# Patient Record
Sex: Male | Born: 1985 | Race: Black or African American | Hispanic: No | Marital: Single | Smoking: Former smoker
Health system: Southern US, Community
[De-identification: ages and names within clinical notes are randomized; demographics above are authoritative.]

## PROBLEM LIST (undated history)

## (undated) DIAGNOSIS — F209 Schizophrenia, unspecified: Secondary | ICD-10-CM

## (undated) DIAGNOSIS — Z21 Asymptomatic human immunodeficiency virus [HIV] infection status: Secondary | ICD-10-CM

## (undated) DIAGNOSIS — B20 Human immunodeficiency virus [HIV] disease: Secondary | ICD-10-CM

---

## 2017-03-07 ENCOUNTER — Emergency Department
Admission: EM | Admit: 2017-03-07 | Discharge: 2017-03-08 | Discharge status: Against Medical Advice | Payer: Self-pay | Attending: Emergency Medicine | Admitting: Emergency Medicine

## 2017-03-07 ENCOUNTER — Encounter: Payer: Self-pay | Admitting: Emergency Medicine

## 2017-03-07 DIAGNOSIS — M6282 Rhabdomyolysis: Secondary | ICD-10-CM | POA: Insufficient documentation

## 2017-03-07 DIAGNOSIS — Z5320 Procedure and treatment not carried out because of patient's decision for unspecified reasons: Secondary | ICD-10-CM | POA: Insufficient documentation

## 2017-03-07 DIAGNOSIS — F17200 Nicotine dependence, unspecified, uncomplicated: Secondary | ICD-10-CM | POA: Insufficient documentation

## 2017-03-07 DIAGNOSIS — B20 Human immunodeficiency virus [HIV] disease: Secondary | ICD-10-CM | POA: Insufficient documentation

## 2017-03-07 HISTORY — DX: Asymptomatic human immunodeficiency virus (hiv) infection status: Z21

## 2017-03-07 HISTORY — DX: Human immunodeficiency virus (HIV) disease: B20

## 2017-03-07 MED ORDER — SODIUM CHLORIDE 0.9 % IV BOLUS (SEPSIS)
1000.0000 mL | Freq: Once | INTRAVENOUS | Status: AC
  Administered 2017-03-08: 1000 mL via INTRAVENOUS

## 2017-03-07 MED ORDER — AMOXICILLIN-POT CLAVULANATE 875-125 MG PO TABS
1.0000 | ORAL_TABLET | Freq: Once | ORAL | Status: AC
  Administered 2017-03-08: 1 via ORAL
  Filled 2017-03-07: qty 1

## 2017-03-07 NOTE — ED Notes (Signed)
ED Provider at bedside. 

## 2017-03-07 NOTE — ED Triage Notes (Signed)
Pt reports he smoked meth on Friday for the first time and since has developed a burn to the right side of upper lip, feeling of increased anxiety and pt sts, "Im just embarrassed of what I did and I am just tripping." Pt reports his "go-to" drugs are marijuana, cocaine and ecstasy. Pt is A&O x4 in triage with steady gait, no SOB or chest pain reported. Pt has swelling tot he right upper lip with white patches.

## 2017-03-08 ENCOUNTER — Encounter: Payer: Self-pay | Admitting: *Deleted

## 2017-03-08 LAB — COMPREHENSIVE METABOLIC PANEL
ALBUMIN: 4.1 g/dL (ref 3.5–5.0)
ALT: 32 U/L (ref 17–63)
ANION GAP: 14 (ref 5–15)
AST: 65 U/L — ABNORMAL HIGH (ref 15–41)
Alkaline Phosphatase: 65 U/L (ref 38–126)
BUN: 27 mg/dL — ABNORMAL HIGH (ref 6–20)
CHLORIDE: 90 mmol/L — AB (ref 101–111)
CO2: 26 mmol/L (ref 22–32)
Calcium: 8.8 mg/dL — ABNORMAL LOW (ref 8.9–10.3)
Creatinine, Ser: 1.6 mg/dL — ABNORMAL HIGH (ref 0.61–1.24)
GFR calc non Af Amer: 57 mL/min — ABNORMAL LOW (ref >60)
GLUCOSE: 79 mg/dL (ref 65–99)
Potassium: 2.9 mmol/L — ABNORMAL LOW (ref 3.5–5.1)
SODIUM: 130 mmol/L — AB (ref 135–145)
Total Bilirubin: 1 mg/dL (ref 0.3–1.2)
Total Protein: 9.7 g/dL — ABNORMAL HIGH (ref 6.5–8.1)

## 2017-03-08 LAB — URINALYSIS, COMPLETE (UACMP) WITH MICROSCOPIC
BACTERIA UA: NONE SEEN
BILIRUBIN URINE: NEGATIVE
GLUCOSE, UA: NEGATIVE mg/dL
Ketones, ur: 20 mg/dL — AB
LEUKOCYTES UA: NEGATIVE
NITRITE: NEGATIVE
PROTEIN: 100 mg/dL — AB
Specific Gravity, Urine: 1.024 (ref 1.005–1.030)
pH: 5 (ref 5.0–8.0)

## 2017-03-08 LAB — CBC
HCT: 44.1 % (ref 40.0–52.0)
Hemoglobin: 14.8 g/dL (ref 13.0–18.0)
MCH: 28.2 pg (ref 26.0–34.0)
MCHC: 33.4 g/dL (ref 32.0–36.0)
MCV: 84.2 fL (ref 80.0–100.0)
PLATELETS: 174 10*3/uL (ref 150–440)
RBC: 5.24 MIL/uL (ref 4.40–5.90)
RDW: 13 % (ref 11.5–14.5)
WBC: 7.7 10*3/uL (ref 3.8–10.6)

## 2017-03-08 LAB — URINE DRUG SCREEN, QUALITATIVE (ARMC ONLY)
AMPHETAMINES, UR SCREEN: POSITIVE — AB
Barbiturates, Ur Screen: NOT DETECTED
Benzodiazepine, Ur Scrn: NOT DETECTED
COCAINE METABOLITE, UR ~~LOC~~: NOT DETECTED
Cannabinoid 50 Ng, Ur ~~LOC~~: POSITIVE — AB
MDMA (ECSTASY) UR SCREEN: NOT DETECTED
METHADONE SCREEN, URINE: NOT DETECTED
Opiate, Ur Screen: NOT DETECTED
Phencyclidine (PCP) Ur S: NOT DETECTED
TRICYCLIC, UR SCREEN: NOT DETECTED

## 2017-03-08 LAB — CK
CK TOTAL: 1187 U/L — AB (ref 49–397)
Total CK: 1689 U/L — ABNORMAL HIGH (ref 49–397)

## 2017-03-08 MED ORDER — SODIUM CHLORIDE 0.9 % IV BOLUS (SEPSIS)
2000.0000 mL | Freq: Once | INTRAVENOUS | Status: AC
  Administered 2017-03-08: 2000 mL via INTRAVENOUS

## 2017-03-08 MED ORDER — POTASSIUM CHLORIDE 20 MEQ PO PACK
40.0000 meq | PACK | Freq: Two times a day (BID) | ORAL | Status: DC
  Administered 2017-03-08: 40 meq via ORAL
  Filled 2017-03-08: qty 2

## 2017-03-08 MED ORDER — AMOXICILLIN-POT CLAVULANATE 875-125 MG PO TABS: 1.0000 | ORAL_TABLET | Freq: Two times a day (BID) | ORAL | 0 refills | Status: AC

## 2017-03-08 NOTE — ED Notes (Signed)
This RN in with Dr Manson PasseyBrown to see pt. We are attempting to have pt stay. Pt wants mother in the room. This RN runs to the parking lot to bring mom in to help persuade pt to stay. Pt continues to state he wants to leave. Agrees to let me put 2 more liters in his IV on a pressure bag and then redraw his blood.

## 2017-03-08 NOTE — ED Provider Notes (Signed)
[facility] Emergency Department Provider Note    First MD Initiated Contact with Patient 03/07/17 2326     (approximate)  I have reviewed the triage vital signs and the nursing notes.   HISTORY  Chief Complaint Anxiety    HPI Michael Chandler is a 31 y.o. male with history of HIV and polysubstance abuse presents to the emergency department with generalized muscle aches, burn to the upper right lip, feelings of anxiety.  Patient states symptoms began after smoking methamphetamines as well as IV injection of methamphetamines.  Patient admits to nausea and vomiting with inability to tolerate eating or drinking.  Patient denies any abdominal discomfort or diarrhea.  Patient denies any fever afebrile on presentation temperature 98   Past Medical History:  Diagnosis Date  . HIV (human immunodeficiency virus infection) (HCC)     There are no active problems to display for this patient.  Past surgical history None  Prior to Admission medications   Not on File    Allergies No known drug allergies History reviewed. No pertinent family history.  Social History Social History   Tobacco Use  . Smoking status: Current Every Day Smoker  . Smokeless tobacco: Never Used  Substance Use Topics  . Alcohol use: Yes  . Drug use: Yes    Types: Methamphetamines, Marijuana, Cocaine    Review of Systems Constitutional: No fever/chills Eyes: No visual changes. ENT: No sore throat. Cardiovascular: Denies chest pain. Respiratory: Denies shortness of breath. Gastrointestinal: No abdominal pain.  No nausea, no vomiting.  No diarrhea.  No constipation. Genitourinary: Negative for dysuria. Musculoskeletal: Positive for generalized muscle aches Integumentary: Negative for rash.  Positive for burn to the upper lip Neurological: Negative for headaches, focal weakness or numbness.  ____________________________________________   PHYSICAL EXAM:  VITAL SIGNS: ED  Triage Vitals  Enc Vitals Group     BP 03/07/17 2055 137/84     Pulse Rate 03/07/17 2055 100     Resp 03/07/17 2055 17     Temp 03/07/17 2055 98 F (36.7 C)     Temp Source 03/07/17 2055 Oral     SpO2 03/07/17 2055 100 %     Weight 03/07/17 2056 91.4 kg (201 lb 9.6 oz)     Height 03/07/17 2056 1.93 m (6\' 4" )     Head Circumference --      Peak Flow --      Pain Score --      Pain Loc --      Pain Edu? --      Excl. in GC? --     Constitutional: Alert and oriented. Well appearing and in no acute distress. Eyes: Conjunctivae are normal.  Head: Atraumatic. Mouth/Throat: First-degree burn noted to the right side of the upper lip oropharynx non-erythematous. Neck: No stridor.   Cardiovascular: Normal rate, regular rhythm. Good peripheral circulation. Grossly normal heart sounds. Respiratory: Normal respiratory effort.  No retractions. Lungs CTAB. Gastrointestinal: Soft and nontender. No distention.  Musculoskeletal: No lower extremity tenderness nor edema. No gross deformities of extremities. Neurologic:  Normal speech and language. No gross focal neurologic deficits are appreciated.  Skin:  Skin is warm, dry and intact. No rash noted. Psychiatric: Mood and affect are normal. Speech and behavior are normal.  ____________________________________________   LABS (all labs ordered are listed, but only abnormal results are displayed)  Labs Reviewed  COMPREHENSIVE METABOLIC PANEL - Abnormal; Notable for the following components:      Result Value  Sodium 130 (*)    Potassium 2.9 (*)    Chloride 90 (*)    BUN 27 (*)    Creatinine, Ser 1.60 (*)    Calcium 8.8 (*)    Total Protein 9.7 (*)    AST 65 (*)    GFR calc non Af Amer 57 (*)    All other components within normal limits  CK - Abnormal; Notable for the following components:   Total CK 1,689 (*)    All other components within normal limits  URINE DRUG SCREEN, QUALITATIVE ([facility] ONLY) - Abnormal; Notable for the following  components:   Amphetamines, Ur Screen POSITIVE (*)    Cannabinoid 50 Ng, Ur [place] POSITIVE (*)    All other components within normal limits  URINALYSIS, COMPLETE (UACMP) WITH MICROSCOPIC - Abnormal; Notable for the following components:   Color, Urine AMBER (*)    APPearance CLOUDY (*)    Hgb urine dipstick SMALL (*)    Ketones, ur 20 (*)    Protein, ur 100 (*)    Squamous Epithelial / LPF 0-5 (*)    All other components within normal limits  CK - Abnormal; Notable for the following components:   Total CK 1,187 (*)    All other components within normal limits  CBC     Procedures   ____________________________________________   INITIAL IMPRESSION / ASSESSMENT AND PLAN / ED COURSE  As part of my medical decision making, I reviewed the following data within the electronic MEDICAL RECORD [id number] year old male presented with above-stated history and physical exam.  Concern for possible rhabdomyolysis given onset of symptoms after IV methamphetamine use.  As such laboratory data including CK was obtained which revealed a CK of 1689.  Patient's creatinine 1.60.  I spoke with the patient and his mother at length regarding all clinical findings.  Patient refuses to be admitted to the hospital and as such 4 L of IV normal saline was given in the emergency department with repeat creatinine kinase level obtained which was 1187.  I spoke with the patient at length in the presence of his mother (patient gave permission) and informed him at length the need for hospital admission and the potential risk if he were to leave AGAINST MEDICAL ADVICE.  Patient adamantly refused to be admitted and requested to leave AGAINST MEDICAL ADVICE.     ____________________________________________  FINAL CLINICAL IMPRESSION(S) / ED DIAGNOSES  Final diagnoses:  Non-traumatic rhabdomyolysis     MEDICATIONS GIVEN DURING THIS VISIT:  Medications  potassium chloride (KLOR-CON) packet 40 mEq (40 mEq Oral Given 03/08/17  0228)  sodium chloride 0.9 % bolus 1,000 mL (0 mLs Intravenous Stopped 03/08/17 0220)  sodium chloride 0.9 % bolus 1,000 mL (0 mLs Intravenous Stopped 03/08/17 0040)  amoxicillin-clavulanate (AUGMENTIN) 875-125 MG per tablet 1 tablet (1 tablet Oral Given 03/08/17 0014)  sodium chloride 0.9 % bolus 2,000 mL (2,000 mLs Intravenous New Bag/Given 03/08/17 0150)     ED Discharge Orders    None       Note:  This document was prepared using Dragon voice recognition software and may include unintentional dictation errors.    Darci CurrentBrown, Norge N, MD 03/08/17 [phone]

## 2017-03-08 NOTE — ED Notes (Signed)
Pt told by this RN and Dr Manson PasseyBrown all the risks leaving AMA. Told pt he should be admitted to hospital. Pt verbalizes understanding the risks for leaving AMA. Mother present. She is unable to persuade him as well. Pt continues to state he wants to leave AMA.

## 2021-09-29 ENCOUNTER — Other Ambulatory Visit: Payer: Self-pay

## 2021-09-29 ENCOUNTER — Emergency Department: Payer: Self-pay

## 2021-09-29 ENCOUNTER — Emergency Department
Admission: EM | Admit: 2021-09-29 | Discharge: 2021-09-30 | Disposition: A | Discharge status: Home / Self Care | Payer: Self-pay | Attending: Emergency Medicine | Admitting: Emergency Medicine

## 2021-09-29 DIAGNOSIS — F209 Schizophrenia, unspecified: Secondary | ICD-10-CM | POA: Insufficient documentation

## 2021-09-29 DIAGNOSIS — E876 Hypokalemia: Secondary | ICD-10-CM

## 2021-09-29 DIAGNOSIS — M542 Cervicalgia: Secondary | ICD-10-CM | POA: Insufficient documentation

## 2021-09-29 DIAGNOSIS — B20 Human immunodeficiency virus [HIV] disease: Secondary | ICD-10-CM

## 2021-09-29 DIAGNOSIS — M5126 Other intervertebral disc displacement, lumbar region: Secondary | ICD-10-CM

## 2021-09-29 DIAGNOSIS — Z21 Asymptomatic human immunodeficiency virus [HIV] infection status: Secondary | ICD-10-CM | POA: Insufficient documentation

## 2021-09-29 DIAGNOSIS — F319 Bipolar disorder, unspecified: Secondary | ICD-10-CM | POA: Insufficient documentation

## 2021-09-29 LAB — CBC WITH DIFFERENTIAL/PLATELET
Abs Immature Granulocytes: 0.04 10*3/uL (ref 0.00–0.07)
Basophils Absolute: 0 10*3/uL (ref 0.0–0.1)
Basophils Relative: 0 %
Eosinophils Absolute: 0.1 10*3/uL (ref 0.0–0.5)
Eosinophils Relative: 2 %
HCT: 35.8 % — ABNORMAL LOW (ref 39.0–52.0)
Hemoglobin: 11.2 g/dL — ABNORMAL LOW (ref 13.0–17.0)
Immature Granulocytes: 1 %
Lymphocytes Relative: 20 %
Lymphs Abs: 1.1 10*3/uL (ref 0.7–4.0)
MCH: 26.7 pg (ref 26.0–34.0)
MCHC: 31.3 g/dL (ref 30.0–36.0)
MCV: 85.4 fL (ref 80.0–100.0)
Monocytes Absolute: 0.8 10*3/uL (ref 0.1–1.0)
Monocytes Relative: 15 %
Neutro Abs: 3.4 10*3/uL (ref 1.7–7.7)
Neutrophils Relative %: 62 %
Platelets: 290 10*3/uL (ref 150–400)
RBC: 4.19 MIL/uL — ABNORMAL LOW (ref 4.22–5.81)
RDW: 14.5 % (ref 11.5–15.5)
WBC: 5.4 10*3/uL (ref 4.0–10.5)
nRBC: 0 % (ref 0.0–0.2)

## 2021-09-29 LAB — COMPREHENSIVE METABOLIC PANEL
ALT: 21 U/L (ref 0–44)
AST: 29 U/L (ref 15–41)
Albumin: 3.5 g/dL (ref 3.5–5.0)
Alkaline Phosphatase: 102 U/L (ref 38–126)
Anion gap: 6 (ref 5–15)
BUN: 11 mg/dL (ref 6–20)
CO2: 24 mmol/L (ref 22–32)
Calcium: 8.7 mg/dL — ABNORMAL LOW (ref 8.9–10.3)
Chloride: 107 mmol/L (ref 98–111)
Creatinine, Ser: 1 mg/dL (ref 0.61–1.24)
GFR, Estimated: >60 mL/min (ref >60)
Glucose, Bld: 108 mg/dL — ABNORMAL HIGH (ref 70–99)
Potassium: 3 mmol/L — ABNORMAL LOW (ref 3.5–5.1)
Sodium: 137 mmol/L (ref 135–145)
Total Bilirubin: 0.8 mg/dL (ref 0.3–1.2)
Total Protein: 7.8 g/dL (ref 6.5–8.1)

## 2021-09-29 LAB — PROCALCITONIN: Procalcitonin: <0.1 ng/mL

## 2021-09-29 LAB — SEDIMENTATION RATE: Sed Rate: 52 mm/hr — ABNORMAL HIGH (ref 0–15)

## 2021-09-29 IMAGING — MR MR THORACIC SPINE WO/W CM
7 of 10 series · 27 of 48 positions shown · IV contrast (9ml Gadavist)
Comparison: None Available.

CLINICAL DATA: Altered mental status.  History of epidural abscess.

EXAM:
MRI CERVICAL, THORACIC AND LUMBAR SPINE WITHOUT AND WITH CONTRAST
TECHNIQUE: Multiplanar and multiecho pulse sequences of the cervical spine, to
include the craniocervical junction and cervicothoracic junction,
and thoracic and lumbar spine, were obtained without and with
intravenous contrast.
CONTRAST:  9mL GADAVIST GADOBUTROL 1 MMOL/ML IV SOLN

[Series 45: T1 · sagittal · 5.0mm · 1.88mm/px · 1 of 9 slices shown (1 of 3)]
[im 1/9]
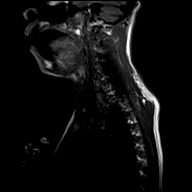

[Series 47: T2 · sagittal · 3.0mm · 1.12mm/px · 3 of 19 slices shown (1 of 2)]
[im 1/19]
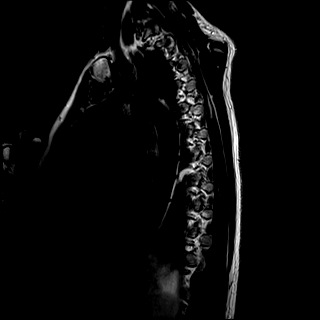
[im 10/19]
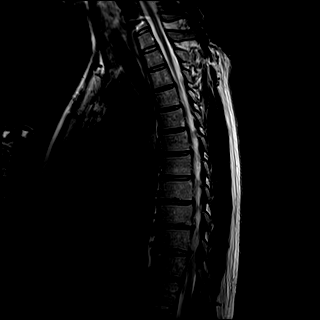
[im 19/19]
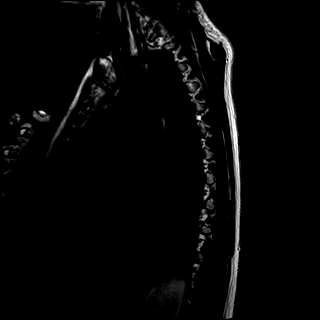

[Series 48: T1 · sagittal · 3.0mm · 1.12mm/px · 3 of 19 slices shown (2 of 3)]
[im 1/19]
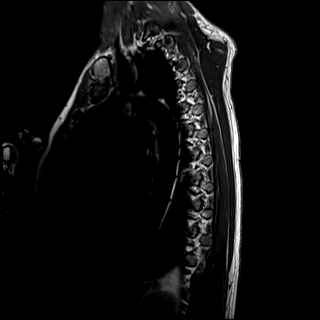
[im 10/19]
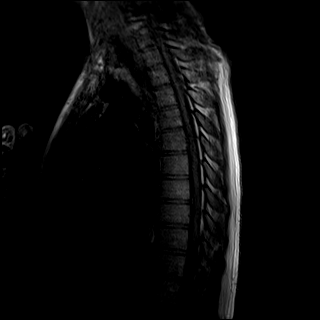
[im 19/19]
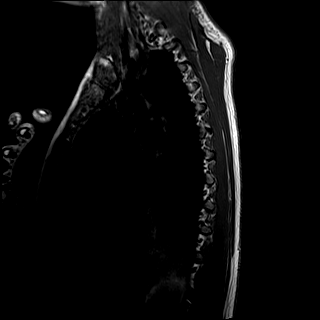

[Series 49: STIR · sagittal · 3.0mm · 0.56mm/px · 3 of 19 slices shown]
[im 1/19]
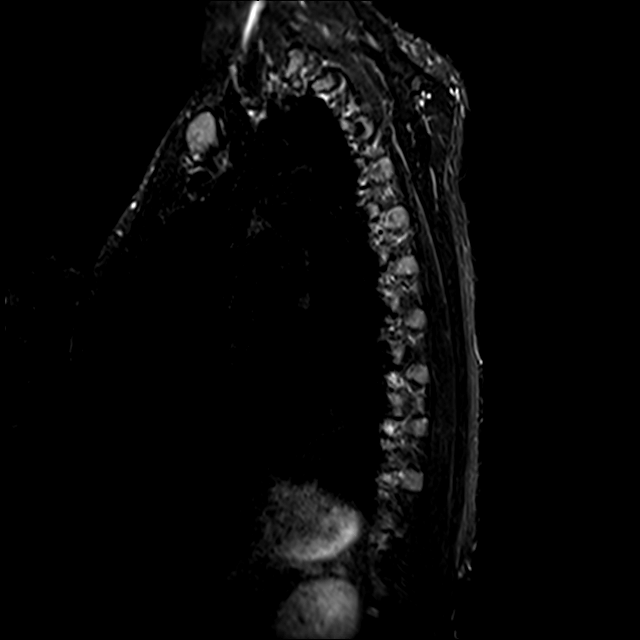
[im 10/19]
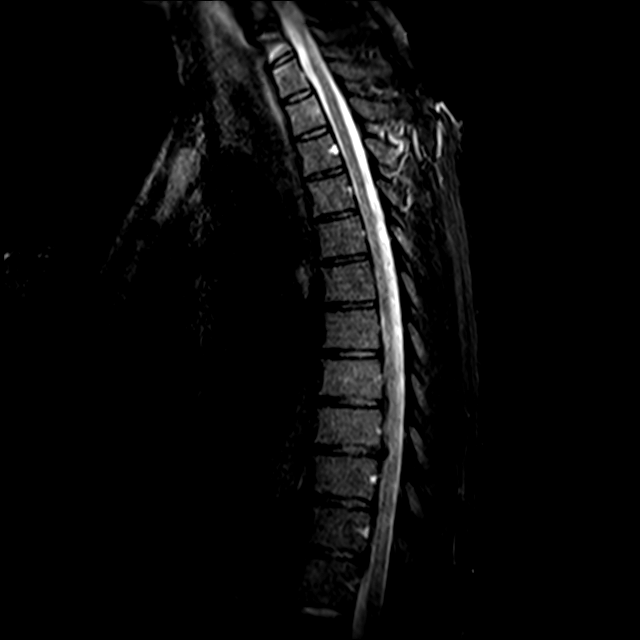
[im 19/19]
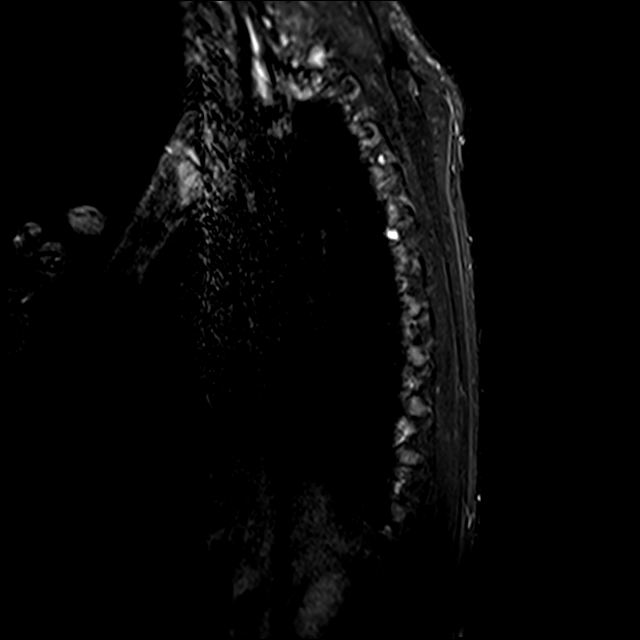

[Series 52: T2 · axial · 4.0mm · 0.59mm/px · z∈[-441,-192]mm · 7 of 39 slices shown (2 of 2)]
[im 1/39]
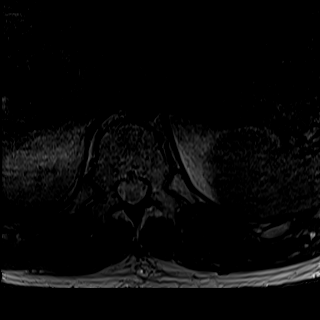
[im 7/39]
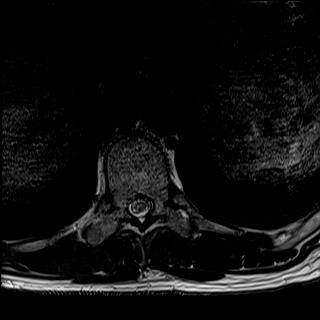
[im 13/39]
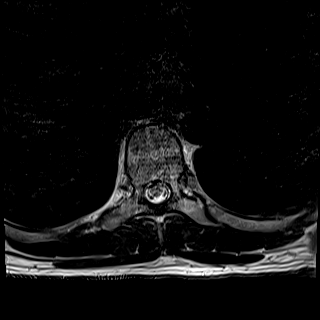
[im 20/39]
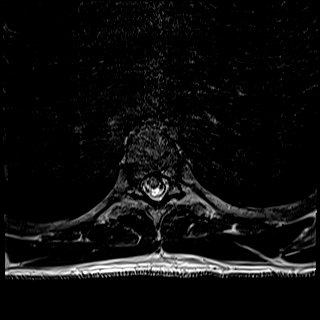
[im 26/39]
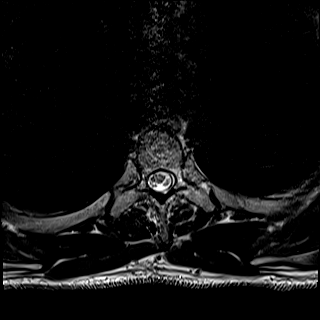
[im 32/39]
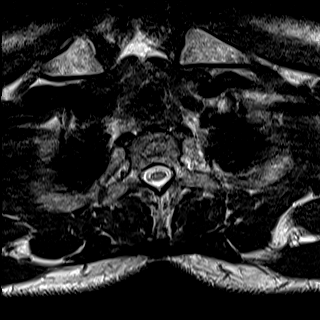
[im 39/39]
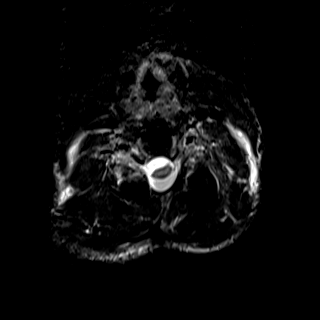

[Series 53: T1 · axial · non-contrast · 4.0mm · 0.37mm/px · z∈[-441,-192]mm · 7 of 39 slices shown (3 of 3)]
[im 1/39]
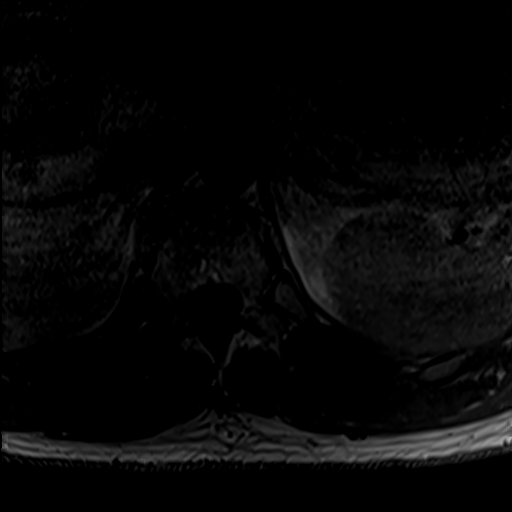
[im 7/39]
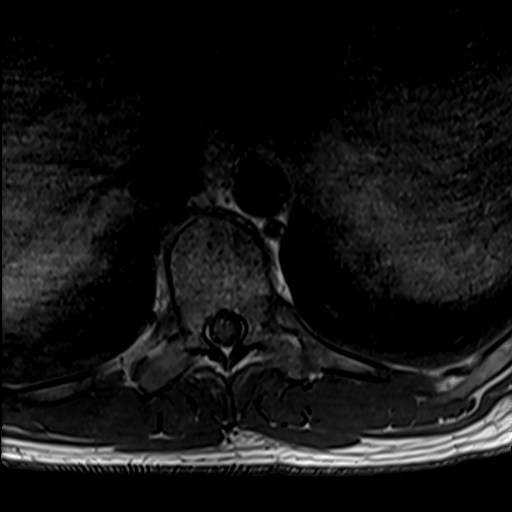
[im 13/39]
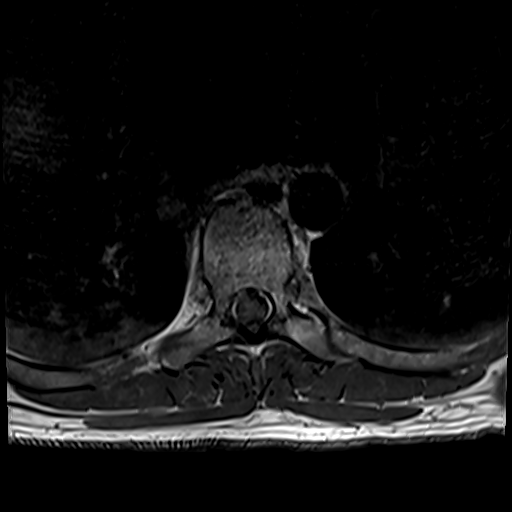
[im 20/39]
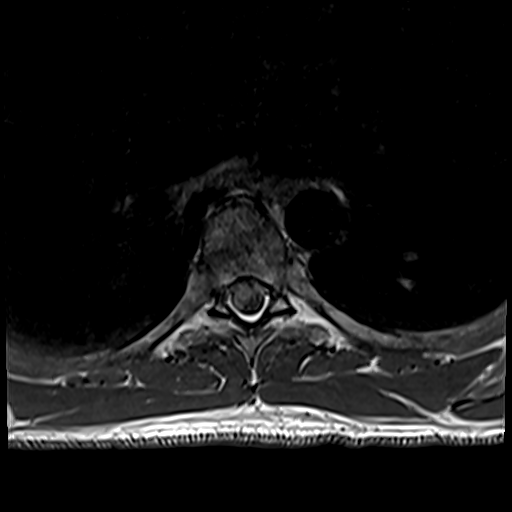
[im 26/39]
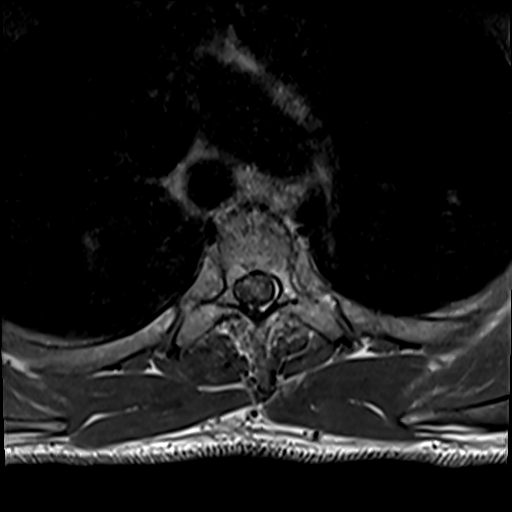
[im 32/39]
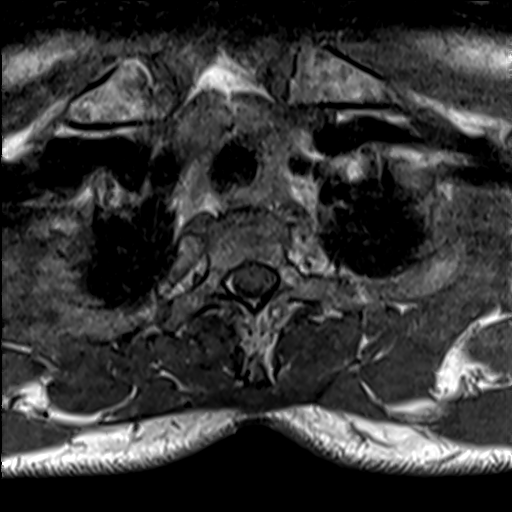
[im 39/39]
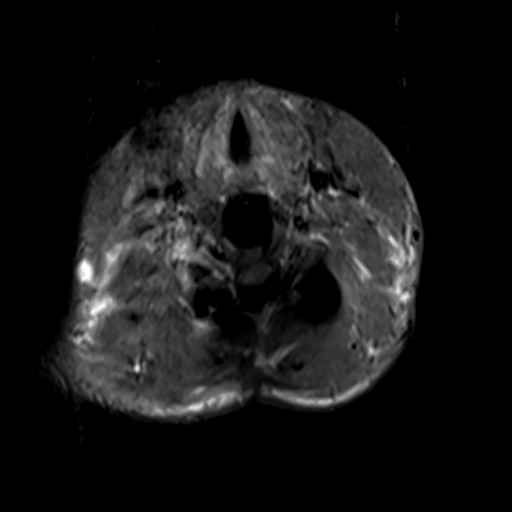

[Series 70: T1 fat-sat post-contrast · sagittal · 3.0mm · 1.06mm/px · 3 of 19 slices shown]
[im 1/19]
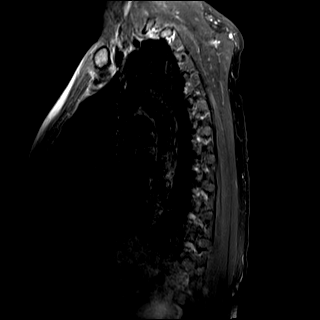
[im 10/19]
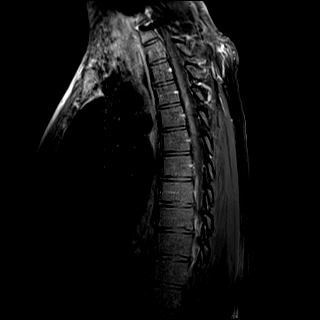
[im 19/19]
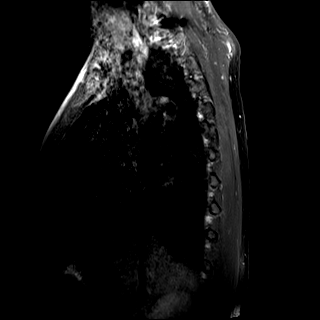

[27 of 48 positions shown; findings below may reference images not displayed]

FINDINGS: MRI CERVICAL SPINE FINDINGS

Alignment: Normal

Vertebrae: Status post C5-6 corpectomy with posterior decompression
at the C4-7 levels. There is anterior fusion at C4-7 and posterior
fusion from C2-T1.

Cord: There is mild cord volume loss at the postsurgical levels but
no abnormal signal. The left half of the spinal cord is deviated
anteriorly at the C6 level.

Posterior Fossa, vertebral arteries, paraspinal tissues:
Unremarkable

Disc levels:

The spinal canal is widely patent and there is no neural foraminal
stenosis. No epidural abscess or other fluid collection.

MRI THORACIC SPINE FINDINGS

Alignment:  Physiologic.

Vertebrae: No fracture, evidence of discitis, or bone lesion.

Cord:  Normal signal and morphology.  No epidural collection.

Paraspinal and other soft tissues: Negative

Disc levels:

No spinal canal stenosis.

MRI LUMBAR SPINE FINDINGS

Segmentation:  Standard.

Alignment:  Physiologic.

Vertebrae:  No fracture, evidence of discitis, or bone lesion.

Conus medullaris and cauda equina: Conus extends to the L1 level.
Conus and cauda equina appear normal. No abnormal contrast
enhancement. No epidural collection.

Paraspinal and other soft tissues: Normal

Disc levels:

L1-L2: Normal disc space and facet joints. No spinal canal stenosis.
No neural foraminal stenosis.

L2-L3: Medium-sized central disc protrusion causing mild spinal
canal stenosis with narrowing of both lateral recesses. No neural
foraminal stenosis.

L3-L4: Normal disc space and facet joints. No spinal canal stenosis.
No neural foraminal stenosis.

L4-L5: Mild disc bulge. No spinal canal stenosis. No neural
foraminal stenosis.

L5-S1: Normal disc space and facet joints. No spinal canal stenosis.
No neural foraminal stenosis.

Visualized sacrum: Normal.
IMPRESSION: 1. No discitis osteomyelitis or epidural abscess.
2. Postsurgical changes of the cervical spine with mild cord volume
loss at the postsurgical levels. Mild anterior deviation of the left
hemicord at the C6 level is likely postsurgical and could indicate
presence of a small arachnoid web.
3. Medium-sized central disc protrusion at L2-3 causing mild spinal
canal stenosis and narrowing of both lateral recesses.

## 2021-09-29 IMAGING — MR MR HEAD WO/W CM
15 series · 48 of 48 positions shown · IV contrast (gadavist)
Comparison: None Available.

CLINICAL DATA: Altered mental status. History of IV drug use and
epidural abscess.

EXAM:
MRI HEAD WITHOUT AND WITH CONTRAST
TECHNIQUE: Multiplanar, multiecho pulse sequences of the brain and surrounding
structures were obtained without and with intravenous contrast.
CONTRAST:  9mL GADAVIST GADOBUTROL 1 MMOL/ML IV SOLN

[Series 5: ax dwi_tracew · axial · 3.0mm · 0.65mm/px · z∈[-57,+97]mm · 2 of 48 slices shown (1 of 2)]
[im 1/48]
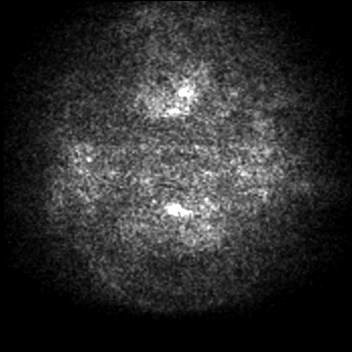
[im 48/48]
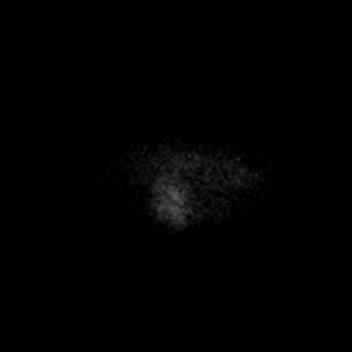

[Series 6: ax dwi_adc · axial · 3.0mm · 0.65mm/px · z∈[-57,+97]mm · 3 of 48 slices shown (1 of 2)]
[im 1/48]
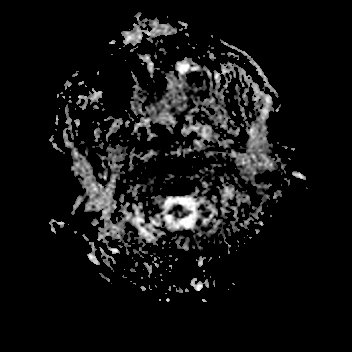
[im 24/48]
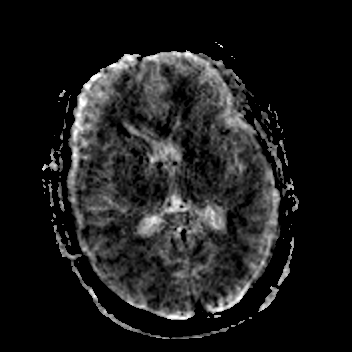
[im 48/48]
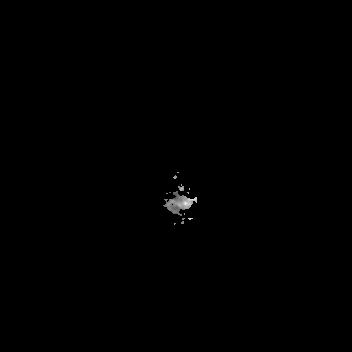

[Series 7: ax dwi_tracew · axial · 3.0mm · 1.80mm/px · z∈[-57,+97]mm · 3 of 48 slices shown (2 of 2)]
[im 1/48]
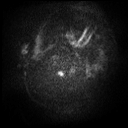
[im 24/48]
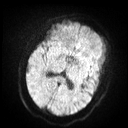
[im 48/48]
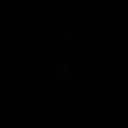

[Series 8: ax dwi_adc · axial · 3.0mm · 1.80mm/px · z∈[-57,+94]mm · 3 of 47 slices shown (2 of 2)]
[im 1/47]
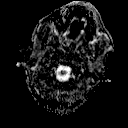
[im 24/47]
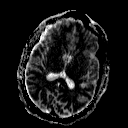
[im 47/47]
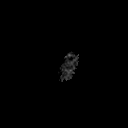

[Series 9: cor dwi_tracew · coronal · 5.0mm · 1.80mm/px · 2 of 40 slices shown]
[im 1/40]
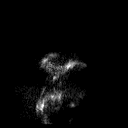
[im 40/40]
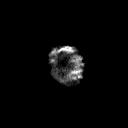

[Series 10: cor dwi_adc · coronal · 5.0mm · 1.80mm/px · 2 of 40 slices shown]
[im 1/40]
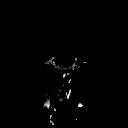
[im 40/40]
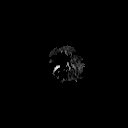

[Series 11: T1 · sagittal · 5.0mm · 0.62mm/px · 1 of 23 slices shown (1 of 3)]
[im 1/23]
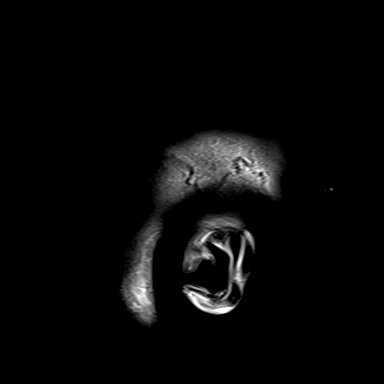

[Series 13: pha_images · axial · 3.0mm · 0.90mm/px · z∈[-54,+92]mm · 3 of 49 slices shown]
[im 1/49]
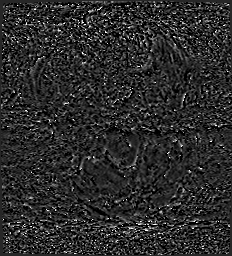
[im 25/49]
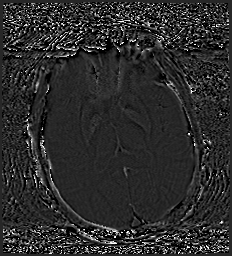
[im 49/49]
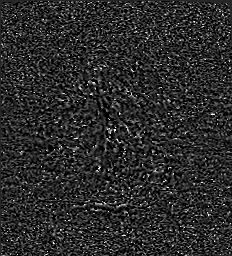

[Series 14: swi_images · axial · 3.0mm · 0.90mm/px · z∈[-57,+95]mm · 3 of 52 slices shown]
[im 1/52]
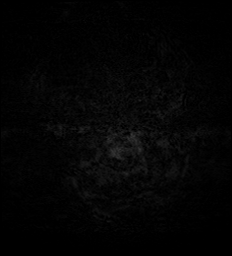
[im 26/52]
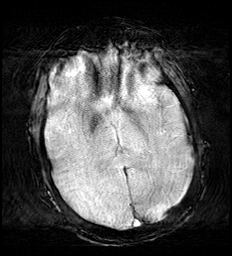
[im 52/52]
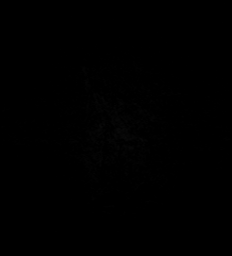

[Series 16: T2 · axial · 5.0mm · 0.86mm/px · 1 of 27 slices shown (1 of 2)]
[im 1/27]
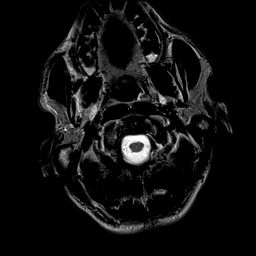

[Series 17: FLAIR · axial · 3.0mm · 0.69mm/px · z∈[-60,+101]mm · 3 of 55 slices shown]
[im 1/55]
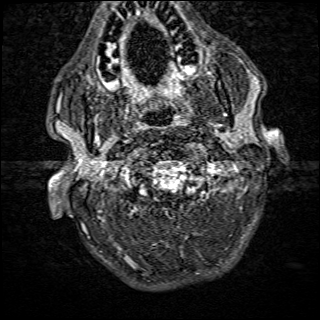
[im 28/55]
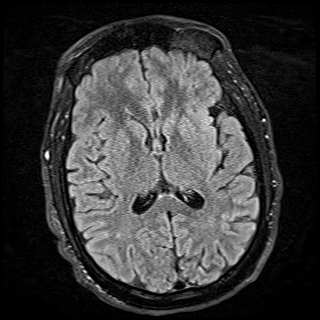
[im 55/55]
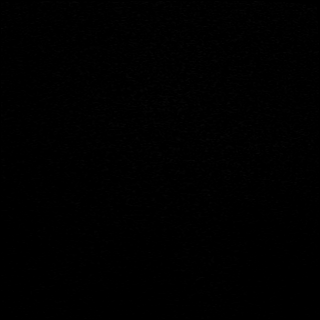

[Series 18: T2 · coronal · 5.0mm · 0.86mm/px · 2 of 29 slices shown (2 of 2)]
[im 1/29]
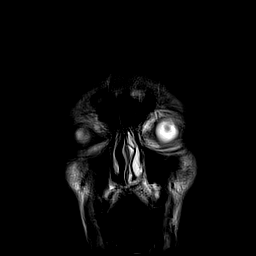
[im 29/29]
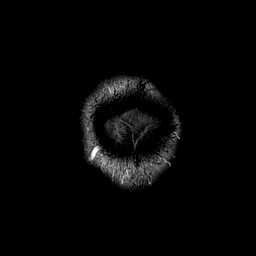

[Series 63: T1 · axial · 1.0mm · 0.98mm/px · z∈[-68,+105]mm · 9 of 174 slices shown (2 of 3)]
[im 1/174]
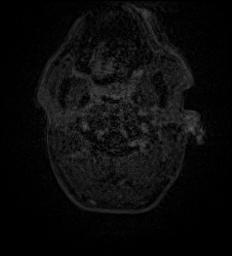
[im 22/174]
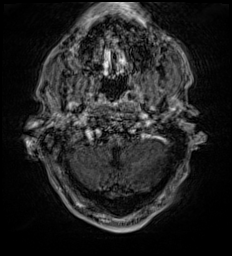
[im 44/174]
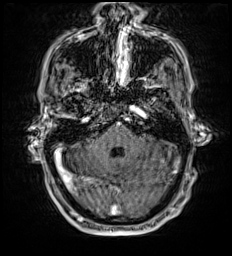
[im 65/174]
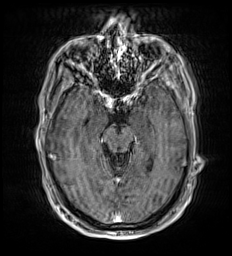
[im 87/174]
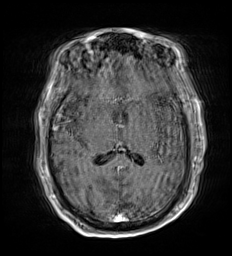
[im 109/174]
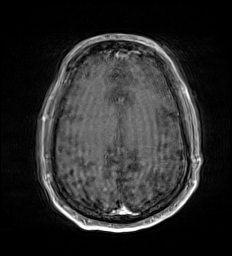
[im 130/174]
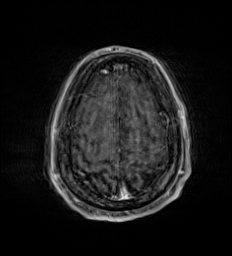
[im 152/174]
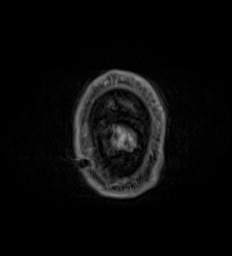
[im 174/174]
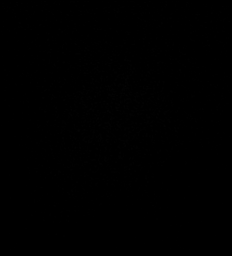

[Series 64: T1 post-contrast · coronal · 5.0mm · 0.90mm/px · 2 of 31 slices shown]
[im 1/31]
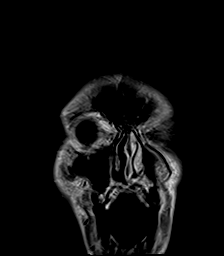
[im 31/31]
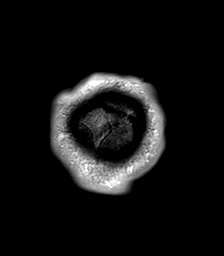

[Series 65: T1 · axial · 1.0mm · 0.98mm/px · z∈[-68,+103]mm · 9 of 173 slices shown (3 of 3)]
[im 1/173]
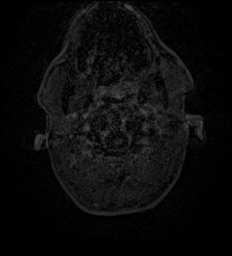
[im 22/173]
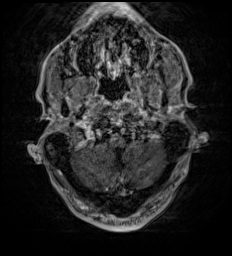
[im 44/173]
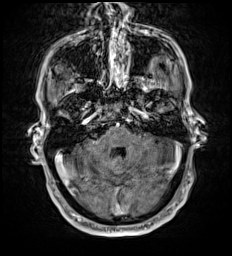
[im 65/173]
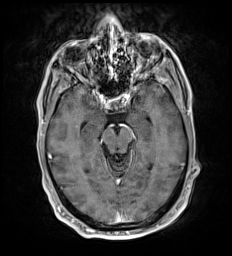
[im 87/173]
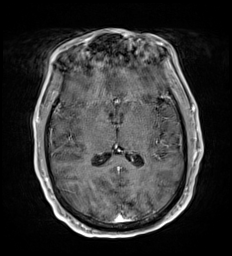
[im 108/173]
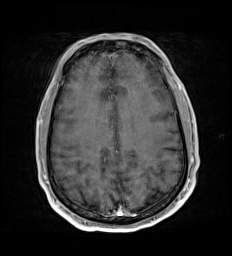
[im 130/173]
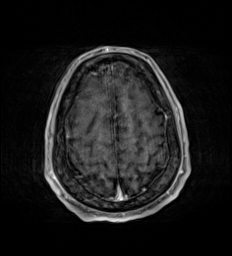
[im 151/173]
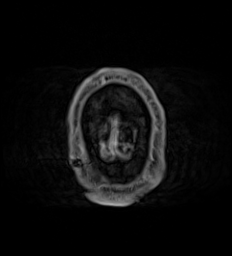
[im 173/173]
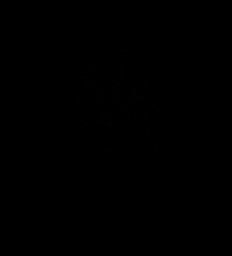

[48 of 48 positions shown; findings below may reference images not displayed]

FINDINGS: Brain: No acute infarct, mass effect or extra-axial collection. No
acute or chronic hemorrhage. Single nonspecific focus of
hyperintense T2-weighted signal the lower right frontal white
matter. Brain parenchymal signal otherwise normal. Normal CSF
spaces. The midline structures are normal. There is no abnormal
contrast enhancement.

Vascular: Major flow voids are preserved.

Skull and upper cervical spine: Normal calvarium and skull base.
Visualized upper cervical spine and soft tissues are normal.

Sinuses/Orbits:No paranasal sinus fluid levels or advanced mucosal
thickening. No mastoid or middle ear effusion. Normal orbits.
IMPRESSION: Normal brain MRI.

## 2021-09-29 IMAGING — MR MR LUMBAR SPINE WO/W CM
6 of 7 series · 31 of 48 positions shown · IV contrast (9ml Gadavist)
Comparison: None Available.

CLINICAL DATA: Altered mental status.  History of epidural abscess.

EXAM:
MRI CERVICAL, THORACIC AND LUMBAR SPINE WITHOUT AND WITH CONTRAST
TECHNIQUE: Multiplanar and multiecho pulse sequences of the cervical spine, to
include the craniocervical junction and cervicothoracic junction,
and thoracic and lumbar spine, were obtained without and with
intravenous contrast.
CONTRAST:  9mL GADAVIST GADOBUTROL 1 MMOL/ML IV SOLN

[Series 58: T2 · sagittal · 4.0mm · 0.81mm/px · 5 of 17 slices shown (1 of 2)]
[im 1/17]
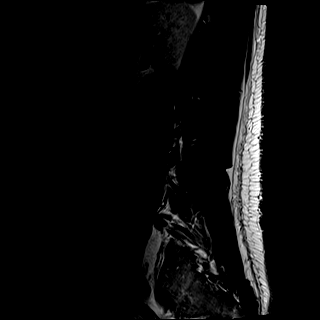
[im 5/17]
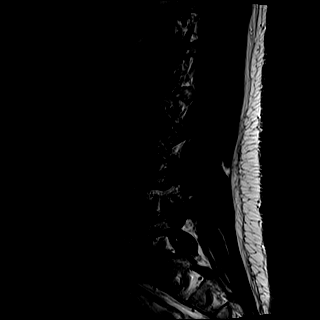
[im 9/17]
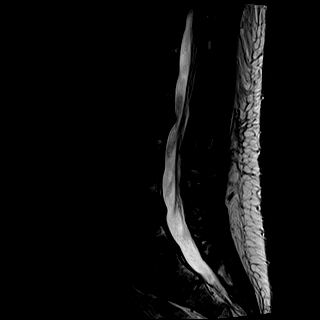
[im 13/17]
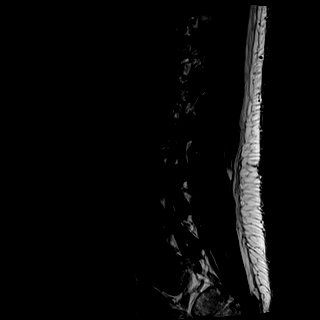
[im 17/17]
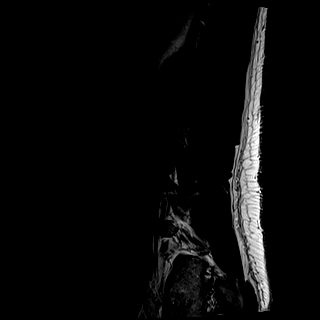

[Series 59: T1 · sagittal · 4.0mm · 0.81mm/px · 5 of 17 slices shown (1 of 2)]
[im 1/17]
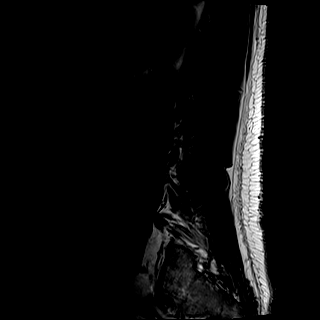
[im 5/17]
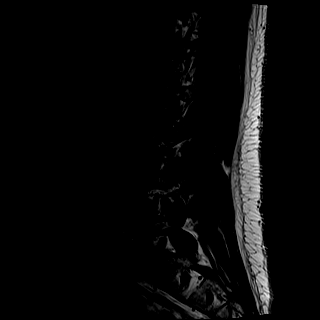
[im 9/17]
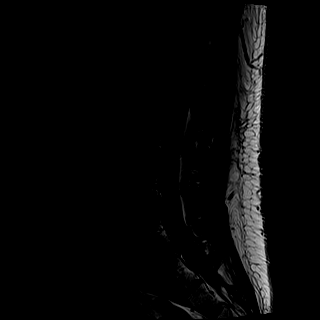
[im 13/17]
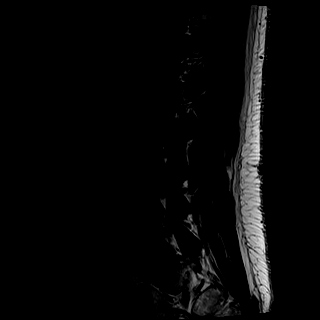
[im 17/17]
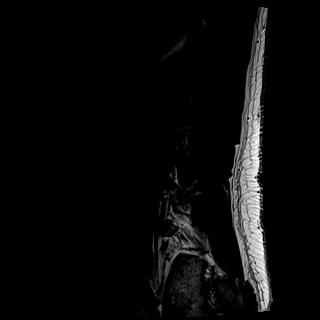

[Series 60: STIR · sagittal · 4.0mm · 0.41mm/px · 1 of 17 slices shown]
[im 1/17]
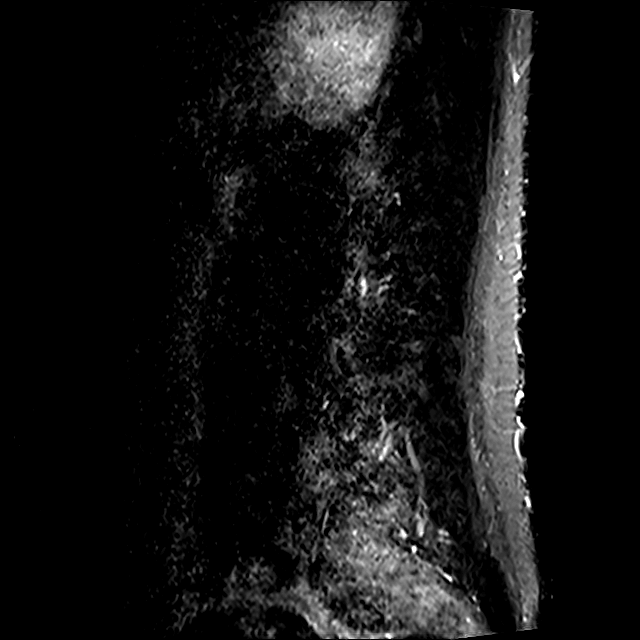

[Series 61: T2 · axial · 4.0mm · 0.78mm/px · z∈[-691,-462]mm · 8 of 40 slices shown (2 of 2)]
[im 1/40]
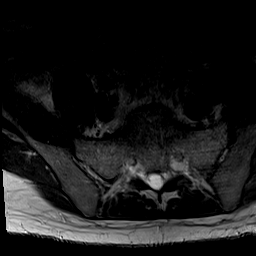
[im 5/40]
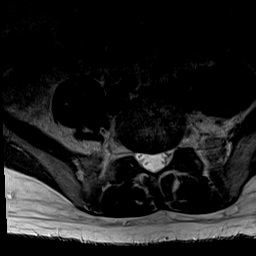
[im 14/40]
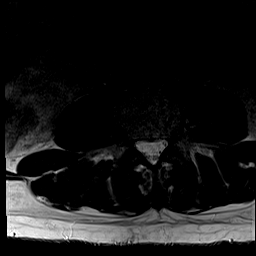
[im 18/40]
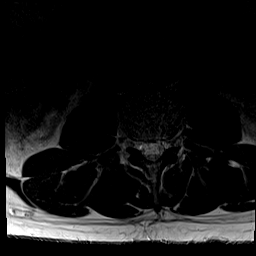
[im 22/40]
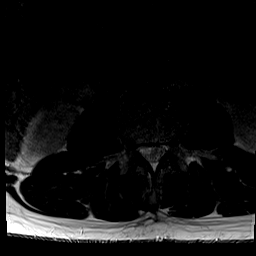
[im 27/40]
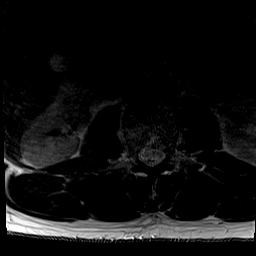
[im 35/40]
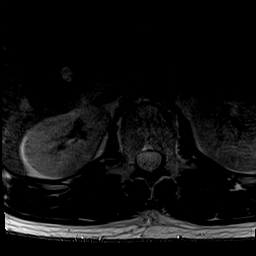
[im 40/40]
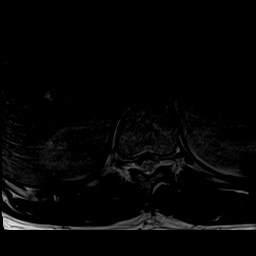

[Series 62: T1 · axial · 4.0mm · 0.39mm/px · z∈[-691,-462]mm · 8 of 40 slices shown (2 of 2)]
[im 1/40]
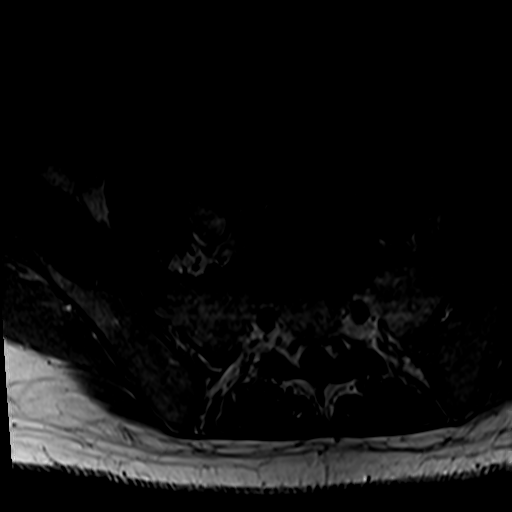
[im 5/40]
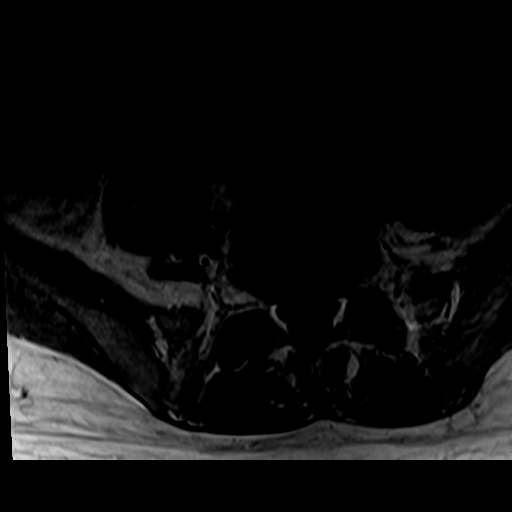
[im 14/40]
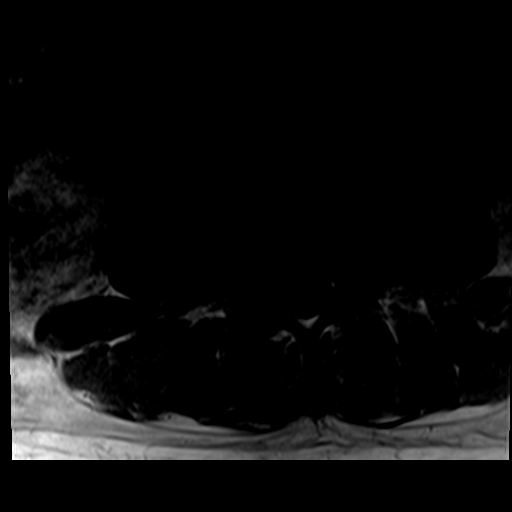
[im 18/40]
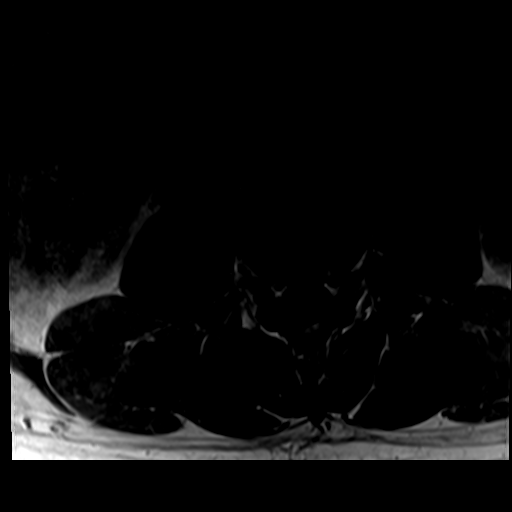
[im 22/40]
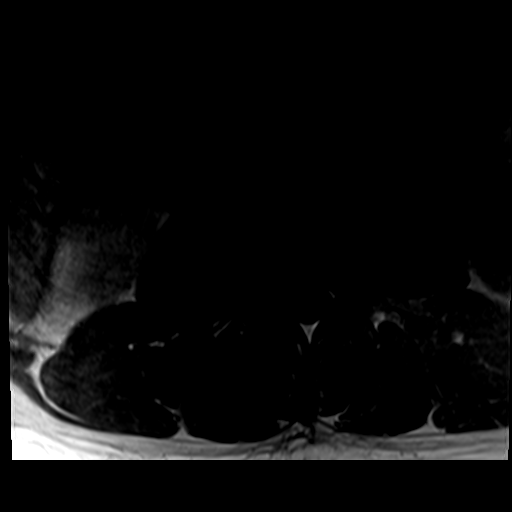
[im 27/40]
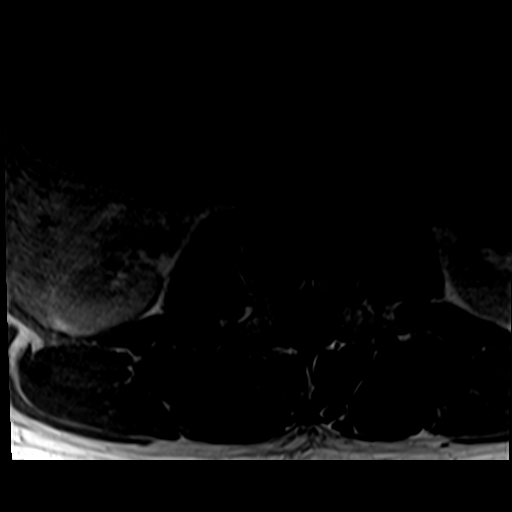
[im 35/40]
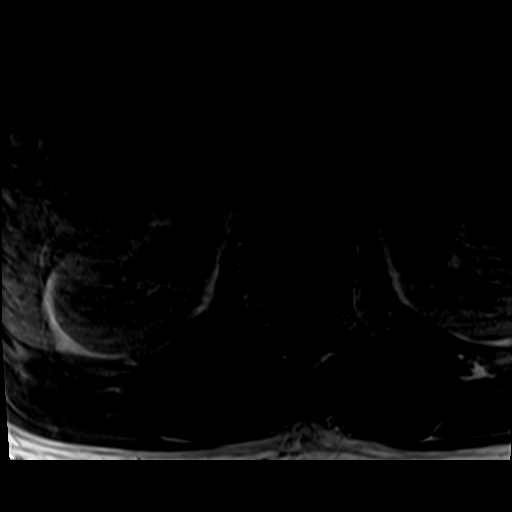
[im 40/40]
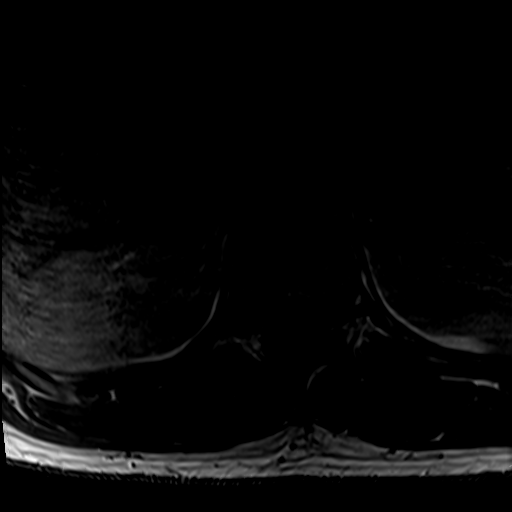

[Series 72: T1 fat-sat post-contrast · sagittal · 4.0mm · 0.81mm/px · 4 of 17 slices shown]
[im 1/17]
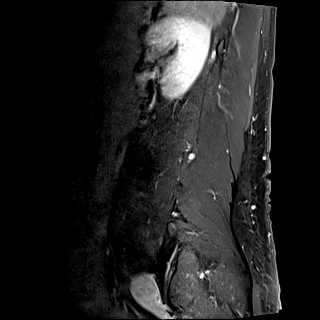
[im 6/17]
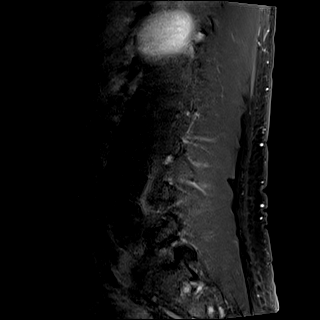
[im 11/17]
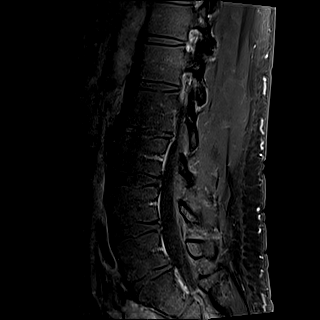
[im 17/17]
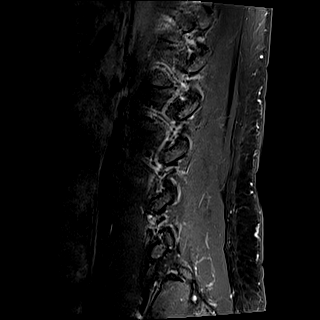

[31 of 48 positions shown; findings below may reference images not displayed]

FINDINGS: MRI CERVICAL SPINE FINDINGS

Alignment: Normal

Vertebrae: Status post C5-6 corpectomy with posterior decompression
at the C4-7 levels. There is anterior fusion at C4-7 and posterior
fusion from C2-T1.

Cord: There is mild cord volume loss at the postsurgical levels but
no abnormal signal. The left half of the spinal cord is deviated
anteriorly at the C6 level.

Posterior Fossa, vertebral arteries, paraspinal tissues:
Unremarkable

Disc levels:

The spinal canal is widely patent and there is no neural foraminal
stenosis. No epidural abscess or other fluid collection.

MRI THORACIC SPINE FINDINGS

Alignment:  Physiologic.

Vertebrae: No fracture, evidence of discitis, or bone lesion.

Cord:  Normal signal and morphology.  No epidural collection.

Paraspinal and other soft tissues: Negative

Disc levels:

No spinal canal stenosis.

MRI LUMBAR SPINE FINDINGS

Segmentation:  Standard.

Alignment:  Physiologic.

Vertebrae:  No fracture, evidence of discitis, or bone lesion.

Conus medullaris and cauda equina: Conus extends to the L1 level.
Conus and cauda equina appear normal. No abnormal contrast
enhancement. No epidural collection.

Paraspinal and other soft tissues: Normal

Disc levels:

L1-L2: Normal disc space and facet joints. No spinal canal stenosis.
No neural foraminal stenosis.

L2-L3: Medium-sized central disc protrusion causing mild spinal
canal stenosis with narrowing of both lateral recesses. No neural
foraminal stenosis.

L3-L4: Normal disc space and facet joints. No spinal canal stenosis.
No neural foraminal stenosis.

L4-L5: Mild disc bulge. No spinal canal stenosis. No neural
foraminal stenosis.

L5-S1: Normal disc space and facet joints. No spinal canal stenosis.
No neural foraminal stenosis.

Visualized sacrum: Normal.
IMPRESSION: 1. No discitis osteomyelitis or epidural abscess.
2. Postsurgical changes of the cervical spine with mild cord volume
loss at the postsurgical levels. Mild anterior deviation of the left
hemicord at the C6 level is likely postsurgical and could indicate
presence of a small arachnoid web.
3. Medium-sized central disc protrusion at L2-3 causing mild spinal
canal stenosis and narrowing of both lateral recesses.

## 2021-09-29 IMAGING — MR MR CERVICAL SPINE WO/W CM
5 of 9 series · 26 of 48 positions shown · IV contrast (9ml Gadavist)
Comparison: None Available.

CLINICAL DATA: Altered mental status.  History of epidural abscess.

EXAM:
MRI CERVICAL, THORACIC AND LUMBAR SPINE WITHOUT AND WITH CONTRAST
TECHNIQUE: Multiplanar and multiecho pulse sequences of the cervical spine, to
include the craniocervical junction and cervicothoracic junction,
and thoracic and lumbar spine, were obtained without and with
intravenous contrast.
CONTRAST:  9mL GADAVIST GADOBUTROL 1 MMOL/ML IV SOLN

[Series 23: T2 · sagittal · 3.0mm · 0.62mm/px · 3 of 15 slices shown (1 of 2)]
[im 1/15]
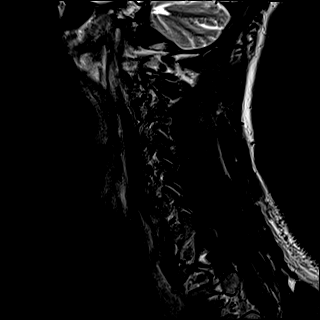
[im 8/15]
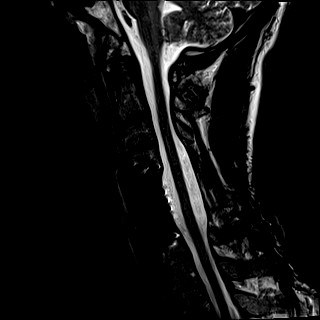
[im 15/15]
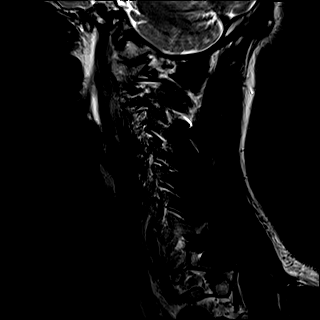

[Series 25: STIR · sagittal · 3.0mm · 0.62mm/px · 3 of 15 slices shown]
[im 1/15]
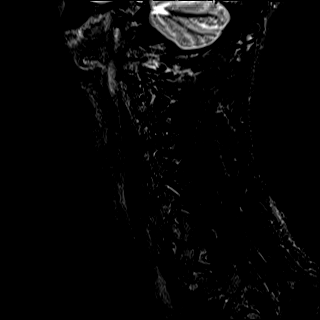
[im 8/15]
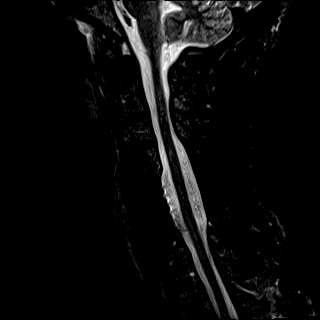
[im 15/15]
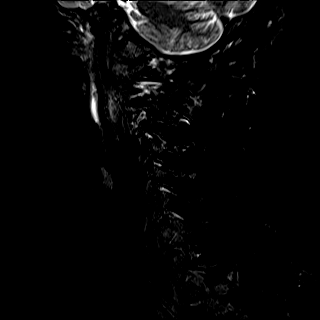

[Series 26: T2 · axial · 3.0mm · 0.70mm/px · z∈[-214,-109]mm · 7 of 31 slices shown (2 of 2)]
[im 1/31]
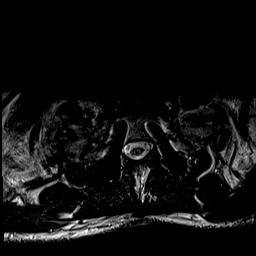
[im 6/31]
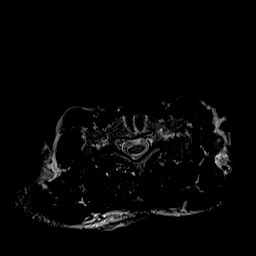
[im 11/31]
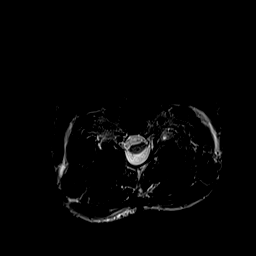
[im 16/31]
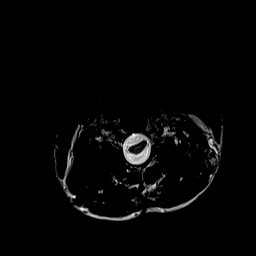
[im 21/31]
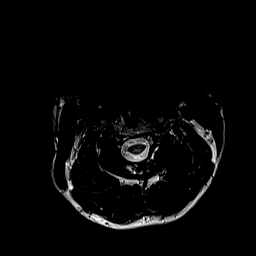
[im 26/31]
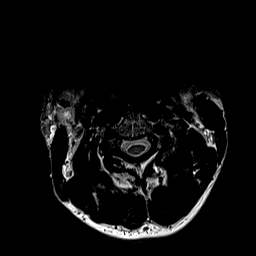
[im 31/31]
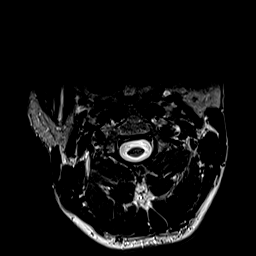

[Series 29: T1 · axial · non-contrast · 3.0mm · 0.35mm/px · z∈[-214,-109]mm · 8 of 32 slices shown]
[im 1/32]
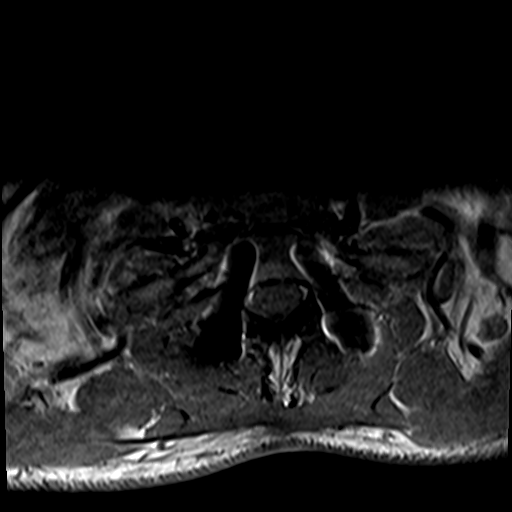
[im 5/32]
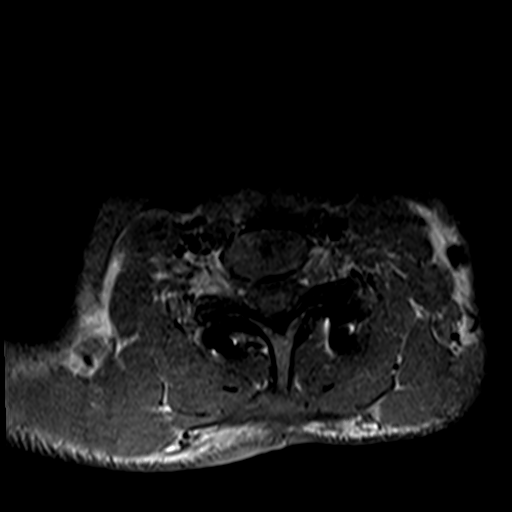
[im 9/32]
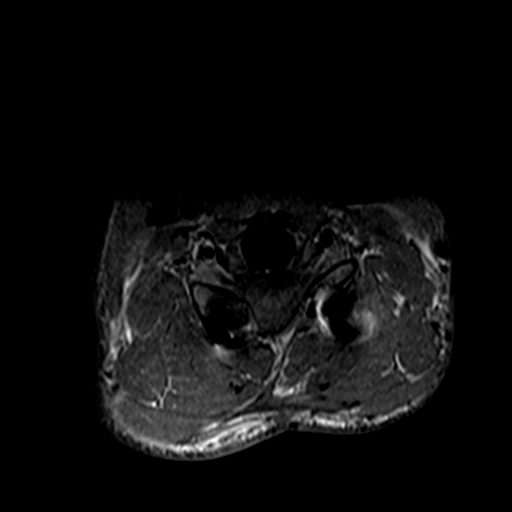
[im 14/32]
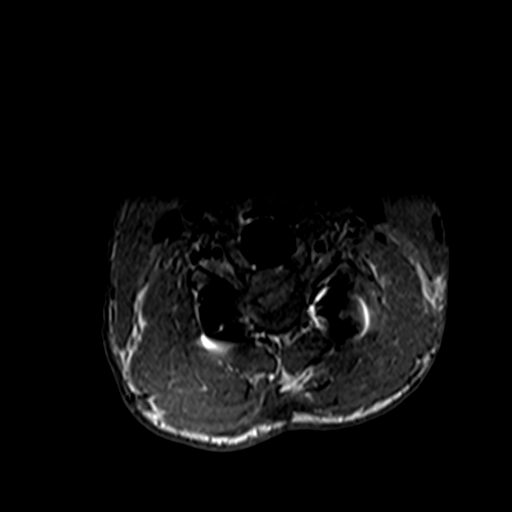
[im 18/32]
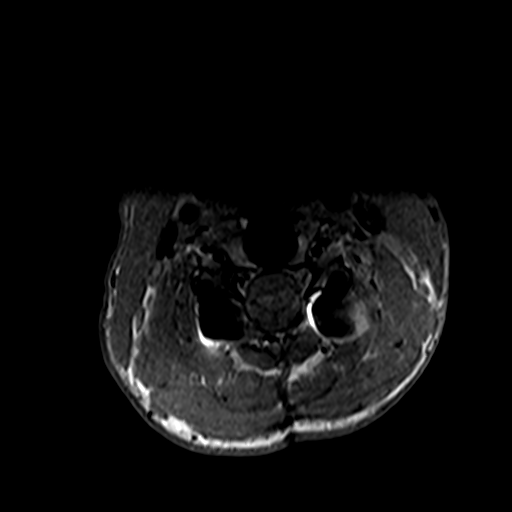
[im 23/32]
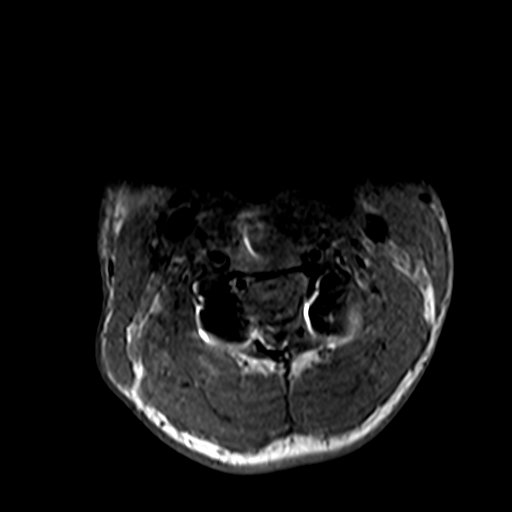
[im 27/32]
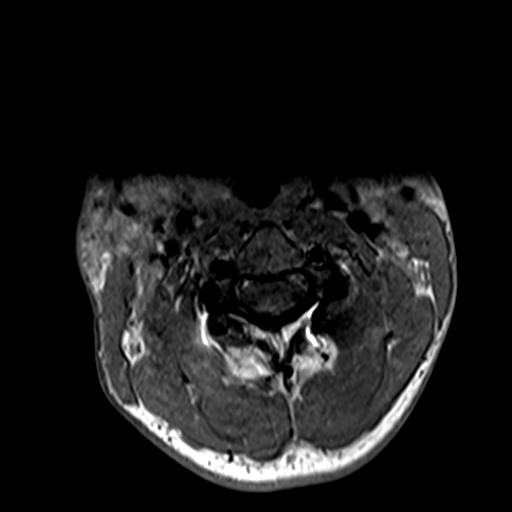
[im 32/32]
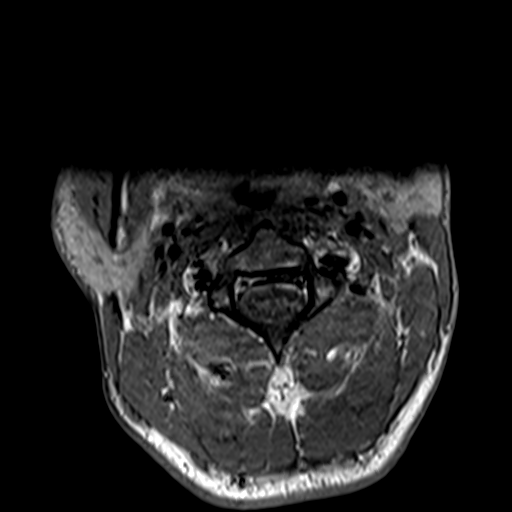

[Series 67: T1 post-contrast · axial · 3.0mm · 0.35mm/px · z∈[-214,-156]mm · 5 of 32 slices shown]
[im 1/32]
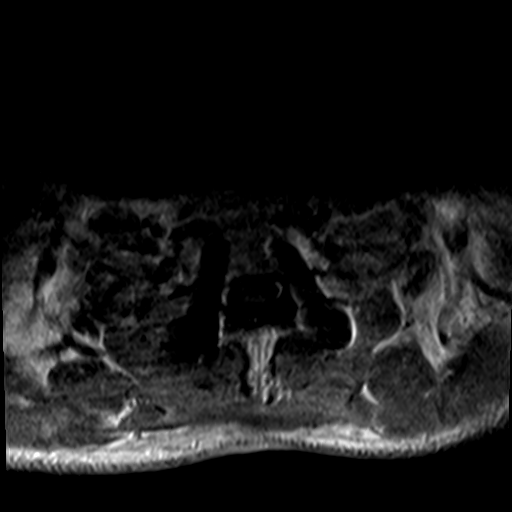
[im 5/32]
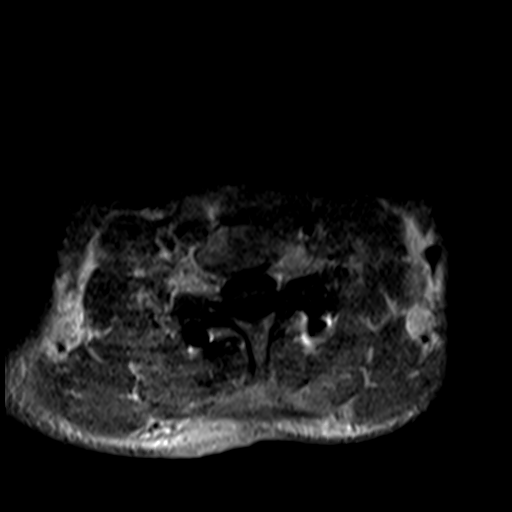
[im 9/32]
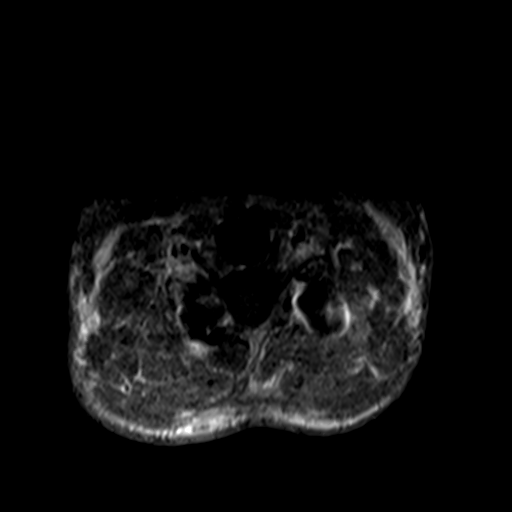
[im 14/32]
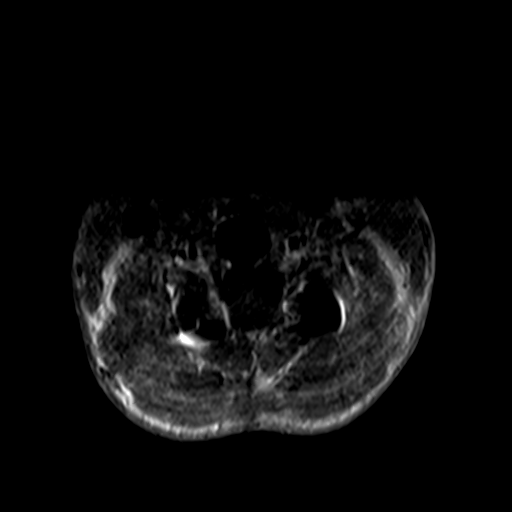
[im 18/32]
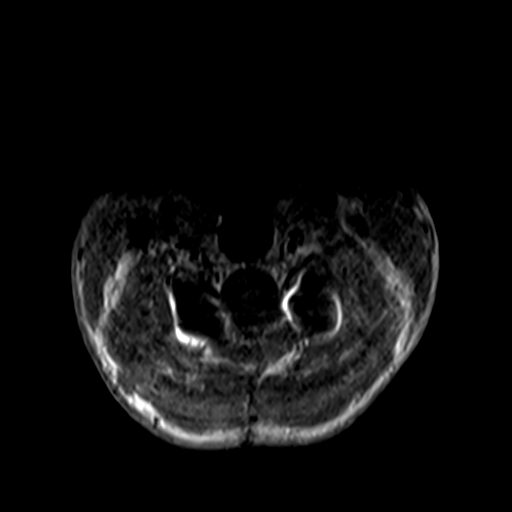

[26 of 48 positions shown; findings below may reference images not displayed]

FINDINGS: MRI CERVICAL SPINE FINDINGS

Alignment: Normal

Vertebrae: Status post C5-6 corpectomy with posterior decompression
at the C4-7 levels. There is anterior fusion at C4-7 and posterior
fusion from C2-T1.

Cord: There is mild cord volume loss at the postsurgical levels but
no abnormal signal. The left half of the spinal cord is deviated
anteriorly at the C6 level.

Posterior Fossa, vertebral arteries, paraspinal tissues:
Unremarkable

Disc levels:

The spinal canal is widely patent and there is no neural foraminal
stenosis. No epidural abscess or other fluid collection.

MRI THORACIC SPINE FINDINGS

Alignment:  Physiologic.

Vertebrae: No fracture, evidence of discitis, or bone lesion.

Cord:  Normal signal and morphology.  No epidural collection.

Paraspinal and other soft tissues: Negative

Disc levels:

No spinal canal stenosis.

MRI LUMBAR SPINE FINDINGS

Segmentation:  Standard.

Alignment:  Physiologic.

Vertebrae:  No fracture, evidence of discitis, or bone lesion.

Conus medullaris and cauda equina: Conus extends to the L1 level.
Conus and cauda equina appear normal. No abnormal contrast
enhancement. No epidural collection.

Paraspinal and other soft tissues: Normal

Disc levels:

L1-L2: Normal disc space and facet joints. No spinal canal stenosis.
No neural foraminal stenosis.

L2-L3: Medium-sized central disc protrusion causing mild spinal
canal stenosis with narrowing of both lateral recesses. No neural
foraminal stenosis.

L3-L4: Normal disc space and facet joints. No spinal canal stenosis.
No neural foraminal stenosis.

L4-L5: Mild disc bulge. No spinal canal stenosis. No neural
foraminal stenosis.

L5-S1: Normal disc space and facet joints. No spinal canal stenosis.
No neural foraminal stenosis.

Visualized sacrum: Normal.
IMPRESSION: 1. No discitis osteomyelitis or epidural abscess.
2. Postsurgical changes of the cervical spine with mild cord volume
loss at the postsurgical levels. Mild anterior deviation of the left
hemicord at the C6 level is likely postsurgical and could indicate
presence of a small arachnoid web.
3. Medium-sized central disc protrusion at L2-3 causing mild spinal
canal stenosis and narrowing of both lateral recesses.

## 2021-09-29 MED ORDER — LORAZEPAM 2 MG/ML IJ SOLN
2.0000 mg | Freq: Once | INTRAMUSCULAR | Status: AC
  Administered 2021-09-29: 2 mg via INTRAVENOUS
  Filled 2021-09-29: qty 1

## 2021-09-29 MED ORDER — GADOBUTROL 1 MMOL/ML IV SOLN
9.0000 mL | Freq: Once | INTRAVENOUS | Status: AC | PRN
  Administered 2021-09-29: 9 mL via INTRAVENOUS

## 2021-09-29 NOTE — ED Triage Notes (Signed)
Patient to ER via [facility] from home with complaints of neck pain. Patient reports having a cervical spine laminectomy in 2020, wound healed well. Reports recently he has been having significant pain to the area and now has scratched two wounds to the back of his neck. Reports he picks the scab off and purulent drainage comes out. Reports that he feels like his skin is "crystalising" and he gets a sharp pain where he has to pick at the skin.  Denies new injury.

## 2021-09-29 NOTE — ED Provider Notes (Signed)
[facility] Provider Note    Event Date/Time   First MD Initiated Contact with Patient 09/29/21 1903     (approximate)   History   Neck Injury   HPI  Michael Chandler is a 36 y.o. 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  Patient reports that this has been ongoing for approximately 8 months.  He also reports that he has developed paresthesias which she describes as "a bone is turned upside down in my feet" for the same duration of time.  He reports that after his surgery he was "paralyzed from the neck down" and had a tracheostomy.  He reports that he has been able to ambulate but his feet hurt when he ambulates.  He reports that he has had pain in his neck and in his back.  He denies any urinary or fecal incontinence or retention.  He denies weakness in his arms or legs but reports that he feels fatigued.  No fevers or chills.  He reports that he most recently used IV methamphetamine 2 days ago.  There are no problems to display for this patient.         Physical Exam   Triage Vital Signs: ED Triage Vitals  Enc Vitals Group     BP 09/29/21 1702 (!) 160/108     Pulse Rate 09/29/21 1702 72     Resp 09/29/21 1702 18     Temp 09/29/21 1702 99 F (37.2 C)     Temp Source 09/29/21 1702 Oral     SpO2 09/29/21 1702 98 %     Weight --      Height 09/29/21 1703 6\' 4"  (1.93 m)     Head Circumference --      Peak Flow --      Pain Score 09/29/21 1702 7     Pain Loc --      Pain Edu? --      Excl. in GC? --     Most recent vital signs: Vitals:   09/29/21 1702  BP: (!) 160/108  Pulse: 72  Resp: 18  Temp: 99 F (37.2 C)  SpO2: 98%    Physical Exam Vitals and nursing note reviewed.  Constitutional:      General: Awake and alert. No  acute distress.    Appearance: Normal appearance. He is well-developed and normal weight.  HENT:     Head: Normocephalic and atraumatic.     Mouth/Throat:     Mouth: Mucous membranes are moist.  Eyes:     General: PERRL. Normal EOMs        Right eye: No discharge.        Left eye: No discharge.     Conjunctiva/sclera: Conjunctivae normal.  Cardiovascular:     Rate and Rhythm: Normal rate and regular rhythm.     Pulses: Normal pulses.     Heart sounds: Normal heart sounds Pulmonary:     Effort: Pulmonary effort is normal. No respiratory distress.     Breath sounds: Normal breath sounds.  Abdominal:     Abdomen is soft. There is no abdominal tenderness. No rebound or guarding. No distention. Musculoskeletal:        General: No swelling. Normal range of motion.     Cervical back:  Normal range of motion and neck supple.  Patient has 2 open wounds that appear to be superficial over his midline surgical incision.  No active bleeding or discharge.  He has tenderness to his T-spine and his L-spine.  Back: No midline tenderness. Strength and sensation 5/5 to bilateral lower extremities. Normal great toe extension against resistance. Normal sensation throughout feet. Normal patellar reflexes. Negative SLR and opposite SLR bilaterally.  Skin:    General: Skin is warm and dry.     Capillary Refill: Capillary refill takes less than 2 seconds.     Findings: No rash.  Neurological:     Mental Status: He is alert.  Neurological: GCS 15 alert and oriented x3 Normal speech, no expressive or receptive aphasia or dysarthria Cranial nerves II through XII intact 5 out of 5 strength in all 4 extremities with intact sensation throughout No extremity drift Normal finger-to-nose testing, no limb or truncal ataxia  Psych: tangential speech, though normal eye contact, linear speech. Responds to redirection. Calm and cooperative. Denies SI/HI   ED Results / Procedures / Treatments   Labs (all labs  ordered are listed, but only abnormal results are displayed) Labs Reviewed  CBC WITH DIFFERENTIAL/PLATELET - Abnormal; Notable for the following components:      Result Value   RBC 4.19 (*)    Hemoglobin 11.2 (*)    HCT 35.8 (*)    All other components within normal limits  COMPREHENSIVE METABOLIC PANEL - Abnormal; Notable for the following components:   Potassium 3.0 (*)    Glucose, Bld 108 (*)    Calcium 8.7 (*)    All other components within normal limits  SEDIMENTATION RATE - Abnormal; Notable for the following components:   Sed Rate 52 (*)    All other components within normal limits  PROCALCITONIN  C-REACTIVE PROTEIN  HIV ANTIBODY (ROUTINE TESTING W REFLEX)  PROCALCITONIN  HELPER T-LYMPH-CD4 ([facility] ONLY)  RPR     EKG     RADIOLOGY Pending at the time of passoff    PROCEDURES:  Critical Care performed:   Procedures   MEDICATIONS ORDERED IN ED: Medications  gadobutrol (GADAVIST) 1 MMOL/ML injection 9 mL (has no administration in time range)  LORazepam (ATIVAN) injection 2 mg (2 mg Intravenous Given 09/29/21 2111)     IMPRESSION / MDM / ASSESSMENT AND PLAN / ED COURSE  I reviewed the triage vital signs and the nursing notes.   Differential diagnosis includes, but is not limited to, superficial skin infection, recurrence of epidural abscess with possibility of skip lesions, schizophrenia, bipolar disorder, medication noncompliance, cryptococcus or other HIV induced brain disorder, neurosyphilis.  Patient is awake and alert, hemodynamically stable and afebrile.  He has a GCS of 15, though he has tangential speech and a bizarre affect.  He does have a history of schizophrenia and bipolar disorder so possible component of psych.  However, given that he has HIV, other considerations include cryptococcus, or neurosyphilis.  Patient's mother came to the emergency department, and I was able to gather collateral information from her.  She reports that he appears to be  at his psychiatric baseline.  He had a recent psychiatric admission in [place], and returned home to [place] approximately 2 weeks ago.  She corroborates his story of his surgery in [place] and his HIV.  She reports that he has not been taking any of his medications.  She reports that he has not voiced any suicidal or homicidal ideation.  Labs obtained at triage  are overall reassuring.  However, given his immunocompromised state, history of HIV, and wound at the site of surgical intervention, as well as tenderness throughout his spine and subjective paresthesias, with his mildly elevated temperature upon arrival, recommended MRI of his entire spine.  I discussed this with Dr. Fuller Plan, who is in agreement with this plan.  She also recommends adding on an MRI of the brain to look for enhancement of the meninges.  The radiology tech requested medications as patient voiced difficulty laying still for the duration of imaging.  He was given IV Ativan.  Patient was passed off to Dr. Elesa Massed, pending MRI results and further bloodwork and final disposition.  Patient's presentation is most consistent with acute presentation with potential threat to life or bodily function.  Clinical Course as of 09/29/21 2319  Thu Sep 29, 2021  2045 Per radiology, meds requested given that he will have to lay down for 2 hours and patient reported difficulty with this. Ativan ordered [JP]  2319 Passed off to Dr. Elesa Massed [JP]    Clinical Course User Index [JP] Yasuko Lapage, Herb Grays, PA-C     FINAL CLINICAL IMPRESSION(S) / ED DIAGNOSES   Final diagnoses:  Neck pain  Bipolar affective disorder, remission status unspecified (HCC)  Schizophrenia, unspecified type (HCC)  HIV infection, unspecified symptom status (HCC)     Rx / DC Orders   ED Discharge Orders     None        Note:  This document was prepared using Dragon voice recognition software and may include unintentional dictation errors.   Keturah Shavers 09/29/21 2318    Concha Se, MD 10/01/21 [phone]

## 2021-09-29 NOTE — Discharge Instructions (Addendum)
Steps to find a Primary Care Provider (PCP):  Call [phone] or [phone] to access "[place] Find a Doctor Service."  2.  You may also go on the [facility] website at [email or web address]  Your MRI showed no acute surgical emergency including no sign of infection.  I recommend that you try to stop picking the wound on the back of your neck but do not need antibiotics today.  I recommend establishing care with a primary care doctor, infectious disease specialist as well as a psychiatrist.  Your MRI did show a disc herniation at the L2-3 lumbar area that could cause low back pain but would not be the cause of your neck pain today.

## 2021-09-29 NOTE — ED Provider Triage Note (Signed)
Emergency Medicine Provider Triage Evaluation Note  Firman Petrow, a 36 y.o. male  was evaluated in triage.  Pt complains of neck wounds.  Patient presents with what appears to be some recently scabbed wounds that he has been picking at.  Triage patient is seen for dating and picking at recent scabs.  His thoughts seem scattered and his affect is flat.  Review of Systems  Positive: Infected wounds Negative: FCS  Physical Exam  BP (!) 160/108   Pulse 72   Temp 99 F (37.2 C) (Oral)   Resp 18   Ht 6\' 4"  (1.93 m)   SpO2 98%   BMI 24.54 kg/m  Gen:   Awake, no distress  NAD Resp:  Normal effort CTA MSK:   Moves extremities without difficulty  Other:  2 distinct inflamed ,scabbed, picked skin wounds to the posterior neck  Medical Decision Making  Medically screening exam initiated at 5:14 PM.  Appropriate orders placed.  Jaquari Whiteley was informed that the remainder of the evaluation will be completed by another provider, this initial triage assessment does not replace that evaluation, and the importance of remaining in the ED until their evaluation is complete.  Patient to the ED for evaluation of wounds to the posterior neck.   Katrinka Blazing, PA-C 09/29/21 1717

## 2021-09-30 ENCOUNTER — Other Ambulatory Visit: Payer: Self-pay

## 2021-09-30 ENCOUNTER — Telehealth: Payer: Self-pay | Admitting: Emergency Medicine

## 2021-09-30 LAB — C-REACTIVE PROTEIN: CRP: 0.6 mg/dL (ref <1.0)

## 2021-09-30 LAB — RPR
RPR Ser Ql: REACTIVE — AB
RPR Titer: 1:4 {titer}

## 2021-09-30 LAB — HIV ANTIBODY (ROUTINE TESTING W REFLEX): HIV Screen 4th Generation wRfx: REACTIVE — AB

## 2021-09-30 MED ORDER — POTASSIUM CHLORIDE CRYS ER 20 MEQ PO TBCR
40.0000 meq | EXTENDED_RELEASE_TABLET | Freq: Once | ORAL | Status: AC
  Administered 2021-09-30: 40 meq via ORAL

## 2021-09-30 NOTE — ED Provider Notes (Signed)
1:20 AM  Assumed care at shift change.  Patient here with history of HIV, schizophrenia who presents to the emergency department with neck pain.  Has a wound to the posterior neck from picking at his skin but no signs of superimposed infection.  Was awaiting MRIs of the brain and spine with and without contrast to rule out infectious etiology.  I have reviewed/interpreted these images as has the radiologist and there is no sign of any acute neurosurgical emergency, infectious etiology.  He does have mild canal stenosis and disc herniation at L2-L3 but otherwise normal-appearing MRI.  Blood work today also reassuring.  No leukopenia or leukocytosis.  Sed rate slightly elevated but nonspecific.  Wound does not appear infected on exam.  I do not feel he needs antibiotics.  I feel he is safe for outpatient follow-up with primary care, psychiatry, infectious disease.   At this time, I do not feel there is any life-threatening condition present. I reviewed all nursing notes, vitals, pertinent previous records.  All lab and urine results, EKGs, imaging ordered have been independently reviewed and interpreted by myself.  I reviewed all available radiology reports from any imaging ordered this visit.  Based on my assessment, I feel the patient is safe to be discharged home without further emergent workup and can continue workup as an outpatient as needed. Discussed all findings, treatment plan as well as usual and customary return precautions with patient (no family at bedside).  They verbalize understanding and are comfortable with this plan.  Outpatient follow-up has been provided as needed.  All questions have been answered.      Patient gave verbal permission to utilize photo for medical documentation only. The image was not stored on any personal device.    Anjulie Dipierro, Layla Maw, DO 09/30/21 [phone]

## 2021-09-30 NOTE — ED Notes (Signed)
Pt provided sandwich box and juice.

## 2021-09-30 NOTE — Telephone Encounter (Signed)
Called patient  to inform of RPR results and the need to be treated/return.  Left message asking him  to call me back.

## 2021-09-30 NOTE — Telephone Encounter (Signed)
I called his mother to see if she could inform him to return or call me back.  She did not answer and her vm is full.

## 2021-09-30 NOTE — ED Notes (Signed)
Per EDP Ward. No need to repeat SST lab testing.

## 2021-09-30 NOTE — ED Notes (Signed)
Received a call from patient's mother informing that she would be here by 8:45 to pick him up

## 2021-10-02 ENCOUNTER — Telehealth: Payer: Self-pay

## 2021-10-02 NOTE — Telephone Encounter (Signed)
I spoke to the patient to scheduled for an appointment with [facility].  Patient stated he does not wish to be seen by our office or be treated for his HIV at this time.  I did discuss with the patient how important it is for him to begin treatment for his HIV.  Patient stated he feels he if fine and does not need treatment.  Patient stated he would like to see and internal medicine doctor only that could possibly also treat his HIV.   I advised the patient that I can refer him to Dr. Ninetta Lights with IM and he also has experience in managing HIV.  Patient has been advised that the location will be in [place] and verbalized understanding and is ok with being seen in [place].  Patient also stated insurance is medicaid and medicare.  I will place referral to IM for the patient.  Ramon Zanders [name] Loveless

## 2021-10-03 ENCOUNTER — Emergency Department: Payer: Self-pay

## 2021-10-03 ENCOUNTER — Emergency Department
Admission: EM | Admit: 2021-10-03 | Discharge: 2021-10-03 | Discharge status: Against Medical Advice | Payer: Self-pay | Attending: Emergency Medicine | Admitting: Emergency Medicine

## 2021-10-03 ENCOUNTER — Other Ambulatory Visit: Payer: Self-pay

## 2021-10-03 ENCOUNTER — Encounter: Payer: Self-pay | Admitting: Emergency Medicine

## 2021-10-03 DIAGNOSIS — R059 Cough, unspecified: Secondary | ICD-10-CM | POA: Insufficient documentation

## 2021-10-03 DIAGNOSIS — R0602 Shortness of breath: Secondary | ICD-10-CM | POA: Insufficient documentation

## 2021-10-03 DIAGNOSIS — J45909 Unspecified asthma, uncomplicated: Secondary | ICD-10-CM | POA: Insufficient documentation

## 2021-10-03 DIAGNOSIS — R079 Chest pain, unspecified: Secondary | ICD-10-CM | POA: Insufficient documentation

## 2021-10-03 DIAGNOSIS — Z5321 Procedure and treatment not carried out due to patient leaving prior to being seen by health care provider: Secondary | ICD-10-CM | POA: Insufficient documentation

## 2021-10-03 LAB — T.PALLIDUM AB, TOTAL: T Pallidum Abs: REACTIVE — AB

## 2021-10-03 LAB — BASIC METABOLIC PANEL
Anion gap: 6 (ref 5–15)
BUN: 13 mg/dL (ref 6–20)
CO2: 22 mmol/L (ref 22–32)
Calcium: 8.6 mg/dL — ABNORMAL LOW (ref 8.9–10.3)
Chloride: 108 mmol/L (ref 98–111)
Creatinine, Ser: 0.8 mg/dL (ref 0.61–1.24)
GFR, Estimated: >60 mL/min (ref >60)
Glucose, Bld: 101 mg/dL — ABNORMAL HIGH (ref 70–99)
Potassium: 3.4 mmol/L — ABNORMAL LOW (ref 3.5–5.1)
Sodium: 136 mmol/L (ref 135–145)

## 2021-10-03 LAB — CBC
HCT: 37.6 % — ABNORMAL LOW (ref 39.0–52.0)
Hemoglobin: 11.7 g/dL — ABNORMAL LOW (ref 13.0–17.0)
MCH: 26.4 pg (ref 26.0–34.0)
MCHC: 31.1 g/dL (ref 30.0–36.0)
MCV: 84.7 fL (ref 80.0–100.0)
Platelets: 288 10*3/uL (ref 150–400)
RBC: 4.44 MIL/uL (ref 4.22–5.81)
RDW: 14.5 % (ref 11.5–15.5)
WBC: 4.4 10*3/uL (ref 4.0–10.5)
nRBC: 0 % (ref 0.0–0.2)

## 2021-10-03 LAB — TROPONIN I (HIGH SENSITIVITY): Troponin I (High Sensitivity): 6 ng/L (ref <18)

## 2021-10-03 IMAGING — CR DG CHEST 2V
1 series · 2 of 2 positions shown · non-contrast
Comparison: None

CLINICAL DATA: 36-year-old male presents for evaluation of
shortness of breath.

EXAM:
CHEST - 2 VIEW

[Series 1: dg chest 2 view · 0.14mm/px · 2 of 2 slices shown]
[im 1/2]
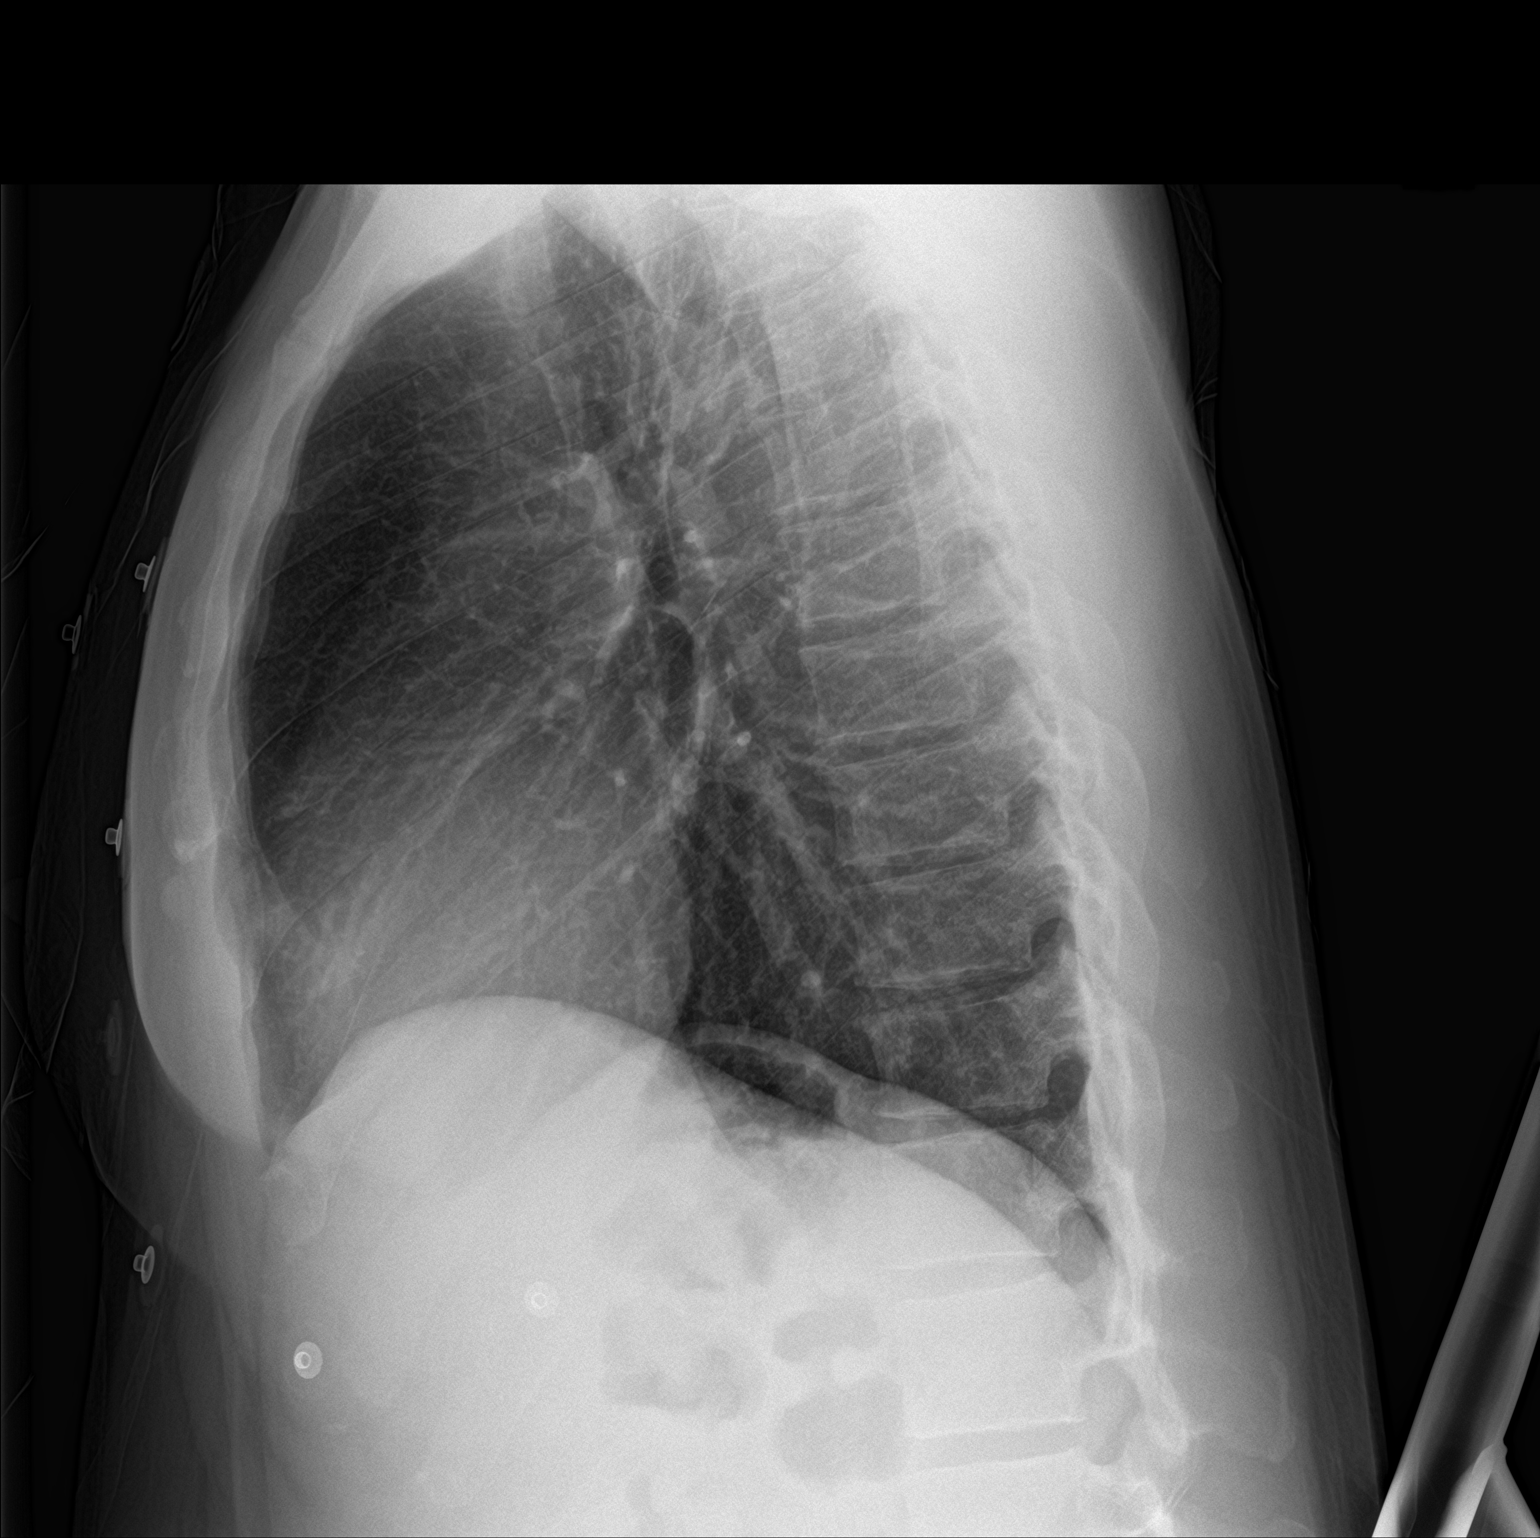
[im 2/2]
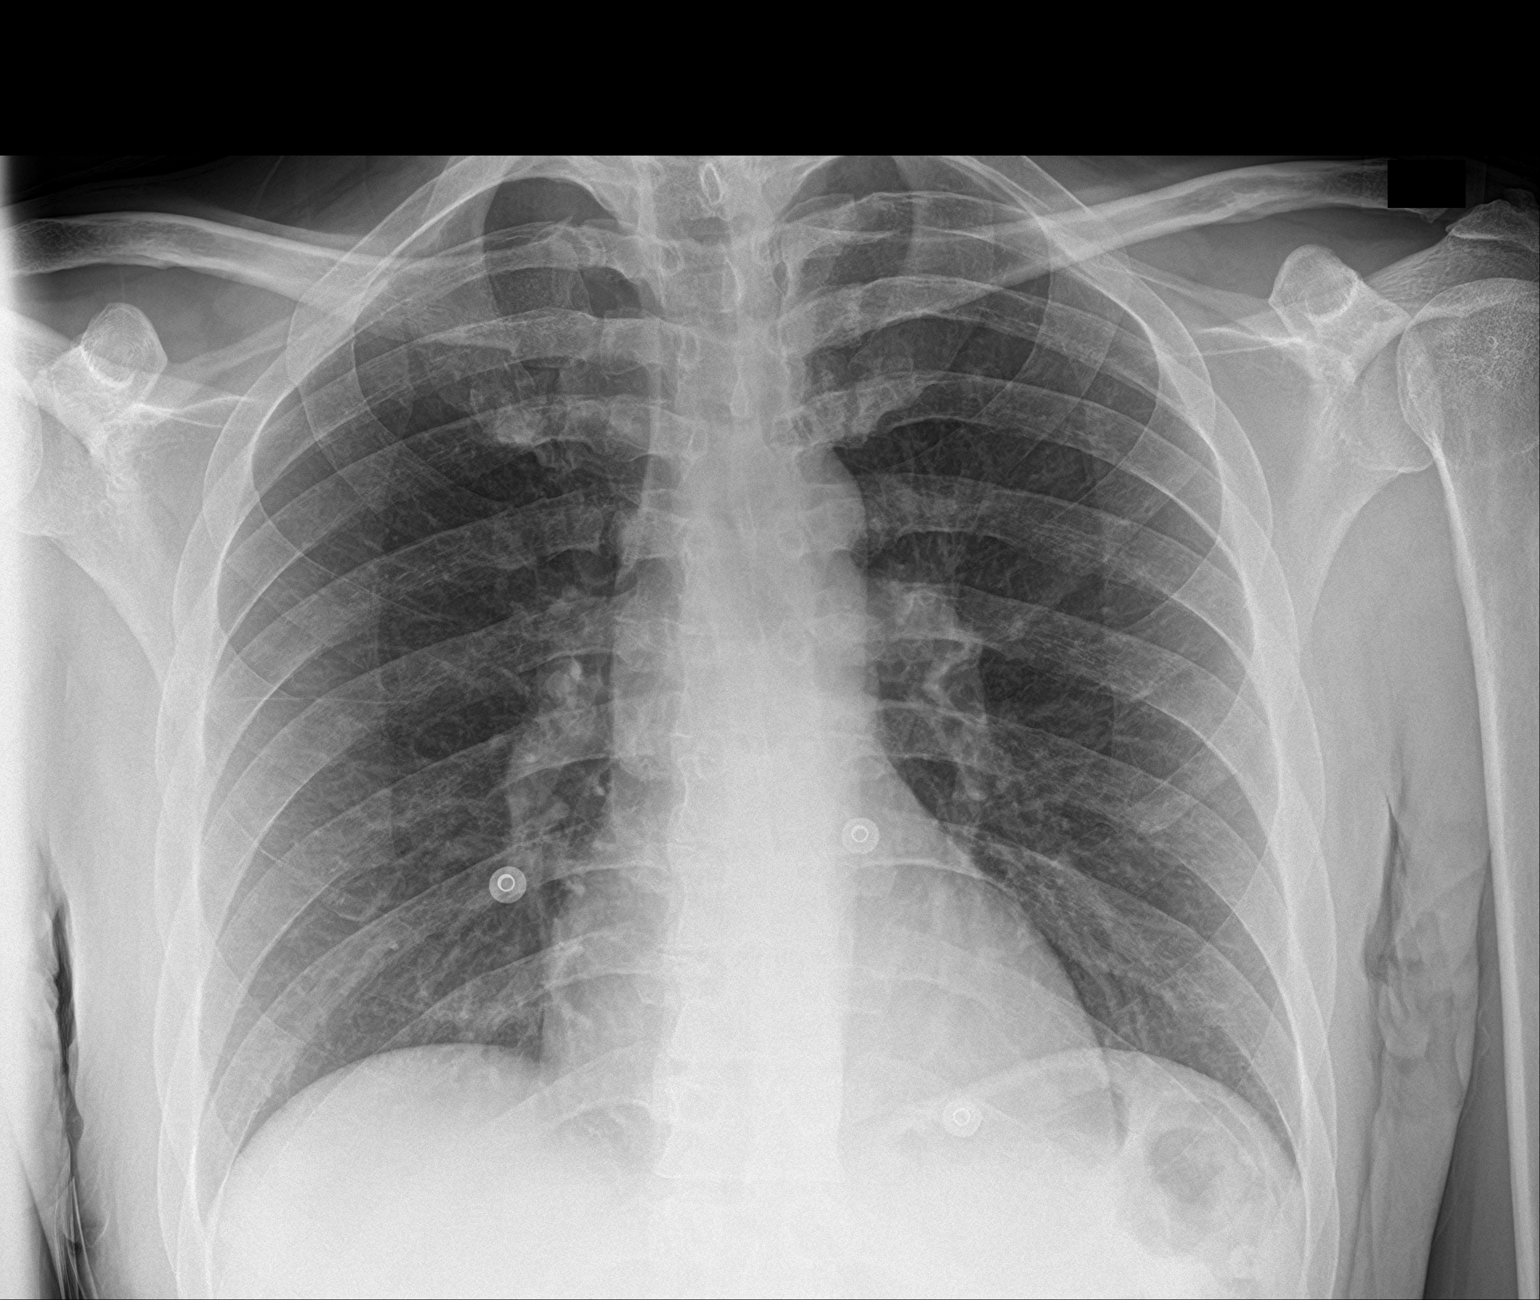

[2 of 2 positions shown; findings below may reference images not displayed]

FINDINGS: The heart size and mediastinal contours are within normal limits.
Both lungs are clear. The visualized skeletal structures are
unremarkable.
IMPRESSION: No active cardiopulmonary disease.

## 2021-10-03 NOTE — ED Triage Notes (Signed)
Pt via EMS from home. Pt c/o SOB, mid-chest pain, cough for the past weeks. Pt has a hx of asthma and has been out of his inhaler for "a while" Denies fever. Pt also wanting SW consult for "place to stay" states he stays with his mom but wants his "own place" Pt is A&OX4 and NAD

## 2021-10-03 NOTE — ED Notes (Signed)
Pt here via [facility] from home with SOB since last night. Pt is out of his albuterol inhaler. Pt also has a cough. Pt denies CP, back pain, or fever. Pt has hx of asthma.  98.5 97% RA 132/83

## 2021-10-04 ENCOUNTER — Telehealth: Payer: Self-pay | Admitting: *Deleted

## 2021-10-04 NOTE — Telephone Encounter (Signed)
Called patient again and left message asking him to call me.

## 2021-10-04 NOTE — Telephone Encounter (Signed)
Pt calling for CXR results from ED eval yesterday. Pt LWBS. Results provided, pt verbalizes understanding. NP appt secured for pt by agent prior to transfer.

## 2021-10-14 NOTE — Telephone Encounter (Signed)
Called patient again and I was able to speak with  him.  He says he was diagnosed and treated for syphilis about 10 years ago in [place].  I have asked him to either follow up with [facility] or with ID to assure he does not need any further treatment.  He agrees.

## 2021-10-21 ENCOUNTER — Ambulatory Visit: Payer: Self-pay | Admitting: Nurse Practitioner

## 2021-11-07 ENCOUNTER — Emergency Department (HOSPITAL_COMMUNITY)
Admission: EM | Admit: 2021-11-07 | Discharge: 2021-11-07 | Discharge status: Against Medical Advice | Payer: Medicare Other

## 2021-11-07 NOTE — ED Notes (Signed)
Called for triage, no answer

## 2021-11-08 ENCOUNTER — Emergency Department: Payer: Medicare Other

## 2021-11-08 ENCOUNTER — Other Ambulatory Visit: Payer: Self-pay

## 2021-11-08 ENCOUNTER — Emergency Department
Admission: EM | Admit: 2021-11-08 | Discharge: 2021-11-08 | Disposition: A | Discharge status: Home / Self Care | Payer: Medicare Other | Attending: Emergency Medicine | Admitting: Emergency Medicine

## 2021-11-08 DIAGNOSIS — B370 Candidal stomatitis: Secondary | ICD-10-CM | POA: Insufficient documentation

## 2021-11-08 DIAGNOSIS — R1084 Generalized abdominal pain: Secondary | ICD-10-CM | POA: Insufficient documentation

## 2021-11-08 DIAGNOSIS — J069 Acute upper respiratory infection, unspecified: Secondary | ICD-10-CM | POA: Insufficient documentation

## 2021-11-08 DIAGNOSIS — R000 Tachycardia, unspecified: Secondary | ICD-10-CM | POA: Diagnosis not present

## 2021-11-08 DIAGNOSIS — Z20822 Contact with and (suspected) exposure to covid-19: Secondary | ICD-10-CM | POA: Diagnosis not present

## 2021-11-08 LAB — COMPREHENSIVE METABOLIC PANEL
ALT: 23 U/L (ref 0–44)
AST: 30 U/L (ref 15–41)
Albumin: 3.5 g/dL (ref 3.5–5.0)
Alkaline Phosphatase: 121 U/L (ref 38–126)
Anion gap: 5 (ref 5–15)
BUN: 9 mg/dL (ref 6–20)
CO2: 26 mmol/L (ref 22–32)
Calcium: 8.6 mg/dL — ABNORMAL LOW (ref 8.9–10.3)
Chloride: 105 mmol/L (ref 98–111)
Creatinine, Ser: 0.83 mg/dL (ref 0.61–1.24)
GFR, Estimated: >60 mL/min (ref >60)
Glucose, Bld: 72 mg/dL (ref 70–99)
Potassium: 3.6 mmol/L (ref 3.5–5.1)
Sodium: 136 mmol/L (ref 135–145)
Total Bilirubin: 0.4 mg/dL (ref 0.3–1.2)
Total Protein: 7.7 g/dL (ref 6.5–8.1)

## 2021-11-08 LAB — CBC
HCT: 37.4 % — ABNORMAL LOW (ref 39.0–52.0)
Hemoglobin: 11.6 g/dL — ABNORMAL LOW (ref 13.0–17.0)
MCH: 26.6 pg (ref 26.0–34.0)
MCHC: 31 g/dL (ref 30.0–36.0)
MCV: 85.8 fL (ref 80.0–100.0)
Platelets: 297 10*3/uL (ref 150–400)
RBC: 4.36 MIL/uL (ref 4.22–5.81)
RDW: 13.5 % (ref 11.5–15.5)
WBC: 4.7 10*3/uL (ref 4.0–10.5)
nRBC: 0 % (ref 0.0–0.2)

## 2021-11-08 LAB — GROUP A STREP BY PCR: Group A Strep by PCR: NOT DETECTED

## 2021-11-08 LAB — RESP PANEL BY RT-PCR (FLU A&B, COVID) ARPGX2
Influenza A by PCR: NEGATIVE
Influenza B by PCR: NEGATIVE
SARS Coronavirus 2 by RT PCR: NEGATIVE

## 2021-11-08 LAB — LIPASE, BLOOD: Lipase: 27 U/L (ref 11–51)

## 2021-11-08 MED ORDER — IOHEXOL 300 MG/ML  SOLN
100.0000 mL | Freq: Once | INTRAMUSCULAR | Status: AC | PRN
Start: 2021-11-08 — End: 2021-11-08
  Administered 2021-11-08: 100 mL via INTRAVENOUS

## 2021-11-08 MED ORDER — ONDANSETRON 8 MG PO TBDP: 8.0000 mg | ORAL_TABLET | Freq: Three times a day (TID) | ORAL | 0 refills | Status: DC | PRN

## 2021-11-08 MED ORDER — ONDANSETRON HCL 4 MG/2ML IJ SOLN
4.0000 mg | Freq: Once | INTRAMUSCULAR | Status: AC
  Administered 2021-11-08: 4 mg via INTRAVENOUS
  Filled 2021-11-08: qty 2

## 2021-11-08 MED ORDER — FAMOTIDINE 20 MG PO TABS: 20.0000 mg | ORAL_TABLET | Freq: Two times a day (BID) | ORAL | 0 refills | Status: DC

## 2021-11-08 MED ORDER — POLYETHYLENE GLYCOL 3350 17 G PO PACK: 17.0000 g | PACK | Freq: Every day | ORAL | 0 refills | Status: DC

## 2021-11-08 MED ORDER — KETOROLAC TROMETHAMINE 30 MG/ML IJ SOLN
30.0000 mg | Freq: Once | INTRAMUSCULAR | Status: AC
  Administered 2021-11-08: 30 mg via INTRAVENOUS
  Filled 2021-11-08: qty 1

## 2021-11-08 MED ORDER — SODIUM CHLORIDE 0.9 % IV BOLUS
1000.0000 mL | Freq: Once | INTRAVENOUS | Status: AC
  Administered 2021-11-08: 1000 mL via INTRAVENOUS

## 2021-11-08 MED ORDER — FLUCONAZOLE 100 MG PO TABS
200.0000 mg | ORAL_TABLET | Freq: Once | ORAL | Status: AC
Start: 2021-11-08 — End: 2021-11-08
  Administered 2021-11-08: 200 mg via ORAL
  Filled 2021-11-08: qty 2

## 2021-11-08 MED ORDER — FLUCONAZOLE 100 MG PO TABS: 100.0000 mg | ORAL_TABLET | Freq: Every day | ORAL | 0 refills | Status: AC

## 2021-11-08 NOTE — ED Triage Notes (Signed)
Pt comes with c/o sore throat, vomiting and abdominal pain. Pt states this all started month ago. Pt states he needs a complete check up.

## 2021-11-08 NOTE — ED Notes (Signed)
Pt provided a urinal and reminding about urine specimen.

## 2021-11-08 NOTE — ED Provider Notes (Signed)
[facility] Provider Note    Event Date/Time   First MD Initiated Contact with Patient 11/08/21 1502     (approximate)   History   Abdominal Pain and Sore Throat   HPI  Michael Chandler is a 36 y.o. male with a documented history of HIV (which the patient denies) not on medication, cervical abscesses requiring decompression laminectomy in 2021 in [place] who presents with multiple complaints.  The patient primarily reports sore throat which has been present for approximately 2 months, persistent course, associated with debris in the back of his throat as well as pain on swallowing.  He denies shortness of breath.  In addition, he reports diffuse abdominal pain which is constant but intermittently improves.  This has also been present for few months.  He reports associated nausea, vomiting, and dry heaving, decreased appetite, but no diarrhea.  He has no urinary symptoms.  He also reports nasal congestion and rhinorrhea that has been present for approximately 2 weeks.     Physical Exam   Triage Vital Signs: ED Triage Vitals  Enc Vitals Group     BP 11/08/21 1405 129/83     Pulse Rate 11/08/21 1405 (!) 117     Resp 11/08/21 1405 19     Temp 11/08/21 1405 98.5 F (36.9 C)     Temp src --      SpO2 11/08/21 1405 100 %     Weight 11/08/21 1507 205 lb 0.4 oz (93 kg)     Height 11/08/21 1507 6\' 5"  (1.956 m)     Head Circumference --      Peak Flow --      Pain Score 11/08/21 1356 0     Pain Loc --      Pain Edu? --      Excl. in GC? --     Most recent vital signs: Vitals:   11/08/21 1715 11/08/21 1730  BP: 114/69 108/68  Pulse: 100 100  Resp: 18 18  Temp:  99 F (37.2 C)  SpO2: 100% 100%     General: Alert and oriented, comfortable appearing. CV:  Good peripheral perfusion.  Resp:  Normal effort.  Abd:  Soft with mild diffuse tenderness.  No peritoneal signs.  No distention.  Other:  Moderate thrush present throughout oropharynx.  Tonsils appear  normal.  Oropharynx otherwise clear.  No stridor.  Clear voice.  No pooled secretions.  No cervical lymphadenopathy.   ED Results / Procedures / Treatments   Labs (all labs ordered are listed, but only abnormal results are displayed) Labs Reviewed  COMPREHENSIVE METABOLIC PANEL - Abnormal; Notable for the following components:      Result Value   Calcium 8.6 (*)    All other components within normal limits  CBC - Abnormal; Notable for the following components:   Hemoglobin 11.6 (*)    HCT 37.4 (*)    All other components within normal limits  GROUP A STREP BY PCR  RESP PANEL BY RT-PCR (FLU A&B, COVID) ARPGX2  LIPASE, BLOOD  URINALYSIS, ROUTINE W REFLEX MICROSCOPIC     EKG     RADIOLOGY  CT abdomen/pelvis: I independently viewed and interpreted the images; there is no free fluid, dilated bowel loops, or other acute findings.  Radiology report indicates the following:  IMPRESSION:  1. No acute findings identified within the abdomen or pelvis.  2. Diffuse periportal edema which is nonspecific but may be seen in  the setting of hepatitis.  3. Moderate retained stool within the rectum.      PROCEDURES:  Critical Care performed: No  Procedures   MEDICATIONS ORDERED IN ED: Medications  sodium chloride 0.9 % bolus 1,000 mL (1,000 mLs Intravenous New Bag/Given 11/08/21 1611)  ketorolac (TORADOL) 30 MG/ML injection 30 mg (30 mg Intravenous Given 11/08/21 1611)  ondansetron (ZOFRAN) injection 4 mg (4 mg Intravenous Given 11/08/21 1611)  iohexol (OMNIPAQUE) 300 MG/ML solution 100 mL (100 mLs Intravenous Contrast Given 11/08/21 1624)  fluconazole (DIFLUCAN) tablet 200 mg (200 mg Oral Given 11/08/21 1746)     IMPRESSION / MDM / ASSESSMENT AND PLAN / ED COURSE  I reviewed the triage vital signs and the nursing notes.  36 year old male with PMH as noted above presents with multiple complaints, primarily subacute to chronic sore throat, abdominal pain, vomiting, and subacute  URI symptoms.  I reviewed the past medical records.  The patient has several prior ED visits here, most recently in June for worsening neck pain with no acute findings on imaging.  He has no recent inpatient admissions or outpatient visits here, and I am unable to see his prior records from [place] in Care Everywhere.  On exam the patient is well-appearing.  He is tachycardic with otherwise normal vital signs.  Exam otherwise as described above, significant for thrush.  For the sore throat and URI symptoms differential diagnosis includes, but is not limited to, thrush, strep throat, viral pharyngitis, viral URI, COVID-19.  The patient has no evidence of airway obstruction and there is no indication for imaging.  For the abdominal pain and vomiting, differential includes gastroenteritis, gastritis, pancreatitis, other metabolic cause, colitis, diverticulitis.  I do not suspect bowel obstruction or other surgical emergency given the relatively reassuring exam.  Although the patient has a history of schizophrenia, he is alert and oriented, answers all of my questions appropriately, and does not appear to have grossly disorganized thought or to be responding to internal stimuli.  He does ask "to be "monitored" and wants his potassium checked.  He states this is what is happened when he has been in the hospital previously.  I reassured him that his potassium and other electrolytes are normal but that we will be doing additional work-up, although I do not suspect he will need to be admitted.  Given his psychiatric history and somewhat vague symptoms as well as the tachycardia we will give fluids, obtain CT abdomen/pelvis, strep and viral swabs, and reassess  Patient's presentation is most consistent with acute complicated illness / injury requiring diagnostic workup.  ----------------------------------------- 6:15 PM on 11/08/2021 -----------------------------------------  Patient has been sleeping  comfortably since my initial evaluation and appears comfortable on reassessment.  Lab work-up is reassuring.  He has no leukocytosis.  Hemoglobin is at baseline.  Electrolytes and LFTs are normal.  Lipase is normal.  Respiratory panel and strep swab are negative.  CT shows some periportal edema which is nonspecific but the lab work-up and clinical presentation is not suggestive of hepatitis.  There is also presence of stool; constipation can be contributing the patient's symptoms.  At this time, the patient is stable for discharge.  I counseled him on the results of the work-up.  I have prescribed fluconazole for moderate thrush as well as Zofran, Pepcid, and MiraLAX for symptomatic treatment of his abdominal symptoms.  I gave the patient thorough return precautions and he expressed understanding.   FINAL CLINICAL IMPRESSION(S) / ED DIAGNOSES   Final diagnoses:  Thrush  Generalized abdominal pain  Viral upper respiratory infection     Rx / DC Orders   ED Discharge Orders          Ordered    fluconazole (DIFLUCAN) 100 MG tablet  Daily        11/08/21 1811    ondansetron (ZOFRAN-ODT) 8 MG disintegrating tablet  Every 8 hours PRN        11/08/21 1811    famotidine (PEPCID) 20 MG tablet  2 times daily        11/08/21 1811    polyethylene glycol (MIRALAX) 17 g packet  Daily        11/08/21 1814             Note:  This document was prepared using Dragon voice recognition software and may include unintentional dictation errors.    Dionne Bucy, MD 11/08/21 1818

## 2021-11-08 NOTE — Discharge Instructions (Addendum)
You have thrush which is a fungal infection in your throat.  Take the fluconazole daily for 2 weeks as prescribed.  You may take the prescribed Zofran (ondansetron) as needed for nausea and should take the Pepcid twice daily for the next couple of weeks to help with your stomach pain.  Your scan is showing a lot of stool in your large intestine.  You can take the MiraLAX to help clear this and relieve constipation which can also be causing your pain.  Return to the ER for new, worsening, or persistent severe throat, neck, or stomach pain, vomiting, fever, weakness, or any other new or worsening symptoms that concern you.

## 2021-11-08 NOTE — ED Provider Triage Note (Signed)
Emergency Medicine Provider Triage Evaluation Note  Moe Graca, a 36 y.o. male  was evaluated in triage.  Pt complains of abdominal pain, sore throat, nasal congestion, and some intermittent vomiting.  Patient reports symptoms began a month ago.  According to the patient, he needs a "complete checkup."  Review of Systems  Positive: Abd pain, nasal congestion Negative: FCS  Physical Exam  BP 129/83   Pulse (!) 117   Temp 98.5 F (36.9 C)   Resp 19   SpO2 100%  Gen:   Awake, no distress  Flight of ideas.  Resp:  Normal effort CTA MSK:   Moves extremities without difficulty  Other:   Medical Decision Making   Medically screening exam initiated at 2:09 PM.  Appropriate orders placed.  Jaaron Newmark was informed that the remainder of the evaluation will be completed by another provider, this initial triage assessment does not replace that evaluation, and the importance of remaining in the ED until their evaluation is complete.  Patient to the ED for evaluation of abdominal pain, sore throat and sinus congestion for the last month.   Lissa Hoard, PA-C 11/08/21 1410

## 2021-11-14 DIAGNOSIS — R441 Visual hallucinations: Secondary | ICD-10-CM | POA: Insufficient documentation

## 2021-11-14 DIAGNOSIS — R440 Auditory hallucinations: Secondary | ICD-10-CM | POA: Diagnosis present

## 2021-11-14 DIAGNOSIS — G4700 Insomnia, unspecified: Secondary | ICD-10-CM | POA: Diagnosis not present

## 2021-11-14 DIAGNOSIS — Z20822 Contact with and (suspected) exposure to covid-19: Secondary | ICD-10-CM | POA: Insufficient documentation

## 2021-11-14 DIAGNOSIS — L03011 Cellulitis of right finger: Secondary | ICD-10-CM | POA: Diagnosis not present

## 2021-11-14 NOTE — ED Notes (Signed)
First nurse note-[place] with his mother.  Pt has abd pain, back pain and is hearing voices.  Out of meds for 1 month per ems.   Bp 160/100, oxygen sats 98%, p-88 per ems.

## 2021-11-15 ENCOUNTER — Other Ambulatory Visit: Payer: Self-pay

## 2021-11-15 ENCOUNTER — Emergency Department: Payer: Medicare Other

## 2021-11-15 ENCOUNTER — Emergency Department
Admission: EM | Admit: 2021-11-15 | Discharge: 2021-11-15 | Disposition: A | Discharge status: Home / Self Care | Payer: Medicare Other | Attending: Emergency Medicine | Admitting: Emergency Medicine

## 2021-11-15 DIAGNOSIS — R440 Auditory hallucinations: Secondary | ICD-10-CM | POA: Diagnosis not present

## 2021-11-15 DIAGNOSIS — R441 Visual hallucinations: Secondary | ICD-10-CM

## 2021-11-15 DIAGNOSIS — G4700 Insomnia, unspecified: Secondary | ICD-10-CM

## 2021-11-15 DIAGNOSIS — L03011 Cellulitis of right finger: Secondary | ICD-10-CM

## 2021-11-15 LAB — URINE DRUG SCREEN, QUALITATIVE (ARMC ONLY)
Amphetamines, Ur Screen: POSITIVE — AB
Barbiturates, Ur Screen: NOT DETECTED
Benzodiazepine, Ur Scrn: NOT DETECTED
Cannabinoid 50 Ng, Ur ~~LOC~~: NOT DETECTED
Cocaine Metabolite,Ur ~~LOC~~: NOT DETECTED
MDMA (Ecstasy)Ur Screen: NOT DETECTED
Methadone Scn, Ur: NOT DETECTED
Opiate, Ur Screen: NOT DETECTED
Phencyclidine (PCP) Ur S: NOT DETECTED
Tricyclic, Ur Screen: NOT DETECTED

## 2021-11-15 LAB — ETHANOL: Alcohol, Ethyl (B): <10 mg/dL (ref <10)

## 2021-11-15 LAB — COMPREHENSIVE METABOLIC PANEL
ALT: 24 U/L (ref 0–44)
AST: 35 U/L (ref 15–41)
Albumin: 3.4 g/dL — ABNORMAL LOW (ref 3.5–5.0)
Alkaline Phosphatase: 126 U/L (ref 38–126)
Anion gap: 5 (ref 5–15)
BUN: 21 mg/dL — ABNORMAL HIGH (ref 6–20)
CO2: 25 mmol/L (ref 22–32)
Calcium: 8.3 mg/dL — ABNORMAL LOW (ref 8.9–10.3)
Chloride: 107 mmol/L (ref 98–111)
Creatinine, Ser: 1 mg/dL (ref 0.61–1.24)
GFR, Estimated: >60 mL/min (ref >60)
Glucose, Bld: 82 mg/dL (ref 70–99)
Potassium: 3.9 mmol/L (ref 3.5–5.1)
Sodium: 137 mmol/L (ref 135–145)
Total Bilirubin: 0.7 mg/dL (ref 0.3–1.2)
Total Protein: 8 g/dL (ref 6.5–8.1)

## 2021-11-15 LAB — CBC
HCT: 33.4 % — ABNORMAL LOW (ref 39.0–52.0)
Hemoglobin: 10.7 g/dL — ABNORMAL LOW (ref 13.0–17.0)
MCH: 26.8 pg (ref 26.0–34.0)
MCHC: 32 g/dL (ref 30.0–36.0)
MCV: 83.5 fL (ref 80.0–100.0)
Platelets: 257 10*3/uL (ref 150–400)
RBC: 4 MIL/uL — ABNORMAL LOW (ref 4.22–5.81)
RDW: 13.3 % (ref 11.5–15.5)
WBC: 3.5 10*3/uL — ABNORMAL LOW (ref 4.0–10.5)
nRBC: 0 % (ref 0.0–0.2)

## 2021-11-15 LAB — ACETAMINOPHEN LEVEL: Acetaminophen (Tylenol), Serum: <10 ug/mL — ABNORMAL LOW (ref 10–30)

## 2021-11-15 LAB — SALICYLATE LEVEL: Salicylate Lvl: <7 mg/dL — ABNORMAL LOW (ref 7.0–30.0)

## 2021-11-15 LAB — SARS CORONAVIRUS 2 BY RT PCR: SARS Coronavirus 2 by RT PCR: NEGATIVE

## 2021-11-15 LAB — LIPASE, BLOOD: Lipase: 32 U/L (ref 11–51)

## 2021-11-15 MED ORDER — LIDOCAINE-EPINEPHRINE-TETRACAINE (LET) TOPICAL GEL
3.0000 mL | Freq: Once | TOPICAL | Status: AC
  Administered 2021-11-15: 3 mL via TOPICAL
  Filled 2021-11-15: qty 3

## 2021-11-15 MED ORDER — DOXYCYCLINE HYCLATE 100 MG PO TABS
100.0000 mg | ORAL_TABLET | Freq: Two times a day (BID) | ORAL | Status: DC
  Administered 2021-11-15: 100 mg via ORAL
  Filled 2021-11-15 (×2): qty 1

## 2021-11-15 MED ORDER — DOXYCYCLINE MONOHYDRATE 100 MG PO TABS: 100.0000 mg | ORAL_TABLET | Freq: Two times a day (BID) | ORAL | 0 refills | Status: AC

## 2021-11-15 MED ORDER — ONDANSETRON 4 MG PO TBDP
4.0000 mg | ORAL_TABLET | Freq: Once | ORAL | Status: AC
  Administered 2021-11-15: 4 mg via ORAL
  Filled 2021-11-15: qty 1

## 2021-11-15 MED ORDER — DOXYCYCLINE HYCLATE 100 MG PO TABS: 100.0000 mg | ORAL_TABLET | Freq: Once | ORAL | Status: DC

## 2021-11-15 MED ORDER — CEFADROXIL 500 MG PO CAPS: 500.0000 mg | ORAL_CAPSULE | Freq: Two times a day (BID) | ORAL | 0 refills | Status: AC

## 2021-11-15 MED ORDER — CEFADROXIL 500 MG PO CAPS
500.0000 mg | ORAL_CAPSULE | Freq: Two times a day (BID) | ORAL | Status: DC
  Administered 2021-11-15: 500 mg via ORAL
  Filled 2021-11-15 (×3): qty 1

## 2021-11-15 MED ORDER — IBUPROFEN 600 MG PO TABS
600.0000 mg | ORAL_TABLET | Freq: Once | ORAL | Status: AC
  Administered 2021-11-15: 600 mg via ORAL
  Filled 2021-11-15: qty 1

## 2021-11-15 NOTE — ED Notes (Signed)
Pt. Transferred to BHU , room# 7 from ED .Patient was screened by security before entering the unit. Report to include Situation, Background, Assessment and Recommendations from Kimberlee Nearing, RN . Pt. Oriented to unit including Q15 minute rounds as well as the security cameras for their protection. Patient is alert and oriented, warm and dry in no acute distress.

## 2021-11-15 NOTE — ED Provider Notes (Signed)
[facility] Provider Note    Event Date/Time   First MD Initiated Contact with Patient 11/15/21 [id number]     (approximate)   History   Finger Injury and Mental Health Problem   HPI  Michael Chandler is a 36 y.o. male who reports a past medical history of bipolar disorder and presents for evaluation of gradually worsening thoughts of depression, anxiety, hopelessness, and hearing and seeing auditory visual hallucinations.  This has been going on for extended period of time, apparently weeks, but is gotten much worse recently.  He said that he is supposed to be taking Abilify but has not taken it for months.  He has not been sleeping well for a long time and is growing increasingly hopeless about the situation.  He denies alcohol or drug use.  He denies any current medical problems except that the tip of his right index finger has become very swollen and painful and it is starting to drain pus from around the nail.  He admits that he occasionally bites on his fingernails.     Physical Exam   Triage Vital Signs: ED Triage Vitals [11/15/21 0006]  Enc Vitals Group     BP 109/72     Pulse Rate (!) 101     Resp 18     Temp 98.3 F (36.8 C)     Temp Source Oral     SpO2 99 %     Weight 93 kg (205 lb)     Height 1.956 m (6\' 5" )     Head Circumference      Peak Flow      Pain Score 8     Pain Loc      Pain Edu?      Excl. in GC?     Most recent vital signs: Vitals:   11/15/21 0006 11/15/21 0259  BP: 109/72 127/72  Pulse: (!) 101 90  Resp: 18 16  Temp: 98.3 F (36.8 C) 98.6 F (37 C)  SpO2: 99% 98%     General: Awake, no distress.  Calm and cooperative. CV:  Good peripheral perfusion.  Resp:  Normal effort.  Abd:  No distention.  Psych:  Patient is mentating well and is calm and cooperative.  He mostly wants to be left alone to rest.  He does not directly endorse suicidal ideation but says he feels hopeless as result of the auditory visual  hallucinations.  He admits to medication noncompliance for months. MSK:  Patient has edema and possible cellulitis of the distal phalanx of his right index finger with what appears to be a paronychia at the base of the nail and some actively draining purulence.  There is also some skin desquamation consistent with a gradual worsening of the process over the next day.  Time.  It is not circumferential and does not extend proximally past the DIP joint.        ED Results / Procedures / Treatments   Labs (all labs ordered are listed, but only abnormal results are displayed) Labs Reviewed  COMPREHENSIVE METABOLIC PANEL - Abnormal; Notable for the following components:      Result Value   BUN 21 (*)    Calcium 8.3 (*)    Albumin 3.4 (*)    All other components within normal limits  SALICYLATE LEVEL - Abnormal; Notable for the following components:   Salicylate Lvl <7.0 (*)    All other components within normal limits  ACETAMINOPHEN LEVEL - Abnormal; Notable  for the following components:   Acetaminophen (Tylenol), Serum <10 (*)    All other components within normal limits  CBC - Abnormal; Notable for the following components:   WBC 3.5 (*)    RBC 4.00 (*)    Hemoglobin 10.7 (*)    HCT 33.4 (*)    All other components within normal limits  AEROBIC/ANAEROBIC CULTURE W GRAM STAIN (SURGICAL/DEEP WOUND)  SARS CORONAVIRUS 2 BY RT PCR  ETHANOL  LIPASE, BLOOD  URINE DRUG SCREEN, QUALITATIVE ([facility] ONLY)     RADIOLOGY I viewed and interpreted the patient's right index finger x-rays.  There is no evidence of osteomyelitis or fracture/dislocation.  Radiology report agrees.    PROCEDURES:  Critical Care performed: No  ..Incision and Drainage  Date/Time: 11/15/2021 7:15 AM  Performed by: Loleta Rose, MD Authorized by: Loleta Rose, MD   Consent:    Consent obtained:  Verbal   Consent given by:  Patient   Risks discussed:  Bleeding, infection, incomplete drainage and pain    Alternatives discussed:  Alternative treatment, delayed treatment and observation Universal protocol:    Patient identity confirmed:  Verbally with patient Location:    Type:  Abscess   Size:  1   Location:  Upper extremity   Upper extremity location:  Finger   Finger location:  R index finger Pre-procedure details:    Skin preparation:  Povidone-iodine Sedation:    Sedation type:  None Anesthesia:    Anesthesia method:  Topical application   Topical anesthetic:  LET Procedure type:    Complexity:  Simple Procedure details:    Incision types:  Single straight (incision along top of distal phalanx, as well as parallel to the nail base)   Drainage:  Bloody   Drainage amount:  Moderate   Wound treatment:  Wound left open Post-procedure details:    Procedure completion:  Tolerated well, no immediate complications    MEDICATIONS ORDERED IN ED: Medications  doxycycline (VIBRA-TABS) tablet 100 mg (100 mg Oral Given 11/15/21 0751)  cefadroxil (DURICEF) capsule 500 mg (has no administration in time range)  lidocaine-EPINEPHrine-tetracaine (LET) topical gel (3 mLs Topical Given 11/15/21 0603)  ondansetron (ZOFRAN-ODT) disintegrating tablet 4 mg (4 mg Oral Given 11/15/21 0751)  ibuprofen (ADVIL) tablet 600 mg (600 mg Oral Given 11/15/21 0751)     IMPRESSION / MDM / ASSESSMENT AND PLAN / ED COURSE  I reviewed the triage vital signs and the nursing notes.                              Differential diagnosis includes, but is not limited to, paronychia, felon, cellulitis, osteomyelitis, mood disorder, adjustment disorder, bipolar disorder, schizophrenia, schizoaffective disorder.  Patient's presentation is most consistent with severe exacerbation of chronic illness.  The patient has a history of bipolar disorder and he appears to be having an exacerbation of his chronic mental illness in the setting of medication noncompliance.  He has no medical complaints or concerns except for his right  index finger which is clearly infected and it is already draining purulent material onto the nail from the base, consistent with a draining paronychia.  However the degree of swelling to the DIP would suggest at least an inflammatory if not a cellulitis process.  I ordered an x-ray of the finger and I personally viewed and interpreted.  There is no evidence of osteomyelitis but there is obvious soft tissue swelling.  This assessment is confirmed by  radiology.  As per protocol, I ordered the following labs as part of the patient's medical and psychiatric evaluation:  CBC, CMP, ethanol level, acetaminophen level, salicylate level, urine drug screen, COVID swab.  I also ordered a wound culture for the drainage from material from his finger.  Labs are generally reassuring with no acute abnormalities to suggest an organic cause of his hallucinations.  Surprisingly little purulent material was drained from the paronychia as described above.  Mostly I was able to express blood but no large volume of purulence.  It may be that the majority of the infection has already drained out as it was freely draining before I performed the I&D.  Given that this has been going on for an extended period of time and his finger is significantly swollen, I ordered doxycycline 100 mg p.o. twice daily x10 days with the first dose immediately, as well as cefadroxil for broader spectrum skin flora coverage, 500 mg p.o. twice daily x7 days. Also ordered ibuprofen 600 mg PO.  If the patient is discharged prior to the passage of this amount of time, he should be discharged with the appropriate remainder of this antibiotic regimen.  He could also be given follow-up information with Dr. Stephenie Acres.  I am consulting psychiatry for recommendations. I feel he may meet inpatient behavioral medicine criteria, but he does not meet IVC criteria at this time.  The patient has been placed in psychiatric observation due to the need to provide a safe  environment for the patient while obtaining psychiatric consultation and evaluation, as well as ongoing medical and medication management to treat the patient's condition.  The patient has not been placed under full IVC at this time.      FINAL CLINICAL IMPRESSION(S) / ED DIAGNOSES   Final diagnoses:  Auditory hallucinations  Visual hallucinations  Insomnia, unspecified type  Cellulitis of right index finger  Paronychia of right index finger     Rx / DC Orders   ED Discharge Orders     None        Note:  This document was prepared using Dragon voice recognition software and may include unintentional dictation errors.   Loleta Rose, MD 11/15/21 0830

## 2021-11-15 NOTE — BH Assessment (Signed)
Per BHU RN, patient would like to be discharged. TTS attached [facility] outpatient resources to patient AVS/discharge summary.  Patient called his mother and arranged for her to pick him up at discharge.

## 2021-11-15 NOTE — ED Notes (Signed)
Pt dressed out into hospital scrubs with this nurse and [facility], EDT in room.  Pt belongings placed into hospital bag and labeled appropriately with pt label.  Belongings include : 1  black belt 1 pair shoes 1 pair underwear 1 White tshirt 1 Pair jeans 1 pair red socks 2 pillows 2 black bags with non inventoried items containing clothes and blankets 1 black wallet with non inventoried items 1 cell phone with screen intact

## 2021-11-15 NOTE — ED Notes (Signed)
VOL/consult completed. Recommending psych inpatient admit

## 2021-11-15 NOTE — ED Notes (Signed)
VOL  

## 2021-11-15 NOTE — Discharge Instructions (Signed)
[facility]  OUTPATIENT & CRISIS SERVICES [address] [place], [place] [address] PH: [phone] OR [phone]  OPEN ACCESS  WALK-IN ASSESSMENT HOURS M-W-F 8:OOAM-3:00PM  ADVANCED ACCESS  CRISIS M-F 8:00AM-8:00PM OUTPATIENT SERVICES OFFICE HOURS M-F 8:00 AM-5:00PM

## 2021-11-15 NOTE — ED Notes (Signed)
Dr. York Cerise with pt for I & D of finger abscess. Pt tolerated well. Gauze held by pt for excess draining from finger.

## 2021-11-15 NOTE — ED Notes (Signed)
Breakfast tray and beverage provided 

## 2021-11-15 NOTE — ED Notes (Signed)
Lunch and beverage provided 

## 2021-11-15 NOTE — ED Notes (Signed)
Pt sleeping, unable to obtain vitals.   Will attempt when he awakens

## 2021-11-15 NOTE — ED Provider Notes (Signed)
Patient was seen by psychiatry and is cleared.  He has spoken with his mom and would like to be discharged.  I sent prescriptions for his finger infection to [facility] at his request.   Georga Hacking, MD 11/15/21 [phone]

## 2021-11-15 NOTE — ED Notes (Signed)
Pt given dinner tray and drink 

## 2021-11-15 NOTE — ED Triage Notes (Signed)
Pt reports pain to abd pain x 1 week, and pain to right pointer finger. Denies chest pain Pt states he woke up today and there was an edematous area to cuticle. Pt reports he has gotten purulent drainage out of area. Pt also request psych eval. Reports Endorses A/V hallucinations, inability to sleep x 1 week.  Denies SI/HI  Pt calm and cooperative at this time.

## 2021-11-15 NOTE — BH Assessment (Signed)
Comprehensive Clinical Assessment (CCA) Note  11/15/2021 Michael Chandler [id number]  Chief Complaint:  Chief Complaint  Patient presents with   Finger Injury   Mental Health Problem   Visit Diagnosis: Auditory Hallucinations    CCA Screening, Triage and Referral (STR)  Patient Reported Information How did you hear about [place]? Self  Referral name: No data recorded Referral phone number: No data recorded  Whom do you see for routine medical problems? No data recorded Practice/Facility Name: No data recorded Practice/Facility Phone Number: No data recorded Name of Contact: No data recorded Contact Number: No data recorded Contact Fax Number: No data recorded Prescriber Name: No data recorded Prescriber Address (if known): No data recorded  What Is the Reason for Your Visit/Call Today? "I'm having trouble hearing voices in my head."  How Long Has This Been Causing You Problems? 1-6 months  What Do You Feel Would Help You the Most Today? Medication(s)   Have You Recently Been in Any Inpatient Treatment (Hospital/Detox/Crisis Center/28-Day Program)? No data recorded Name/Location of Program/Hospital:No data recorded How Long Were You There? No data recorded When Were You Discharged? No data recorded  Have You Ever Received Services From Michael Chandler Before? No data recorded Who Do You See at [facility]? No data recorded  Have You Recently Had Any Thoughts About Hurting Yourself? No  Are You Planning to Commit Suicide/Harm Yourself At This time? No   Have you Recently Had Thoughts About Hurting Someone Michael Chandler? No  Explanation: No data recorded  Have You Used Any Alcohol or Drugs in the Past 24 Hours? No  How Long Ago Did You Use Drugs or Alcohol? No data recorded What Did You Use and How Much? No data recorded  Do You Currently Have a Therapist/Psychiatrist? No  Name of Therapist/Psychiatrist: No data recorded  Have You Been Recently Discharged From Any Office  Practice or Programs? No  Explanation of Discharge From Practice/Program: No data recorded    CCA Screening Triage Referral Assessment Type of Contact: Face-to-Face  Is this Initial or Reassessment? No data recorded Date Telepsych consult ordered in [facility]:  No data recorded Time Telepsych consult ordered in [facility]:  No data recorded  Patient Reported Information Reviewed? No data recorded Patient Left Without Being Seen? No data recorded Reason for Not Completing Assessment: No data recorded  Collateral Involvement: No data recorded  Does Patient Have a Court Appointed Legal Guardian? No data recorded Name and Contact of Legal Guardian: No data recorded If Minor and Not Living with Parent(s), Who has Custody? No data recorded Is CPS involved or ever been involved? Never  Is APS involved or ever been involved? Never   Patient Determined To Be At Risk for Harm To Self or Others Based on Review of Patient Reported Information or Presenting Complaint? No  Method: No data recorded Availability of Means: No data recorded Intent: No data recorded Notification Required: No data recorded Additional Information for Danger to Others Potential: No data recorded Additional Comments for Danger to Others Potential: No data recorded Are There Guns or Other Weapons in Your Home? No data recorded Types of Guns/Weapons: No data recorded Are These Weapons Safely Secured?                            No data recorded Who Could Verify You Are Able To Have These Secured: No data recorded Do You Have any Outstanding Charges, Pending Court Dates, Parole/Probation? No data recorded Contacted  To Inform of Risk of Harm To Self or Others: No data recorded  Location of Assessment: [facility]   Does Patient Present under Involuntary Commitment? No  IVC Papers Initial File Date: No data recorded  [place] of Residence: [place]   Patient Currently Receiving the Following Services: Not Receiving  Services   Determination of Need: Urgent (48 hours)   Options For Referral: Therapeutic Triage Services     CCA Biopsychosocial Intake/Chief Complaint:  No data recorded Current Symptoms/Problems: No data recorded  Patient Reported Schizophrenia/Schizoaffective Diagnosis in Past: Yes   Strengths: None Identified  Preferences: No data recorded Abilities: No data recorded  Type of Services Patient Feels are Needed: No data recorded  Initial Clinical Notes/Concerns: No data recorded  Mental Health Symptoms Depression:   Irritability; Hopelessness   Duration of Depressive symptoms:  Greater than two weeks   Mania:   None   Anxiety:    None   Psychosis:   None   Duration of Psychotic symptoms: No data recorded  Trauma:   None   Obsessions:   None   Compulsions:   None   Inattention:   None   Hyperactivity/Impulsivity:   None   Oppositional/Defiant Behaviors:   None   Emotional Irregularity:   None   Other Mood/Personality Symptoms:  No data recorded   Mental Status Exam Appearance and self-care  Stature:   Average   Weight:   Average weight   Clothing:   Age-appropriate   Grooming:   Normal   Cosmetic use:   None   Posture/gait:   Normal   Motor activity:   Not Remarkable   Sensorium  Attention:   Normal   Concentration:   Normal   Orientation:   X5   Recall/memory:   Normal   Affect and Mood  Affect:   Depressed   Mood:   Irritable   Relating  Eye contact:   Avoided   Facial expression:   Angry   Attitude toward examiner:   Irritable; Defensive; Guarded   Thought and Language  Speech flow:  Clear and Coherent   Thought content:   Appropriate to Mood and Circumstances   Preoccupation:   None   Hallucinations:   Auditory   Organization:  No data recorded  [employer] of Knowledge:   Fair   Intelligence:   Average   Abstraction:   Normal   Judgement:   Poor    Reality Testing:   Adequate   Insight:   Poor   Decision Making:   Normal   Social Functioning  Social Maturity:   Irresponsible   Social Judgement:   Heedless   Stress  Stressors:  No data recorded  Coping Ability:   Deficient supports   Skill Deficits:   Responsibility   Supports:   Family     Religion:    Leisure/Recreation:    Exercise/Diet: Exercise/Diet Do You Exercise?: No Have You Gained or Lost A Significant Amount of Weight in the Past Six Months?: No Do You Follow a Special Diet?: No Do You Have Any Trouble Sleeping?: Yes Explanation of Sleeping Difficulties: "none"   CCA Employment/Education Employment/Work Situation: Employment / Work Situation Employment Situation: On disability Has Patient ever Been in Equities trader?: No  Education: Education Is Patient Currently Attending School?: No Did You Product manager?: No Did You Have An Individualized Education Program (IIEP): No Did You Have Any Difficulty At [employer]?: No Patient's Education Has Been Impacted by Current Illness: No  CCA Family/Childhood History Family and Relationship History: Family history Marital status: Single  Childhood History:     Child/Adolescent Assessment: N/A     CCA Substance Use Alcohol/Drug Use: Alcohol / Drug Use Pain Medications: See MAR Prescriptions: See MAR Over the Counter: See MAR History of alcohol / drug use?: No history of alcohol / drug abuse       ASAM's:  Six Dimensions of Multidimensional Assessment  Dimension 1:  Acute Intoxication and/or Withdrawal Potential:      Dimension 2:  Biomedical Conditions and Complications:      Dimension 3:  Emotional, Behavioral, or Cognitive Conditions and Complications:     Dimension 4:  Readiness to Change:     Dimension 5:  Relapse, Continued use, or Continued Problem Potential:     Dimension 6:  Recovery/Living Environment:     ASAM Severity Score:    ASAM Recommended Level of Treatment:      Substance use Disorder (SUD)    Recommendations for Services/Supports/Treatments:    DSM5 Diagnoses: Patient Active Problem List   Diagnosis Date Noted   Auditory hallucinations 11/15/2021   Cleon Dew, Counselor

## 2021-11-15 NOTE — Consult Note (Signed)
[facility] Face-to-Face Psychiatry Consult   Reason for Consult:  AVH  Referring Physician:  Roxan Hockey  Patient Identification: Michael Chandler MRN:  [id number] Principal Diagnosis: Auditory hallucinations Diagnosis:  Principal Problem:   Auditory hallucinations   Total Time spent with patient: 45 minutes  Subjective:   Michael Chandler is a 36 y.o. male patient admitted with AH. He also had finger lanced   HPI:  Patient has presented to the ED several times over the last month with various complaints.   Today, patient is somnolent, does not want to open his eyes for interview. He states that he is "hearing voices."  He states the voices tell him to harm himself, but he has no intent or plan. Denies thoughts of harming anyone else. States that he also has visual hallucinations at times, but not currently. Patient does not appear to be responding to internal stimuli. Patient does not want [place] to contact his mother. He states that he lives with his mother. Reports poor sleep and appetite Explained to patient that we need a UDS. He denies illicit drug use and d"rare" alcohol use. Reports he was previously prescribed Abilify and valium.   Patient has been refusing treatment for HIV. Apparently, per chart note of 10/02/21, patient agreed to referral to internal medicine provider (Dr. Ninetta Lights), who could manage his HIV  Past Psychiatric History: Chart review does not reveal formal diagnosis. Patient states that he has a history of schizophrenia. He states he has been psychiatrically hospitalized in [place] in 2021 for "hearing voices"  Reports suicide attempt by trying to drown himself in  2021.    Risk to Self:   Risk to Others:   Prior Inpatient Therapy:   Prior Outpatient Therapy:    Past Medical History:  Past Medical History:  Diagnosis Date   HIV (human immunodeficiency virus infection) (HCC)    No past surgical history on file. Family History: No family history on file. Family Psychiatric  History:  none reported Social History:  Social History   Substance and Sexual Activity  Alcohol Use Yes     Social History   Substance and Sexual Activity  Drug Use Yes   Types: Methamphetamines, Marijuana, Cocaine    Social History   Socioeconomic History   Marital status: Single    Spouse name: Not on file   Number of children: Not on file   Years of education: Not on file   Highest education level: Not on file  Occupational History   Not on file  Tobacco Use   Smoking status: Every Day   Smokeless tobacco: Never  Substance and Sexual Activity   Alcohol use: Yes   Drug use: Yes    Types: Methamphetamines, Marijuana, Cocaine   Sexual activity: Not on file  Other Topics Concern   Not on file  Social History Narrative   Not on file   Social Determinants of Health   Financial Resource Strain: Not on file  Food Insecurity: Not on file  Transportation Needs: Not on file  Physical Activity: Not on file  Stress: Not on file  Social Connections: Not on file   Additional Social History:    Allergies:   Allergies  Allergen Reactions   Bactrim [Sulfamethoxazole-Trimethoprim] Anaphylaxis    Labs:  Results for orders placed or performed during the hospital encounter of 11/15/21 (from the past 48 hour(s))  Comprehensive metabolic panel     Status: Abnormal   Collection Time: 11/15/21 12:20 AM  Result Value Ref Range  Sodium 137 135 - 145 mmol/L   Potassium 3.9 3.5 - 5.1 mmol/L   Chloride 107 98 - 111 mmol/L   CO2 25 22 - 32 mmol/L   Glucose, Bld 82 70 - 99 mg/dL    Comment: Glucose reference range applies only to samples taken after fasting for at least 8 hours.   BUN 21 (H) 6 - 20 mg/dL   Creatinine, Ser 5.73 0.61 - 1.24 mg/dL   Calcium 8.3 (L) 8.9 - 10.3 mg/dL   Total Protein 8.0 6.5 - 8.1 g/dL   Albumin 3.4 (L) 3.5 - 5.0 g/dL   AST 35 15 - 41 U/L   ALT 24 0 - 44 U/L   Alkaline Phosphatase 126 38 - 126 U/L   Total Bilirubin 0.7 0.3 - 1.2 mg/dL   GFR, Estimated  >22 >02 mL/min    Comment: (NOTE) Calculated using the CKD-EPI Creatinine Equation (2021)    Anion gap 5 5 - 15    Comment: Performed at [facility], [address] [place], [place] [address]  Ethanol     Status: None   Collection Time: 11/15/21 12:20 AM  Result Value Ref Range   Alcohol, Ethyl (B) <10 <10 mg/dL    Comment: (NOTE) Lowest detectable limit for serum alcohol is 10 mg/dL.  For medical purposes only. Performed at [facility], [address] [place], [place] [address]   Salicylate level     Status: Abnormal   Collection Time: 11/15/21 12:20 AM  Result Value Ref Range   Salicylate Lvl <7.0 (L) 7.0 - 30.0 mg/dL    Comment: Performed at [facility], [address] [place], [place] [address]  Acetaminophen level     Status: Abnormal   Collection Time: 11/15/21 12:20 AM  Result Value Ref Range   Acetaminophen (Tylenol), Serum <10 (L) 10 - 30 ug/mL    Comment: (NOTE) Therapeutic concentrations vary significantly. A range of 10-30 ug/mL  may be an effective concentration for many patients. However, some  are best treated at concentrations outside of this range. Acetaminophen concentrations >150 ug/mL at 4 hours after ingestion  and >50 ug/mL at 12 hours after ingestion are often associated with  toxic reactions.  Performed at [facility], [address] [place], [place] [address]   cbc     Status: Abnormal   Collection Time: 11/15/21 12:20 AM  Result Value Ref Range   WBC 3.5 (L) 4.0 - 10.5 K/uL   RBC 4.00 (L) 4.22 - 5.81 MIL/uL   Hemoglobin 10.7 (L) 13.0 - 17.0 g/dL   HCT 07.3 (L) 71.0 - 62.6 %   MCV 83.5 80.0 - 100.0 fL   MCH 26.8 26.0 - 34.0 pg   MCHC 32.0 30.0 - 36.0 g/dL   RDW 94.8 54.6 - 27.0 %   Platelets 257 150 - 400 K/uL   nRBC 0.0 0.0 - 0.2 %    Comment: Performed at [facility], [address] [place], [place] [address]  Lipase, blood     Status: None   Collection Time: 11/15/21 12:20 AM   Result Value Ref Range   Lipase 32 11 - 51 U/L    Comment: Performed at [facility], [address] [place], [place] [address]  Aerobic/Anaerobic Culture w Gram Stain (surgical/deep wound)     Status: None (Preliminary result)   Collection Time: 11/15/21  7:40 AM   Specimen: Joint, Finger; Wound  Result Value Ref Range   Specimen Description  FINGER Performed at [facility], [address], [place], [place] [address]    Special Requests      Normal Performed at [facility], [address] [place], [place] [address]    Gram Stain      RARE WBC PRESENT,BOTH PMN AND MONONUCLEAR FEW GRAM NEGATIVE RODS INTRACELLULAR EXTRACELLULAR Performed at [facility], [address] [place], [place] [address]    Culture PENDING    Report Status PENDING   SARS Coronavirus 2 by RT PCR (hospital order, performed in [facility] hospital lab) *cepheid single result test* Anterior Nasal Swab     Status: None   Collection Time: 11/15/21  9:33 AM   Specimen: Anterior Nasal Swab  Result Value Ref Range   SARS Coronavirus 2 by RT PCR NEGATIVE NEGATIVE    Comment: (NOTE) SARS-CoV-2 target nucleic acids are NOT DETECTED.  The SARS-CoV-2 RNA is generally detectable in upper and lower respiratory specimens during the acute phase of infection. The lowest concentration of SARS-CoV-2 viral copies this assay can detect is 250 copies / mL. A negative result does not preclude SARS-CoV-2 infection and should not be used as the sole basis for treatment or other patient management decisions.  A negative result may occur with improper specimen collection / handling, submission of specimen other than nasopharyngeal swab, presence of viral mutation(s) within the areas targeted by this assay, and inadequate number of viral copies (<250 copies / mL). A negative result must be combined with clinical observations, patient history, and epidemiological information.  Fact  Sheet for Patients:   RoadLapTop.co.za  Fact Sheet for Healthcare Providers: http://kim-miller.com/  This test is not yet approved or  cleared by the [place] FDA and has been authorized for detection and/or diagnosis of SARS-CoV-2 by FDA under an Emergency Use Authorization (EUA).  This EUA will remain in effect (meaning this test can be used) for the duration of the COVID-19 declaration under Section 564(b)(1) of the Act, 21 U.S.C. section 360bbb-3(b)(1), unless the authorization is terminated or revoked sooner.  Performed at [facility], [address] [place], [place] [address]     Current Facility-Administered Medications  Medication Dose Route Frequency Provider Last Rate Last Admin   cefadroxil (DURICEF) capsule 500 mg  500 mg Oral BID Loleta Rose, MD   500 mg at 11/15/21 1222   doxycycline (VIBRA-TABS) tablet 100 mg  100 mg Oral Q12H Loleta Rose, MD   100 mg at 11/15/21 6440   Current Outpatient Medications  Medication Sig Dispense Refill   famotidine (PEPCID) 20 MG tablet Take 1 tablet (20 mg total) by mouth 2 (two) times daily for 15 days. 30 tablet 0   fluconazole (DIFLUCAN) 100 MG tablet Take 1 tablet (100 mg total) by mouth daily for 14 days. 14 tablet 0   ondansetron (ZOFRAN-ODT) 8 MG disintegrating tablet Take 1 tablet (8 mg total) by mouth every 8 (eight) hours as needed for nausea or vomiting. 12 tablet 0   polyethylene glycol (MIRALAX) 17 g packet Take 17 g by mouth daily. 14 each 0    Musculoskeletal: Strength & Muscle Tone: within normal limits Gait & Station: normal Patient leans: N/A    Psychiatric Specialty Exam:  Presentation  General Appearance: Disheveled  Eye Contact:Minimal  Speech:Garbled  Speech Volume:Decreased  Handedness:No data recorded  Mood and Affect  Mood:-- (sleepy)  Affect:Blunt   Thought Process  Thought Processes:Coherent  Descriptions of  Associations:Intact  Orientation:Full (Time, Place and Person)  Thought  Content:WDL  History of Schizophrenia/Schizoaffective disorder:No data recorded Duration of Psychotic Symptoms:No data recorded Hallucinations:Hallucinations: Visual; Auditory Description of Auditory Hallucinations: "To hurt myself' Description of Visual Hallucinations: vague  Ideas of Reference:Delusions  Suicidal Thoughts:Suicidal Thoughts: No  Homicidal Thoughts:Homicidal Thoughts: No   Sensorium  Memory:Immediate Poor  Judgment:Fair  Insight:Poor   Executive Functions  Concentration:Poor  Attention Span:Poor  Recall:Poor  Fund of Knowledge:Poor  Language:Poor   Psychomotor Activity  Psychomotor Activity:Psychomotor Activity: Normal   Assets  Assets:Financial Resources/Insurance; Resilience   Sleep  Sleep:Sleep: Poor   Physical Exam: Physical Exam Vitals and nursing note reviewed.  Constitutional:      Comments: sleepy  HENT:     Head: Normocephalic.     Nose: No congestion or rhinorrhea.  Eyes:     General:        Right eye: No discharge.        Left eye: No discharge.  Cardiovascular:     Rate and Rhythm: Normal rate.  Pulmonary:     Effort: Pulmonary effort is normal.  Musculoskeletal:        General: Normal range of motion.     Cervical back: Normal range of motion.  Skin:    General: Skin is dry.  Neurological:     Mental Status: He is oriented to person, place, and time.  Psychiatric:        Attention and Perception: He is inattentive.        Mood and Affect: Affect is blunt.        Speech: Speech is delayed.        Behavior: Behavior is cooperative.        Thought Content: Thought content is delusional. Thought content is not paranoid. Thought content does not include homicidal or suicidal ideation.        Cognition and Memory: Cognition normal.        Judgment: Judgment is impulsive.    Review of Systems  HENT: Negative.    Respiratory: Negative.     Cardiovascular: Negative.   Psychiatric/Behavioral:  Positive for depression. Negative for suicidal ideas.    Blood pressure 108/64, pulse 78, temperature 98.5 F (36.9 C), temperature source Oral, resp. rate 16, height 6\' 5"  (1.956 m), weight 93 kg, SpO2 99 %. Body mass index is 24.31 kg/m.  Treatment Plan Summary: Daily contact with patient to assess and evaluate symptoms and progress in treatment and Medication management. Reviewed with EDP  Disposition: Recommend psychiatric Inpatient admission when medically cleared.  , NP 11/15/2021 1:35 PM

## 2021-11-15 NOTE — ED Notes (Signed)
Pt is A/Ox 3, Mr Galdamez declines any SI/HI stated that he is not having any A/V hallucinations.  Discharge instructions reviewed with Mr Turnley, he verbalized understanding.  All Belongings accounted for and returned to PT.  Pt left ambulatory via POV

## 2021-11-20 LAB — AEROBIC/ANAEROBIC CULTURE W GRAM STAIN (SURGICAL/DEEP WOUND): Special Requests: NORMAL

## 2021-12-02 ENCOUNTER — Other Ambulatory Visit: Payer: Self-pay

## 2021-12-02 DIAGNOSIS — Z21 Asymptomatic human immunodeficiency virus [HIV] infection status: Secondary | ICD-10-CM | POA: Diagnosis not present

## 2021-12-02 DIAGNOSIS — R1084 Generalized abdominal pain: Secondary | ICD-10-CM | POA: Diagnosis not present

## 2021-12-02 DIAGNOSIS — R197 Diarrhea, unspecified: Secondary | ICD-10-CM | POA: Insufficient documentation

## 2021-12-02 DIAGNOSIS — R1031 Right lower quadrant pain: Secondary | ICD-10-CM | POA: Diagnosis present

## 2021-12-02 LAB — COMPREHENSIVE METABOLIC PANEL
ALT: 19 U/L (ref 0–44)
AST: 27 U/L (ref 15–41)
Albumin: 3.5 g/dL (ref 3.5–5.0)
Alkaline Phosphatase: 115 U/L (ref 38–126)
Anion gap: 4 — ABNORMAL LOW (ref 5–15)
BUN: 14 mg/dL (ref 6–20)
CO2: 26 mmol/L (ref 22–32)
Calcium: 8.6 mg/dL — ABNORMAL LOW (ref 8.9–10.3)
Chloride: 106 mmol/L (ref 98–111)
Creatinine, Ser: 1.06 mg/dL (ref 0.61–1.24)
GFR, Estimated: >60 mL/min (ref >60)
Glucose, Bld: 114 mg/dL — ABNORMAL HIGH (ref 70–99)
Potassium: 4 mmol/L (ref 3.5–5.1)
Sodium: 136 mmol/L (ref 135–145)
Total Bilirubin: 0.3 mg/dL (ref 0.3–1.2)
Total Protein: 7.8 g/dL (ref 6.5–8.1)

## 2021-12-02 LAB — LIPASE, BLOOD: Lipase: 37 U/L (ref 11–51)

## 2021-12-02 LAB — CBC
HCT: 33.9 % — ABNORMAL LOW (ref 39.0–52.0)
Hemoglobin: 10.9 g/dL — ABNORMAL LOW (ref 13.0–17.0)
MCH: 27.3 pg (ref 26.0–34.0)
MCHC: 32.2 g/dL (ref 30.0–36.0)
MCV: 84.8 fL (ref 80.0–100.0)
Platelets: 281 10*3/uL (ref 150–400)
RBC: 4 MIL/uL — ABNORMAL LOW (ref 4.22–5.81)
RDW: 13.4 % (ref 11.5–15.5)
WBC: 4.9 10*3/uL (ref 4.0–10.5)
nRBC: 0 % (ref 0.0–0.2)

## 2021-12-02 LAB — URINALYSIS, ROUTINE W REFLEX MICROSCOPIC
Bacteria, UA: NONE SEEN
Bilirubin Urine: NEGATIVE
Glucose, UA: NEGATIVE mg/dL
Hgb urine dipstick: NEGATIVE
Ketones, ur: NEGATIVE mg/dL
Leukocytes,Ua: NEGATIVE
Nitrite: NEGATIVE
Protein, ur: 30 mg/dL — AB
Specific Gravity, Urine: 1.023 (ref 1.005–1.030)
pH: 6 (ref 5.0–8.0)

## 2021-12-02 NOTE — ED Triage Notes (Signed)
Pt to ED via POV, pt states umbilical abd pain. Pt states has been seen for same. Pt states was given nausea medication. Pt A&O x4. Pt states some days pain is not as bad and he is in "good condition" and some days pain is unbearable.

## 2021-12-03 ENCOUNTER — Emergency Department: Payer: Medicare Other

## 2021-12-03 ENCOUNTER — Emergency Department
Admission: EM | Admit: 2021-12-03 | Discharge: 2021-12-03 | Disposition: A | Discharge status: Home / Self Care | Payer: Medicare Other | Attending: Emergency Medicine | Admitting: Emergency Medicine

## 2021-12-03 DIAGNOSIS — R1084 Generalized abdominal pain: Secondary | ICD-10-CM | POA: Diagnosis not present

## 2021-12-03 DIAGNOSIS — R197 Diarrhea, unspecified: Secondary | ICD-10-CM

## 2021-12-03 MED ORDER — KETOROLAC TROMETHAMINE 15 MG/ML IJ SOLN
15.0000 mg | Freq: Once | INTRAMUSCULAR | Status: AC
  Administered 2021-12-03: 15 mg via INTRAVENOUS
  Filled 2021-12-03: qty 1

## 2021-12-03 MED ORDER — DICYCLOMINE HCL 10 MG PO CAPS: 10.0000 mg | ORAL_CAPSULE | Freq: Three times a day (TID) | ORAL | 0 refills | Status: DC

## 2021-12-03 MED ORDER — LACTATED RINGERS IV BOLUS
1000.0000 mL | Freq: Once | INTRAVENOUS | Status: AC
  Administered 2021-12-03: 1000 mL via INTRAVENOUS

## 2021-12-03 MED ORDER — IOHEXOL 300 MG/ML  SOLN
100.0000 mL | Freq: Once | INTRAMUSCULAR | Status: AC | PRN
Start: 2021-12-03 — End: 2021-12-03
  Administered 2021-12-03: 100 mL via INTRAVENOUS

## 2021-12-03 NOTE — ED Provider Notes (Signed)
[facility] Provider Note    Event Date/Time   First MD Initiated Contact with Patient 12/03/21 0214     (approximate)   History   Abdominal Pain   HPI  Michael Chandler is a 36 y.o. male with a past medical history of HIV, schizophrenia, and recent ED observation for hallucinations who presents for evaluation of some acute on subacute periumbilical and right lower quadrant Donnell pain.  He states started a month ago but has been worsening.  He also endorsed a couple days of nonbloody diarrhea.  No burning with urination.  No nausea, vomiting, chest pain, cough, shortness of breath, rash or other clear sick symptoms.  He denies any significant NSAID use or EtOH use.     Past Medical History:  Diagnosis Date   HIV (human immunodeficiency virus infection) [facility])      Physical Exam  Triage Vital Signs: ED Triage Vitals  Enc Vitals Group     BP 12/02/21 2012 (!) 141/71     Pulse Rate 12/02/21 2012 (!) 104     Resp 12/02/21 2012 18     Temp 12/02/21 2012 97.9 F (36.6 C)     Temp Source 12/02/21 2012 Oral     SpO2 12/02/21 2012 99 %     Weight 12/02/21 2009 194 lb (88 kg)     Height 12/02/21 2009 6\' 5"  (1.956 m)     Head Circumference --      Peak Flow --      Pain Score 12/02/21 2011 9     Pain Loc --      Pain Edu? --      Excl. in GC? --     Most recent vital signs: Vitals:   12/03/21 0225 12/03/21 0445  BP: 111/65 104/66  Pulse: 80 77  Resp: 16 18  Temp: 97.9 F (36.6 C)   SpO2: 100% 100%    General: Awake, no distress.  CV:  Good peripheral perfusion.  Resp:  Normal effort.  Abd:  No distention.  Mildly tender throughout slightly more in the right lower quadrant.  He is not peritonitic.  No CVA tenderness. Other:    ED Results / Procedures / Treatments  Labs (all labs ordered are listed, but only abnormal results are displayed) Labs Reviewed  COMPREHENSIVE METABOLIC PANEL - Abnormal; Notable for the following components:       Result Value   Glucose, Bld 114 (*)    Calcium 8.6 (*)    Anion gap 4 (*)    All other components within normal limits  CBC - Abnormal; Notable for the following components:   RBC 4.00 (*)    Hemoglobin 10.9 (*)    HCT 33.9 (*)    All other components within normal limits  URINALYSIS, ROUTINE W REFLEX MICROSCOPIC - Abnormal; Notable for the following components:   Color, Urine YELLOW (*)    APPearance CLEAR (*)    Protein, ur 30 (*)    All other components within normal limits  GASTROINTESTINAL PANEL BY PCR, STOOL (REPLACES STOOL CULTURE)  URINE CULTURE  LIPASE, BLOOD     EKG   RADIOLOGY CT abdomen pelvis on my interpretation without evidence of an appendicitis, diverticulitis, kidney stone, pancreatitis or other acute abdominal or pelvic process.  I reviewed radiology's interpretation and agree with her findings, some prominence of the hepatic portal triad unchanged from prior CT from 7/18 with normal-appearing gallbladder and CBD as well as normal appendix and no other  acute process.  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   PROCEDURES:  Critical Care performed: No  Procedures    MEDICATIONS ORDERED IN ED: Medications  ketorolac (TORADOL) 15 MG/ML injection 15 mg (15 mg Intravenous Given 12/03/21 0323)  lactated ringers bolus 1,000 mL (0 mLs Intravenous Stopped 12/03/21 0451)  iohexol (OMNIPAQUE) 300 MG/ML solution 100 mL (100 mLs Intravenous Contrast Given 12/03/21 0337)     IMPRESSION / MDM / ASSESSMENT AND PLAN / ED COURSE  I reviewed the triage vital signs and the nursing notes. Patient's presentation is most consistent with acute presentation with potential threat to life or bodily function.                               Differential diagnosis includes, but is not limited to colitis, kidney stone, appendicitis, diverticulitis.  UA has some protein but otherwise does not appear infected.  Lipase is WNL and not suggestive of  pancreatitis.  CMP without any significant lecture metabolic derangements or evidence of acute cholestatic process or hepatitis.  CBC with WC count 4.9 and hemoglobin of 10.9 compared to 10.72 weeks ago with normal platelets.  CT abdomen pelvis on my interpretation without evidence of an appendicitis, diverticulitis, kidney stone, pancreatitis or other acute abdominal or pelvic process.  I reviewed radiology's interpretation and agree with her findings, some prominence of the hepatic portal triad unchanged from prior CT from 7/18 with normal-appearing gallbladder and CBD as well as normal appendix and no other acute process.  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  I suspect possible colitis.  Patient unable to provide a stool sample after 8 hours emergency room.  Will write Rx for some Bentyl for his crampy pain.  Advise close outpatient PCP follow-up.  Discharged in stable condition.     FINAL CLINICAL IMPRESSION(S) / ED DIAGNOSES   Final diagnoses:  Generalized abdominal pain  Diarrhea, unspecified type     Rx / DC Orders   ED Discharge Orders          Ordered    dicyclomine (BENTYL) 10 MG capsule  3 times daily before meals & bedtime        12/03/21 0431             Note:  This document was prepared using Dragon voice recognition software and may include unintentional dictation errors.   Gilles Chiquito, MD 12/03/21 [phone]

## 2021-12-03 NOTE — Discharge Instructions (Addendum)
Your CT today showed: IMPRESSION: 1. Unchanged prominence of hepatic portal triads, raising the possibility of mild periportal edema (nonspecific) or mild intrahepatic ductal dilatation. But the gallbladder and CBD appear normal, along with background liver enhancement. 2. Normal appendix. And no other acute or inflammatory process identified in the abdomen or pelvis. 3. 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

## 2021-12-03 NOTE — ED Notes (Signed)
Pt does not want to lay on back as he says it causes him pain.

## 2021-12-04 LAB — URINE CULTURE

## 2021-12-16 ENCOUNTER — Other Ambulatory Visit: Payer: Self-pay

## 2021-12-16 ENCOUNTER — Emergency Department (EMERGENCY_DEPARTMENT_HOSPITAL)
Admission: EM | Admit: 2021-12-16 | Discharge: 2021-12-17 | Disposition: A | Discharge status: Home / Self Care | Payer: Medicare Other | Source: Home / Self Care | Attending: Emergency Medicine | Admitting: Emergency Medicine

## 2021-12-16 DIAGNOSIS — F203 Undifferentiated schizophrenia: Secondary | ICD-10-CM | POA: Insufficient documentation

## 2021-12-16 DIAGNOSIS — Z20822 Contact with and (suspected) exposure to covid-19: Secondary | ICD-10-CM | POA: Insufficient documentation

## 2021-12-16 DIAGNOSIS — R45851 Suicidal ideations: Secondary | ICD-10-CM | POA: Insufficient documentation

## 2021-12-16 DIAGNOSIS — F1510 Other stimulant abuse, uncomplicated: Secondary | ICD-10-CM | POA: Insufficient documentation

## 2021-12-16 DIAGNOSIS — F200 Paranoid schizophrenia: Secondary | ICD-10-CM | POA: Diagnosis not present

## 2021-12-16 DIAGNOSIS — F29 Unspecified psychosis not due to a substance or known physiological condition: Secondary | ICD-10-CM | POA: Insufficient documentation

## 2021-12-16 DIAGNOSIS — F23 Brief psychotic disorder: Secondary | ICD-10-CM

## 2021-12-16 HISTORY — DX: Schizophrenia, unspecified: F20.9

## 2021-12-16 LAB — COMPREHENSIVE METABOLIC PANEL
ALT: 21 U/L (ref 0–44)
AST: 28 U/L (ref 15–41)
Albumin: 3.6 g/dL (ref 3.5–5.0)
Alkaline Phosphatase: 129 U/L — ABNORMAL HIGH (ref 38–126)
Anion gap: 5 (ref 5–15)
BUN: 14 mg/dL (ref 6–20)
CO2: 28 mmol/L (ref 22–32)
Calcium: 8.8 mg/dL — ABNORMAL LOW (ref 8.9–10.3)
Chloride: 103 mmol/L (ref 98–111)
Creatinine, Ser: 0.79 mg/dL (ref 0.61–1.24)
GFR, Estimated: >60 mL/min (ref >60)
Glucose, Bld: 87 mg/dL (ref 70–99)
Potassium: 4 mmol/L (ref 3.5–5.1)
Sodium: 136 mmol/L (ref 135–145)
Total Bilirubin: 0.6 mg/dL (ref 0.3–1.2)
Total Protein: 7.9 g/dL (ref 6.5–8.1)

## 2021-12-16 LAB — URINE DRUG SCREEN, QUALITATIVE (ARMC ONLY)
Amphetamines, Ur Screen: POSITIVE — AB
Barbiturates, Ur Screen: NOT DETECTED
Benzodiazepine, Ur Scrn: NOT DETECTED
Cannabinoid 50 Ng, Ur ~~LOC~~: NOT DETECTED
Cocaine Metabolite,Ur ~~LOC~~: NOT DETECTED
MDMA (Ecstasy)Ur Screen: NOT DETECTED
Methadone Scn, Ur: NOT DETECTED
Opiate, Ur Screen: NOT DETECTED
Phencyclidine (PCP) Ur S: NOT DETECTED
Tricyclic, Ur Screen: NOT DETECTED

## 2021-12-16 LAB — SALICYLATE LEVEL: Salicylate Lvl: <7 mg/dL — ABNORMAL LOW (ref 7.0–30.0)

## 2021-12-16 LAB — CBC
HCT: 35 % — ABNORMAL LOW (ref 39.0–52.0)
Hemoglobin: 11.2 g/dL — ABNORMAL LOW (ref 13.0–17.0)
MCH: 26.9 pg (ref 26.0–34.0)
MCHC: 32 g/dL (ref 30.0–36.0)
MCV: 84.1 fL (ref 80.0–100.0)
Platelets: 258 10*3/uL (ref 150–400)
RBC: 4.16 MIL/uL — ABNORMAL LOW (ref 4.22–5.81)
RDW: 13.8 % (ref 11.5–15.5)
WBC: 4.5 10*3/uL (ref 4.0–10.5)
nRBC: 0 % (ref 0.0–0.2)

## 2021-12-16 LAB — ETHANOL: Alcohol, Ethyl (B): <10 mg/dL (ref <10)

## 2021-12-16 LAB — RESP PANEL BY RT-PCR (FLU A&B, COVID) ARPGX2
Influenza A by PCR: NEGATIVE
Influenza B by PCR: NEGATIVE
SARS Coronavirus 2 by RT PCR: NEGATIVE

## 2021-12-16 LAB — ACETAMINOPHEN LEVEL: Acetaminophen (Tylenol), Serum: <10 ug/mL — ABNORMAL LOW (ref 10–30)

## 2021-12-16 MED ORDER — LIDOCAINE VISCOUS HCL 2 % MT SOLN
15.0000 mL | Freq: Once | OROMUCOSAL | Status: AC
  Administered 2021-12-16: 15 mL via OROMUCOSAL
  Filled 2021-12-16: qty 15

## 2021-12-16 MED ORDER — HALOPERIDOL 5 MG PO TABS
5.0000 mg | ORAL_TABLET | Freq: Two times a day (BID) | ORAL | Status: DC
  Administered 2021-12-16 – 2021-12-17 (×2): 5 mg via ORAL
  Filled 2021-12-16 (×2): qty 1

## 2021-12-16 MED ORDER — ALUM & MAG HYDROXIDE-SIMETH 200-200-20 MG/5ML PO SUSP
30.0000 mL | Freq: Once | ORAL | Status: AC
  Administered 2021-12-16: 30 mL via ORAL
  Filled 2021-12-16: qty 30

## 2021-12-16 MED ORDER — TRAZODONE HCL 100 MG PO TABS
50.0000 mg | ORAL_TABLET | Freq: Every evening | ORAL | Status: DC | PRN
  Administered 2021-12-16: 50 mg via ORAL
  Filled 2021-12-16: qty 1

## 2021-12-16 MED ORDER — ACETAMINOPHEN 500 MG PO TABS
1000.0000 mg | ORAL_TABLET | Freq: Once | ORAL | Status: AC
  Administered 2021-12-16: 1000 mg via ORAL
  Filled 2021-12-16: qty 2

## 2021-12-16 MED ORDER — BENZTROPINE MESYLATE 1 MG PO TABS
1.0000 mg | ORAL_TABLET | Freq: Every day | ORAL | Status: DC
  Administered 2021-12-16 – 2021-12-17 (×2): 1 mg via ORAL
  Filled 2021-12-16 (×2): qty 1

## 2021-12-16 NOTE — Consult Note (Signed)
[facility] Face-to-Face Psychiatry Consult   Reason for Consult:  psychosis  Referring Physician:  EDP Patient Identification: Michael Chandler MRN:  [id number] Principal Diagnosis: Schizophrenia, undifferentiated (HCC) Diagnosis:  Principal Problem:   Schizophrenia, undifferentiated (HCC) Active Problems:   Methamphetamine abuse (HCC)   Total Time spent with patient: 45 minutes  Subjective:   Michael Chandler is a 36 y.o. male patient admitted with psychosis.  HPI:  36 yo male who presented under IVC with hallucinations, off his medications.  Per his mother and notes, he typically use methamphetamine which make his symptoms worse.  However, he has not in the past few days and he has been having arguments with his hallucinations with some suicidal statements at home.  He reports not having schizophrenia but PTSD and OCD.  Denies taking medications and substance use.  He explains his behaviors as he was talking on the phone, not to visual hallucinations.  He is hyperverbal on assessment and difficult to verbally redirect.  His mother reports his last psychiatrist wanted him to be on Haldol.  He will need admission with medications started and possibly a long-term antipsychotic like Haldol dec.  Past Psychiatric History: schizophrenia, OCD, PTSD  Risk to Self:  yes Risk to Others:  none Prior Inpatient Therapy:  several Prior Outpatient Therapy:  none currently  Past Medical History:  Past Medical History:  Diagnosis Date   HIV (human immunodeficiency virus infection) (HCC)    Schizophrenia (HCC)    per IVC paperwork, pt states does not have this dx   History reviewed. No pertinent surgical history. Family History: History reviewed. No pertinent family history. Family Psychiatric  History: none Social History:  Social History   Substance and Sexual Activity  Alcohol Use Yes   Comment: occasional     Social History   Substance and Sexual Activity  Drug Use Yes   Types: Methamphetamines,  Marijuana, Cocaine   Comment: pt denies at this time    Social History   Socioeconomic History   Marital status: Single    Spouse name: Not on file   Number of children: Not on file   Years of education: Not on file   Highest education level: Not on file  Occupational History   Not on file  Tobacco Use   Smoking status: Every Day   Smokeless tobacco: Never  Substance and Sexual Activity   Alcohol use: Yes    Comment: occasional   Drug use: Yes    Types: Methamphetamines, Marijuana, Cocaine    Comment: pt denies at this time   Sexual activity: Not on file  Other Topics Concern   Not on file  Social History Narrative   Not on file   Social Determinants of Health   Financial Resource Strain: Not on file  Food Insecurity: Not on file  Transportation Needs: Not on file  Physical Activity: Not on file  Stress: Not on file  Social Connections: Not on file   Additional Social History:    Allergies:   Allergies  Allergen Reactions   Bactrim [Sulfamethoxazole-Trimethoprim] Anaphylaxis    Labs:  Results for orders placed or performed during the hospital encounter of 12/16/21 (from the past 48 hour(s))  Comprehensive metabolic panel     Status: Abnormal   Collection Time: 12/16/21  3:36 PM  Result Value Ref Range   Sodium 136 135 - 145 mmol/L   Potassium 4.0 3.5 - 5.1 mmol/L   Chloride 103 98 - 111 mmol/L   CO2 28 22 -  32 mmol/L   Glucose, Bld 87 70 - 99 mg/dL    Comment: Glucose reference range applies only to samples taken after fasting for at least 8 hours.   BUN 14 6 - 20 mg/dL   Creatinine, Ser 0.34 0.61 - 1.24 mg/dL   Calcium 8.8 (L) 8.9 - 10.3 mg/dL   Total Protein 7.9 6.5 - 8.1 g/dL   Albumin 3.6 3.5 - 5.0 g/dL   AST 28 15 - 41 U/L   ALT 21 0 - 44 U/L   Alkaline Phosphatase 129 (H) 38 - 126 U/L   Total Bilirubin 0.6 0.3 - 1.2 mg/dL   GFR, Estimated >91 >79 mL/min    Comment: (NOTE) Calculated using the CKD-EPI Creatinine Equation (2021)    Anion gap 5  5 - 15    Comment: Performed at [facility], [address] [place], [place] [address]  Ethanol     Status: None   Collection Time: 12/16/21  3:36 PM  Result Value Ref Range   Alcohol, Ethyl (B) <10 <10 mg/dL    Comment: (NOTE) Lowest detectable limit for serum alcohol is 10 mg/dL.  For medical purposes only. Performed at [facility], [address] [place], [place] [address]   Salicylate level     Status: Abnormal   Collection Time: 12/16/21  3:36 PM  Result Value Ref Range   Salicylate Lvl <7.0 (L) 7.0 - 30.0 mg/dL    Comment: Performed at [facility], [address] [place], [place] [address]  Acetaminophen level     Status: Abnormal   Collection Time: 12/16/21  3:36 PM  Result Value Ref Range   Acetaminophen (Tylenol), Serum <10 (L) 10 - 30 ug/mL    Comment: (NOTE) Therapeutic concentrations vary significantly. A range of 10-30 ug/mL  may be an effective concentration for many patients. However, some  are best treated at concentrations outside of this range. Acetaminophen concentrations >150 ug/mL at 4 hours after ingestion  and >50 ug/mL at 12 hours after ingestion are often associated with  toxic reactions.  Performed at [facility], [address] [place], [place] [address]   cbc     Status: Abnormal   Collection Time: 12/16/21  3:36 PM  Result Value Ref Range   WBC 4.5 4.0 - 10.5 K/uL   RBC 4.16 (L) 4.22 - 5.81 MIL/uL   Hemoglobin 11.2 (L) 13.0 - 17.0 g/dL   HCT 70.7 (L) 86.7 - 54.4 %   MCV 84.1 80.0 - 100.0 fL   MCH 26.9 26.0 - 34.0 pg   MCHC 32.0 30.0 - 36.0 g/dL   RDW 92.0 10.0 - 71.2 %   Platelets 258 150 - 400 K/uL   nRBC 0.0 0.0 - 0.2 %    Comment: Performed at [facility], [address] [place], [place] [address]  Urine Drug Screen, Qualitative     Status: Abnormal   Collection Time: 12/16/21  3:36 PM  Result Value Ref Range   Tricyclic, Ur Screen NONE DETECTED NONE DETECTED   Amphetamines,  Ur Screen POSITIVE (A) NONE DETECTED   MDMA (Ecstasy)Ur Screen NONE DETECTED NONE DETECTED   Cocaine Metabolite,Ur [place] NONE DETECTED NONE DETECTED   Opiate, Ur Screen NONE DETECTED NONE DETECTED   Phencyclidine (PCP) Ur S NONE DETECTED NONE DETECTED   Cannabinoid 50 Ng, Ur [place] NONE DETECTED NONE DETECTED   Barbiturates, Ur Screen NONE DETECTED NONE DETECTED   Benzodiazepine, Ur Scrn NONE DETECTED NONE DETECTED  Methadone Scn, Ur NONE DETECTED NONE DETECTED    Comment: (NOTE) Tricyclics + metabolites, urine    Cutoff 1000 ng/mL Amphetamines + metabolites, urine  Cutoff 1000 ng/mL MDMA (Ecstasy), urine              Cutoff 500 ng/mL Cocaine Metabolite, urine          Cutoff 300 ng/mL Opiate + metabolites, urine        Cutoff 300 ng/mL Phencyclidine (PCP), urine         Cutoff 25 ng/mL Cannabinoid, urine                 Cutoff 50 ng/mL Barbiturates + metabolites, urine  Cutoff 200 ng/mL Benzodiazepine, urine              Cutoff 200 ng/mL Methadone, urine                   Cutoff 300 ng/mL  The urine drug screen provides only a preliminary, unconfirmed analytical test result and should not be used for non-medical purposes. Clinical consideration and professional judgment should be applied to any positive drug screen result due to possible interfering substances. A more specific alternate chemical method must be used in order to obtain a confirmed analytical result. Gas chromatography / mass spectrometry (GC/MS) is the preferred confirm atory method. Performed at [facility], [address] [place], [place] [address]     No current facility-administered medications for this encounter.   Current Outpatient Medications  Medication Sig Dispense Refill   dicyclomine (BENTYL) 10 MG capsule Take 1 capsule (10 mg total) by mouth 4 (four) times daily -  before meals and at bedtime for 3 days. 12 capsule 0   famotidine (PEPCID) 20 MG tablet Take 1 tablet (20 mg total) by mouth 2  (two) times daily for 15 days. 30 tablet 0   ondansetron (ZOFRAN-ODT) 8 MG disintegrating tablet Take 1 tablet (8 mg total) by mouth every 8 (eight) hours as needed for nausea or vomiting. 12 tablet 0   polyethylene glycol (MIRALAX) 17 g packet Take 17 g by mouth daily. 14 each 0    Musculoskeletal: Strength & Muscle Tone: within normal limits Gait & Station: normal Patient leans: N/A            Psychiatric Specialty Exam:  Presentation  General Appearance: Disheveled  Eye Contact:Minimal  Speech:Garbled  Speech Volume:Decreased  Handedness:No data recorded  Mood and Affect  Mood:-- (sleepy)  Affect:Blunt   Thought Process  Thought Processes:Coherent  Descriptions of Associations:Intact  Orientation:Full (Time, Place and Person)  Thought Content:WDL  History of Schizophrenia/Schizoaffective disorder:Yes  Duration of Psychotic Symptoms:No data recorded Hallucinations:No data recorded Ideas of Reference:Delusions  Suicidal Thoughts:No data recorded Homicidal Thoughts:No data recorded  Sensorium  Memory:Immediate Poor  Judgment:Fair  Insight:Poor   Executive Functions  Concentration:Poor  Attention Span:Poor  Recall:Poor  Fund of Knowledge:Poor  Language:Poor   Psychomotor Activity  Psychomotor Activity:No data recorded  Assets  Assets:Financial Resources/Insurance; Resilience   Sleep  Sleep:No data recorded  Physical Exam: Physical Exam Vitals and nursing note reviewed.  Constitutional:      Appearance: Normal appearance.  HENT:     Head: Normocephalic.     Nose: Nose normal.  Pulmonary:     Effort: Pulmonary effort is normal.  Musculoskeletal:        General: Normal range of motion.     Cervical back: Normal range of motion.  Neurological:     General: No  focal deficit present.     Mental Status: He is alert and oriented to person, place, and time.  Psychiatric:        Attention and Perception: He is inattentive.  He perceives auditory hallucinations.        Mood and Affect: Mood is anxious.        Speech: Speech normal.        Behavior: Behavior normal. Behavior is cooperative.        Thought Content: Thought content is delusional.        Cognition and Memory: Cognition and memory normal.        Judgment: Judgment is impulsive and inappropriate.    Review of Systems  Musculoskeletal:  Positive for back pain.  Psychiatric/Behavioral:  Positive for hallucinations and substance abuse. The patient is nervous/anxious.   All other systems reviewed and are negative.  Blood pressure 123/78, pulse 98, temperature 97.8 F (36.6 C), temperature source Oral, resp. rate 16, height 6\' 5"  (1.956 m), weight 90.7 kg, SpO2 100 %. Body mass index is 23.72 kg/m.  Treatment Plan Summary: Daily contact with patient to assess and evaluate symptoms and progress in treatment, Medication management, and Plan : Schizophrenia, undifferentiated: Started Haldol 5 mg BID  EPS: Started Cogentin 1 mg daily  INsomnia: Started Trazodone 50  mg daily at bedtime PRN  Disposition: Recommend psychiatric Inpatient admission when medically cleared.  , NP 12/16/2021 5:19 PM

## 2021-12-16 NOTE — ED Notes (Signed)
Pt c/o "stomach ache." Dr Vicente Males notified.

## 2021-12-16 NOTE — ED Notes (Signed)
Hospital meal provided, pt tolerated w/o complaints.  Waste discarded appropriately.  

## 2021-12-16 NOTE — ED Triage Notes (Addendum)
Pt to ED via [facility] with IVC paperwork stating that mother called police after pt was having auditory and visual hallucinations, hearing voices, threatening to hurt self with knives that has access to, uses illicit drugs. IVC papers state pt has schizophrenia and bipolar dx, which pt denies. Pt moved back to [place] from [place] in June, living with mother. Per IVC paperwork has not been taking prescribed meds or followed with counselor. Pt is currently calm, cooperative and denies SI and HI at this time.   Pt belongings include: White t shirt Black sneakers Black cellphone Black shorts White and red socks Green and black boxer shorts

## 2021-12-16 NOTE — BH Assessment (Signed)
Comprehensive Clinical Assessment (CCA) Note  12/16/2021 Michael Chandler [id number]  Chief Complaint:  Chief Complaint  Patient presents with   IVC   Visit Diagnosis: Schizophrenia   Michael Chandler is a 36 year old male who presents to the ER after his mother petitioned for him to be under IVC. Per the patient, his mother misunderstood a phone conversation he was having with a friend from last night. He states he and the friend had an argument and he told them they weren't going to kill themselves over them. He then stated he can be dramatic and he had his head phones over his ears, while talking on the phone with the friend.  Per the patient's mother, he moved in with them from another state, while in [place], he hasn't followed up with his outpatient mental health treatment. Patient start using drugs, to self-medicate for the voices he was hearing. However, the mother and father stop giving him money and he currently have no money to purchase any drugs, and now he is unable to manage the voices. The past several days, he has had loud arguments with his self. Last night, he started hitting and slapping himself, while talking in third person. Mother voice her concern about the patient's current mental state and him harming himself with out it being intentional but because of his lack of awareness and psychosis.  CCA Screening, Triage and Referral (STR)  Patient Reported Information How did you hear about [place]? Family/Friend  What Is the Reason for Your Visit/Call Today? Placed under IVC by his mother due to increase symptoms of psychosis.  How Long Has This Been Causing You Problems? 1 wk - 1 month  What Do You Feel Would Help You the Most Today? Treatment for Depression or other mood problem; Alcohol or Drug Use Treatment   Have You Recently Had Any Thoughts About Hurting Yourself? No  Are You Planning to Commit Suicide/Harm Yourself At This time? No   Have you Recently Had Thoughts  About Hurting Someone Michael Chandler? No  Are You Planning to Harm Someone at This Time? No  Explanation: No data recorded  Have You Used Any Alcohol or Drugs in the Past 24 Hours? Yes  How Long Ago Did You Use Drugs or Alcohol? No data recorded What Did You Use and How Much? Methamphetamine, per patient's mother   Do You Currently Have a Therapist/Psychiatrist? No  Name of Therapist/Psychiatrist: No data recorded  Have You Been Recently Discharged From Any Office Practice or Programs? No  Explanation of Discharge From Practice/Program: No data recorded    CCA Screening Triage Referral Assessment Type of Contact: Face-to-Face  Telemedicine Service Delivery:   Is this Initial or Reassessment? No data recorded Date Telepsych consult ordered in [facility]:  No data recorded Time Telepsych consult ordered in [facility]L:  No data recorded Location of Assessment: [facility] ED  Provider Location: [facility] ED   Collateral Involvement: Patient's mother   Does Patient Have a Court Appointed Legal Guardian? No data recorded Name and Contact of Legal Guardian: No data recorded If Minor and Not Living with Parent(s), Who has Custody? No data recorded Is CPS involved or ever been involved? Never  Is APS involved or ever been involved? Never   Patient Determined To Be At Risk for Harm To Self or Others Based on Review of Patient Reported Information or Presenting Complaint? Yes, for Self-Harm  Method: No data recorded Availability of Means: No data recorded Intent: No data recorded Notification Required: No data recorded  Additional Information for Danger to Others Potential: No data recorded Additional Comments for Danger to Others Potential: No data recorded Are There Guns or Other Weapons in Your Home? No data recorded Types of Guns/Weapons: No data recorded Are These Weapons Safely Secured?                            No data recorded Who Could Verify You Are Able To Have These Secured: No data recorded Do  You Have any Outstanding Charges, Pending Court Dates, Parole/Probation? No data recorded Contacted To Inform of Risk of Harm To Self or Others: No data recorded   Does Patient Present under Involuntary Commitment? Yes  IVC Papers Initial File Date: 12/16/21   [place] of Residence: [place]   Patient Currently Receiving the Following Services: Not Receiving Services   Determination of Need: Emergent (2 hours)   Options For Referral: ED Visit     CCA Biopsychosocial Patient Reported Schizophrenia/Schizoaffective Diagnosis in Past: Yes   Strengths: Have a support system, able to communicate needs and have stable housing.   Mental Health Symptoms Depression:   Change in energy/activity; Worthlessness; Increase/decrease in appetite   Duration of Depressive symptoms:  Duration of Depressive Symptoms: Greater than two weeks   Mania:   Change in energy/activity; Irritability; Increased Energy; Racing thoughts; Recklessness   Anxiety:    Sleep; Tension; Worrying; Restlessness; Irritability   Psychosis:   Hallucinations; Other negative symptoms   Duration of Psychotic symptoms:  Duration of Psychotic Symptoms: Greater than six months   Trauma:   N/A   Obsessions:   N/A   Compulsions:   N/A   Inattention:   N/A   Hyperactivity/Impulsivity:   N/A   Oppositional/Defiant Behaviors:   N/A   Emotional Irregularity:   N/A   Other Mood/Personality Symptoms:  No data recorded   Mental Status Exam Appearance and self-care  Stature:   Average   Weight:   Average weight   Clothing:   Age-appropriate; Casual   Grooming:   Normal   Cosmetic use:   None   Posture/gait:   Normal   Motor activity:   -- (within normal range)   Sensorium  Attention:   Normal   Concentration:   Focuses on irrelevancies; Scattered   Orientation:   X5   Recall/memory:   Normal   Affect and Mood  Affect:   Appropriate   Mood:   Anxious   Relating  Eye  contact:   Normal   Facial expression:   Anxious   Attitude toward examiner:   Cooperative   Thought and Language  Speech flow:  Clear and Coherent   Thought content:   Appropriate to Mood and Circumstances   Preoccupation:   Ruminations   Hallucinations:   None (Per mother, he's having A/H.)   Organization:  No data recorded  [employer] of Knowledge:   Fair   Intelligence:   Average   Abstraction:   Abstract   Judgement:   Impaired   Reality Testing:   Distorted   Insight:   Poor   Decision Making:   Impulsive   Social Functioning  Social Maturity:   Impulsive; Isolates   Social Judgement:   "Chief of Staff"; Heedless   Stress  Stressors:   Other (Comment)   Coping Ability:   Exhausted; Overwhelmed   Skill Deficits:   Responsibility; Self-care   Supports:   Family     Religion: Religion/Spirituality  Are You A Religious Person?: No  Leisure/Recreation: Leisure / Recreation Do You Have Hobbies?: No  Exercise/Diet: Exercise/Diet Do You Exercise?: No Have You Gained or Lost A Significant Amount of Weight in the Past Six Months?: No Do You Follow a Special Diet?: No Do You Have Any Trouble Sleeping?: No   CCA Employment/Education Employment/Work Situation: Employment / Work Systems developer: On disability Why is Patient on Disability: Medical Patient's Job has Been Impacted by Current Illness: No Has Patient ever Been in the [employer]?: No  Education: Education Is Patient Currently Attending School?: No Did You Have An Individualized Education Program (IIEP): No Did You Have Any Difficulty At [employer]?: No Patient's Education Has Been Impacted by Current Illness: No   CCA Family/Childhood History Family and Relationship History: Family history Marital status: Single Does patient have children?: No  Childhood History:  Childhood History By whom was/is the patient raised?: Both parents Did  patient suffer any verbal/emotional/physical/sexual abuse as a child?: No Did patient suffer from severe childhood neglect?: No Has patient ever been sexually abused/assaulted/raped as an adolescent or adult?: No Was the patient ever a victim of a crime or a disaster?: No Witnessed domestic violence?: No Has patient been affected by domestic violence as an adult?: No  Child/Adolescent Assessment:    CCA Substance Use Alcohol/Drug Use: Alcohol / Drug Use Pain Medications: See PTA Prescriptions: See PTA Over the Counter: See PTA History of alcohol / drug use?: Yes Longest period of sobriety (when/how long): Unable to quantify Substance #1 Name of Substance 1: Methamphetamine, per patient's mother 1 - Frequency: Unable to quantify     ASAM's:  Six Dimensions of Multidimensional Assessment  Dimension 1:  Acute Intoxication and/or Withdrawal Potential:      Dimension 2:  Biomedical Conditions and Complications:      Dimension 3:  Emotional, Behavioral, or Cognitive Conditions and Complications:     Dimension 4:  Readiness to Change:     Dimension 5:  Relapse, Continued use, or Continued Problem Potential:     Dimension 6:  Recovery/Living Environment:     ASAM Severity Score:    ASAM Recommended Level of Treatment:     Substance use Disorder (SUD)    Recommendations for Services/Supports/Treatments:    Discharge Disposition:    DSM5 Diagnoses: Patient Active Problem List   Diagnosis Date Noted   Schizophrenia, 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/16/2021   Methamphetamine abuse (HCC) 12/16/2021     Referrals to Alternative Service(s): Referred to Alternative Service(s):   Place:   Date:   Time:    Referred to Alternative Service(s):   Place:   Date:   Time:    Referred to Alternative Service(s):   Place:   Date:   Time:    Referred to Alternative Service(s):   Place:   Date:   Time:     Lilyan Gilford MS, LCAS, [facility], [facility] Therapeutic Triage Specialist 12/16/2021 6:44  PM

## 2021-12-16 NOTE — ED Provider Notes (Signed)
[facility] Provider Note   Event Date/Time   First MD Initiated Contact with Patient 12/16/21 1609     (approximate) History  IVC  HPI Michael Chandler is a 36 y.o. male with a past medical history of bipolar disorder/schizophrenia who presents under involuntary commitment by his mother after he allegedly has been having auditory and visual hallucinations that have worsened since [place].  IVC states mother has noticed patient responding to internal stimuli as well as making suicidal statements with a plan to cut himself.  Patient does have access to blades.  Patient currently denies any homicidal ideation. ROS: Patient currently denies any vision changes, tinnitus, difficulty speaking, facial droop, sore throat, chest pain, shortness of breath, abdominal pain, nausea/vomiting/diarrhea, dysuria, or weakness/numbness/paresthesias in any extremity   Physical Exam  Triage Vital Signs: ED Triage Vitals  Enc Vitals Group     BP 12/16/21 1521 123/78     Pulse Rate 12/16/21 1521 98     Resp 12/16/21 1521 16     Temp 12/16/21 1521 97.8 F (36.6 C)     Temp Source 12/16/21 1521 Oral     SpO2 12/16/21 1521 100 %     Weight 12/16/21 1522 200 lb (90.7 kg)     Height 12/16/21 1522 6\' 5"  (1.956 m)     Head Circumference --      Peak Flow --      Pain Score 12/16/21 1529 0     Pain Loc --      Pain Edu? --      Excl. in GC? --    Most recent vital signs: Vitals:   12/16/21 1521  BP: 123/78  Pulse: 98  Resp: 16  Temp: 97.8 F (36.6 C)  SpO2: 100%   General: Awake, oriented x4. CV:  Good peripheral perfusion.  Resp:  Normal effort.  Abd:  No distention.  Other:  Middle-aged African-American male laying in bed in no acute distress.  Responding to internal stimuli ED Results / Procedures / Treatments  Labs (all labs ordered are listed, but only abnormal results are displayed) Labs Reviewed  COMPREHENSIVE METABOLIC PANEL  - Abnormal; Notable for the following components:      Result Value   Calcium 8.8 (*)    Alkaline Phosphatase 129 (*)    All other components within normal limits  SALICYLATE LEVEL - Abnormal; Notable for the following components:   Salicylate Lvl <7.0 (*)    All other components within normal limits  ACETAMINOPHEN LEVEL - Abnormal; Notable for the following components:   Acetaminophen (Tylenol), Serum <10 (*)    All other components within normal limits  CBC - Abnormal; Notable for the following components:   RBC 4.16 (*)    Hemoglobin 11.2 (*)    HCT 35.0 (*)    All other components within normal limits  URINE DRUG SCREEN, QUALITATIVE ([facility] ONLY) - Abnormal; Notable for the following components:   Amphetamines, Ur Screen POSITIVE (*)    All other components within normal limits  RESP PANEL BY RT-PCR (FLU A&B, COVID) ARPGX2  ETHANOL   PROCEDURES: Critical Care performed: No Procedures MEDICATIONS ORDERED IN ED: Medications  haloperidol (HALDOL) tablet 5 mg (has no administration in time range)  benztropine (COGENTIN) tablet 1 mg (1 mg Oral Given 12/16/21 1746)  traZODone (DESYREL) tablet 50 mg (has no administration in time range)  acetaminophen (TYLENOL) tablet 1,000 mg (1,000 mg Oral Given 12/16/21 1843)  IMPRESSION / MDM / ASSESSMENT AND PLAN / ED COURSE  I reviewed the triage vital signs and the nursing notes.                             The patient is on the cardiac monitor to evaluate for evidence of arrhythmia and/or significant heart rate changes. Patient's presentation is most consistent with acute presentation with potential threat to life or bodily function. Patient presents under IVC for hallucinations/delusions. Thoughts are disorganized. No history of prior suicide attempt, and no SI or HI at this time. Clinically w/ no overt toxidrome, low suspicion for ingestion given hx and exam Thoughts unlikely 2/2 anemia, hypothyroidism, infection, or ICH. Patients  decision making capacity is compromised and they are unable to perform all ADLs (additionally they are without appropriate caretakers to assist through this deficit).  Consult: Psychiatry to evaluate patient for grave disability Disposition: Pending psychiatric placement   FINAL CLINICAL IMPRESSION(S) / ED DIAGNOSES   Final diagnoses:  Acute psychosis (HCC)   Rx / DC Orders   ED Discharge Orders     None      Note:  This document was prepared using Dragon voice recognition software and may include unintentional dictation errors.   Merwyn Katos, MD 12/16/21 2045

## 2021-12-16 NOTE — ED Notes (Signed)
pt recieved snack and drink 

## 2021-12-16 NOTE — ED Notes (Signed)
IVC pending in patient per NP 

## 2021-12-17 ENCOUNTER — Inpatient Hospital Stay
Admission: AD | Admit: 2021-12-17 | Discharge: 2021-12-21 | DRG: 885 | Disposition: A | Discharge status: Home / Self Care | Payer: Medicare Other | Source: Intra-hospital | Attending: Psychiatry | Admitting: Psychiatry

## 2021-12-17 ENCOUNTER — Other Ambulatory Visit: Payer: Self-pay

## 2021-12-17 ENCOUNTER — Encounter: Payer: Self-pay | Admitting: Psychiatry

## 2021-12-17 DIAGNOSIS — R45851 Suicidal ideations: Secondary | ICD-10-CM | POA: Diagnosis present

## 2021-12-17 DIAGNOSIS — F17200 Nicotine dependence, unspecified, uncomplicated: Secondary | ICD-10-CM | POA: Diagnosis present

## 2021-12-17 DIAGNOSIS — F203 Undifferentiated schizophrenia: Secondary | ICD-10-CM | POA: Diagnosis not present

## 2021-12-17 DIAGNOSIS — F209 Schizophrenia, unspecified: Principal | ICD-10-CM | POA: Diagnosis present

## 2021-12-17 DIAGNOSIS — B20 Human immunodeficiency virus [HIV] disease: Secondary | ICD-10-CM | POA: Diagnosis present

## 2021-12-17 DIAGNOSIS — F200 Paranoid schizophrenia: Principal | ICD-10-CM | POA: Diagnosis present

## 2021-12-17 DIAGNOSIS — F411 Generalized anxiety disorder: Secondary | ICD-10-CM | POA: Diagnosis present

## 2021-12-17 DIAGNOSIS — F1510 Other stimulant abuse, uncomplicated: Secondary | ICD-10-CM | POA: Diagnosis present

## 2021-12-17 DIAGNOSIS — Z20822 Contact with and (suspected) exposure to covid-19: Secondary | ICD-10-CM | POA: Diagnosis present

## 2021-12-17 DIAGNOSIS — F29 Unspecified psychosis not due to a substance or known physiological condition: Secondary | ICD-10-CM | POA: Diagnosis present

## 2021-12-17 MED ORDER — ACETAMINOPHEN 325 MG PO TABS
650.0000 mg | ORAL_TABLET | Freq: Four times a day (QID) | ORAL | Status: DC | PRN
  Administered 2021-12-18 – 2021-12-21 (×3): 650 mg via ORAL
  Filled 2021-12-17 (×4): qty 2

## 2021-12-17 MED ORDER — HALOPERIDOL 5 MG PO TABS
5.0000 mg | ORAL_TABLET | Freq: Two times a day (BID) | ORAL | Status: DC
  Administered 2021-12-17 – 2021-12-21 (×8): 5 mg via ORAL
  Filled 2021-12-17 (×8): qty 1

## 2021-12-17 MED ORDER — GABAPENTIN 300 MG PO CAPS
300.0000 mg | ORAL_CAPSULE | Freq: Three times a day (TID) | ORAL | Status: DC
  Administered 2021-12-17 – 2021-12-21 (×12): 300 mg via ORAL
  Filled 2021-12-17 (×12): qty 1

## 2021-12-17 MED ORDER — MAGNESIUM HYDROXIDE 400 MG/5ML PO SUSP: 30.0000 mL | Freq: Every day | ORAL | Status: DC | PRN

## 2021-12-17 MED ORDER — GABAPENTIN 300 MG PO CAPS
300.0000 mg | ORAL_CAPSULE | Freq: Three times a day (TID) | ORAL | Status: DC
  Administered 2021-12-17: 300 mg via ORAL
  Filled 2021-12-17: qty 1

## 2021-12-17 MED ORDER — TRAZODONE HCL 50 MG PO TABS
50.0000 mg | ORAL_TABLET | Freq: Every evening | ORAL | Status: DC | PRN
  Administered 2021-12-19: 50 mg via ORAL
  Filled 2021-12-17: qty 1

## 2021-12-17 MED ORDER — ALUM & MAG HYDROXIDE-SIMETH 200-200-20 MG/5ML PO SUSP
30.0000 mL | ORAL | Status: DC | PRN
  Administered 2021-12-21: 30 mL via ORAL
  Filled 2021-12-17: qty 30

## 2021-12-17 MED ORDER — DICYCLOMINE HCL 10 MG PO CAPS
10.0000 mg | ORAL_CAPSULE | Freq: Once | ORAL | Status: AC
  Administered 2021-12-17: 10 mg via ORAL
  Filled 2021-12-17 (×2): qty 1

## 2021-12-17 MED ORDER — BENZTROPINE MESYLATE 1 MG PO TABS
1.0000 mg | ORAL_TABLET | Freq: Every day | ORAL | Status: DC
  Administered 2021-12-18 – 2021-12-21 (×4): 1 mg via ORAL
  Filled 2021-12-17 (×4): qty 1

## 2021-12-17 NOTE — ED Notes (Addendum)
Patient states that His stomach does feel better after taking the Bentyl  Nurse will continue to monitor.

## 2021-12-17 NOTE — Plan of Care (Signed)
  Problem: Education: Goal: Knowledge of General Education information will improve Description: Including pain rating scale, medication(s)/side effects and non-pharmacologic comfort measures Outcome: Not Progressing   Problem: Health Behavior/Discharge Planning: Goal: Ability to manage health-related needs will improve Outcome: Not Progressing   Problem: Clinical Measurements: Goal: Ability to maintain clinical measurements within normal limits will improve Outcome: Not Progressing Goal: Will remain free from infection Outcome: Not Progressing Goal: Diagnostic test results will improve Outcome: Not Progressing Goal: Respiratory complications will improve Outcome: Not Progressing Goal: Cardiovascular complication will be avoided Outcome: Not Progressing   Problem: Activity: Goal: Risk for activity intolerance will decrease Outcome: Not Progressing   Problem: Nutrition: Goal: Adequate nutrition will be maintained Outcome: Not Progressing   Problem: Coping: Goal: Level of anxiety will decrease Outcome: Not Progressing   Problem: Elimination: Goal: Will not experience complications related to bowel motility Outcome: Not Progressing Goal: Will not experience complications related to urinary retention Outcome: Not Progressing   Problem: Pain Managment: Goal: General experience of comfort will improve Outcome: Not Progressing   Problem: Safety: Goal: Ability to remain free from injury will improve Outcome: Not Progressing   Problem: Skin Integrity: Goal: Risk for impaired skin integrity will decrease Outcome: Not Progressing   Problem: Education: Goal: Knowledge of [facility] Education information/materials will improve Outcome: Not Progressing Goal: Emotional status will improve Outcome: Not Progressing Goal: Mental status will improve Outcome: Not Progressing Goal: Verbalization of understanding the information provided will improve Outcome: Not  Progressing   Problem: Activity: Goal: Interest or engagement in activities will improve Outcome: Not Progressing Goal: Sleeping patterns will improve Outcome: Not Progressing   Problem: Coping: Goal: Ability to verbalize frustrations and anger appropriately will improve Outcome: Not Progressing Goal: Ability to demonstrate self-control will improve Outcome: Not Progressing   Problem: Health Behavior/Discharge Planning: Goal: Identification of resources available to assist in meeting health care needs will improve Outcome: Not Progressing Goal: Compliance with treatment plan for underlying cause of condition will improve Outcome: Not Progressing   Problem: Physical Regulation: Goal: Ability to maintain clinical measurements within normal limits will improve Outcome: Not Progressing   Problem: Safety: Goal: Periods of time without injury will increase Outcome: Not Progressing   Problem: Activity: Goal: Will verbalize the importance of balancing activity with adequate rest periods Outcome: Not Progressing   Problem: Education: Goal: Will be free of psychotic symptoms Outcome: Not Progressing Goal: Knowledge of the prescribed therapeutic regimen will improve Outcome: Not Progressing   Problem: Coping: Goal: Coping ability will improve Outcome: Not Progressing Goal: Will verbalize feelings Outcome: Not Progressing   Problem: Health Behavior/Discharge Planning: Goal: Compliance with prescribed medication regimen will improve Outcome: Not Progressing   Problem: Nutritional: Goal: Ability to achieve adequate nutritional intake will improve Outcome: Not Progressing   Problem: Role Relationship: Goal: Ability to communicate needs accurately will improve Outcome: Not Progressing Goal: Ability to interact with others will improve Outcome: Not Progressing   Problem: Safety: Goal: Ability to redirect hostility and anger into socially appropriate behaviors will  improve Outcome: Not Progressing Goal: Ability to remain free from injury will improve Outcome: Not Progressing   Problem: Self-Care: Goal: Ability to participate in self-care as condition permits will improve Outcome: Not Progressing   Problem: Self-Concept: Goal: Will verbalize positive feelings about self Outcome: Not Progressing   

## 2021-12-17 NOTE — ED Notes (Signed)
Pt. Transferred to [facility] from ED to room 8 after screening for contraband. Report to include Situation, Background, Assessment and Recommendations from Dawn RN. Pt. Oriented to unit including Q15 minute rounds as well as the security cameras for their protection. Patient is alert and oriented, warm and dry in no acute distress. Patient denies SI, HI, and AVH. Pt. Encouraged to let me know if needs arise.  

## 2021-12-17 NOTE — ED Provider Notes (Signed)
Emergency Medicine Observation Re-evaluation Note  Michael Chandler is a 36 y.o. male, seen on rounds today.  Pt initially presented to the ED for complaints of IVC Currently, the patient is resting in his room and has no acute complaints.  Physical Exam  BP 102/63   Pulse 72   Temp 97.9 F (36.6 C) (Oral)   Resp 16   Ht 6\' 5"  (1.956 m)   Wt 90.7 kg   SpO2 100%   BMI 23.72 kg/m  Physical Exam General: No distress Cardiac: Well perfused Lungs: Normal respiratory effort Psych: Calm and cooperative  ED Course / MDM   I have reviewed the labs performed to date as well as medications administered while in observation.  There have been no significant changes to his status since his initial evaluation.  Plan  Current plan is for psychiatric admission.  Michael Chandler is not under involuntary commitment.     Katrinka Blazing, MD 12/17/21 1536

## 2021-12-17 NOTE — Tx Team (Signed)
Initial Treatment Plan 12/17/2021 4:35 PM Durwin Hazelrigg DJT:[id number]    PATIENT STRESSORS: Marital or family conflict   Medication change or noncompliance     PATIENT STRENGTHS: Communication skills  General fund of knowledge  Supportive family/friends    PATIENT IDENTIFIED PROBLEMS: Family conflict  Per report: medication noncompliance  Per report/chart review: bizarre behavior/psychosis                 DISCHARGE CRITERIA:  Ability to meet basic life and health needs Improved stabilization in mood, thinking, and/or behavior Medical problems require only outpatient monitoring Need for constant or close observation no longer present Reduction of life-threatening or endangering symptoms to within safe limits  PRELIMINARY DISCHARGE PLAN: Outpatient therapy Return to previous living arrangement  PATIENT/FAMILY INVOLVEMENT: This treatment plan has been presented to and reviewed with the patient, Calixto Pavel. The patient has been given the opportunity to ask questions and make suggestions.  Natayla Cadenhead, RN 12/17/2021, 4:35 PM

## 2021-12-17 NOTE — Progress Notes (Signed)
Admission Note:   Report was received from [place], [place] on a 36 year-old male who presents IVC in no acute distress for the treatment of Psychosis and medication noncompliance. Patient was calm and cooperative with admission process. Patient states that he is here because "my mother has a problem with me. She told people that I wanted to commit suicide. This is all a misunderstanding". Patient reports that he doesn't have any support outside of his parents and "they really don't support me". Patient stated that he is on disability and he is "getting ready to get my apartment". Patient denies SI/HI/AVH and pain at this time. Patient also denies any signs/symptoms of depression/anxiety at this time. Patient denies having any stressors. Patient's goals for treatment are "just to get out of here". Patient has a past medical history of Schizophrenia and HIV. Skin was assessed with Ivonne Andrew, RN and found to be clear of any abnormal marks apart from a healed surgical scar going down the back of his neck, a healed scar to the right side of his back and to his left forearm from a previous dog attack, per patient. Patient searched and no contraband found and unit policies explained and understanding verbalized. Consents obtained. Food and fluids offered, and both accepted. Patient had no additional questions or concerns to voice at this time. Patient remains safe on the unit.

## 2021-12-17 NOTE — ED Notes (Signed)
IVC pending placement 

## 2021-12-17 NOTE — ED Notes (Signed)
Patient ate 100% of lunch and beverage, no signs of distress, will continue to monitor. No behavioral issues, camera surveillance in progress for safety and q 15 minute checks.

## 2021-12-17 NOTE — ED Notes (Signed)
Patient took morning medications, no signs of distress, will continue to monitor, he ask for extra tray, gave him a sandwich tray, He denies Si/hi , camera surveillance in progress for safety.

## 2021-12-17 NOTE — BH Assessment (Signed)
Referral information for Psychiatric Hospitalization faxed to;  [facility] ([phone]-or- [phone]),   [facility] ([phone]),  [facility] ([phone]),   [facility] ([phone] -or- [phone]),   [facility] ([phone])  [facility] ([phone])  

## 2021-12-17 NOTE — Consult Note (Signed)
[facility] Face-to-Face Psychiatry Consult   Reason for Consult:  psychosis  Referring Physician:  EDP Patient Identification: Michael Chandler MRN:  [id number] Principal Diagnosis: Schizophrenia, undifferentiated (HCC) Diagnosis:  Principal Problem:   Schizophrenia, undifferentiated (HCC) Active Problems:   Methamphetamine abuse (HCC)   Total Time spent with patient: 45 minutes  Subjective:   Michael Chandler is a 36 y.o. male patient admitted with psychosis.  Client continues to minimize his mental health concerns and wants to discharge to pay his rent at his apartment at the beginning of the month.  Politely let him know he should be admitted and treated with release by the time his rent is due.  Denies any issues besides his desire to discharge.  He is slightly irritable at staying, not as lively and animated as yesterday.  It also could be withdrawal from meth, gabapentin started to assist his withdrawal.  HPI on 8/25:  36 yo male who presented under IVC with hallucinations, off his medications.  Per his mother and notes, he typically use methamphetamine which make his symptoms worse.  However, he has not in the past few days and he has been having arguments with his hallucinations with some suicidal statements at home.  He reports not having schizophrenia but PTSD and OCD.  Denies taking medications and substance use.  He explains his behaviors as he was talking on the phone, not to visual hallucinations.  He is hyperverbal on assessment and difficult to verbally redirect.  His mother reports his last psychiatrist wanted him to be on Haldol.  He will need admission with medications started and possibly a long-term antipsychotic like Haldol dec.  Past Psychiatric History: schizophrenia, OCD, PTSD  Risk to Self:  yes Risk to Others:  none Prior Inpatient Therapy:  several Prior Outpatient Therapy:  none currently  Past Medical History:  Past Medical History:  Diagnosis Date   HIV (human  immunodeficiency virus infection) (HCC)    Schizophrenia (HCC)    per IVC paperwork, pt states does not have this dx   History reviewed. No pertinent surgical history. Family History: History reviewed. No pertinent family history. Family Psychiatric  History: none Social History:  Social History   Substance and Sexual Activity  Alcohol Use Yes   Comment: occasional     Social History   Substance and Sexual Activity  Drug Use Yes   Types: Methamphetamines, Marijuana, Cocaine   Comment: pt denies at this time    Social History   Socioeconomic History   Marital status: Single    Spouse name: Not on file   Number of children: Not on file   Years of education: Not on file   Highest education level: Not on file  Occupational History   Not on file  Tobacco Use   Smoking status: Every Day   Smokeless tobacco: Never  Substance and Sexual Activity   Alcohol use: Yes    Comment: occasional   Drug use: Yes    Types: Methamphetamines, Marijuana, Cocaine    Comment: pt denies at this time   Sexual activity: Not on file  Other Topics Concern   Not on file  Social History Narrative   Not on file   Social Determinants of Health   Financial Resource Strain: Not on file  Food Insecurity: Not on file  Transportation Needs: Not on file  Physical Activity: Not on file  Stress: Not on file  Social Connections: Not on file   Additional Social History:    Allergies:  Allergies  Allergen Reactions   Bactrim [Sulfamethoxazole-Trimethoprim] Anaphylaxis    Labs:  Results for orders placed or performed during the hospital encounter of 12/16/21 (from the past 48 hour(s))  Comprehensive metabolic panel     Status: Abnormal   Collection Time: 12/16/21  3:36 PM  Result Value Ref Range   Sodium 136 135 - 145 mmol/L   Potassium 4.0 3.5 - 5.1 mmol/L   Chloride 103 98 - 111 mmol/L   CO2 28 22 - 32 mmol/L   Glucose, Bld 87 70 - 99 mg/dL    Comment: Glucose reference range applies  only to samples taken after fasting for at least 8 hours.   BUN 14 6 - 20 mg/dL   Creatinine, Ser 8.18 0.61 - 1.24 mg/dL   Calcium 8.8 (L) 8.9 - 10.3 mg/dL   Total Protein 7.9 6.5 - 8.1 g/dL   Albumin 3.6 3.5 - 5.0 g/dL   AST 28 15 - 41 U/L   ALT 21 0 - 44 U/L   Alkaline Phosphatase 129 (H) 38 - 126 U/L   Total Bilirubin 0.6 0.3 - 1.2 mg/dL   GFR, Estimated >56 >31 mL/min    Comment: (NOTE) Calculated using the CKD-EPI Creatinine Equation (2021)    Anion gap 5 5 - 15    Comment: Performed at [facility], [address] [place], [place] [address]  Ethanol     Status: None   Collection Time: 12/16/21  3:36 PM  Result Value Ref Range   Alcohol, Ethyl (B) <10 <10 mg/dL    Comment: (NOTE) Lowest detectable limit for serum alcohol is 10 mg/dL.  For medical purposes only. Performed at [facility], [address] [place], [place] [address]   Salicylate level     Status: Abnormal   Collection Time: 12/16/21  3:36 PM  Result Value Ref Range   Salicylate Lvl <7.0 (L) 7.0 - 30.0 mg/dL    Comment: Performed at [facility], [address] [place], [place] [address]  Acetaminophen level     Status: Abnormal   Collection Time: 12/16/21  3:36 PM  Result Value Ref Range   Acetaminophen (Tylenol), Serum <10 (L) 10 - 30 ug/mL    Comment: (NOTE) Therapeutic concentrations vary significantly. A range of 10-30 ug/mL  may be an effective concentration for many patients. However, some  are best treated at concentrations outside of this range. Acetaminophen concentrations >150 ug/mL at 4 hours after ingestion  and >50 ug/mL at 12 hours after ingestion are often associated with  toxic reactions.  Performed at [facility], [address] [place], [place] [address]   cbc     Status: Abnormal   Collection Time: 12/16/21  3:36 PM  Result Value Ref Range   WBC 4.5 4.0 - 10.5 K/uL   RBC 4.16 (L) 4.22 - 5.81 MIL/uL   Hemoglobin 11.2 (L) 13.0 - 17.0  g/dL   HCT 87.8 (L) 67.6 - 72.0 %   MCV 84.1 80.0 - 100.0 fL   MCH 26.9 26.0 - 34.0 pg   MCHC 32.0 30.0 - 36.0 g/dL   RDW 94.7 09.6 - 28.3 %   Platelets 258 150 - 400 K/uL   nRBC 0.0 0.0 - 0.2 %    Comment: Performed at [facility], [address] [place], [place] [address]  Urine Drug Screen, Qualitative     Status: Abnormal   Collection Time: 12/16/21  3:36 PM  Result Value Ref Range  Tricyclic, Ur Screen NONE DETECTED NONE DETECTED   Amphetamines, Ur Screen POSITIVE (A) NONE DETECTED   MDMA (Ecstasy)Ur Screen NONE DETECTED NONE DETECTED   Cocaine Metabolite,Ur [place] NONE DETECTED NONE DETECTED   Opiate, Ur Screen NONE DETECTED NONE DETECTED   Phencyclidine (PCP) Ur S NONE DETECTED NONE DETECTED   Cannabinoid 50 Ng, Ur [place] NONE DETECTED NONE DETECTED   Barbiturates, Ur Screen NONE DETECTED NONE DETECTED   Benzodiazepine, Ur Scrn NONE DETECTED NONE DETECTED   Methadone Scn, Ur NONE DETECTED NONE DETECTED    Comment: (NOTE) Tricyclics + metabolites, urine    Cutoff 1000 ng/mL Amphetamines + metabolites, urine  Cutoff 1000 ng/mL MDMA (Ecstasy), urine              Cutoff 500 ng/mL Cocaine Metabolite, urine          Cutoff 300 ng/mL Opiate + metabolites, urine        Cutoff 300 ng/mL Phencyclidine (PCP), urine         Cutoff 25 ng/mL Cannabinoid, urine                 Cutoff 50 ng/mL Barbiturates + metabolites, urine  Cutoff 200 ng/mL Benzodiazepine, urine              Cutoff 200 ng/mL Methadone, urine                   Cutoff 300 ng/mL  The urine drug screen provides only a preliminary, unconfirmed analytical test result and should not be used for non-medical purposes. Clinical consideration and professional judgment should be applied to any positive drug screen result due to possible interfering substances. A more specific alternate chemical method must be used in order to obtain a confirmed analytical result. Gas chromatography / mass spectrometry (GC/MS) is the  preferred confirm atory method. Performed at [facility], [address] [place], [place] [address]   Resp Panel by RT-PCR (Flu A&B, Covid) Anterior Nasal Swab     Status: None   Collection Time: 12/16/21  5:36 PM   Specimen: Anterior Nasal Swab  Result Value Ref Range   SARS Coronavirus 2 by RT PCR NEGATIVE NEGATIVE    Comment: (NOTE) SARS-CoV-2 target nucleic acids are NOT DETECTED.  The SARS-CoV-2 RNA is generally detectable in upper respiratory specimens during the acute phase of infection. The lowest concentration of SARS-CoV-2 viral copies this assay can detect is 138 copies/mL. A negative result does not preclude SARS-Cov-2 infection and should not be used as the sole basis for treatment or other patient management decisions. A negative result may occur with  improper specimen collection/handling, submission of specimen other than nasopharyngeal swab, presence of viral mutation(s) within the areas targeted by this assay, and inadequate number of viral copies(<138 copies/mL). A negative result must be combined with clinical observations, patient history, and epidemiological information. The expected result is Negative.  Fact Sheet for Patients:  EntrepreneurPulse.com.au  Fact Sheet for Healthcare Providers:  IncredibleEmployment.be  This test is no t yet approved or cleared by the [place] FDA and  has been authorized for detection and/or diagnosis of SARS-CoV-2 by FDA under an Emergency Use Authorization (EUA). This EUA will remain  in effect (meaning this test can be used) for the duration of the COVID-19 declaration under Section 564(b)(1) of the Act, 21 U.S.C.section 360bbb-3(b)(1), unless the authorization is terminated  or revoked sooner.       Influenza A by PCR NEGATIVE NEGATIVE   Influenza B by  PCR NEGATIVE NEGATIVE    Comment: (NOTE) The Xpert Xpress SARS-CoV-2/FLU/RSV plus assay is intended as an aid in  the diagnosis of influenza from Nasopharyngeal swab specimens and should not be used as a sole basis for treatment. Nasal washings and aspirates are unacceptable for Xpert Xpress SARS-CoV-2/FLU/RSV testing.  Fact Sheet for Patients: EntrepreneurPulse.com.au  Fact Sheet for Healthcare Providers: IncredibleEmployment.be  This test is not yet approved or cleared by the [place] FDA and has been authorized for detection and/or diagnosis of SARS-CoV-2 by FDA under an Emergency Use Authorization (EUA). This EUA will remain in effect (meaning this test can be used) for the duration of the COVID-19 declaration under Section 564(b)(1) of the Act, 21 U.S.C. section 360bbb-3(b)(1), unless the authorization is terminated or revoked.  Performed at [facility], [address] [place], [place] [address]     Current Facility-Administered Medications  Medication Dose Route Frequency Provider Last Rate Last Admin   benztropine (COGENTIN) tablet 1 mg  1 mg Oral Daily Patrecia Pour, NP   1 mg at 12/16/21 1746   haloperidol (HALDOL) tablet 5 mg  5 mg Oral BID Patrecia Pour, NP   5 mg at 12/16/21 2129   traZODone (DESYREL) tablet 50 mg  50 mg Oral QHS PRN Patrecia Pour, NP   50 mg at 12/16/21 2129   Current Outpatient Medications  Medication Sig Dispense Refill   famotidine (PEPCID) 20 MG tablet Take 1 tablet (20 mg total) by mouth 2 (two) times daily for 15 days. (Patient not taking: Reported on 12/16/2021) 30 tablet 0   ondansetron (ZOFRAN-ODT) 8 MG disintegrating tablet Take 1 tablet (8 mg total) by mouth every 8 (eight) hours as needed for nausea or vomiting. (Patient not taking: Reported on 12/16/2021) 12 tablet 0   polyethylene glycol (MIRALAX) 17 g packet Take 17 g by mouth daily. (Patient not taking: Reported on 12/16/2021) 14 each 0    Musculoskeletal: Strength & Muscle Tone: within normal limits Gait & Station: normal Patient leans:  N/A            Psychiatric Specialty Exam:  Presentation  General Appearance: Disheveled  Eye Contact:Minimal  Speech:Garbled  Speech Volume:Decreased  Handedness:No data recorded  Mood and Affect  Mood:-- (sleepy)  Affect:Blunt   Thought Process  Thought Processes:Coherent  Descriptions of Associations:Intact  Orientation:Full (Time, Place and Person)  Thought Content:WDL  History of Schizophrenia/Schizoaffective disorder:Yes  Duration of Psychotic Symptoms:Greater than six months Hallucinations:No data recorded Ideas of Reference:Delusions  Suicidal Thoughts:No data recorded Homicidal Thoughts:No data recorded  Sensorium  Memory:Immediate Poor  Judgment:Fair  Insight:Poor   Executive Functions  Concentration:Poor  Attention Span:Poor  Recall:Poor  Fund of Knowledge:Poor  Language:Poor   Psychomotor Activity  Psychomotor Activity:No data recorded  Assets  Assets:Financial Resources/Insurance; Resilience   Sleep  Sleep:No data recorded  Physical Exam: Physical Exam Vitals and nursing note reviewed.  Constitutional:      Appearance: Normal appearance.  HENT:     Head: Normocephalic.     Nose: Nose normal.  Pulmonary:     Effort: Pulmonary effort is normal.  Musculoskeletal:        General: Normal range of motion.     Cervical back: Normal range of motion.  Neurological:     General: No focal deficit present.     Mental Status: He is alert and oriented to person, place, and time.  Psychiatric:        Attention and Perception: He is inattentive. He  perceives auditory hallucinations.        Mood and Affect: Mood is anxious.        Speech: Speech normal.        Behavior: Behavior normal. Behavior is cooperative.        Thought Content: Thought content is delusional.        Cognition and Memory: Cognition and memory normal.        Judgment: Judgment is impulsive and inappropriate.    Review of Systems   Musculoskeletal:  Positive for back pain.  Psychiatric/Behavioral:  Positive for hallucinations and substance abuse. The patient is nervous/anxious.   All other systems reviewed and are negative.  Blood pressure 102/63, pulse 72, temperature 97.9 F (36.6 C), temperature source Oral, resp. rate 16, height 6\' 5"  (1.956 m), weight 90.7 kg, SpO2 100 %. Body mass index is 23.72 kg/m.  Treatment Plan Summary: Daily contact with patient to assess and evaluate symptoms and progress in treatment, Medication management, and Plan : Schizophrenia, undifferentiated: Haldol 5 mg BID  Methamphetamine use disorder: Started gabapentin 300 mg TID  EPS: Cogentin 1 mg daily  INsomnia: Trazodone 50  mg daily at bedtime PRN  Disposition: Recommend psychiatric Inpatient admission when medically cleared.  Waylan Boga, NP 12/17/2021 10:32 AM

## 2021-12-17 NOTE — BH Assessment (Signed)
Patient is to be admitted to [facility] today 12/17/21 after 2:30pm by Psychiatric Nurse Practitioner Nanine Means.  Attending Physician will be Dr.  Toni Amend .   Patient has been assigned to room 323, by [facility] Charge Nurse, Persais Ethridge.     ER staff is aware of the admission: Tracie, ER Secretary   Dr. Fuller Plan, ER MD  Toniann Fail, Patient's Nurse  Purnell Shoemaker, Patient Access.

## 2021-12-18 DIAGNOSIS — F200 Paranoid schizophrenia: Secondary | ICD-10-CM

## 2021-12-18 DIAGNOSIS — B20 Human immunodeficiency virus [HIV] disease: Secondary | ICD-10-CM

## 2021-12-18 LAB — LIPID PANEL
Cholesterol: 160 mg/dL (ref 0–200)
HDL: 30 mg/dL — ABNORMAL LOW (ref >40)
LDL Cholesterol: 50 mg/dL (ref 0–99)
Total CHOL/HDL Ratio: 5.3 RATIO
Triglycerides: 399 mg/dL — ABNORMAL HIGH (ref <150)
VLDL: 80 mg/dL — ABNORMAL HIGH (ref 0–40)

## 2021-12-18 LAB — HEMOGLOBIN A1C
Hgb A1c MFr Bld: 6.2 % — ABNORMAL HIGH (ref 4.8–5.6)
Mean Plasma Glucose: 131.24 mg/dL

## 2021-12-18 NOTE — B[facility] Group Notes (Signed)
[facility] Group Notes:  (Nursing/MHT/Case Management/Adjunct)  Date:  12/18/2021  Time:  9:44 PM  Type of Therapy:   Warp up  Participation Level:  Did Not Attend  Summary of Progress/Problems:  Michael Chandler 12/18/2021, 9:44 PM

## 2021-12-18 NOTE — BHH Suicide Risk Assessment (Signed)
[facility] INPATIENT:  Family/Significant Other Suicide Prevention Education  Suicide Prevention Education:  Contact Attempts: Dusan Lipford, mother, has been identified by the patient as the family member/significant other with whom the patient will be residing, and identified as the person(s) who will aid the patient in the event of a mental health crisis.  With written consent from the patient, two attempts were made to provide suicide prevention education, prior to and/or following the patient's discharge.  We were unsuccessful in providing suicide prevention education.  A suicide education pamphlet was given to the patient to share with family/significant other.  Date and time of first attempt:12/18/21, 36 Date and time of second attempt:  Lorri Frederick 12/18/2021, 3:49 PM
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
## 2021-12-18 NOTE — Plan of Care (Signed)
Patient was sleeping most of the shift.  15 minute safety checks performed.  No complaints of distress

## 2021-12-18 NOTE — H&P (Signed)
Psychiatric Admission Assessment Adult  Patient Identification: Michael Chandler MRN:  [id number] Date of Evaluation:  12/18/2021 Chief Complaint:  Schizophrenia, unspecified ([place]) [F20.9] Principal Diagnosis: Schizophrenia, unspecified ([place]) Diagnosis:  Principal Problem:   Schizophrenia, unspecified ([place]) Active Problems:   Methamphetamine abuse ([place])   HIV disease ([place])  History of Present Illness: Patient seen and chart reviewed.  36 year old man with a history of past mental health problems brought to the hospital under IVC filed by his mother.  IVC paperwork reports that the patient has been hallucinating and talking to voices and that she had heard him make statements about hurting himself or hurting others with knives.  Patient denies all of this.  He says that he was talking on his cell phone and not talking to hallucinations.  He claims that he had never made any statements at all about knives or hurting himself or hurting anyone else.  He denies suicidal or homicidal ideation.  He denies all symptoms stating his mood has been fine his sleep is fine and his appetite is fine and he has no delusions or hallucinations.  He claims that he is actively taking psychiatric medicine and named aripiprazole although it appears he has no local physician.  Paperwork in the IVC claims that he is not taking any medication for outpatient mental health treatment.  It also reports that he is abusing methamphetamines.  Patient denies this and denies all drug use but his drug screen is positive for amphetamines.  On interview today he appeared calm and lucid although he minimized or dismissed all symptoms.  Furthermore he does have a history of being HIV positive.  Patient claims that he is compliant with treatment but could not name a local provider and does not seem reliable about this information. Associated Signs/Symptoms: Depression Symptoms:  impaired memory, suicidal thoughts without plan, Duration of  Depression Symptoms: Greater than two weeks  (Hypo) Manic Symptoms:  Impulsivity, Labiality of Mood, Anxiety Symptoms:  Excessive Worry, Psychotic Symptoms:  Hallucinations: Auditory Paranoia, PTSD Symptoms: Negative Total Time spent with patient: 1 hour  Past Psychiatric History: Patient admits to having had psychiatric treatment in the past but denies ever being in the hospital.  Says he is always "gone voluntary" in the past.  I ask him whether he had even been admitted when he came voluntarily and he seemed confused by the question.  He denies any history of suicide attempts or violence.  He does name the medicine aripiprazole.  Unclear whether symptoms could have largely been related to substance abuse which seems to be implicated in a lot of his symptomatology and history.  Is the patient at risk to self? Yes.    Has the patient been a risk to self in the past 6 months? Yes.    Has the patient been a risk to self within the distant past? Yes.    Is the patient a risk to others? No.  Has the patient been a risk to others in the past 6 months? No.  Has the patient been a risk to others within the distant past? No.   [place] Scale:  [place] Admission (Current) from 12/17/2021 in [place] ED from 12/16/2021 in [place] ED from 12/03/2021 in [place] No Risk No Risk No Risk        Prior Inpatient Therapy:   Prior Outpatient Therapy:    Alcohol Screening: 1. How often do  you have a drink containing alcohol?: Never 2. How many drinks containing alcohol do you have on a typical day when you are drinking?: 1 or 2 3. How often do you have six or more drinks on one occasion?: Never AUDIT-C Score: 0 4. How often during the last year have you found that you were not able to stop drinking once you had started?: Never 5. How often during the last year  have you failed to do what was normally expected from you because of drinking?: Never 6. How often during the last year have you needed a first drink in the morning to get yourself going after a heavy drinking session?: Never 7. How often during the last year have you had a feeling of guilt of remorse after drinking?: Never 8. How often during the last year have you been unable to remember what happened the night before because you had been drinking?: Never 9. Have you or someone else been injured as a result of your drinking?: No 10. Has a relative or friend or a doctor or another health worker been concerned about your drinking or suggested you cut down?: No Alcohol Use Disorder Identification Test Final Score (AUDIT): 0 Substance Abuse History in the last 12 months:  Yes.   Consequences of Substance Abuse: It seems likely that substance abuse is actively involved in current symptom presentation Previous Psychotropic Medications: Yes  Psychological Evaluations: Yes  Past Medical History:  Past Medical History:  Diagnosis Date   HIV (human immunodeficiency virus infection) ([place])    Schizophrenia ([place])    per IVC paperwork, pt states does not have this dx   History reviewed. No pertinent surgical history. Family History: History reviewed. No pertinent family history. Family Psychiatric  History: None reported Tobacco Screening:   Social History:  Social History   Substance and Sexual Activity  Alcohol Use Yes   Comment: occasional     Social History   Substance and Sexual Activity  Drug Use Yes   Types: Methamphetamines, Marijuana, Cocaine   Comment: pt denies at this time    Additional Social History: Marital status: Single Are you sexually active?: No What is your sexual orientation?: bisexual Has your sexual activity been affected by drugs, alcohol, medication, or emotional stress?: na Does patient have children?: Yes How many children?: 5 How is patient's relationship  with their children?: pt reports he has children in [place] and in [place] who stay with their mothers: Christinan 13, Ahnyx 5, Hai 5, marleyna 8, randall 11 months                         Allergies:   Allergies  Allergen Reactions   Bactrim [Sulfamethoxazole-Trimethoprim] Anaphylaxis   Lab Results:  Results for orders placed or performed during the hospital encounter of 12/16/21 (from the past 48 hour(s))  Comprehensive metabolic panel     Status: Abnormal   Collection Time: 12/16/21  3:36 PM  Result Value Ref Range   Sodium 136 135 - 145 mmol/L   Potassium 4.0 3.5 - 5.1 mmol/L   Chloride 103 98 - 111 mmol/L   CO2 28 22 - 32 mmol/L   Glucose, Bld 87 70 - 99 mg/dL    Comment: Glucose reference range applies only to samples taken after fasting for at least 8 hours.   BUN 14 6 - 20 mg/dL   Creatinine, Ser 0.79 0.61 - 1.24 mg/dL   Calcium 8.8 (L) 8.9 - 10.3  mg/dL   Total Protein 7.9 6.5 - 8.1 g/dL   Albumin 3.6 3.5 - 5.0 g/dL   AST 28 15 - 41 U/L   ALT 21 0 - 44 U/L   Alkaline Phosphatase 129 (H) 38 - 126 U/L   Total Bilirubin 0.6 0.3 - 1.2 mg/dL   GFR, Estimated >41 >74 mL/min    Comment: (NOTE) Calculated using the CKD-EPI Creatinine Equation (2021)    Anion gap 5 5 - 15    Comment: Performed at [facility], [address] [place], [place] [address]  Ethanol     Status: None   Collection Time: 12/16/21  3:36 PM  Result Value Ref Range   Alcohol, Ethyl (B) <10 <10 mg/dL    Comment: (NOTE) Lowest detectable limit for serum alcohol is 10 mg/dL.  For medical purposes only. Performed at [facility], [address] [place], [place] [address]   Salicylate level     Status: Abnormal   Collection Time: 12/16/21  3:36 PM  Result Value Ref Range   Salicylate Lvl <7.0 (L) 7.0 - 30.0 mg/dL    Comment: Performed at [facility], [address] [place], [place] [address]  Acetaminophen level     Status: Abnormal   Collection Time:  12/16/21  3:36 PM  Result Value Ref Range   Acetaminophen (Tylenol), Serum <10 (L) 10 - 30 ug/mL    Comment: (NOTE) Therapeutic concentrations vary significantly. A range of 10-30 ug/mL  may be an effective concentration for many patients. However, some  are best treated at concentrations outside of this range. Acetaminophen concentrations >150 ug/mL at 4 hours after ingestion  and >50 ug/mL at 12 hours after ingestion are often associated with  toxic reactions.  Performed at [facility], [address] [place], [place] [address]   cbc     Status: Abnormal   Collection Time: 12/16/21  3:36 PM  Result Value Ref Range   WBC 4.5 4.0 - 10.5 K/uL   RBC 4.16 (L) 4.22 - 5.81 MIL/uL   Hemoglobin 11.2 (L) 13.0 - 17.0 g/dL   HCT 85.8 (L) 85.0 - 27.7 %   MCV 84.1 80.0 - 100.0 fL   MCH 26.9 26.0 - 34.0 pg   MCHC 32.0 30.0 - 36.0 g/dL   RDW 41.2 87.8 - 67.6 %   Platelets 258 150 - 400 K/uL   nRBC 0.0 0.0 - 0.2 %    Comment: Performed at [facility], [address] [place], [place] [address]  Urine Drug Screen, Qualitative     Status: Abnormal   Collection Time: 12/16/21  3:36 PM  Result Value Ref Range   Tricyclic, Ur Screen NONE DETECTED NONE DETECTED   Amphetamines, Ur Screen POSITIVE (A) NONE DETECTED   MDMA (Ecstasy)Ur Screen NONE DETECTED NONE DETECTED   Cocaine Metabolite,Ur [place] NONE DETECTED NONE DETECTED   Opiate, Ur Screen NONE DETECTED NONE DETECTED   Phencyclidine (PCP) Ur S NONE DETECTED NONE DETECTED   Cannabinoid 50 Ng, Ur [place] NONE DETECTED NONE DETECTED   Barbiturates, Ur Screen NONE DETECTED NONE DETECTED   Benzodiazepine, Ur Scrn NONE DETECTED NONE DETECTED   Methadone Scn, Ur NONE DETECTED NONE DETECTED    Comment: (NOTE) Tricyclics + metabolites, urine    Cutoff 1000 ng/mL Amphetamines + metabolites, urine  Cutoff 1000 ng/mL MDMA (Ecstasy), urine              Cutoff 500 ng/mL Cocaine Metabolite, urine  Cutoff 300 ng/mL Opiate +  metabolites, urine        Cutoff 300 ng/mL Phencyclidine (PCP), urine         Cutoff 25 ng/mL Cannabinoid, urine                 Cutoff 50 ng/mL Barbiturates + metabolites, urine  Cutoff 200 ng/mL Benzodiazepine, urine              Cutoff 200 ng/mL Methadone, urine                   Cutoff 300 ng/mL  The urine drug screen provides only a preliminary, unconfirmed analytical test result and should not be used for non-medical purposes. Clinical consideration and professional judgment should be applied to any positive drug screen result due to possible interfering substances. A more specific alternate chemical method must be used in order to obtain a confirmed analytical result. Gas chromatography / mass spectrometry (GC/MS) is the preferred confirm atory method. Performed at [facility], [address] [place], [place] [address]   Resp Panel by RT-PCR (Flu A&B, Covid) Anterior Nasal Swab     Status: None   Collection Time: 12/16/21  5:36 PM   Specimen: Anterior Nasal Swab  Result Value Ref Range   SARS Coronavirus 2 by RT PCR NEGATIVE NEGATIVE    Comment: (NOTE) SARS-CoV-2 target nucleic acids are NOT DETECTED.  The SARS-CoV-2 RNA is generally detectable in upper respiratory specimens during the acute phase of infection. The lowest concentration of SARS-CoV-2 viral copies this assay can detect is 138 copies/mL. A negative result does not preclude SARS-Cov-2 infection and should not be used as the sole basis for treatment or other patient management decisions. A negative result may occur with  improper specimen collection/handling, submission of specimen other than nasopharyngeal swab, presence of viral mutation(s) within the areas targeted by this assay, and inadequate number of viral copies(<138 copies/mL). A negative result must be combined with clinical observations, patient history, and epidemiological information. The expected result is Negative.  Fact Sheet for  Patients:  BloggerCourse.com  Fact Sheet for Healthcare Providers:  SeriousBroker.it  This test is no t yet approved or cleared by the [place] FDA and  has been authorized for detection and/or diagnosis of SARS-CoV-2 by FDA under an Emergency Use Authorization (EUA). This EUA will remain  in effect (meaning this test can be used) for the duration of the COVID-19 declaration under Section 564(b)(1) of the Act, 21 U.S.C.section 360bbb-3(b)(1), unless the authorization is terminated  or revoked sooner.       Influenza A by PCR NEGATIVE NEGATIVE   Influenza B by PCR NEGATIVE NEGATIVE    Comment: (NOTE) The Xpert Xpress SARS-CoV-2/FLU/RSV plus assay is intended as an aid in the diagnosis of influenza from Nasopharyngeal swab specimens and should not be used as a sole basis for treatment. Nasal washings and aspirates are unacceptable for Xpert Xpress SARS-CoV-2/FLU/RSV testing.  Fact Sheet for Patients: BloggerCourse.com  Fact Sheet for Healthcare Providers: SeriousBroker.it  This test is not yet approved or cleared by the [place] FDA and has been authorized for detection and/or diagnosis of SARS-CoV-2 by FDA under an Emergency Use Authorization (EUA). This EUA will remain in effect (meaning this test can be used) for the duration of the COVID-19 declaration under Section 564(b)(1) of the Act, 21 U.S.C. section 360bbb-3(b)(1), unless the authorization is terminated or revoked.  Performed at [facility], [address] [place], [place] [address]  Blood Alcohol level:  Lab Results  Component Value Date   ETH <10 12/16/2021   ETH <10 [id number]    Metabolic Disorder Labs:  No results found for: "HGBA1C", "MPG" No results found for: "PROLACTIN" No results found for: "CHOL", "TRIG", "HDL", "CHOLHDL", "VLDL", "LDLCALC"  Current Medications: Current  Facility-Administered Medications  Medication Dose Route Frequency Provider Last Rate Last Admin   acetaminophen (TYLENOL) tablet 650 mg  650 mg Oral Q6H PRN Patrecia Pour, NP   650 mg at 12/18/21 1144   alum & mag hydroxide-simeth (MAALOX/MYLANTA) 200-200-20 MG/5ML suspension 30 mL  30 mL Oral Q4H PRN Patrecia Pour, NP       benztropine (COGENTIN) tablet 1 mg  1 mg Oral Daily Patrecia Pour, NP   1 mg at 12/18/21 [id number]   gabapentin (NEURONTIN) capsule 300 mg  300 mg Oral TID Patrecia Pour, NP   300 mg at 12/18/21 1143   haloperidol (HALDOL) tablet 5 mg  5 mg Oral BID Patrecia Pour, NP   5 mg at 12/18/21 [id number]   magnesium hydroxide (MILK OF MAGNESIA) suspension 30 mL  30 mL Oral Daily PRN Patrecia Pour, NP       traZODone (DESYREL) tablet 50 mg  50 mg Oral QHS PRN Patrecia Pour, NP       PTA Medications: Medications Prior to Admission  Medication Sig Dispense Refill Last Dose   famotidine (PEPCID) 20 MG tablet Take 1 tablet (20 mg total) by mouth 2 (two) times daily for 15 days. (Patient not taking: Reported on 12/16/2021) 30 tablet 0    ondansetron (ZOFRAN-ODT) 8 MG disintegrating tablet Take 1 tablet (8 mg total) by mouth every 8 (eight) hours as needed for nausea or vomiting. (Patient not taking: Reported on 12/16/2021) 12 tablet 0    polyethylene glycol (MIRALAX) 17 g packet Take 17 g by mouth daily. (Patient not taking: Reported on 12/16/2021) 14 each 0     Musculoskeletal: Strength & Muscle Tone: within normal limits Gait & Station: normal Patient leans: N/A            Psychiatric Specialty Exam:  Presentation  General Appearance: Disheveled  Eye Contact:Minimal  Speech:Garbled  Speech Volume:Decreased  Handedness:No data recorded  Mood and Affect  Mood:-- (sleepy)  Affect:Blunt   Thought Process  Thought Processes:Coherent  Duration of Psychotic Symptoms: Greater than six months  Past Diagnosis of Schizophrenia or Psychoactive disorder:  Yes  Descriptions of Associations:Intact  Orientation:Full (Time, Place and Person)  Thought Content:WDL  Hallucinations:No data recorded Ideas of Reference:Delusions  Suicidal Thoughts:No data recorded Homicidal Thoughts:No data recorded  Sensorium  Memory:Immediate Poor  Judgment:Fair  Insight:Poor   Executive Functions  Concentration:Poor  Attention Span:Poor  Recall:Poor  Fund of Knowledge:Poor  Language:Poor   Psychomotor Activity  Psychomotor Activity:No data recorded  Assets  Assets:Financial Resources/Insurance; Resilience   Sleep  Sleep:No data recorded   Physical Exam: Physical Exam Vitals and nursing note reviewed.  Constitutional:      Appearance: Normal appearance.  HENT:     Head: Normocephalic and atraumatic.     Mouth/Throat:     Pharynx: Oropharynx is clear.  Eyes:     Pupils: Pupils are equal, round, and reactive to light.  Cardiovascular:     Rate and Rhythm: Normal rate and regular rhythm.  Pulmonary:     Effort: Pulmonary effort is normal.     Breath sounds: Normal breath sounds.  Abdominal:     General: Abdomen is  flat.     Palpations: Abdomen is soft.  Musculoskeletal:        General: Normal range of motion.  Skin:    General: Skin is warm and dry.  Neurological:     General: No focal deficit present.     Mental Status: He is alert. Mental status is at baseline.  Psychiatric:        Attention and Perception: Attention normal.        Mood and Affect: Mood normal. Affect is blunt.        Speech: Speech is delayed.        Behavior: Behavior is slowed.        Thought Content: Thought content normal.        Cognition and Memory: Memory is impaired.        Judgment: Judgment is inappropriate.    Review of Systems  Constitutional: Negative.   HENT: Negative.    Eyes: Negative.   Respiratory: Negative.    Cardiovascular: Negative.   Gastrointestinal: Negative.   Musculoskeletal: Negative.   Skin: Negative.    Neurological: Negative.   Psychiatric/Behavioral: Negative.     Blood pressure 110/66, pulse (!) 103, temperature 98 F (36.7 C), temperature source Oral, resp. rate 17, height 6\' 5"  (1.956 m), weight 90.7 kg, SpO2 100 %. Body mass index is 23.72 kg/m.  Treatment Plan Summary: Medication management and Plan patient was started on haloperidol on admission so far has been compliant with it without any evidence of side effects so we will continue that medicine.  Psychoeducation and supportive counseling and review of the serious nature of the possible diagnoses and the importance of treatment.  Patient does not appear to be a very reliable historian.  I have ordered some tests to follow-up on his HIV disease and is diagnosis of syphilis and placed an infectious disease consult.  Observation Level/Precautions:  15 minute checks  Laboratory:  UDS  Psychotherapy:    Medications:    Consultations:    Discharge Concerns:    Estimated LOS:  Other:     Physician Treatment Plan for Primary Diagnosis: Schizophrenia, unspecified ([place]) Long Term Goal(s): Improvement in symptoms so as ready for discharge  Short Term Goals: Ability to verbalize feelings will improve, Ability to demonstrate self-control will improve, and Ability to identify and develop effective coping behaviors will improve  Physician Treatment Plan for Secondary Diagnosis: Principal Problem:   Schizophrenia, unspecified ([place]) Active Problems:   Methamphetamine abuse ([place])   HIV disease ([place])  Long Term Goal(s): Improvement in symptoms so as ready for discharge  Short Term Goals: Compliance with prescribed medications will improve  I certify that inpatient services furnished can reasonably be expected to improve the patient's condition.    Alethia Berthold, MD 8/27/20231:51 PM

## 2021-12-18 NOTE — Progress Notes (Signed)
Pt denies SI/HI/AVH and verbally agrees to approach staff if these become apparent or before harming themselves/others. Rates depression 0/10. Rates anxiety 0/10. Rates pain 8/10 in his left ear and middle abdomen. Pt has been in his bed most of the day. Pt has gone to meals and is taking his medications. Pt stated that he had his appendix burst and that's why he has pain in his abdomen. Pt is polite and appreciative. Scheduled medications administered to pt, per MD orders. RN provided support and encouragement to pt. Q15 min safety checks implemented and continued. Pt safe on the unit. RN will continue to monitor and intervene as needed.   12/18/21 0828  Psych Admission Type (Psych Patients Only)  Admission Status Involuntary  Psychosocial Assessment  Patient Complaints None  Eye Contact Fair  Facial Expression Sad;Flat  Affect Flat;Sad  Speech Logical/coherent;Soft  Interaction Assertive  Motor Activity Other (Comment) (WDL)  Appearance/Hygiene In scrubs;Unremarkable  Behavior Characteristics Cooperative;Appropriate to situation;Calm  Mood Pleasant  Thought Process  Coherency WDL  Content Blaming others  Delusions None reported or observed  Perception WDL  Hallucination None reported or observed  Judgment Impaired  Confusion None  Danger to Self  Current suicidal ideation? Denies  Danger to Others  Danger to Others None reported or observed

## 2021-12-18 NOTE — [facility] Group Notes (Signed)
LCSW Wellness Group Note   12/18/2021 1:00pm  Type of Group and Topic: Psychoeducational Group:  Wellness  Participation Level:  did not attend  Description of Group  Wellness group introduces the topic and its focus on developing healthy habits across the spectrum and its relationship to a decrease in hospital admissions.  Six areas of wellness are discussed: physical, social spiritual, intellectual, occupational, and emotional.  Patients are asked to consider their current wellness habits and to identify areas of wellness where they are interested and able to focus on improvements.    Therapeutic Goals Patients will understand components of wellness and how they can positively impact overall health.  Patients will identify areas of wellness where they have developed good habits. Patients will identify areas of wellness where they would like to make improvements.    Summary of Patient Progress     Therapeutic Modalities: Cognitive Behavioral Therapy Psychoeducation    Jackquelyn Sundberg Jon, LCSW  

## 2021-12-19 DIAGNOSIS — F200 Paranoid schizophrenia: Secondary | ICD-10-CM | POA: Diagnosis not present

## 2021-12-19 DIAGNOSIS — F1510 Other stimulant abuse, uncomplicated: Secondary | ICD-10-CM

## 2021-12-19 DIAGNOSIS — F209 Schizophrenia, unspecified: Secondary | ICD-10-CM | POA: Diagnosis not present

## 2021-12-19 DIAGNOSIS — B20 Human immunodeficiency virus [HIV] disease: Secondary | ICD-10-CM | POA: Diagnosis not present

## 2021-12-19 NOTE — Group Note (Signed)
[facility] LCSW Group Therapy Note    Group Date: 12/19/2021 Start Time: 1300 End Time: 1400  Type of Therapy and Topic:  Group Therapy:  Overcoming Obstacles  Participation Level:  [facility] PARTICIPATION LEVEL: Did Not Attend   Description of Group:   In this group patients will be encouraged to explore what they see as obstacles to their own wellness and recovery. They will be guided to discuss their thoughts, feelings, and behaviors related to these obstacles. The group will process together ways to cope with barriers, with attention given to specific choices patients can make. Each patient will be challenged to identify changes they are motivated to make in order to overcome their obstacles. This group will be process-oriented, with patients participating in exploration of their own experiences as well as giving and receiving support and challenge from other group members.  Therapeutic Goals: 1. Patient will identify personal and current obstacles as they relate to admission. 2. Patient will identify barriers that currently interfere with their wellness or overcoming obstacles.  3. Patient will identify feelings, thought process and behaviors related to these barriers. 4. Patient will identify two changes they are willing to make to overcome these obstacles:    Summary of Patient Progress Group not held due to acuity.   Therapeutic Modalities:   Cognitive Behavioral Therapy Solution Focused Therapy Motivational Interviewing Relapse Prevention Therapy   Santiago Graf R Oseias Horsey, LCSW 

## 2021-12-19 NOTE — BH IP Treatment Plan (Signed)
Interdisciplinary Treatment and Diagnostic Plan Update  12/19/2021 Time of Session: 09:36 Michael Chandler MRN: [id number]  Principal Diagnosis: Schizophrenia, unspecified ([place])  Secondary Diagnoses: Principal Problem:   Schizophrenia, unspecified ([place]) Active Problems:   Methamphetamine abuse ([place])   HIV disease ([place])   Current Medications:  Current Facility-Administered Medications  Medication Dose Route Frequency Provider Last Rate Last Admin   acetaminophen (TYLENOL) tablet 650 mg  650 mg Oral Q6H PRN Patrecia Pour, NP   650 mg at 12/18/21 1144   alum & mag hydroxide-simeth (MAALOX/MYLANTA) 200-200-20 MG/5ML suspension 30 mL  30 mL Oral Q4H PRN Patrecia Pour, NP       benztropine (COGENTIN) tablet 1 mg  1 mg Oral Daily Patrecia Pour, NP   1 mg at 12/19/21 0756   gabapentin (NEURONTIN) capsule 300 mg  300 mg Oral TID Patrecia Pour, NP   300 mg at 12/19/21 0756   haloperidol (HALDOL) tablet 5 mg  5 mg Oral BID Patrecia Pour, NP   5 mg at 12/19/21 0756   magnesium hydroxide (MILK OF MAGNESIA) suspension 30 mL  30 mL Oral Daily PRN Patrecia Pour, NP       traZODone (DESYREL) tablet 50 mg  50 mg Oral QHS PRN Patrecia Pour, NP       PTA Medications: Medications Prior to Admission  Medication Sig Dispense Refill Last Dose   famotidine (PEPCID) 20 MG tablet Take 1 tablet (20 mg total) by mouth 2 (two) times daily for 15 days. (Patient not taking: Reported on 12/16/2021) 30 tablet 0    ondansetron (ZOFRAN-ODT) 8 MG disintegrating tablet Take 1 tablet (8 mg total) by mouth every 8 (eight) hours as needed for nausea or vomiting. (Patient not taking: Reported on 12/16/2021) 12 tablet 0    polyethylene glycol (MIRALAX) 17 g packet Take 17 g by mouth daily. (Patient not taking: Reported on 12/16/2021) 14 each 0     Patient Stressors: Marital or family conflict   Medication change or noncompliance    Patient Strengths: Marketing executive fund of knowledge  Supportive  family/friends   Treatment Modalities: Medication Management, Group therapy, Case management,  1 to 1 session with clinician, Psychoeducation, Recreational therapy.   Physician Treatment Plan for Primary Diagnosis: Schizophrenia, unspecified ([place]) Long Term Goal(s): Improvement in symptoms so as ready for discharge   Short Term Goals: Compliance with prescribed medications will improve Ability to verbalize feelings will improve Ability to demonstrate self-control will improve Ability to identify and develop effective coping behaviors will improve  Medication Management: Evaluate patient's response, side effects, and tolerance of medication regimen.  Therapeutic Interventions: 1 to 1 sessions, Unit Group sessions and Medication administration.  Evaluation of Outcomes: Not Met  Physician Treatment Plan for Secondary Diagnosis: Principal Problem:   Schizophrenia, unspecified ([place]) Active Problems:   Methamphetamine abuse ([place])   HIV disease ([place])  Long Term Goal(s): Improvement in symptoms so as ready for discharge   Short Term Goals: Compliance with prescribed medications will improve Ability to verbalize feelings will improve Ability to demonstrate self-control will improve Ability to identify and develop effective coping behaviors will improve     Medication Management: Evaluate patient's response, side effects, and tolerance of medication regimen.  Therapeutic Interventions: 1 to 1 sessions, Unit Group sessions and Medication administration.  Evaluation of Outcomes: Not Met   RN Treatment Plan for Primary Diagnosis: Schizophrenia, unspecified ([place]) Long Term Goal(s): Knowledge of disease and therapeutic regimen to maintain health  will improve  Short Term Goals: Ability to remain free from injury will improve, Ability to verbalize frustration and anger appropriately will improve, Ability to demonstrate self-control, Ability to participate in decision making will improve,  Ability to verbalize feelings will improve, Ability to disclose and discuss suicidal ideas, Ability to identify and develop effective coping behaviors will improve, and Compliance with prescribed medications will improve  Medication Management: RN will administer medications as ordered by provider, will assess and evaluate patient's response and provide education to patient for prescribed medication. RN will report any adverse and/or side effects to prescribing provider.  Therapeutic Interventions: 1 on 1 counseling sessions, Psychoeducation, Medication administration, Evaluate responses to treatment, Monitor vital signs and CBGs as ordered, Perform/monitor CIWA, COWS, AIMS and Fall Risk screenings as ordered, Perform wound care treatments as ordered.  Evaluation of Outcomes: Not Met   LCSW Treatment Plan for Primary Diagnosis: Schizophrenia, unspecified ([place]) Long Term Goal(s): Safe transition to appropriate next level of care at discharge, Engage patient in therapeutic group addressing interpersonal concerns.  Short Term Goals: Engage patient in aftercare planning with referrals and resources, Increase social support, Increase ability to appropriately verbalize feelings, Increase emotional regulation, Facilitate acceptance of mental health diagnosis and concerns, Facilitate patient progression through stages of change regarding substance use diagnoses and concerns, Identify triggers associated with mental health/substance abuse issues, and Increase skills for wellness and recovery  Therapeutic Interventions: Assess for all discharge needs, 1 to 1 time with Social worker, Explore available resources and support systems, Assess for adequacy in community support network, Educate family and significant other(s) on suicide prevention, Complete Psychosocial Assessment, Interpersonal group therapy.  Evaluation of Outcomes: Not Met   Progress in Treatment: Attending groups: No. Participating in  groups: No. Taking medication as prescribed: Yes. Toleration medication: Yes. Family/Significant other contact made: No, will contact:  mother, Michael Chandler. Patient understands diagnosis: Yes. Discussing patient identified problems/goals with staff: Yes. Medical problems stabilized or resolved: Yes. Denies suicidal/homicidal ideation: Yes. Issues/concerns per patient self-inventory: No. Other: none.  New problem(s) identified: No, Describe:  none identified.  New Short Term/Long Term Goal(s): detox, elimination of symptoms of psychosis, medication management for mood stabilization; elimination of SI thoughts; development of comprehensive mental wellness/sobriety plan.  Patient Goals: "Honestly, my goal is self-explanatory, come in and fix myself."    Discharge Plan or Barriers: CSW will assist with development of an appropriate aftercare/discharge plan.   Reason for Continuation of Hospitalization: Hallucinations Medication stabilization Suicidal ideation  Estimated Length of Stay: 1-7 days  Last 3 [place] Suicide Severity Risk Score: Flowsheet Row Admission (Current) from 12/17/2021 in [facility] from 12/16/2021 in [facility] from 12/03/2021 in [facility] No Risk No Risk No Risk       Last PHQ 2/9 Scores:     No data to display          Scribe for Treatment Team: Shirl Harris, LCSW 12/19/2021 11:12 AM

## 2021-12-19 NOTE — Progress Notes (Addendum)
Recreation Therapy Notes    Date: 12/19/2021   Time: 10:50 am     Location: Craft room    Behavioral response: N/A   Intervention Topic: Relaxation    Discussion/Intervention: Patient refused to attend group.    Clinical Observations/Feedback:  Patient refused to attend group.    Krystyna Cleckley LRT/CTRS        Aicha Clingenpeel 12/19/2021 12:38 PM

## 2021-12-19 NOTE — Progress Notes (Signed)
Pt presents with pleasant mood, affect congruent. Amahri reported he has been doing '' fine '' and denies any acute concerns this am. He reports he slept well and is eating well and denies any SI or HI or A/V Hallucinations. No signs pt is responding to internal stimuli. Pt asks this am potential for discharge, and states '' I am supposed to be moving into an apartment at the beginning of the month. I can't be staying here that long or I will lose my apartment.'' Patient informed to discuss the above with MD. Patient compliant with medications and no acute concerns voiced, able to make needs known. Pt is safe, will con't to monitor.

## 2021-12-19 NOTE — Progress Notes (Signed)
Patient calm and pleasant during assessment denying SI/HI/AVH. Patient endorses depression. Pt observed interacting appropriately with staff and peers on the unit. Pt given education, support, and encouragement to be active in his treatment plan. Pt being monitored Q 15 minutes for safety per unit protocol. Pt remains safe on the unit.

## 2021-12-19 NOTE — Progress Notes (Signed)
   12/19/21 0600  Psych Admission Type (Psych Patients Only)  Admission Status Involuntary  Psychosocial Assessment  Patient Complaints None  Eye Contact Fair  Facial Expression Flat;Sad  Affect Sad  Speech Logical/coherent;Soft  Interaction Assertive  Motor Activity Other (Comment) (WDL)  Appearance/Hygiene In scrubs;Unremarkable  Behavior Characteristics Cooperative;Appropriate to situation  Mood Pleasant  Thought Process  Coherency WDL  Content Blaming others  Delusions None reported or observed  Perception WDL  Hallucination None reported or observed  Judgment Impaired  Confusion None  Danger to Self  Current suicidal ideation? Denies  Danger to Others  Danger to Others None reported or observed

## 2021-12-19 NOTE — Consult Note (Signed)
NAMEKharter Chandler  DOB: 05/23/1985  MRN: [id number]  Date/Time: 12/19/2021 6:07 PM  REQUESTING PROVIDER: Dr.Clapacs Subjective:  REASON FOR CONSULT: HIV ? Michael Chandler is a 36 y.o. male with a history of schizophrenia, brought in with IVC paperwork as mother called police as pt was having auditory and visual hallucinations. E is admitted to behavioral health unit I am asked to see patient for HIV. There is a reactive HIV antibody from June 2023 Pt denies having Hiv and does not want to talk.   Past Medical History:  Diagnosis Date   HIV (human immunodeficiency virus infection) (HCC)    Schizophrenia (HCC)    per IVC paperwork, pt states does not have this dx    History reviewed. No pertinent surgical history.  Social History   Socioeconomic History   Marital status: Single    Spouse name: Not on file   Number of children: Not on file   Years of education: Not on file   Highest education level: Not on file  Occupational History   Not on file  Tobacco Use   Smoking status: Every Day   Smokeless tobacco: Never  Substance and Sexual Activity   Alcohol use: Yes    Comment: occasional   Drug use: Yes    Types: Methamphetamines, Marijuana, Cocaine    Comment: pt denies at this time   Sexual activity: Not on file  Other Topics Concern   Not on file  Social History Narrative   Not on file   Social Determinants of Health   Financial Resource Strain: Not on file  Food Insecurity: Not on file  Transportation Needs: Not on file  Physical Activity: Not on file  Stress: Not on file  Social Connections: Not on file  Intimate Partner Violence: Not on file    History reviewed. No pertinent family history. Allergies  Allergen Reactions   Bactrim [Sulfamethoxazole-Trimethoprim] Anaphylaxis   I? Current Facility-Administered Medications  Medication Dose Route Frequency Provider Last Rate Last Admin   acetaminophen (TYLENOL) tablet 650 mg  650 mg Oral Q6H PRN Charm Rings,  NP   650 mg at 12/18/21 1144   alum & mag hydroxide-simeth (MAALOX/MYLANTA) 200-200-20 MG/5ML suspension 30 mL  30 mL Oral Q4H PRN Charm Rings, NP       benztropine (COGENTIN) tablet 1 mg  1 mg Oral Daily Charm Rings, NP   1 mg at 12/19/21 0756   gabapentin (NEURONTIN) capsule 300 mg  300 mg Oral TID Charm Rings, NP   300 mg at 12/19/21 1654   haloperidol (HALDOL) tablet 5 mg  5 mg Oral BID Charm Rings, NP   5 mg at 12/19/21 1654   magnesium hydroxide (MILK OF MAGNESIA) suspension 30 mL  30 mL Oral Daily PRN Charm Rings, NP       traZODone (DESYREL) tablet 50 mg  50 mg Oral QHS PRN Charm Rings, NP         Abtx:  Anti-infectives (From admission, onward)    None       REVIEW OF SYSTEMS:  Const: negative fever, negative chills, negative weight loss Eyes: negative diplopia or visual changes, negative eye pain ENT: negative coryza, negative sore throat Resp: negative cough, hemoptysis, dyspnea Cards: negative for chest pain, palpitations, lower extremity edema GU: negative for frequency, dysuria and hematuria GI: some pain around umbilicus for many months And 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  Objective:  VITALS:  BP 110/66 (BP Location: Left Arm)   Pulse (!) 103   Temp 98 F (36.7 C) (Oral)   Resp 17   Ht 6\' 5"  (1.956 m)   Wt 90.7 kg   SpO2 100%   BMI 23.72 kg/m   PHYSICAL EXAM:  General: Alert, cooperative, no distress, appears stated age.  Head: Normocephalic, without obvious abnormality, atraumatic. Eyes: Conjunctivae clear, anicteric sclerae. Pupils are  equal ENT Nares normal. No drainage or sinus tenderness. Lips, mucosa, and tongue normal. No Thrush Neck: Supple, symmetrical, no adenopathy, thyroid: non tender no carotid bruit and no JVD. Back: No CVA tenderness. Lungs: Clear to auscultation bilaterally. No Wheezing or Rhonchi. No rales. Heart: Regular rate and rhythm, no murmur, rub or gallop. Abdomen: Soft, non-tender,not distended. Bowel sounds normal. No masses Extremities: atraumatic, no cyanosis. No edema. No clubbing Skin: No rashes or lesions. Or bruising Lymph: Cervical, supraclavicular normal. Neurologic: Grossly non-focal Pertinent Labs Lab Results CBC    Component Value Date/Time   WBC 4.5 12/16/2021 1536   RBC 4.16 (L) 12/16/2021 1536   HGB 11.2 (L) 12/16/2021 1536   HCT 35.0 (L) 12/16/2021 1536   PLT 258 12/16/2021 1536   MCV 84.1 12/16/2021 1536   MCH 26.9 12/16/2021 1536   MCHC 32.0 12/16/2021 1536   RDW 13.8 12/16/2021 1536   LYMPHSABS 1.1 09/29/2021 1714   MONOABS 0.8 09/29/2021 1714   EOSABS 0.1 09/29/2021 1714   BASOSABS 0.0 09/29/2021 1714       Latest Ref Rng & Units 12/16/2021    3:36 PM 12/02/2021    8:12 PM 11/15/2021   12:20 AM  CMP  Glucose 70 - 99 mg/dL 87  114  82   BUN 6 - 20 mg/dL 14  14  21    Creatinine 0.61 - 1.24 mg/dL 0.79  1.06  1.00   Sodium 135 - 145 mmol/L 136  136  137   Potassium 3.5 - 5.1 mmol/L 4.0  4.0  3.9   Chloride 98 - 111 mmol/L 103  106  107   CO2 22 - 32 mmol/L 28  26  25    Calcium 8.9 - 10.3 mg/dL 8.8  8.6  8.3   Total Protein 6.5 - 8.1 g/dL 7.9  7.8  8.0   Total Bilirubin 0.3 - 1.2 mg/dL 0.6  0.3  0.7   Alkaline Phos 38 - 126 U/L 129  115  126   AST 15 - 41 U/L 28  27  35   ALT 0 - 44 U/L 21  19  24        Microbiology: Recent Results (from the past 240 hour(s))  Resp Panel by RT-PCR (Flu A&B, Covid) Anterior Nasal Swab     Status: None   Collection Time: 12/16/21  5:36 PM   Specimen: Anterior Nasal Swab  Result Value Ref Range Status   SARS Coronavirus  2 by RT PCR NEGATIVE NEGATIVE Final    Comment: (NOTE) SARS-CoV-2 target nucleic acids are NOT DETECTED.  The SARS-CoV-2 RNA is generally detectable in upper respiratory specimens during the acute phase of infection. The lowest concentration of SARS-CoV-2 viral copies this assay can detect is 138 copies/mL. A negative result does not preclude SARS-Cov-2  infection and should not be used as the sole basis for treatment or other patient management decisions. A negative result may occur with  improper specimen collection/handling, submission of specimen other than nasopharyngeal swab, presence of viral mutation(s) within the areas targeted by this assay, and inadequate number of viral copies(<138 copies/mL). A negative result must be combined with clinical observations, patient history, and epidemiological information. The expected result is Negative.  Fact Sheet for Patients:  BloggerCourse.com  Fact Sheet for Healthcare Providers:  SeriousBroker.it  This test is no t yet approved or cleared by the [place] FDA and  has been authorized for detection and/or diagnosis of SARS-CoV-2 by FDA under an Emergency Use Authorization (EUA). This EUA will remain  in effect (meaning this test can be used) for the duration of the COVID-19 declaration under Section 564(b)(1) of the Act, 21 U.S.C.section 360bbb-3(b)(1), unless the authorization is terminated  or revoked sooner.       Influenza A by PCR NEGATIVE NEGATIVE Final   Influenza B by PCR NEGATIVE NEGATIVE Final    Comment: (NOTE) The Xpert Xpress SARS-CoV-2/FLU/RSV plus assay is intended as an aid in the diagnosis of influenza from Nasopharyngeal swab specimens and should not be used as a sole basis for treatment. Nasal washings and aspirates are unacceptable for Xpert Xpress SARS-CoV-2/FLU/RSV testing.  Fact Sheet for Patients: BloggerCourse.com  Fact  Sheet for Healthcare Providers: SeriousBroker.it  This test is not yet approved or cleared by the [place] FDA and has been authorized for detection and/or diagnosis of SARS-CoV-2 by FDA under an Emergency Use Authorization (EUA). This EUA will remain in effect (meaning this test can be used) for the duration of the COVID-19 declaration under Section 564(b)(1) of the Act, 21 U.S.C. section 360bbb-3(b)(1), unless the authorization is terminated or revoked.  Performed at [facility], [address], [place], [place] [address]     IMAGING RESULTS:none I have personally reviewed the films ? Impression/Recommendation ?HIV- pt denies that he hs HIV and does not want to talk- Need to see the patient with Dr.Clapacs. Unless he acknowledges HIV he will not take any medication  Schizophrenia with auditory and visual hallucination On haldol, benztropine  Amphetamine substance use  RPR reactive in June- does not look like he got benzathine penicillin- will need 3 doses ? LP ? ? ___________________________________________________ Discussed with  requesting provider Note:  This document was prepared using Dragon voice recognition software and may include unintentional dictation errors.

## 2021-12-19 NOTE — [facility] Group Notes (Signed)
[facility] Group Notes:  (Nursing/MHT/Case Management/Adjunct)  Date:  12/19/2021  Time:  10:26 AM  Type of Therapy:   Community Meeting  Participation Level:  Did Not Attend   Lynelle Smoke [facility] 12/19/2021, 10:26 AM

## 2021-12-19 NOTE — Progress Notes (Signed)
[facility] MD Progress Note  12/19/2021 11:34 AM Michael Chandler  MRN:  [id number] Subjective: Patient seen for follow-up.  Patient attended treatment team.  Continues to deny all symptoms and minimizes reasons for hospitalization.  When asked about the concerns his mother has became as irate and appeared angry towards his mother.  Mostly staying in his room.  Taking medications.  Suggested to him the option of switching to long-acting injectable medication which he rejected.  Labs reviewed.  Lipid panel and hemoglobin A1c are back and are okay but the infectious disease labs are still pending Principal Problem: Schizophrenia, unspecified (HCC) Diagnosis: Principal Problem:   Schizophrenia, unspecified (HCC) Active Problems:   Methamphetamine abuse (HCC)   HIV disease (HCC)  Total Time spent with patient: 30 minutes  Past Psychiatric History: Past history of what appears to be schizophrenia complicated by amphetamine abuse possibly complicated by HIV disease  Past Medical History:  Past Medical History:  Diagnosis Date   HIV (human immunodeficiency virus infection) (HCC)    Schizophrenia (HCC)    per IVC paperwork, pt states does not have this dx   History reviewed. No pertinent surgical history. Family History: History reviewed. No pertinent family history. Family Psychiatric  History: See previous Social History:  Social History   Substance and Sexual Activity  Alcohol Use Yes   Comment: occasional     Social History   Substance and Sexual Activity  Drug Use Yes   Types: Methamphetamines, Marijuana, Cocaine   Comment: pt denies at this time    Social History   Socioeconomic History   Marital status: Single    Spouse name: Not on file   Number of children: Not on file   Years of education: Not on file   Highest education level: Not on file  Occupational History   Not on file  Tobacco Use   Smoking status: Every Day   Smokeless tobacco: Never  Substance and Sexual Activity    Alcohol use: Yes    Comment: occasional   Drug use: Yes    Types: Methamphetamines, Marijuana, Cocaine    Comment: pt denies at this time   Sexual activity: Not on file  Other Topics Concern   Not on file  Social History Narrative   Not on file   Social Determinants of Health   Financial Resource Strain: Not on file  Food Insecurity: Not on file  Transportation Needs: Not on file  Physical Activity: Not on file  Stress: Not on file  Social Connections: Not on file   Additional Social History:                         Sleep: Fair  Appetite:  Fair  Current Medications: Current Facility-Administered Medications  Medication Dose Route Frequency Provider Last Rate Last Admin   acetaminophen (TYLENOL) tablet 650 mg  650 mg Oral Q6H PRN Charm Rings, NP   650 mg at 12/18/21 1144   alum & mag hydroxide-simeth (MAALOX/MYLANTA) 200-200-20 MG/5ML suspension 30 mL  30 mL Oral Q4H PRN Charm Rings, NP       benztropine (COGENTIN) tablet 1 mg  1 mg Oral Daily Charm Rings, NP   1 mg at 12/19/21 0756   gabapentin (NEURONTIN) capsule 300 mg  300 mg Oral TID Charm Rings, NP   300 mg at 12/19/21 0756   haloperidol (HALDOL) tablet 5 mg  5 mg Oral BID Charm Rings, NP   5  mg at 12/19/21 0756   magnesium hydroxide (MILK OF MAGNESIA) suspension 30 mL  30 mL Oral Daily PRN Charm Rings, NP       traZODone (DESYREL) tablet 50 mg  50 mg Oral QHS PRN Charm Rings, NP        Lab Results:  Results for orders placed or performed during the hospital encounter of 12/17/21 (from the past 48 hour(s))  Lipid panel     Status: Abnormal   Collection Time: 12/18/21  2:45 PM  Result Value Ref Range   Cholesterol 160 0 - 200 mg/dL   Triglycerides 163 (H) <150 mg/dL   HDL 30 (L) >84 mg/dL   Total CHOL/HDL Ratio 5.3 RATIO   VLDL 80 (H) 0 - 40 mg/dL   LDL Cholesterol 50 0 - 99 mg/dL    Comment:        Total Cholesterol/HDL:CHD Risk Coronary Heart Disease Risk Table                      Men   Women  1/2 Average Risk   3.4   3.3  Average Risk       5.0   4.4  2 X Average Risk   9.6   7.1  3 X Average Risk  23.4   11.0        Use the calculated Patient Ratio above and the CHD Risk Table to determine the patient's CHD Risk.        ATP III CLASSIFICATION (LDL):  <100     mg/dL   Optimal  665-993  mg/dL   Near or Above                    Optimal  130-159  mg/dL   Borderline  570-177  mg/dL   High  >939     mg/dL   Very High Performed at [facility], [address] [place], [place] [address]   Hemoglobin A1c     Status: Abnormal   Collection Time: 12/18/21  2:45 PM  Result Value Ref Range   Hgb A1c MFr Bld 6.2 (H) 4.8 - 5.6 %    Comment: (NOTE) Pre diabetes:          5.7%-6.4%  Diabetes:              >6.4%  Glycemic control for   <7.0% adults with diabetes    Mean Plasma Glucose 131.24 mg/dL    Comment: Performed at [facility], [address], [place], [place] [address]    Blood Alcohol level:  Lab Results  Component Value Date   [facility] <10 12/16/2021   ETH <10 11/15/2021    Metabolic Disorder Labs: Lab Results  Component Value Date   HGBA1C 6.2 (H) 12/18/2021   MPG 131.24 12/18/2021   No results found for: "PROLACTIN" Lab Results  Component Value Date   CHOL 160 12/18/2021   TRIG 399 (H) 12/18/2021   HDL 30 (L) 12/18/2021   CHOLHDL 5.3 12/18/2021   VLDL 80 (H) 12/18/2021   LDLCALC 50 12/18/2021    Physical Findings: AIMS: Facial and Oral Movements Muscles of Facial Expression: None, normal Lips and Perioral Area: None, normal Jaw: None, normal Tongue: None, normal,Extremity Movements Upper (arms, wrists, hands, fingers): None, normal Lower (legs, knees, ankles, toes): None, normal, Trunk Movements Neck, shoulders, hips: None, normal, Overall Severity Severity of abnormal movements (highest score from questions above): None, normal Incapacitation due  to abnormal movements: None, normal Patient's awareness of  abnormal movements (rate only patient's report): No Awareness, Dental Status Current problems with teeth and/or dentures?: No Does patient usually wear dentures?: No  CIWA:    COWS:     Musculoskeletal: Strength & Muscle Tone: within normal limits Gait & Station: normal Patient leans: N/A  Psychiatric Specialty Exam:  Presentation  General Appearance: Disheveled  Eye Contact:Minimal  Speech:Garbled  Speech Volume:Decreased  Handedness:No data recorded  Mood and Affect  Mood:-- (sleepy)  Affect:Blunt   Thought Process  Thought Processes:Coherent  Descriptions of Associations:Intact  Orientation:Full (Time, Place and Person)  Thought Content:WDL  History of Schizophrenia/Schizoaffective disorder:Yes  Duration of Psychotic Symptoms:Greater than six months  Hallucinations:No data recorded Ideas of Reference:Delusions  Suicidal Thoughts:No data recorded Homicidal Thoughts:No data recorded  Sensorium  Memory:Immediate Poor  Judgment:Fair  Insight:Poor   Executive Functions  Concentration:Poor  Attention Span:Poor  Recall:Poor  Fund of Knowledge:Poor  Language:Poor   Psychomotor Activity  Psychomotor Activity:No data recorded  Assets  Assets:Financial Resources/Insurance; Resilience   Sleep  Sleep:No data recorded   Physical Exam: Physical Exam Vitals and nursing note reviewed.  Constitutional:      Appearance: Normal appearance.  HENT:     Head: Normocephalic and atraumatic.     Mouth/Throat:     Pharynx: Oropharynx is clear.  Eyes:     Pupils: Pupils are equal, round, and reactive to light.  Cardiovascular:     Rate and Rhythm: Normal rate and regular rhythm.  Pulmonary:     Effort: Pulmonary effort is normal.     Breath sounds: Normal breath sounds.  Abdominal:     General: Abdomen is flat.     Palpations: Abdomen is soft.  Musculoskeletal:        General: Normal range of motion.  Skin:    General: Skin is warm and dry.   Neurological:     General: No focal deficit present.     Mental Status: He is alert. Mental status is at baseline.  Psychiatric:        Attention and Perception: He is inattentive.        Mood and Affect: Mood normal. Affect is blunt.        Speech: Speech is delayed.        Behavior: Behavior is withdrawn.        Thought Content: Thought content is paranoid.    Review of Systems  Constitutional: Negative.   HENT: Negative.    Eyes: Negative.   Respiratory: Negative.    Cardiovascular: Negative.   Gastrointestinal: Negative.   Musculoskeletal: Negative.   Skin: Negative.   Neurological: Negative.   Psychiatric/Behavioral:  Negative for depression, hallucinations, substance abuse and suicidal ideas. The patient is not nervous/anxious.    Blood pressure 110/66, pulse (!) 103, temperature 98 F (36.7 C), temperature source Oral, resp. rate 17, height 6\' 5"  (1.956 m), weight 90.7 kg, SpO2 100 %. Body mass index is 23.72 kg/m.   Treatment Plan Summary: Medication management and Plan continue current Haldol.  Infectious disease consult was requested.  Encourage group attendance.  Tried to form some rapport with the patient.  Not yet ready for discharge  , MD 12/19/2021, 11:34 AM

## 2021-12-20 DIAGNOSIS — F1510 Other stimulant abuse, uncomplicated: Secondary | ICD-10-CM | POA: Diagnosis not present

## 2021-12-20 DIAGNOSIS — F200 Paranoid schizophrenia: Secondary | ICD-10-CM | POA: Diagnosis not present

## 2021-12-20 DIAGNOSIS — B20 Human immunodeficiency virus [HIV] disease: Secondary | ICD-10-CM | POA: Diagnosis not present

## 2021-12-20 DIAGNOSIS — F209 Schizophrenia, unspecified: Secondary | ICD-10-CM | POA: Diagnosis not present

## 2021-12-20 LAB — HELPER T-LYMPH-CD4 (ARMC ONLY)
% CD 4 Pos. Lymph.: 6.6 % — ABNORMAL LOW (ref 30.8–58.5)
Absolute CD 4 Helper: 99 /uL — ABNORMAL LOW (ref 359–1519)
Basophils Absolute: 0 10*3/uL (ref 0.0–0.2)
Basos: 0 %
EOS (ABSOLUTE): 0.1 10*3/uL (ref 0.0–0.4)
Eos: 3 %
Hematocrit: 36 % — ABNORMAL LOW (ref 37.5–51.0)
Hemoglobin: 11.7 g/dL — ABNORMAL LOW (ref 13.0–17.7)
Immature Grans (Abs): 0 10*3/uL (ref 0.0–0.1)
Immature Granulocytes: 1 %
Lymphocytes Absolute: 1.5 10*3/uL (ref 0.7–3.1)
Lymphs: 28 %
MCH: 27.3 pg (ref 26.6–33.0)
MCHC: 32.5 g/dL (ref 31.5–35.7)
MCV: 84 fL (ref 79–97)
Monocytes Absolute: 1.1 10*3/uL — ABNORMAL HIGH (ref 0.1–0.9)
Monocytes: 20 %
Neutrophils Absolute: 2.5 10*3/uL (ref 1.4–7.0)
Neutrophils: 48 %
Platelets: 239 10*3/uL (ref 150–450)
RBC: 4.29 x10E6/uL (ref 4.14–5.80)
RDW: 14.2 % (ref 11.6–15.4)
WBC: 5.3 10*3/uL (ref 3.4–10.8)

## 2021-12-20 LAB — HIV-1 RNA QUANT-NO REFLEX-BLD
HIV 1 RNA Quant: 174000 copies/mL
LOG10 HIV-1 RNA: 5.241 log10copy/mL

## 2021-12-20 MED ORDER — PENICILLIN G BENZATHINE 1200000 UNIT/2ML IM SUSY
2.4000 10*6.[IU] | PREFILLED_SYRINGE | Freq: Once | INTRAMUSCULAR | Status: AC
  Administered 2021-12-20: 2.4 10*6.[IU] via INTRAMUSCULAR
  Filled 2021-12-20: qty 4

## 2021-12-20 MED ORDER — DARUN-COBIC-EMTRICIT-TENOFAF 800-150-200-10 MG PO TABS
1.0000 | ORAL_TABLET | Freq: Every day | ORAL | Status: DC
Start: 2021-12-21 — End: 2021-12-21
  Administered 2021-12-21: 1 via ORAL
  Filled 2021-12-20 (×2): qty 1

## 2021-12-20 MED ORDER — TRAZODONE HCL 50 MG PO TABS
150.0000 mg | ORAL_TABLET | Freq: Every evening | ORAL | Status: DC | PRN
  Administered 2021-12-20: 150 mg via ORAL
  Filled 2021-12-20: qty 1

## 2021-12-20 NOTE — Progress Notes (Signed)
ID Pt seen with Dr..Clapacs Pt is doing much better today Acknowledges he has HIV and was on Symtuza- used to be in care when he was in [place] he was treated for syphilis in 2018  O/e awake and alert, in no distress- oriented X5 ambulating Skin no rash CNS non focal  Labs    Latest Ref Rng & Units 12/18/2021    2:45 PM 12/16/2021    3:36 PM 12/02/2021    8:12 PM  CBC  WBC 3.4 - 10.8 x10E3/uL 5.3  4.5  4.9   Hemoglobin 13.0 - 17.7 g/dL 67.5  91.6  38.4   Hematocrit 37.5 - 51.0 % 36.0  35.0  33.9   Platelets 150 - 450 x10E3/uL 239  258  281        Latest Ref Rng & Units 12/16/2021    3:36 PM 12/02/2021    8:12 PM 11/15/2021   12:20 AM  CMP  Glucose 70 - 99 mg/dL 87  665  82   BUN 6 - 20 mg/dL 14  14  21    Creatinine 0.61 - 1.24 mg/dL  9.93  5.70   Sodium 135 - 145 mmol/L 136  136  137   Potassium 3.5 - 5.1 mmol/L 4.0  4.0  3.9   Chloride 98 - 111 mmol/L 103  106  107   CO2 22 - 32 mmol/L 28  26  25    Calcium 8.9 - 10.3 mg/dL 8.8  8.6  8.3   Total Protein 6.5 - 8.1 g/dL 7.9  7.8  8.0   Total Bilirubin 0.3 - 1.2 mg/dL 0.6  0.3  0.7   Alkaline Phos 38 - 126 U/L 129  115  126   AST 15 - 41 U/L 28  27  35   ALT 0 - 44 U/L 21  19  24      UDS- amphetamine HIV RNA 174000 from 12/18/21 Cd4 is 99 ( 6.8%) from 12/18/21  Impression/recommendation HIV/AIDS Pt restarted on symtuza  ( Darunavir+ cobicistat+ TDF+ FTC)today Vl 174K and cd4 is 99 PCP prophylaxis is beneficial but I am afraid if we give him too many meds to take he will be non compliant to all  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 Pt seen with Dr.Clapacs

## 2021-12-20 NOTE — Progress Notes (Signed)
[facility] MD Progress Note  12/20/2021 2:30 PM Michael Chandler  MRN:  [id number] Subjective: Patient seen and chart reviewed.  Patient has been calm and denies any hallucinations.  Does not present as obviously delusional.  Today in conversation with me he did admit that he needed to take HIV medicine and was able to tell me the medicine he has been on in the past.  Once again he repeated his claim that he had gotten full treatment for syphilis.  RPR titer has not come back but CD4 count did and is low as expected Principal Problem: Schizophrenia, unspecified (HCC) Diagnosis: Principal Problem:   Schizophrenia, unspecified (HCC) Active Problems:   Methamphetamine abuse (HCC)   HIV disease (HCC)  Total Time spent with patient: 30 minutes  Past Psychiatric History: Past history of schizophrenia from what I understand  Past Medical History:  Past Medical History:  Diagnosis Date   HIV (human immunodeficiency virus infection) (HCC)    Schizophrenia (HCC)    per IVC paperwork, pt states does not have this dx   History reviewed. No pertinent surgical history. Family History: History reviewed. No pertinent family history. Family Psychiatric  History: See previous Social History:  Social History   Substance and Sexual Activity  Alcohol Use Yes   Comment: occasional     Social History   Substance and Sexual Activity  Drug Use Yes   Types: Methamphetamines, Marijuana, Cocaine   Comment: pt denies at this time    Social History   Socioeconomic History   Marital status: Single    Spouse name: Not on file   Number of children: Not on file   Years of education: Not on file   Highest education level: Not on file  Occupational History   Not on file  Tobacco Use   Smoking status: Every Day   Smokeless tobacco: Never  Substance and Sexual Activity   Alcohol use: Yes    Comment: occasional   Drug use: Yes    Types: Methamphetamines, Marijuana, Cocaine    Comment: pt denies at this time    Sexual activity: Not on file  Other Topics Concern   Not on file  Social History Narrative   Not on file   Social Determinants of Health   Financial Resource Strain: Not on file  Food Insecurity: Not on file  Transportation Needs: Not on file  Physical Activity: Not on file  Stress: Not on file  Social Connections: Not on file   Additional Social History:                         Sleep: Fair  Appetite:  Fair  Current Medications: Current Facility-Administered Medications  Medication Dose Route Frequency Provider Last Rate Last Admin   acetaminophen (TYLENOL) tablet 650 mg  650 mg Oral Q6H PRN Charm Rings, NP   650 mg at 12/19/21 1928   alum & mag hydroxide-simeth (MAALOX/MYLANTA) 200-200-20 MG/5ML suspension 30 mL  30 mL Oral Q4H PRN Charm Rings, NP       benztropine (COGENTIN) tablet 1 mg  1 mg Oral Daily Charm Rings, NP   1 mg at 12/20/21 0907   [START ON 12/21/2021] Darunavir-Cobicistat-Emtricitabine-Tenofovir Alafenamide (SYMTUZA) 800-150-200-10 MG TABS 1 tablet  1 tablet Oral Q breakfast Rayn Shorb T, MD       gabapentin (NEURONTIN) capsule 300 mg  300 mg Oral TID Charm Rings, NP   300 mg at 12/20/21 1150  haloperidol (HALDOL) tablet 5 mg  5 mg Oral BID Charm Rings, NP   5 mg at 12/20/21 2536   magnesium hydroxide (MILK OF MAGNESIA) suspension 30 mL  30 mL Oral Daily PRN Charm Rings, NP       traZODone (DESYREL) tablet 50 mg  50 mg Oral QHS PRN Charm Rings, NP   50 mg at 12/19/21 2102    Lab Results:  Results for orders placed or performed during the hospital encounter of 12/17/21 (from the past 48 hour(s))  Helper T-Lymph-CD4 [facility] only)     Status: Abnormal   Collection Time: 12/18/21  2:45 PM  Result Value Ref Range   Absolute CD 4 Helper 99 (L) 359 - 1,519 /uL   % CD 4 Pos. Lymph. 6.6 (L) 30.8 - 58.5 %   WBC 5.3 3.4 - 10.8 x10E3/uL   RBC 4.29 4.14 - 5.80 x10E6/uL   Hematocrit 36.0 (L) 37.5 - 51.0 %   MCV 84 79 - 97 fL    MCH 27.3 26.6 - 33.0 pg   MCHC 32.5 31.5 - 35.7 g/dL   RDW 64.4 03.4 - 74.2 %   Platelets 239 150 - 450 x10E3/uL   Neutrophils 48 Not Estab. %   Lymphs 28 Not Estab. %   Monocytes 20 Not Estab. %   Eos 3 Not Estab. %   Basos 0 Not Estab. %   Neutrophils Absolute 2.5 1.4 - 7.0 x10E3/uL   Lymphocytes Absolute 1.5 0.7 - 3.1 x10E3/uL   Monocytes Absolute 1.1 (H) 0.1 - 0.9 x10E3/uL   EOS (ABSOLUTE) 0.1 0.0 - 0.4 x10E3/uL   Basophils Absolute 0.0 0.0 - 0.2 x10E3/uL   Immature Granulocytes 1 Not Estab. %   Immature Grans (Abs) 0.0 0.0 - 0.1 x10E3/uL    Comment: (NOTE) Performed At: [facility] [address] [place], [place] [id number] Jolene Schimke MD [id number]    Hemoglobin 11.7 (L) 13.0 - 17.7 g/dL  Lipid panel     Status: Abnormal   Collection Time: 12/18/21  2:45 PM  Result Value Ref Range   Cholesterol 160 0 - 200 mg/dL   Triglycerides 606 (H) <150 mg/dL   HDL 30 (L) >30 mg/dL   Total CHOL/HDL Ratio 5.3 RATIO   VLDL 80 (H) 0 - 40 mg/dL   LDL Cholesterol 50 0 - 99 mg/dL    Comment:        Total Cholesterol/HDL:CHD Risk Coronary Heart Disease Risk Table                     Men   Women  1/2 Average Risk   3.4   3.3  Average Risk       5.0   4.4  2 X Average Risk   9.6   7.1  3 X Average Risk  23.4   11.0        Use the calculated Patient Ratio above and the CHD Risk Table to determine the patient's CHD Risk.        ATP III CLASSIFICATION (LDL):  <100     mg/dL   Optimal  160-109  mg/dL   Near or Above                    Optimal  130-159  mg/dL   Borderline  323-557  mg/dL   High  >322     mg/dL   Very High Performed at [facility], [address]  [place], [place] [address]   Hemoglobin A1c     Status: Abnormal   Collection Time: 12/18/21  2:45 PM  Result Value Ref Range   Hgb A1c MFr Bld 6.2 (H) 4.8 - 5.6 %    Comment: (NOTE) Pre diabetes:          5.7%-6.4%  Diabetes:              >6.4%  Glycemic control for   <7.0% adults with  diabetes    Mean Plasma Glucose 131.24 mg/dL    Comment: Performed at [facility], [address] [place], [place] [address]    Blood Alcohol level:  Lab Results  Component Value Date   [facility] <10 12/16/2021   ETH <10 11/15/2021    Metabolic Disorder Labs: Lab Results  Component Value Date   HGBA1C 6.2 (H) 12/18/2021   MPG 131.24 12/18/2021   No results found for: "PROLACTIN" Lab Results  Component Value Date   CHOL 160 12/18/2021   TRIG 399 (H) 12/18/2021   HDL 30 (L) 12/18/2021   CHOLHDL 5.3 12/18/2021   VLDL 80 (H) 12/18/2021   LDLCALC 50 12/18/2021    Physical Findings: AIMS: Facial and Oral Movements Muscles of Facial Expression: None, normal Lips and Perioral Area: None, normal Jaw: None, normal Tongue: None, normal,Extremity Movements Upper (arms, wrists, hands, fingers): None, normal Lower (legs, knees, ankles, toes): None, normal, Trunk Movements Neck, shoulders, hips: None, normal, Overall Severity Severity of abnormal movements (highest score from questions above): None, normal Incapacitation due to abnormal movements: None, normal Patient's awareness of abnormal movements (rate only patient's report): No Awareness, Dental Status Current problems with teeth and/or dentures?: No Does patient usually wear dentures?: No  CIWA:    COWS:     Musculoskeletal: Strength & Muscle Tone: within normal limits Gait & Station: normal Patient leans: N/A  Psychiatric Specialty Exam:  Presentation  General Appearance: Disheveled  Eye Contact:Minimal  Speech:Garbled  Speech Volume:Decreased  Handedness:No data recorded  Mood and Affect  Mood:-- (sleepy)  Affect:Blunt   Thought Process  Thought Processes:Coherent  Descriptions of Associations:Intact  Orientation:Full (Time, Place and Person)  Thought Content:WDL  History of Schizophrenia/Schizoaffective disorder:Yes  Duration of Psychotic Symptoms:Greater than six  months  Hallucinations:No data recorded Ideas of Reference:Delusions  Suicidal Thoughts:No data recorded Homicidal Thoughts:No data recorded  Sensorium  Memory:Immediate Poor  Judgment:Fair  Insight:Poor   Executive Functions  Concentration:Poor  Attention Span:Poor  Recall:Poor  Fund of Knowledge:Poor  Language:Poor   Psychomotor Activity  Psychomotor Activity:No data recorded  Assets  Assets:Financial Resources/Insurance; Resilience   Sleep  Sleep:No data recorded   Physical Exam: Physical Exam Vitals and nursing note reviewed.  Constitutional:      Appearance: Normal appearance.  HENT:     Head: Normocephalic and atraumatic.     Mouth/Throat:     Pharynx: Oropharynx is clear.  Eyes:     Pupils: Pupils are equal, round, and reactive to light.  Cardiovascular:     Rate and Rhythm: Normal rate and regular rhythm.  Pulmonary:     Effort: Pulmonary effort is normal.     Breath sounds: Normal breath sounds.  Abdominal:     General: Abdomen is flat.     Palpations: Abdomen is soft.  Musculoskeletal:        General: Normal range of motion.  Skin:    General: Skin is warm and dry.  Neurological:     General: No focal deficit present.  Mental Status: He is alert. Mental status is at baseline.  Psychiatric:        Mood and Affect: Mood normal.        Thought Content: Thought content normal.    Review of Systems  Constitutional: Negative.   HENT: Negative.    Eyes: Negative.   Respiratory: Negative.    Cardiovascular: Negative.   Gastrointestinal: Negative.   Musculoskeletal: Negative.   Skin: Negative.   Neurological: Negative.   Psychiatric/Behavioral: Negative.     Blood pressure 110/66, pulse (!) 103, temperature 98 F (36.7 C), temperature source Oral, resp. rate 17, height 6\' 5"  (1.956 m), weight 90.7 kg, SpO2 100 %. Body mass index is 23.72 kg/m.   Treatment Plan Summary: Medication management and Plan restart HIV medicine based  on his previous treatment that he tells me.  Reviewed with him antipsychotic medicine.  Possible discharge tomorrow  , MD 12/20/2021, 2:30 PM

## 2021-12-20 NOTE — Progress Notes (Signed)
Patient calm and pleasant during assessment denying SI/HI/AVH. Patient endorses depression. Pt observed interacting appropriately with staff and peers on the unit. Pt given education, support, and encouragement to be active in his treatment plan. Pt being monitored Q 15 minutes for safety per unit protocol. Pt remains safe on the unit.  

## 2021-12-20 NOTE — Group Note (Signed)
LCSW Group Therapy Note  Group Date: 12/20/2021 Start Time: 1300 End Time: 1400   Type of Therapy and Topic:  Group Therapy: Using "I" Statements  Participation Level:  Active  Description of Group:  Patients were asked to provide details of some interpersonal conflicts they have experienced. Patients were then educated about "I" statements, communication which focuses on feelings or views of the speaker rather than what the other person is doing. T group members were asked to reflect on past conflicts and to provide specific examples for utilizing "I" statements.  Therapeutic Goals:  Patients will verbalize understanding of ineffective communication and effective communication. Patients will be able to empathize with whom they are having conflict. Patients will practice effective communication in the form of "I" statements.  Summary of Patient Progress:    Patient was present for the entirety of the group session. Patient was an active listener and participated in the topic of discussion, provided helpful advice to others, and added nuance to topic of conversation. Patient shared that he can often be misinterpreted. Also reiterated the need to understand how tone, body language, word choice can affect how another person receives the message.   Therapeutic Modalities:   Cognitive Behavioral Therapy Solution-Focused Therapy  Corky Crafts, [place] 12/20/2021  2:29 PM

## 2021-12-20 NOTE — Progress Notes (Signed)
Recreation Therapy Notes   Date: 12/20/2021  Time: 10:15 am    Location: Courtyard     Behavioral response: N/A   Intervention Topic: Leisure     Discussion/Intervention: Patient refused to attend group.   Clinical Observations/Feedback:  Patient refused to attend group.    Mattea Seger LRT/CTRS        Roxane Puerto 12/20/2021 12:56 PM

## 2021-12-20 NOTE — Plan of Care (Signed)
D- Patient alert and oriented. Patient presented in a pleasant mood on assessment stating that he slept "on and off", last night, in which he stated that "I need something to relax me", in which he stated that he wanted to go back to sleep. This Clinical research associate explained to patient that sleep meds are not given out during the day. Patient was reassured that this information would be passed along to the MD. Patient denied SI, HI, AVH, and pain at this time. Patient also denied any signs/symptoms of depression/anxiety. Patient had no stated goals for today.  A- Scheduled medications administered to patient, per MD orders. Support and encouragement provided.  Routine safety checks conducted every 15 minutes.  Patient informed to notify staff with problems or concerns.  R- No adverse drug reactions noted. Patient contracts for safety at this time. Patient compliant with medications and treatment plan. Patient receptive, calm, and cooperative. Patient interacts well with others on the unit.  Patient remains safe at this time.  Problem: Education: Goal: Knowledge of General Education information will improve Description: Including pain rating scale, medication(s)/side effects and non-pharmacologic comfort measures Outcome: Progressing   Problem: Health Behavior/Discharge Planning: Goal: Ability to manage health-related needs will improve Outcome: Progressing   Problem: Clinical Measurements: Goal: Ability to maintain clinical measurements within normal limits will improve Outcome: Progressing Goal: Will remain free from infection Outcome: Progressing Goal: Diagnostic test results will improve Outcome: Progressing Goal: Respiratory complications will improve Outcome: Progressing Goal: Cardiovascular complication will be avoided Outcome: Progressing   Problem: Activity: Goal: Risk for activity intolerance will decrease Outcome: Progressing   Problem: Nutrition: Goal: Adequate nutrition will be  maintained Outcome: Progressing   Problem: Coping: Goal: Level of anxiety will decrease Outcome: Progressing   Problem: Elimination: Goal: Will not experience complications related to bowel motility Outcome: Progressing Goal: Will not experience complications related to urinary retention Outcome: Progressing   Problem: Pain Managment: Goal: General experience of comfort will improve Outcome: Progressing   Problem: Safety: Goal: Ability to remain free from injury will improve Outcome: Progressing   Problem: Skin Integrity: Goal: Risk for impaired skin integrity will decrease Outcome: Progressing   Problem: Education: Goal: Knowledge of [place] [facility] Education information/materials will improve Outcome: Progressing Goal: Emotional status will improve Outcome: Progressing Goal: Mental status will improve Outcome: Progressing Goal: Verbalization of understanding the information provided will improve Outcome: Progressing   Problem: Activity: Goal: Interest or engagement in activities will improve Outcome: Progressing Goal: Sleeping patterns will improve Outcome: Progressing   Problem: Coping: Goal: Ability to verbalize frustrations and anger appropriately will improve Outcome: Progressing Goal: Ability to demonstrate self-control will improve Outcome: Progressing   Problem: Health Behavior/Discharge Planning: Goal: Identification of resources available to assist in meeting health care needs will improve Outcome: Progressing Goal: Compliance with treatment plan for underlying cause of condition will improve Outcome: Progressing   Problem: Physical Regulation: Goal: Ability to maintain clinical measurements within normal limits will improve Outcome: Progressing   Problem: Safety: Goal: Periods of time without injury will increase Outcome: Progressing   Problem: Activity: Goal: Will verbalize the importance of balancing activity with adequate rest  periods Outcome: Progressing   Problem: Education: Goal: Will be free of psychotic symptoms Outcome: Progressing Goal: Knowledge of the prescribed therapeutic regimen will improve Outcome: Progressing   Problem: Coping: Goal: Coping ability will improve Outcome: Progressing Goal: Will verbalize feelings Outcome: Progressing   Problem: Health Behavior/Discharge Planning: Goal: Compliance with prescribed medication regimen will improve Outcome: Progressing  Problem: Nutritional: Goal: Ability to achieve adequate nutritional intake will improve Outcome: Progressing   Problem: Role Relationship: Goal: Ability to communicate needs accurately will improve Outcome: Progressing Goal: Ability to interact with others will improve Outcome: Progressing   Problem: Safety: Goal: Ability to redirect hostility and anger into socially appropriate behaviors will improve Outcome: Progressing Goal: Ability to remain free from injury will improve Outcome: Progressing   Problem: Self-Care: Goal: Ability to participate in self-care as condition permits will improve Outcome: Progressing   Problem: Self-Concept: Goal: Will verbalize positive feelings about self Outcome: Progressing

## 2021-12-20 NOTE — [facility] Suicide Risk Assessment (Addendum)
[facility] INPATIENT:  Family/Significant Other Suicide Prevention Education  Suicide Prevention Education:  Contact Attempts: Michael Chandler, mother, [phone] has been identified by the patient as the family member/significant other with whom the patient will be residing, and identified as the person(s) who will aid the patient in the event of a mental health crisis.  With written consent from the patient, two attempts were made to provide suicide prevention education, prior to and/or following the patient's discharge.  We were unsuccessful in providing suicide prevention education.  A suicide education pamphlet was given to the patient to share with family/significant other.  Date and time of first attempt: 12/18/2021 15:45 PM Date and time of second attempt: 12/20/2021 12:46 PM   CSW team has made two attempt to reach patient's mother. Unable to reach collateral, voicemails left without any return call to date. CSW will remain available to call from mother. No further ation.   Corky Crafts 12/20/2021, 12:46 PM

## 2021-12-20 NOTE — Progress Notes (Signed)
Recreation Therapy Notes  INPATIENT RECREATION TR PLAN  Patient Details Name: Michael Chandler MRN: [id number] DOB: [birth year] Today's Date: 12/20/2021  Rec Therapy Plan Is patient appropriate for Therapeutic Recreation?: Yes Treatment times per week: at least 3 Estimated Length of Stay: 5-7 days TR Treatment/Interventions: Group participation (Comment)  Discharge Criteria Pt will be discharged from therapy if:: Discharged Treatment plan/goals/alternatives discussed and agreed upon by:: Patient/family  Discharge Summary     Heli Dino 12/20/2021, 2:20 PM

## 2021-12-20 NOTE — Progress Notes (Signed)
Recreation Therapy Notes  INPATIENT RECREATION THERAPY ASSESSMENT  Patient Details Name: Michael Chandler MRN: [id number] DOB: 04/22/1986 Today's Date: 12/20/2021       Information Obtained From: Patient  Able to Participate in Assessment/Interview: Yes  Patient Presentation: Responsive  Reason for Admission (Per Patient): Active Symptoms  Patient Stressors: Family  Coping Skills:   Isolation, Avoidance, Meditate, Talk  Leisure Interests (2+):  Games - Video games, Social - Friends  Frequency of Recreation/Participation: Pharmacist, community Resources:  No  Community Resources:     Current Use:    If no, Barriers?:    Expressed Interest in [employer] Information: Yes  [employer] of Residence:  Film/video editor  Patient Main Form of Transportation: Set designer  Patient Strengths:  Talking, thinking  Patient Identified Areas of Improvement:  N/A  Patient Goal for Hospitalization:  Get out to getinto my apartment  Current SI (including self-harm):  No  Current HI:  No  Current AVH: No  Staff Intervention Plan: Group Attendance, Collaborate with Interdisciplinary Treatment Team  Consent to Intern Participation: N/A  Michael Chandler 12/20/2021, 2:18 PM

## 2021-12-21 ENCOUNTER — Other Ambulatory Visit (HOSPITAL_COMMUNITY): Payer: Self-pay

## 2021-12-21 ENCOUNTER — Telehealth (HOSPITAL_COMMUNITY): Payer: Self-pay | Admitting: Pharmacy Technician

## 2021-12-21 DIAGNOSIS — F200 Paranoid schizophrenia: Secondary | ICD-10-CM | POA: Diagnosis not present

## 2021-12-21 LAB — RPR
RPR Ser Ql: REACTIVE — AB
RPR Titer: 1:2 {titer}

## 2021-12-21 MED ORDER — GABAPENTIN 300 MG PO CAPS: 300.0000 mg | ORAL_CAPSULE | Freq: Three times a day (TID) | ORAL | 1 refills | Status: DC

## 2021-12-21 MED ORDER — TRAZODONE HCL 150 MG PO TABS: 150.0000 mg | ORAL_TABLET | Freq: Every evening | ORAL | 1 refills | Status: DC | PRN

## 2021-12-21 MED ORDER — DARUN-COBIC-EMTRICIT-TENOFAF 800-150-200-10 MG PO TABS: 1.0000 | ORAL_TABLET | Freq: Every day | ORAL | 1 refills | Status: DC

## 2021-12-21 MED ORDER — HALOPERIDOL 5 MG PO TABS: 5.0000 mg | ORAL_TABLET | Freq: Two times a day (BID) | ORAL | 1 refills | Status: DC

## 2021-12-21 MED ORDER — BENZTROPINE MESYLATE 1 MG PO TABS
1.0000 mg | ORAL_TABLET | Freq: Every day | ORAL | 1 refills | Status: DC
Start: 2021-12-22 — End: 2022-03-22

## 2021-12-21 NOTE — TOC Benefit Eligibility Note (Signed)
Patient Product/process development scientist completed.    The patient is currently admitted and upon discharge could be taking Symtuza.  The current 30 day co-pay is $4.30.   The patient is insured through [place] Medicare Part D    Roland Earl, CPhT Pharmacy Patient Advocate Specialist [facility] Patient Advocate Team Direct Number: [phone]  Fax: [phone]

## 2021-12-21 NOTE — Discharge Summary (Signed)
Physician Discharge Summary Note  Patient:  Michael Chandler is an 36 y.o., male MRN:  [id number] DOB:  10-03-1985 Patient phone:  [phone] (home)  Patient address:   [address] [place] [address],  Total Time spent with patient: 30 minutes  Date of Admission:  12/17/2021 Date of Discharge: 12/21/2021  Reason for Admission: Patient was admitted after IVC papers filed by his mother reporting dangerous behavior and statements  Principal Problem: Schizophrenia, unspecified (HCC) Discharge Diagnoses: Principal Problem:   Schizophrenia, unspecified (HCC) Active Problems:   Methamphetamine abuse (HCC)   HIV disease (HCC)   Past Psychiatric History: Past history of recurrent psychosis likely diagnosed as schizophrenia exacerbated or possibly induced by methamphetamine abuse  Past Medical History:  Past Medical History:  Diagnosis Date   HIV (human immunodeficiency virus infection) (HCC)    Schizophrenia (HCC)    per IVC paperwork, pt states does not have this dx   History reviewed. No pertinent surgical history. Family History: History reviewed. No pertinent family history. Family Psychiatric  History: None reported Social History:  Social History   Substance and Sexual Activity  Alcohol Use Yes   Comment: occasional     Social History   Substance and Sexual Activity  Drug Use Yes   Types: Methamphetamines, Marijuana, Cocaine   Comment: pt denies at this time    Social History   Socioeconomic History   Marital status: Single    Spouse name: Not on file   Number of children: Not on file   Years of education: Not on file   Highest education level: Not on file  Occupational History   Not on file  Tobacco Use   Smoking status: Every Day   Smokeless tobacco: Never  Substance and Sexual Activity   Alcohol use: Yes    Comment: occasional   Drug use: Yes    Types: Methamphetamines, Marijuana, Cocaine    Comment: pt denies at this time   Sexual activity: Not  on file  Other Topics Concern   Not on file  Social History Narrative   Not on file   Social Determinants of Health   Financial Resource Strain: Not on file  Food Insecurity: Not on file  Transportation Needs: Not on file  Physical Activity: Not on file  Stress: Not on file  Social Connections: Not on file    Hospital Course: Admitted to psychiatric unit.  15-minute checks continued.  Patient initially was somewhat confused and irritable but was never threatening or hostile.  Denied suicidal or homicidal ideation.  Patient was cooperative with ward routine.  He was cooperative with recommended medicine and has been taking haloperidol and Cogentin and gabapentin.  He declined the suggestion of long-acting injectable.  After meeting with infectious disease and review of old chart he was started back on medicine for HIV disease consistent with what he had been prescribed in the past.  At the time of discharge he appears to be lucid and calm with no sign of acute dangerousness and no longer meeting commitment criteria.  Has been strongly advised to discontinue methamphetamine abuse and follow up with recommended outpatient treatment.  He also has an appointment to follow up with the infectious disease specialist.  Concern was raised about possible positive syphilis status and he has been given a shot of penicillin although his RPR titer results are reassuring  Physical Findings: AIMS: Facial and Oral Movements Muscles of Facial Expression: None, normal Lips and Perioral Area: None, normal Jaw: None, normal Tongue:  None, normal,Extremity Movements Upper (arms, wrists, hands, fingers): None, normal Lower (legs, knees, ankles, toes): None, normal, Trunk Movements Neck, shoulders, hips: None, normal, Overall Severity Severity of abnormal movements (highest score from questions above): None, normal Incapacitation due to abnormal movements: None, normal Patient's awareness of abnormal movements  (rate only patient's report): No Awareness, Dental Status Current problems with teeth and/or dentures?: No Does patient usually wear dentures?: No  CIWA:    COWS:     Musculoskeletal: Strength & Muscle Tone: within normal limits Gait & Station: normal Patient leans: N/A   Psychiatric Specialty Exam:  Presentation  General Appearance: Disheveled  Eye Contact:Minimal  Speech:Garbled  Speech Volume:Decreased  Handedness:No data recorded  Mood and Affect  Mood:-- (sleepy)  Affect:Blunt   Thought Process  Thought Processes:Coherent  Descriptions of Associations:Intact  Orientation:Full (Time, Place and Person)  Thought Content:WDL  History of Schizophrenia/Schizoaffective disorder:Yes  Duration of Psychotic Symptoms:Greater than six months  Hallucinations:No data recorded Ideas of Reference:Delusions  Suicidal Thoughts:No data recorded Homicidal Thoughts:No data recorded  Sensorium  Memory:Immediate Poor  Judgment:Fair  Insight:Poor   Executive Functions  Concentration:Poor  Attention Span:Poor  Recall:Poor  Fund of Knowledge:Poor  Language:Poor   Psychomotor Activity  Psychomotor Activity:No data recorded  Assets  Assets:Financial Resources/Insurance; Resilience   Sleep  Sleep:No data recorded   Physical Exam: Physical Exam Vitals and nursing note reviewed.  Constitutional:      Appearance: Normal appearance.  HENT:     Head: Normocephalic and atraumatic.     Mouth/Throat:     Pharynx: Oropharynx is clear.  Eyes:     Pupils: Pupils are equal, round, and reactive to light.  Cardiovascular:     Rate and Rhythm: Normal rate and regular rhythm.  Pulmonary:     Effort: Pulmonary effort is normal.     Breath sounds: Normal breath sounds.  Abdominal:     General: Abdomen is flat.     Palpations: Abdomen is soft.  Musculoskeletal:        General: Normal range of motion.  Skin:    General: Skin is warm and dry.  Neurological:      General: No focal deficit present.     Mental Status: He is alert. Mental status is at baseline.  Psychiatric:        Attention and Perception: Attention normal.        Mood and Affect: Mood normal.        Speech: Speech normal.        Behavior: Behavior normal.        Thought Content: Thought content normal.        Cognition and Memory: Cognition normal.        Judgment: Judgment normal.    Review of Systems  Constitutional: Negative.   HENT: Negative.    Eyes: Negative.   Respiratory: Negative.    Cardiovascular: Negative.   Gastrointestinal: Negative.   Musculoskeletal: Negative.   Skin: Negative.   Neurological: Negative.   Psychiatric/Behavioral: Negative.     Blood pressure 105/80, pulse (!) 112, temperature 97.8 F (36.6 C), temperature source Oral, resp. rate 18, height 6\' 5"  (1.956 m), weight 90.7 kg, SpO2 100 %. Body mass index is 23.72 kg/m.   Social History   Tobacco Use  Smoking Status Every Day  Smokeless Tobacco Never   Tobacco Cessation:  A prescription for an FDA-approved tobacco cessation medication provided at discharge   Blood Alcohol level:  Lab Results  Component Value Date   ETH <  10 12/16/2021   ETH <10 11/15/2021    Metabolic Disorder Labs:  Lab Results  Component Value Date   HGBA1C 6.2 (H) 12/18/2021   MPG 131.24 12/18/2021   No results found for: "PROLACTIN" Lab Results  Component Value Date   CHOL 160 12/18/2021   TRIG 399 (H) 12/18/2021   HDL 30 (L) 12/18/2021   CHOLHDL 5.3 12/18/2021   VLDL 80 (H) 12/18/2021   LDLCALC 50 12/18/2021    See Psychiatric Specialty Exam and Suicide Risk Assessment completed by Attending Physician prior to discharge.  Discharge destination:  Home  Is patient on multiple antipsychotic therapies at discharge:  No   Has Patient had three or more failed trials of antipsychotic monotherapy by history:  No  Recommended Plan for Multiple Antipsychotic Therapies: NA  Discharge Instructions      Diet - low sodium heart healthy   Complete by: As directed    Increase activity slowly   Complete by: As directed       Allergies as of 12/21/2021       Reactions   Bactrim [sulfamethoxazole-trimethoprim] Anaphylaxis        Medication List     STOP taking these medications    famotidine 20 MG tablet Commonly known as: Pepcid   ondansetron 8 MG disintegrating tablet Commonly known as: ZOFRAN-ODT   polyethylene glycol 17 g packet Commonly known as: MiraLax       TAKE these medications      Indication  benztropine 1 MG tablet Commonly known as: COGENTIN Take 1 tablet (1 mg total) by mouth daily. Start taking on: December 22, 2021  Indication: Extrapyramidal Reaction caused by Medications   Darunavir-Cobicistat-Emtricitabine-Tenofovir Alafenamide 800-150-200-10 MG Tabs Commonly known as: SYMTUZA Take 1 tablet by mouth daily with breakfast. Start taking on: December 22, 2021  Indication: HIV Disease   gabapentin 300 MG capsule Commonly known as: NEURONTIN Take 1 capsule (300 mg total) by mouth 3 (three) times daily.  Indication: Generalized Anxiety Disorder   haloperidol 5 MG tablet Commonly known as: HALDOL Take 1 tablet (5 mg total) by mouth 2 (two) times daily.  Indication: Psychosis   traZODone 150 MG tablet Commonly known as: DESYREL Take 1 tablet (150 mg total) by mouth at bedtime as needed for sleep.  Indication: Trouble Sleeping        Follow-up Information     [facility] Infectious Disease Follow up.   Why: Please call [phone] to make an appointment with Dr. Rivka Safer. Contact information: [address] [place] [address] [phone]        [facility]. Go on 02/06/2022.   Specialty: Behavioral Health Why: Please present for scheduled in person appointment Oct 16th at 0830. Please complete new patient walk in packet and bring it to appointment. Contact  information: [address] [facility] [place] [address] [phone]                Follow-up recommendations:  Other:  Follow-up with outpatient mental health treatment and medical treatment and avoid all substance abuse  Comments: Prescriptions provided  Signed: Mordecai Rasmussen, MD 12/21/2021, 10:33 AM

## 2021-12-21 NOTE — [facility] Suicide Risk Assessment (Signed)
[facility] Discharge Suicide Risk Assessment   Principal Problem: Schizophrenia, unspecified (HCC) Discharge Diagnoses: Principal Problem:   Schizophrenia, unspecified (HCC) Active Problems:   Methamphetamine abuse (HCC)   HIV disease (HCC)   Total Time spent with patient: 30 minutes  Musculoskeletal: Strength & Muscle Tone: within normal limits Gait & Station: normal Patient leans: N/A  Psychiatric Specialty Exam  Presentation  General Appearance: Disheveled  Eye Contact:Minimal  Speech:Garbled  Speech Volume:Decreased  Handedness:No data recorded  Mood and Affect  Mood:-- (sleepy)  Duration of Depression Symptoms: Greater than two weeks  Affect:Blunt   Thought Process  Thought Processes:Coherent  Descriptions of Associations:Intact  Orientation:Full (Time, Place and Person)  Thought Content:WDL  History of Schizophrenia/Schizoaffective disorder:Yes  Duration of Psychotic Symptoms:Greater than six months  Hallucinations:No data recorded Ideas of Reference:Delusions  Suicidal Thoughts:No data recorded Homicidal Thoughts:No data recorded  Sensorium  Memory:Immediate Poor  Judgment:Fair  Insight:Poor   Executive Functions  Concentration:Poor  Attention Span:Poor  Recall:Poor  Fund of Knowledge:Poor  Language:Poor   Psychomotor Activity  Psychomotor Activity:No data recorded  Assets  Assets:Financial Resources/Insurance; Resilience   Sleep  Sleep:No data recorded  Physical Exam: Physical Exam Vitals and nursing note reviewed.  Constitutional:      Appearance: Normal appearance.  HENT:     Head: Normocephalic and atraumatic.     Mouth/Throat:     Pharynx: Oropharynx is clear.  Eyes:     Pupils: Pupils are equal, round, and reactive to light.  Cardiovascular:     Rate and Rhythm: Normal rate and regular rhythm.  Pulmonary:     Effort: Pulmonary effort is normal.     Breath sounds: Normal breath sounds.  Abdominal:      General: Abdomen is flat.     Palpations: Abdomen is soft.  Musculoskeletal:        General: Normal range of motion.  Skin:    General: Skin is warm and dry.  Neurological:     General: No focal deficit present.     Mental Status: He is alert. Mental status is at baseline.  Psychiatric:        Attention and Perception: Attention normal.        Mood and Affect: Mood normal.        Speech: Speech normal.        Behavior: Behavior is cooperative.        Thought Content: Thought content normal.    Review of Systems  Constitutional: Negative.   HENT: Negative.    Eyes: Negative.   Respiratory: Negative.    Cardiovascular: Negative.   Gastrointestinal: Negative.   Musculoskeletal: Negative.   Skin: Negative.   Neurological: Negative.   Psychiatric/Behavioral: Negative.     Blood pressure 105/80, pulse (!) 112, temperature 97.8 F (36.6 C), temperature source Oral, resp. rate 18, height 6\' 5"  (1.956 m), weight 90.7 kg, SpO2 100 %. Body mass index is 23.72 kg/m.  Mental Status Per Nursing Assessment::   On Admission:  NA  Demographic Factors:  Male, Low socioeconomic status, and Living alone  Loss Factors: NA  Historical Factors: Impulsivity  Risk Reduction Factors:   Positive coping skills or problem solving skills  Continued Clinical Symptoms:  Alcohol/Substance Abuse/Dependencies Schizophrenia:   Less than 52 years old  Cognitive Features That Contribute To Risk:  None    Suicide Risk:  Minimal: No identifiable suicidal ideation.  Patients presenting with no risk factors but with morbid ruminations; may be classified as minimal risk based on the severity  of the depressive symptoms    Plan Of Care/Follow-up recommendations:  Patient will be given recommendation to follow-up with local mental health agency for psychiatric treatment as well as follow-up with infectious disease clinic.  Prescriptions provided.  Patient denies any suicidal ideation and has not  displayed any suicidal or dangerous behavior on the unit.  Mordecai Rasmussen, MD 12/21/2021, 10:15 AM

## 2021-12-21 NOTE — Plan of Care (Signed)
  Problem: Group Participation Goal: STG - Patient will engage in groups without prompting or encouragement from LRT x3 group sessions within 5 recreation therapy group sessions Description: STG - Patient will engage in groups without prompting or encouragement from LRT x3 group sessions within 5 recreation therapy group sessions 12/21/2021 1256 by Alveria Apley, LRT Outcome: Adequate for Discharge 12/21/2021 1255 by Alveria Apley, LRT Outcome: Adequate for Discharge

## 2021-12-21 NOTE — Progress Notes (Signed)
Recreation Therapy Notes  INPATIENT RECREATION TR PLAN  Patient Details Name: Michael Chandler MRN: [id number] DOB: 12/15/1985 Today's Date: 12/21/2021  Rec Therapy Plan Is patient appropriate for Therapeutic Recreation?: Yes Treatment times per week: at least 3 Estimated Length of Stay: 5-7 days TR Treatment/Interventions: Group participation (Comment)  Discharge Criteria Pt will be discharged from therapy if:: Discharged Treatment plan/goals/alternatives discussed and agreed upon by:: Patient/family  Discharge Summary Short term goals set: Patient will engage in groups without prompting or encouragement from LRT x3 group sessions within 5 recreation therapy group sessions Short term goals met: Adequate for discharge Progress toward goals comments: Groups attended Which groups?: Social skills Reason goals not met: N/A Therapeutic equipment acquired: N/A Reason patient discharged from therapy: Discharge from hospital Pt/family agrees with progress & goals achieved: Yes Date patient discharged from therapy: 12/21/21   Benji Poynter 12/21/2021, 12:57 PM

## 2021-12-21 NOTE — Progress Notes (Signed)
[facility] Adult Case Management Discharge Plan :  Will you be returning to the same living situation after discharge:  Yes,  Patient to return to mother's home.  At discharge, do you have transportation home?: Yes,  Patient's mother to provide transportation from hospital.  Do you have the ability to pay for your medications: Yes,  Medicare A and B  Release of information consent forms completed and in the chart;  Patient's signature needed at discharge.  Patient to Follow up at:  Follow-up Information     [facility] Infectious Disease Follow up.   Why: Please call [phone] to make an appointment with Dr. Rivka Safer. Contact information: [address] [place] [address] [phone]        [facility]. Go on 02/06/2022.   Specialty: Behavioral Health Why: Please present for scheduled in person appointment Oct 16th at 0830. Please complete new patient walk in packet and bring it to appointment. Contact information: [address] [facility] [place] [address] [phone]                Next level of care provider has access to [facility] Link:yes  Safety Planning and Suicide Prevention discussed: Yes,  SPE completed with patient and  Shourya Macpherson, mother, [phone]   Has patient been referred to the Quitline?: Patient refused referral Tobacco Use: High Risk (12/17/2021)   Patient History    Smoking Tobacco Use: Every Day    Smokeless Tobacco Use: Never    Passive Exposure: Not on file   Patient has been referred for addiction treatment: Pt. refused referral Social History   Substance and Sexual Activity  Drug Use Yes   Types: Methamphetamines, Marijuana, Cocaine   Comment: pt denies at this time   Social History   Substance and Sexual Activity  Alcohol Use Yes   Comment: occasional   Corky Crafts, LCSWA 12/21/2021, 10:25 AM

## 2021-12-21 NOTE — [facility] Suicide Risk Assessment (Signed)
[facility] INPATIENT:  Family/Significant Other Suicide Prevention Education  Suicide Prevention Education:  Education Completed; Avon Mergenthaler, mother, [phone] has been identified by the patient as the family member/significant other with whom the patient will be residing, and identified as the person(s) who will aid the patient in the event of a mental health crisis (suicidal ideations/suicide attempt).  With written consent from the patient, the family member/significant other has been provided the following suicide prevention education, prior to the and/or following the discharge of the patient.  In addition to the precautions below, patient's support agrees to monitor patient for safety, ensure treatment compliance, and alert emergency services should patient require such services.   The suicide prevention education provided includes the following: Suicide risk factors Suicide prevention and interventions National Suicide Hotline telephone number [facility] assessment telephone number [facility] Emergency Assistance 911 [facility] and/or Residential Mobile Crisis Unit telephone number  Request made of family/significant other to: Remove weapons (e.g., guns, rifles, knives), all items previously/currently identified as safety concern.   Remove drugs/medications (over-the-counter, prescriptions, illicit drugs), all items previously/currently identified as a safety concern.  The family member/significant other verbalizes understanding of the suicide prevention education information provided.  The family member/significant other agrees to remove the items of safety concern listed above.  Corky Crafts 12/21/2021, 10:27 AM

## 2021-12-21 NOTE — Progress Notes (Signed)
Recreation Therapy Notes   Date: 12/21/2021   Time: 11:05 am     Location: Courtyard   Behavioral response: Appropriate  Intervention Topic:  Social Skills  Discussion/Intervention:  Group content on today was focused on social skills. The group defined social skills and identified ways they use social skills. Patients expressed what obstacles they face when trying to be social. Participants described the importance of social skills. The group listed ways to improve social skills and reasons to improve social skills. Individuals had an opportunity to learn new and improve social skills as well as identify their weaknesses. Clinical Observations/Feedback: Patient came to group and identified reasons socializing are important and who they normally socialize with. Individual was social with peers and staff while participating in the intervention.   Mckinzey Entwistle LRT/CTRS        Keith Felten 12/21/2021 12:54 PM

## 2021-12-21 NOTE — Telephone Encounter (Signed)
Pharmacy Patient Advocate Encounter  Insurance verification completed.    The patient is insured through [employer] Part D   The patient is currently admitted and ran test claims for the following: [employer].  Copays and coinsurance results were relayed to Inpatient clinical team.

## 2021-12-21 NOTE — Care Management Important Message (Signed)
Important Message  Patient Details  Name: Michael Chandler MRN: [id number] Date of Birth: October 07, 1985   Medicare Important Message Given:  Yes  Patient informed of right to appeal discharge, provided phone number to [facility]. Patient expressed no interest in appealing discharge at this time. CSW will continue to monitor situation.   Corky Crafts, LCSWA 12/21/2021, 10:28 AM

## 2021-12-21 NOTE — Progress Notes (Signed)
Discharge note: RN met with pt and reviewed pt's discharge instructions. Pt verbalized understanding of discharge instructions and pt did not have any questions. RN reviewed and provided pt with a copy of SRA, AVS and Transition Record. RN returned pt's belongings to pt.  Prescriptions were given to pt. Pt denied SI/HI/AVH and voiced no concerns. Pt was appreciative of the care pt received at [facility]. Patient discharged to the lobby without incident.   Problem: Education: Goal: Knowledge of General Education information will improve Description: Including pain rating scale, medication(s)/side effects and non-pharmacologic comfort measures Outcome: Progressing   Problem: Health Behavior/Discharge Planning: Goal: Ability to manage health-related needs will improve Outcome: Progressing   Problem: Clinical Measurements: Goal: Ability to maintain clinical measurements within normal limits will improve Outcome: Progressing   Problem: Coping: Goal: Level of anxiety will decrease Outcome: Progressing    12/21/21 0816  Psych Admission Type (Psych Patients Only)  Admission Status Involuntary  Psychosocial Assessment  Patient Complaints None  Eye Contact Fair  Facial Expression Other (Comment) (WDL)  Affect Appropriate to circumstance  Speech Logical/coherent  Interaction Assertive  Motor Activity Other (Comment) (WDL)  Appearance/Hygiene Unremarkable;In scrubs  Behavior Characteristics Cooperative;Appropriate to situation;Calm  Mood Pleasant  Thought Process  Coherency Circumstantial  Content WDL  Delusions None reported or observed  Perception WDL  Hallucination None reported or observed  Judgment WDL  Confusion None  Danger to Self  Current suicidal ideation? Denies  Danger to Others  Danger to Others None reported or observed

## 2021-12-22 LAB — T.PALLIDUM AB, TOTAL: T Pallidum Abs: REACTIVE — AB

## 2021-12-27 ENCOUNTER — Inpatient Hospital Stay: Payer: Medicare Other | Admitting: Infectious Diseases

## 2022-01-03 ENCOUNTER — Inpatient Hospital Stay: Payer: Medicare Other | Admitting: Infectious Diseases

## 2022-01-14 DIAGNOSIS — M25511 Pain in right shoulder: Secondary | ICD-10-CM | POA: Diagnosis not present

## 2022-01-14 DIAGNOSIS — Z5900 Homelessness unspecified: Secondary | ICD-10-CM | POA: Diagnosis not present

## 2022-01-15 DIAGNOSIS — Z5900 Homelessness unspecified: Secondary | ICD-10-CM | POA: Diagnosis not present

## 2022-01-15 DIAGNOSIS — F32A Depression, unspecified: Secondary | ICD-10-CM | POA: Diagnosis not present

## 2022-01-15 DIAGNOSIS — M25511 Pain in right shoulder: Secondary | ICD-10-CM | POA: Diagnosis not present

## 2022-01-15 DIAGNOSIS — I10 Essential (primary) hypertension: Secondary | ICD-10-CM | POA: Diagnosis not present

## 2022-01-15 DIAGNOSIS — Z5982 Transportation insecurity: Secondary | ICD-10-CM | POA: Diagnosis not present

## 2022-01-15 DIAGNOSIS — R45851 Suicidal ideations: Secondary | ICD-10-CM | POA: Diagnosis not present

## 2022-01-15 DIAGNOSIS — Z20822 Contact with and (suspected) exposure to covid-19: Secondary | ICD-10-CM | POA: Diagnosis not present

## 2022-01-15 DIAGNOSIS — Z28310 Unvaccinated for covid-19: Secondary | ICD-10-CM | POA: Diagnosis not present

## 2022-01-26 DIAGNOSIS — Z28310 Unvaccinated for covid-19: Secondary | ICD-10-CM | POA: Diagnosis not present

## 2022-01-26 DIAGNOSIS — H538 Other visual disturbances: Secondary | ICD-10-CM | POA: Diagnosis not present

## 2022-01-26 DIAGNOSIS — R918 Other nonspecific abnormal finding of lung field: Secondary | ICD-10-CM | POA: Diagnosis not present

## 2022-01-26 DIAGNOSIS — B370 Candidal stomatitis: Secondary | ICD-10-CM | POA: Diagnosis not present

## 2022-01-26 DIAGNOSIS — R079 Chest pain, unspecified: Secondary | ICD-10-CM | POA: Diagnosis not present

## 2022-01-26 DIAGNOSIS — B20 Human immunodeficiency virus [HIV] disease: Secondary | ICD-10-CM | POA: Diagnosis not present

## 2022-01-26 DIAGNOSIS — F17210 Nicotine dependence, cigarettes, uncomplicated: Secondary | ICD-10-CM | POA: Diagnosis not present

## 2022-01-26 DIAGNOSIS — R109 Unspecified abdominal pain: Secondary | ICD-10-CM | POA: Diagnosis not present

## 2022-01-26 DIAGNOSIS — Z21 Asymptomatic human immunodeficiency virus [HIV] infection status: Secondary | ICD-10-CM | POA: Diagnosis not present

## 2022-01-26 DIAGNOSIS — M545 Low back pain, unspecified: Secondary | ICD-10-CM | POA: Diagnosis not present

## 2022-01-28 DIAGNOSIS — R079 Chest pain, unspecified: Secondary | ICD-10-CM | POA: Diagnosis not present

## 2022-01-28 DIAGNOSIS — R0602 Shortness of breath: Secondary | ICD-10-CM | POA: Diagnosis not present

## 2022-01-30 DIAGNOSIS — Z20822 Contact with and (suspected) exposure to covid-19: Secondary | ICD-10-CM | POA: Diagnosis not present

## 2022-01-30 DIAGNOSIS — R059 Cough, unspecified: Secondary | ICD-10-CM | POA: Diagnosis not present

## 2022-01-30 DIAGNOSIS — B349 Viral infection, unspecified: Secondary | ICD-10-CM | POA: Diagnosis not present

## 2022-01-30 DIAGNOSIS — F32A Depression, unspecified: Secondary | ICD-10-CM | POA: Diagnosis not present

## 2022-02-05 DIAGNOSIS — J9811 Atelectasis: Secondary | ICD-10-CM | POA: Diagnosis not present

## 2022-02-05 DIAGNOSIS — F32A Depression, unspecified: Secondary | ICD-10-CM | POA: Diagnosis not present

## 2022-02-05 DIAGNOSIS — F324 Major depressive disorder, single episode, in partial remission: Secondary | ICD-10-CM | POA: Diagnosis not present

## 2022-02-05 DIAGNOSIS — R079 Chest pain, unspecified: Secondary | ICD-10-CM | POA: Diagnosis not present

## 2022-02-06 ENCOUNTER — Ambulatory Visit: Payer: Medicare Other | Admitting: Psychiatry

## 2022-02-14 DIAGNOSIS — H538 Other visual disturbances: Secondary | ICD-10-CM | POA: Diagnosis not present

## 2022-02-14 DIAGNOSIS — R112 Nausea with vomiting, unspecified: Secondary | ICD-10-CM | POA: Diagnosis not present

## 2022-02-14 DIAGNOSIS — R0602 Shortness of breath: Secondary | ICD-10-CM | POA: Diagnosis not present

## 2022-02-14 DIAGNOSIS — R509 Fever, unspecified: Secondary | ICD-10-CM | POA: Diagnosis not present

## 2022-02-14 DIAGNOSIS — R918 Other nonspecific abnormal finding of lung field: Secondary | ICD-10-CM | POA: Diagnosis not present

## 2022-02-14 DIAGNOSIS — B3781 Candidal esophagitis: Secondary | ICD-10-CM | POA: Diagnosis not present

## 2022-02-14 DIAGNOSIS — F1990 Other psychoactive substance use, unspecified, uncomplicated: Secondary | ICD-10-CM | POA: Diagnosis not present

## 2022-02-14 DIAGNOSIS — R531 Weakness: Secondary | ICD-10-CM | POA: Diagnosis not present

## 2022-02-14 DIAGNOSIS — R51 Headache with orthostatic component, not elsewhere classified: Secondary | ICD-10-CM | POA: Diagnosis not present

## 2022-02-14 DIAGNOSIS — R197 Diarrhea, unspecified: Secondary | ICD-10-CM | POA: Diagnosis not present

## 2022-02-14 DIAGNOSIS — J189 Pneumonia, unspecified organism: Secondary | ICD-10-CM | POA: Diagnosis not present

## 2022-02-14 DIAGNOSIS — R000 Tachycardia, unspecified: Secondary | ICD-10-CM | POA: Diagnosis not present

## 2022-02-14 DIAGNOSIS — B20 Human immunodeficiency virus [HIV] disease: Secondary | ICD-10-CM | POA: Diagnosis not present

## 2022-02-14 DIAGNOSIS — Z981 Arthrodesis status: Secondary | ICD-10-CM | POA: Diagnosis not present

## 2022-02-15 DIAGNOSIS — R197 Diarrhea, unspecified: Secondary | ICD-10-CM | POA: Diagnosis not present

## 2022-02-15 DIAGNOSIS — D638 Anemia in other chronic diseases classified elsewhere: Secondary | ICD-10-CM | POA: Diagnosis not present

## 2022-02-15 DIAGNOSIS — F419 Anxiety disorder, unspecified: Secondary | ICD-10-CM | POA: Diagnosis not present

## 2022-02-15 DIAGNOSIS — R51 Headache with orthostatic component, not elsewhere classified: Secondary | ICD-10-CM | POA: Diagnosis not present

## 2022-02-15 DIAGNOSIS — J189 Pneumonia, unspecified organism: Secondary | ICD-10-CM | POA: Diagnosis not present

## 2022-02-15 DIAGNOSIS — R112 Nausea with vomiting, unspecified: Secondary | ICD-10-CM | POA: Diagnosis not present

## 2022-02-15 DIAGNOSIS — B970 Adenovirus as the cause of diseases classified elsewhere: Secondary | ICD-10-CM | POA: Diagnosis not present

## 2022-02-15 DIAGNOSIS — H538 Other visual disturbances: Secondary | ICD-10-CM | POA: Diagnosis not present

## 2022-02-15 DIAGNOSIS — A072 Cryptosporidiosis: Secondary | ICD-10-CM | POA: Diagnosis not present

## 2022-02-15 DIAGNOSIS — Z21 Asymptomatic human immunodeficiency virus [HIV] infection status: Secondary | ICD-10-CM | POA: Diagnosis not present

## 2022-02-15 DIAGNOSIS — B3781 Candidal esophagitis: Secondary | ICD-10-CM | POA: Diagnosis not present

## 2022-02-15 DIAGNOSIS — E871 Hypo-osmolality and hyponatremia: Secondary | ICD-10-CM | POA: Diagnosis not present

## 2022-02-15 DIAGNOSIS — F1910 Other psychoactive substance abuse, uncomplicated: Secondary | ICD-10-CM | POA: Diagnosis not present

## 2022-02-15 DIAGNOSIS — R519 Headache, unspecified: Secondary | ICD-10-CM | POA: Diagnosis not present

## 2022-02-15 DIAGNOSIS — F1990 Other psychoactive substance use, unspecified, uncomplicated: Secondary | ICD-10-CM | POA: Diagnosis not present

## 2022-02-15 DIAGNOSIS — F209 Schizophrenia, unspecified: Secondary | ICD-10-CM | POA: Diagnosis not present

## 2022-02-15 DIAGNOSIS — B20 Human immunodeficiency virus [HIV] disease: Secondary | ICD-10-CM | POA: Diagnosis not present

## 2022-02-15 DIAGNOSIS — Z792 Long term (current) use of antibiotics: Secondary | ICD-10-CM | POA: Diagnosis not present

## 2022-02-15 DIAGNOSIS — R000 Tachycardia, unspecified: Secondary | ICD-10-CM | POA: Diagnosis not present

## 2022-02-15 DIAGNOSIS — J129 Viral pneumonia, unspecified: Secondary | ICD-10-CM | POA: Diagnosis not present

## 2022-02-15 DIAGNOSIS — B9629 Other Escherichia coli [E. coli] as the cause of diseases classified elsewhere: Secondary | ICD-10-CM | POA: Diagnosis not present

## 2022-02-15 DIAGNOSIS — R0602 Shortness of breath: Secondary | ICD-10-CM | POA: Diagnosis not present

## 2022-02-15 DIAGNOSIS — Z91148 Patient's other noncompliance with medication regimen for other reason: Secondary | ICD-10-CM | POA: Diagnosis not present

## 2022-02-15 DIAGNOSIS — A419 Sepsis, unspecified organism: Secondary | ICD-10-CM | POA: Diagnosis not present

## 2022-02-15 DIAGNOSIS — R9431 Abnormal electrocardiogram [ECG] [EKG]: Secondary | ICD-10-CM | POA: Diagnosis not present

## 2022-02-15 DIAGNOSIS — J45909 Unspecified asthma, uncomplicated: Secondary | ICD-10-CM | POA: Diagnosis not present

## 2022-02-15 DIAGNOSIS — R918 Other nonspecific abnormal finding of lung field: Secondary | ICD-10-CM | POA: Diagnosis not present

## 2022-02-16 DIAGNOSIS — Z792 Long term (current) use of antibiotics: Secondary | ICD-10-CM | POA: Diagnosis not present

## 2022-02-16 DIAGNOSIS — B3781 Candidal esophagitis: Secondary | ICD-10-CM | POA: Diagnosis not present

## 2022-02-16 DIAGNOSIS — F1910 Other psychoactive substance abuse, uncomplicated: Secondary | ICD-10-CM | POA: Diagnosis not present

## 2022-02-16 DIAGNOSIS — A072 Cryptosporidiosis: Secondary | ICD-10-CM | POA: Diagnosis not present

## 2022-02-16 DIAGNOSIS — D649 Anemia, unspecified: Secondary | ICD-10-CM | POA: Diagnosis not present

## 2022-02-16 DIAGNOSIS — B20 Human immunodeficiency virus [HIV] disease: Secondary | ICD-10-CM | POA: Diagnosis not present

## 2022-02-16 DIAGNOSIS — R197 Diarrhea, unspecified: Secondary | ICD-10-CM | POA: Diagnosis not present

## 2022-02-16 DIAGNOSIS — A419 Sepsis, unspecified organism: Secondary | ICD-10-CM | POA: Diagnosis not present

## 2022-02-16 DIAGNOSIS — J189 Pneumonia, unspecified organism: Secondary | ICD-10-CM | POA: Diagnosis not present

## 2022-02-16 DIAGNOSIS — B970 Adenovirus as the cause of diseases classified elsewhere: Secondary | ICD-10-CM | POA: Diagnosis not present

## 2022-02-16 DIAGNOSIS — E871 Hypo-osmolality and hyponatremia: Secondary | ICD-10-CM | POA: Diagnosis not present

## 2022-02-16 DIAGNOSIS — B9629 Other Escherichia coli [E. coli] as the cause of diseases classified elsewhere: Secondary | ICD-10-CM | POA: Diagnosis not present

## 2022-02-17 ENCOUNTER — Telehealth: Payer: Self-pay

## 2022-02-17 DIAGNOSIS — E871 Hypo-osmolality and hyponatremia: Secondary | ICD-10-CM | POA: Diagnosis not present

## 2022-02-17 DIAGNOSIS — D649 Anemia, unspecified: Secondary | ICD-10-CM | POA: Diagnosis not present

## 2022-02-17 DIAGNOSIS — R197 Diarrhea, unspecified: Secondary | ICD-10-CM | POA: Diagnosis not present

## 2022-02-17 DIAGNOSIS — B970 Adenovirus as the cause of diseases classified elsewhere: Secondary | ICD-10-CM | POA: Diagnosis not present

## 2022-02-17 DIAGNOSIS — J189 Pneumonia, unspecified organism: Secondary | ICD-10-CM | POA: Diagnosis not present

## 2022-02-17 DIAGNOSIS — B3781 Candidal esophagitis: Secondary | ICD-10-CM | POA: Diagnosis not present

## 2022-02-17 DIAGNOSIS — A539 Syphilis, unspecified: Secondary | ICD-10-CM | POA: Diagnosis not present

## 2022-02-17 DIAGNOSIS — F1910 Other psychoactive substance abuse, uncomplicated: Secondary | ICD-10-CM | POA: Diagnosis not present

## 2022-02-17 DIAGNOSIS — A072 Cryptosporidiosis: Secondary | ICD-10-CM | POA: Diagnosis not present

## 2022-02-17 DIAGNOSIS — B9629 Other Escherichia coli [E. coli] as the cause of diseases classified elsewhere: Secondary | ICD-10-CM | POA: Diagnosis not present

## 2022-02-17 DIAGNOSIS — Z792 Long term (current) use of antibiotics: Secondary | ICD-10-CM | POA: Diagnosis not present

## 2022-02-17 DIAGNOSIS — B20 Human immunodeficiency virus [HIV] disease: Secondary | ICD-10-CM | POA: Diagnosis not present

## 2022-02-17 NOTE — Telephone Encounter (Signed)
Patient requesting to transfer from [facility] to [facility]. He left vm and I attempted to call back but was unable to reach.

## 2022-02-19 DIAGNOSIS — J189 Pneumonia, unspecified organism: Secondary | ICD-10-CM | POA: Diagnosis not present

## 2022-02-20 DIAGNOSIS — B20 Human immunodeficiency virus [HIV] disease: Secondary | ICD-10-CM | POA: Diagnosis not present

## 2022-02-20 DIAGNOSIS — B3781 Candidal esophagitis: Secondary | ICD-10-CM | POA: Diagnosis not present

## 2022-02-20 DIAGNOSIS — J129 Viral pneumonia, unspecified: Secondary | ICD-10-CM | POA: Diagnosis not present

## 2022-02-20 NOTE — Telephone Encounter (Signed)
I attempted to contact the patient to follow up with him to get him scheduled. Patient did not answer. Call unable to be completed.  Michael Chandler

## 2022-02-23 NOTE — Telephone Encounter (Signed)
Attempted to contact patient who wishes to be seen with [facility] for HIV management. Attempted to contact pharmacy for possible alternat phone number, but they also had same phone number on file.   Eugenia Mcalpine, LPN

## 2022-02-28 ENCOUNTER — Emergency Department (HOSPITAL_COMMUNITY)
Admission: EM | Admit: 2022-02-28 | Discharge: 2022-03-01 | Disposition: A | Discharge status: Other Acute Inpatient Hospital | Payer: No Typology Code available for payment source | Attending: Emergency Medicine | Admitting: Emergency Medicine

## 2022-02-28 ENCOUNTER — Encounter (HOSPITAL_COMMUNITY): Payer: Self-pay

## 2022-02-28 ENCOUNTER — Other Ambulatory Visit: Payer: Self-pay

## 2022-02-28 DIAGNOSIS — F332 Major depressive disorder, recurrent severe without psychotic features: Secondary | ICD-10-CM | POA: Diagnosis not present

## 2022-02-28 DIAGNOSIS — F209 Schizophrenia, unspecified: Secondary | ICD-10-CM | POA: Diagnosis not present

## 2022-02-28 DIAGNOSIS — R45851 Suicidal ideations: Secondary | ICD-10-CM

## 2022-02-28 DIAGNOSIS — Z79899 Other long term (current) drug therapy: Secondary | ICD-10-CM | POA: Insufficient documentation

## 2022-02-28 DIAGNOSIS — Z21 Asymptomatic human immunodeficiency virus [HIV] infection status: Secondary | ICD-10-CM | POA: Insufficient documentation

## 2022-02-28 DIAGNOSIS — Z20822 Contact with and (suspected) exposure to covid-19: Secondary | ICD-10-CM | POA: Insufficient documentation

## 2022-02-28 DIAGNOSIS — R410 Disorientation, unspecified: Secondary | ICD-10-CM | POA: Diagnosis not present

## 2022-02-28 DIAGNOSIS — J189 Pneumonia, unspecified organism: Secondary | ICD-10-CM | POA: Diagnosis not present

## 2022-02-28 DIAGNOSIS — I10 Essential (primary) hypertension: Secondary | ICD-10-CM | POA: Diagnosis not present

## 2022-02-28 LAB — CBC WITH DIFFERENTIAL/PLATELET
Abs Immature Granulocytes: 0.01 10*3/uL (ref 0.00–0.07)
Basophils Absolute: 0 10*3/uL (ref 0.0–0.1)
Basophils Relative: 0 %
Eosinophils Absolute: 0 10*3/uL (ref 0.0–0.5)
Eosinophils Relative: 0 %
HCT: 29.5 % — ABNORMAL LOW (ref 39.0–52.0)
Hemoglobin: 9.2 g/dL — ABNORMAL LOW (ref 13.0–17.0)
Immature Granulocytes: 0 %
Lymphocytes Relative: 45 %
Lymphs Abs: 1.8 10*3/uL (ref 0.7–4.0)
MCH: 25.9 pg — ABNORMAL LOW (ref 26.0–34.0)
MCHC: 31.2 g/dL (ref 30.0–36.0)
MCV: 83.1 fL (ref 80.0–100.0)
Monocytes Absolute: 1.1 10*3/uL — ABNORMAL HIGH (ref 0.1–1.0)
Monocytes Relative: 28 %
Neutro Abs: 1.1 10*3/uL — ABNORMAL LOW (ref 1.7–7.7)
Neutrophils Relative %: 27 %
Platelets: 318 10*3/uL (ref 150–400)
RBC: 3.55 MIL/uL — ABNORMAL LOW (ref 4.22–5.81)
RDW: 14.9 % (ref 11.5–15.5)
WBC: 4 10*3/uL (ref 4.0–10.5)
nRBC: 0 % (ref 0.0–0.2)

## 2022-02-28 LAB — BASIC METABOLIC PANEL
Anion gap: 12 (ref 5–15)
BUN: 23 mg/dL — ABNORMAL HIGH (ref 6–20)
CO2: 24 mmol/L (ref 22–32)
Calcium: 8.5 mg/dL — ABNORMAL LOW (ref 8.9–10.3)
Chloride: 94 mmol/L — ABNORMAL LOW (ref 98–111)
Creatinine, Ser: 1.3 mg/dL — ABNORMAL HIGH (ref 0.61–1.24)
GFR, Estimated: >60 mL/min (ref >60)
Glucose, Bld: 80 mg/dL (ref 70–99)
Potassium: 3.7 mmol/L (ref 3.5–5.1)
Sodium: 130 mmol/L — ABNORMAL LOW (ref 135–145)

## 2022-02-28 LAB — ETHANOL: Alcohol, Ethyl (B): <10 mg/dL (ref <10)

## 2022-02-28 LAB — SARS CORONAVIRUS 2 BY RT PCR: SARS Coronavirus 2 by RT PCR: NEGATIVE

## 2022-02-28 MED ORDER — GABAPENTIN 300 MG PO CAPS
300.0000 mg | ORAL_CAPSULE | Freq: Three times a day (TID) | ORAL | Status: DC
  Administered 2022-02-28 – 2022-03-01 (×3): 300 mg via ORAL
  Filled 2022-02-28 (×3): qty 1

## 2022-02-28 MED ORDER — BENZTROPINE MESYLATE 0.5 MG PO TABS
1.0000 mg | ORAL_TABLET | Freq: Every day | ORAL | Status: DC
  Administered 2022-02-28 – 2022-03-01 (×2): 1 mg via ORAL
  Filled 2022-02-28 (×2): qty 2

## 2022-02-28 MED ORDER — TRAZODONE HCL 100 MG PO TABS
150.0000 mg | ORAL_TABLET | Freq: Every evening | ORAL | Status: DC | PRN
  Administered 2022-02-28: 150 mg via ORAL
  Filled 2022-02-28: qty 2

## 2022-02-28 MED ORDER — HALOPERIDOL 5 MG PO TABS
5.0000 mg | ORAL_TABLET | Freq: Two times a day (BID) | ORAL | Status: DC
  Administered 2022-02-28 – 2022-03-01 (×3): 5 mg via ORAL
  Filled 2022-02-28 (×3): qty 1

## 2022-02-28 MED ORDER — LACTATED RINGERS IV BOLUS: 1000.0000 mL | Freq: Once | INTRAVENOUS | Status: DC

## 2022-02-28 MED ORDER — DARUN-COBIC-EMTRICIT-TENOFAF 800-150-200-10 MG PO TABS
1.0000 | ORAL_TABLET | Freq: Every day | ORAL | Status: DC
  Administered 2022-03-01: 1 via ORAL
  Filled 2022-02-28 (×3): qty 1

## 2022-02-28 NOTE — Consult Note (Signed)
Attempt to evaluate patient failed as he did not want to engage.  He got up used the bathroom and acame back to the stretcher backing provider and covered self .  Patient would not speak or answer much question.

## 2022-02-28 NOTE — ED Triage Notes (Signed)
Pt to er via ems, per ems pt is here for si, per ems pt just flew in from [place].  Pt states that he is here because "I am ready to die".

## 2022-02-28 NOTE — ED Notes (Signed)
Pt changed into burgundy scrubs. Pt has 2 personal belonging's bag and a dark gray suitcase, all labeled and placed under cabinets: 16-18. Pt items include: clothing, his phone, and ID. Security wanded pt.

## 2022-02-28 NOTE — ED Notes (Signed)
ED Provider at bedside. 

## 2022-02-28 NOTE — ED Provider Notes (Signed)
[facility] DEPT Provider Note   CSN: [id number] Arrival date & time: 02/28/22  1106     History  Chief Complaint  Patient presents with   Suicidal    Michael Chandler is a 36 y.o. male.  Patient is a 36 year old male with a history of HIV and schizophrenia who is presenting today reporting that he is suicidal.  He states he does not want to live anymore and he just wants to die.  When asking how he would kill himself he reports he would try to break his neck or suffocate himself.  He denies trying to hurt himself in the past.  Patient is currently staying in a hotel because he is waiting for his check so he can have more stable housing.  He denies alcohol or drug use.  He reports he has not been taking his medication and last had it when he was hospitalized which appears to be in the end of August when he was in behavioral health.  Patient does report yesterday he started having nasal congestion but denies cough, shortness of breath or chest pain.  He denies the desire to want to hurt anybody else and when asked if he is having any visual or auditory hallucinations he denies but reports that he does have the power of telepathy.  The history is provided by the patient and medical records.       Home Medications Prior to Admission medications   Medication Sig Start Date End Date Taking? Authorizing Provider  dapsone 100 MG tablet Take 100 mg by mouth daily. 02/20/22  Yes [provider]  benztropine (COGENTIN) 1 MG tablet Take 1 tablet (1 mg total) by mouth daily. 12/22/21   Clapacs, Madie Reno, MD  Darunavir-Cobicistat-Emtricitabine-Tenofovir Alafenamide [facility]) 800-150-200-10 MG TABS Take 1 tablet by mouth daily with breakfast. 12/22/21   Clapacs, Madie Reno, MD  gabapentin (NEURONTIN) 300 MG capsule Take 1 capsule (300 mg total) by mouth 3 (three) times daily. 12/21/21   Clapacs, Madie Reno, MD  haloperidol (HALDOL) 5 MG tablet Take 1 tablet (5 mg total) by  mouth 2 (two) times daily. 12/21/21   Clapacs, Madie Reno, MD  traZODone (DESYREL) 150 MG tablet Take 1 tablet (150 mg total) by mouth at bedtime as needed for sleep. 12/21/21   Clapacs, Madie Reno, MD      Allergies    Bactrim [sulfamethoxazole-trimethoprim]    Review of Systems   Review of Systems  Physical Exam Updated Vital Signs BP 95/62 (BP Location: Right Arm)   Pulse (!) 109   Temp 98.3 F (36.8 C) (Oral)   Resp 16   Ht 6\' 4"  (1.93 m)   Wt 84.8 kg   SpO2 96%   BMI 22.76 kg/m  Physical Exam Vitals and nursing note reviewed.  Constitutional:      General: He is not in acute distress.    Appearance: He is well-developed.  HENT:     Head: Normocephalic and atraumatic.  Eyes:     Conjunctiva/sclera: Conjunctivae normal.     Pupils: Pupils are equal, round, and reactive to light.  Cardiovascular:     Rate and Rhythm: Normal rate and regular rhythm.     Heart sounds: No murmur heard. Pulmonary:     Effort: Pulmonary effort is normal. No respiratory distress.     Breath sounds: Normal breath sounds. No wheezing or rales.  Abdominal:     General: There is no distension.     Palpations: Abdomen is  soft.     Tenderness: There is no abdominal tenderness. There is no guarding or rebound.  Musculoskeletal:        General: No tenderness. Normal range of motion.     Cervical back: Normal range of motion and neck supple.  Skin:    General: Skin is warm and dry.     Findings: No erythema or rash.  Neurological:     Mental Status: He is alert and oriented to person, place, and time.  Psychiatric:        Attention and Perception: He is inattentive. He does not perceive auditory or visual hallucinations.        Speech: Speech is tangential.        Behavior: Behavior is withdrawn. Behavior is cooperative.        Thought Content: Thought content is delusional. Thought content includes suicidal ideation.     ED Results / Procedures / Treatments   Labs (all labs ordered are listed,  but only abnormal results are displayed) Labs Reviewed  CBC WITH DIFFERENTIAL/PLATELET - Abnormal; Notable for the following components:      Result Value   RBC 3.55 (*)    Hemoglobin 9.2 (*)    HCT 29.5 (*)    MCH 25.9 (*)    Neutro Abs 1.1 (*)    Monocytes Absolute 1.1 (*)    All other components within normal limits  BASIC METABOLIC PANEL - Abnormal; Notable for the following components:   Sodium 130 (*)    Chloride 94 (*)    BUN 23 (*)    Creatinine, Ser 1.30 (*)    Calcium 8.5 (*)    All other components within normal limits  SARS CORONAVIRUS 2 BY RT PCR  ETHANOL  RAPID URINE DRUG SCREEN, HOSP PERFORMED    EKG None  Radiology No results found.  Procedures Procedures    Medications Ordered in ED Medications  benztropine (COGENTIN) tablet 1 mg (1 mg Oral Given 02/28/22 1244)  Darunavir-Cobicistat-Emtricitabine-Tenofovir Alafenamide (SYMTUZA) 800-150-200-10 MG TABS 1 tablet (has no administration in time range)  gabapentin (NEURONTIN) capsule 300 mg (has no administration in time range)  haloperidol (HALDOL) tablet 5 mg (5 mg Oral Given 02/28/22 1244)  traZODone (DESYREL) tablet 150 mg (has no administration in time range)  lactated ringers bolus 1,000 mL (1,000 mLs Intravenous Not Given 02/28/22 1341)    ED Course/ Medical Decision Making/ A&P                           Medical Decision Making Amount and/or Complexity of Data Reviewed External Data Reviewed: notes.    Details: Last psych hospitalization Labs: ordered. Decision-making details documented in ED Course.  Risk Prescription drug management.   Pt presenting today with a complaint that caries a high risk for morbidity and mortality.  Patient here today complaining of suicidal ideation.  Patient appears medically clear other than some nasal congestion.  He has been off meds for several months.  His medications were ordered including his HIV medication.  Low suspicion for pneumonia today as patient is  satting 98% on room air has clear breath sounds.  Screening labs are pending.  Patient will need psychiatric evaluation.  2:31 PM I independently interpreted patient's low today and CBC with normal white count, hemoglobin of 9 from 10 several months ago, BMP with mild hypokalemia of 130 today with normal potassium and creatinine of 1.3 from baseline of 0.7.  Suspect an element  of dehydration but normal sugar and anion gap.  EtOH is negative.  Patient is eating and drinking now and feel that he can orally replace his fluids as it sounds like he has not been eating much lately.  Feel that he is at this time medically clear for psychiatric evaluation.         Final Clinical Impression(s) / ED Diagnoses Final diagnoses:  Suicidal ideation  Schizophrenia, unspecified type [facility])    Rx / DC Orders ED Discharge Orders     None         Gwyneth Sprout, MD 02/28/22 1431

## 2022-03-01 ENCOUNTER — Emergency Department (HOSPITAL_COMMUNITY): Payer: No Typology Code available for payment source

## 2022-03-01 DIAGNOSIS — F332 Major depressive disorder, recurrent severe without psychotic features: Secondary | ICD-10-CM | POA: Diagnosis present

## 2022-03-01 DIAGNOSIS — R45851 Suicidal ideations: Secondary | ICD-10-CM

## 2022-03-01 DIAGNOSIS — J189 Pneumonia, unspecified organism: Secondary | ICD-10-CM | POA: Diagnosis not present

## 2022-03-01 LAB — BASIC METABOLIC PANEL
Anion gap: 7 (ref 5–15)
BUN: 19 mg/dL (ref 6–20)
CO2: 27 mmol/L (ref 22–32)
Calcium: 8.4 mg/dL — ABNORMAL LOW (ref 8.9–10.3)
Chloride: 101 mmol/L (ref 98–111)
Creatinine, Ser: 1.02 mg/dL (ref 0.61–1.24)
GFR, Estimated: >60 mL/min (ref >60)
Glucose, Bld: 101 mg/dL — ABNORMAL HIGH (ref 70–99)
Potassium: 4.4 mmol/L (ref 3.5–5.1)
Sodium: 135 mmol/L (ref 135–145)

## 2022-03-01 NOTE — ED Provider Notes (Signed)
Emergency Medicine Observation Re-evaluation Note  Belinda Lopezperez is a 36 y.o. male, seen on rounds today.  Pt initially presented to the ED for complaints of Suicidal Currently, the patient is resting.  Physical Exam  BP 110/69   Pulse 91   Temp 97.8 F (36.6 C) (Oral)   Resp 17   Ht 6\' 4"  (1.93 m)   Wt 84.8 kg   SpO2 96%   BMI 22.76 kg/m  Physical Exam General: calm Cardiac: well perfused  Lungs: even respirations  Psych: calm  ED Course / MDM  EKG:   I have reviewed the labs performed to date as well as medications administered while in observation.  Recent changes in the last 24 hours include patient evaluated by TTS. Creatinine slightly elevated but no AKI. Patient received IVF. Plan for repeat chemistry and reassess.  Plan  Current plan is for TTS disposition.  Repeated BMP and CXR. COVID negative. Creatinine improved along with Na. Patient medically clear.     , MD 03/01/22 1400

## 2022-03-01 NOTE — Progress Notes (Signed)
[facility] LCSW Progress Note   03/01/2022    5:43 PM  Michael Chandler   [id number]   Type of Contact and Topic:  Psychiatric Bed Placement   Pt accepted to  [facility]  rm 406-1  Patient meets inpatient criteria per  Dahlia Byes, NP  The attending provider will be Phineas Inches, MD   Call report to [phone]    Welford Roche, RN @ [facility] notified.     Pt scheduled  to arrive after 2000.   Signed:  Corky Crafts, MSW, LCSWA, LCAS 03/01/2022 5:45 PM

## 2022-03-01 NOTE — ED Notes (Signed)
Breakfast tray given. °

## 2022-03-01 NOTE — Consult Note (Addendum)
[place] ED ASSESSMENT   Reason for Consult:  Psychiatric evaluation Referring Physician:  ER Physician Patient Identification: Michael Chandler MRN:  [id number] ED Chief Complaint: Major depressive disorder, recurrent severe without psychotic features ([place])  Diagnosis:  Principal Problem:   Major depressive disorder, recurrent severe without psychotic features ([place]) Active Problems:   Suicide ideation   ED Assessment Time Calculation: Start Time: 1142 Stop Time: 1203 Total Time in Minutes (Assessment Completion): 21   Subjective:   Michael Chandler is a 36 y.o. male patient admitted with hx significant for Methamphetamine abuse, HIV disease and Schizophrenia came to the ER yesterday with complaint of suicide ideation.  Patient was hospitalized at [facility]  in August 2023 for treatment of Schizophrenia.  HPI:  Patient was able to engage in meaningful conversation this morning.  Patient reported that he is tired of being sick and he feels worthless and hopeless.  Patient repeatedly stated"  I want to die, I have no reasons to live for.  I am tired of living this kind of life"  Patient admitted that after his hospitalization in August he has not taken any of his medications.  He went to [facility] for something and while there was hospitalized with Pneumonia.  He says today that he still have symptoms associated with Pneuma as he did not complete his treatment there.  Patient does not have outpatient Psychiatrist and is not engaged in any outpatient therapy.  He plans to suffocate himself as a means of suicide.  He tried drowning himself in 2020 but did not succeed he said.  Today he remains suicidal but denies HI/AVH and no mention of paranoia. AA Male, 36 years old who appears older than stated age alert  and oriented x4, emaciated, disheveled and unkempt came in to the ER for suicide ideation with plan to suffocate himself.  We will resume his home Medications while we seek inpatient Psychiatry  hospitalization.  Past Psychiatric History: Hx significant for Schizophrenia, Methamphetamine abuse.  He was hospitalized at [facility] in August.  Did not report any other hospitalization.  Self reported suicide attempt by Drowning but was not successful.  No reported outpatient provider.  Risk to Self or Others: Is the patient at risk to self? Yes Has the patient been a risk to self in the past 6 months? Yes Has the patient been a risk to self within the distant past? Yes Is the patient a risk to others? No Has the patient been a risk to others in the past 6 months? No Has the patient been a risk to others within the distant past? No  [place] Scale:  [place] [facility] from 02/28/2022 in [facility] DEPT Admission (Discharged) from 12/17/2021 in [place] ED from 12/16/2021 in [facility] RISK CATEGORY High Risk No Risk No Risk       AIMS:  , , ,  ,   ASAM:    Substance Abuse:     Past Medical History:  Past Medical History:  Diagnosis Date   HIV (human immunodeficiency virus infection) ([place])    Schizophrenia ([place])    per IVC paperwork, pt states does not have this dx   History reviewed. No pertinent surgical history. Family History: History reviewed. No pertinent family history. Family Psychiatric  History: Denied Social History:  Social History   Substance and Sexual Activity  Alcohol Use Yes     Social History   Substance and Sexual  Activity  Drug Use Yes   Types: Methamphetamines, Marijuana, Cocaine   Comment: pt denies at this time    Social History   Socioeconomic History   Marital status: Single    Spouse name: Not on file   Number of children: Not on file   Years of education: Not on file   Highest education level: Not on file  Occupational History   Not on file  Tobacco Use   Smoking status: Every Day   Smokeless tobacco: Never   Substance and Sexual Activity   Alcohol use: Yes   Drug use: Yes    Types: Methamphetamines, Marijuana, Cocaine    Comment: pt denies at this time   Sexual activity: Not on file  Other Topics Concern   Not on file  Social History Narrative   Not on file   Social Determinants of Health   Financial Resource Strain: Not on file  Food Insecurity: Not on file  Transportation Needs: Not on file  Physical Activity: Not on file  Stress: Not on file  Social Connections: Not on file   Additional Social History:    Allergies:   Allergies  Allergen Reactions   Bactrim [Sulfamethoxazole-Trimethoprim] Anaphylaxis    Labs:  Results for orders placed or performed during the hospital encounter of 02/28/22 (from the past 48 hour(s))  CBC with Differential/Platelet     Status: Abnormal   Collection Time: 02/28/22 12:31 PM  Result Value Ref Range   WBC 4.0 4.0 - 10.5 K/uL   RBC 3.55 (L) 4.22 - 5.81 MIL/uL   Hemoglobin 9.2 (L) 13.0 - 17.0 g/dL   HCT 01.7 (L) 51.0 - 25.8 %   MCV 83.1 80.0 - 100.0 fL   MCH 25.9 (L) 26.0 - 34.0 pg   MCHC 31.2 30.0 - 36.0 g/dL   RDW 52.7 78.2 - 42.3 %   Platelets 318 150 - 400 K/uL   nRBC 0.0 0.0 - 0.2 %   Neutrophils Relative % 27 %   Neutro Abs 1.1 (L) 1.7 - 7.7 K/uL   Lymphocytes Relative 45 %   Lymphs Abs 1.8 0.7 - 4.0 K/uL   Monocytes Relative 28 %   Monocytes Absolute 1.1 (H) 0.1 - 1.0 K/uL   Eosinophils Relative 0 %   Eosinophils Absolute 0.0 0.0 - 0.5 K/uL   Basophils Relative 0 %   Basophils Absolute 0.0 0.0 - 0.1 K/uL   Immature Granulocytes 0 %   Abs Immature Granulocytes 0.01 0.00 - 0.07 K/uL   Smudge Cells PRESENT    Ovalocytes PRESENT     Comment: Performed at [facility], [address] [place], [place] [address]  Basic metabolic panel     Status: Abnormal   Collection Time: 02/28/22 12:31 PM  Result Value Ref Range   Sodium 130 (L) 135 - 145 mmol/L   Potassium 3.7 3.5 - 5.1 mmol/L   Chloride 94 (L) 98 -  111 mmol/L   CO2 24 22 - 32 mmol/L   Glucose, Bld 80 70 - 99 mg/dL    Comment: Glucose reference range applies only to samples taken after fasting for at least 8 hours.   BUN 23 (H) 6 - 20 mg/dL   Creatinine, Ser 4.31 (H) 0.61 - 1.24 mg/dL   Calcium 8.5 (L) 8.9 - 10.3 mg/dL   GFR, Estimated >54 >00 mL/min    Comment: (NOTE) Calculated using the CKD-EPI Creatinine Equation (2021)    Anion gap 12 5 - 15  Comment: Performed at [facility], [place] [address], [place], [place] [address]  Ethanol     Status: None   Collection Time: 02/28/22 12:31 PM  Result Value Ref Range   Alcohol, Ethyl (B) <10 <10 mg/dL    Comment: (NOTE) Lowest detectable limit for serum alcohol is 10 mg/dL.  For medical purposes only. Performed at [facility], [place] [address] [place], [place] [address]   SARS Coronavirus 2 by RT PCR (hospital order, performed in [facility] hospital lab) *cepheid single result test* Anterior Nasal Swab     Status: None   Collection Time: 02/28/22 12:34 PM   Specimen: Anterior Nasal Swab  Result Value Ref Range   SARS Coronavirus 2 by RT PCR NEGATIVE NEGATIVE    Comment: (NOTE) SARS-CoV-2 target nucleic acids are NOT DETECTED.  The SARS-CoV-2 RNA is generally detectable in upper and lower respiratory specimens during the acute phase of infection. The lowest concentration of SARS-CoV-2 viral copies this assay can detect is 250 copies / mL. A negative result does not preclude SARS-CoV-2 infection and should not be used as the sole basis for treatment or other patient management decisions.  A negative result may occur with improper specimen collection / handling, submission of specimen other than nasopharyngeal swab, presence of viral mutation(s) within the areas targeted by this assay, and inadequate number of viral copies (<250 copies / mL). A negative result must be combined with clinical observations, patient history, and  epidemiological information.  Fact Sheet for Patients:   https://www.patel.info/  Fact Sheet for Healthcare Providers: https://hall.com/  This test is not yet approved or  cleared by the [place] FDA and has been authorized for detection and/or diagnosis of SARS-CoV-2 by FDA under an Emergency Use Authorization (EUA).  This EUA will remain in effect (meaning this test can be used) for the duration of the COVID-19 declaration under Section 564(b)(1) of the Act, 21 U.S.C. section 360bbb-3(b)(1), unless the authorization is terminated or revoked sooner.  Performed at [facility], [place] [address] [place], [place] [id number]   Basic metabolic panel     Status: Abnormal   Collection Time: 03/01/22 11:45 AM  Result Value Ref Range   Sodium 135 135 - 145 mmol/L   Potassium 4.4 3.5 - 5.1 mmol/L   Chloride 101 98 - 111 mmol/L   CO2 27 22 - 32 mmol/L   Glucose, Bld 101 (H) 70 - 99 mg/dL    Comment: Glucose reference range applies only to samples taken after fasting for at least 8 hours.   BUN 19 6 - 20 mg/dL   Creatinine, Ser 1.02 0.61 - 1.24 mg/dL   Calcium 8.4 (L) 8.9 - 10.3 mg/dL   GFR, Estimated >60 >60 mL/min    Comment: (NOTE) Calculated using the CKD-EPI Creatinine Equation (2021)    Anion gap 7 5 - 15    Comment: Performed at [facility], [place] [address] [place], [place] [address]    Current Facility-Administered Medications  Medication Dose Route Frequency Provider Last Rate Last Admin   benztropine (COGENTIN) tablet 1 mg  1 mg Oral Daily Blanchie Dessert, MD   1 mg at 03/01/22 1100   Darunavir-Cobicistat-Emtricitabine-Tenofovir Alafenamide (SYMTUZA) 800-150-200-10 MG TABS 1 tablet  1 tablet Oral Q breakfast Plunkett, Whitney, MD       gabapentin (NEURONTIN) capsule 300 mg  300 mg Oral TID Blanchie Dessert, MD   300 mg at 03/01/22 1100   haloperidol (HALDOL) tablet  5 mg  5 mg Oral BID  Blanchie Dessert, MD   5 mg at 03/01/22 1100   lactated ringers bolus 1,000 mL  1,000 mL Intravenous Once Blanchie Dessert, MD       traZODone (DESYREL) tablet 150 mg  150 mg Oral QHS PRN Blanchie Dessert, MD   150 mg at 02/28/22 2303   Current Outpatient Medications  Medication Sig Dispense Refill   benztropine (COGENTIN) 1 MG tablet Take 1 tablet (1 mg total) by mouth daily. (Patient not taking: Reported on 02/28/2022) 30 tablet 1   dapsone 100 MG tablet Take 100 mg by mouth daily. (Patient not taking: Reported on 02/28/2022)     Darunavir-Cobicistat-Emtricitabine-Tenofovir Alafenamide (SYMTUZA) 800-150-200-10 MG TABS Take 1 tablet by mouth daily with breakfast. (Patient not taking: Reported on 02/28/2022) 30 tablet 1   gabapentin (NEURONTIN) 300 MG capsule Take 1 capsule (300 mg total) by mouth 3 (three) times daily. (Patient not taking: Reported on 02/28/2022) 90 capsule 1   haloperidol (HALDOL) 5 MG tablet Take 1 tablet (5 mg total) by mouth 2 (two) times daily. (Patient not taking: Reported on 02/28/2022) 60 tablet 1   traZODone (DESYREL) 150 MG tablet Take 1 tablet (150 mg total) by mouth at bedtime as needed for sleep. (Patient not taking: Reported on 02/28/2022) 60 tablet 1    Musculoskeletal: Strength & Muscle Tone:  Seen in Stretcher sleeping Gait & Station:  seen in stretcher Patient leans:  see above   Psychiatric Specialty Exam: Presentation  General Appearance:  Disheveled; Casual  Eye Contact: Minimal  Speech: Clear and Coherent; Normal Rate  Speech Volume: Decreased  Handedness: Right   Mood and Affect  Mood: Depressed; Anxious; Hopeless; Worthless  Affect: Congruent; Depressed; Flat   Thought Process  Thought Processes: Coherent  Descriptions of Associations:Intact  Orientation:Full (Time, Place and Person)  Thought Content:Logical  History of Schizophrenia/Schizoaffective disorder:Yes  Duration of Psychotic Symptoms:Greater than six  months  Hallucinations:Hallucinations: None  Ideas of Reference:None  Suicidal Thoughts:Suicidal Thoughts: Yes, Active SI Active Intent and/or Plan: With Intent; With Plan; With Means to Carry Out; With Access to Means  Homicidal Thoughts:Homicidal Thoughts: No   Sensorium  Memory: Immediate Good; Recent Good; Remote Good  Judgment: Intact  Insight: Fair   Community education officer  Concentration: Fair  Attention Span: Fair  Recall: Roel Cluck of Knowledge: Fair  Language: Fair   Psychomotor Activity  Psychomotor Activity: Psychomotor Activity: Decreased   Assets  Assets: Communication Skills    Sleep  Sleep: Sleep: Fair   Physical Exam: Physical Exam Constitutional:      Appearance: He is ill-appearing.  HENT:     Head: Normocephalic.     Nose: Nose normal.  Cardiovascular:     Rate and Rhythm: Normal rate.  Pulmonary:     Effort: Pulmonary effort is normal.  Musculoskeletal:        General: Normal range of motion.  Skin:    General: Skin is warm and dry.  Neurological:     Mental Status: He is alert and oriented to person, place, and time.    Review of Systems  Constitutional:  Positive for weight loss.  HENT: Negative.    Eyes: Negative.   Respiratory: Negative.    Cardiovascular: Negative.   Gastrointestinal: Negative.   Genitourinary: Negative.   Musculoskeletal: Negative.   Skin: Negative.   Neurological: Negative.   Endo/Heme/Allergies: Negative.   Psychiatric/Behavioral:  Positive for depression and suicidal ideas. The patient is nervous/anxious and has insomnia.  Blood pressure 110/69, pulse 91, temperature 97.8 F (36.6 C), temperature source Oral, resp. rate 17, height 6\' 4"  (1.93 m), weight 84.8 kg, SpO2 96 %. Body mass index is 22.76 kg/m.  Medical Decision Making: Patient is unable to contract for safety.  He feels worthless and helpless and continues to cite multiple Medical issues.  He is a danger to himself.   We will admit and seek inpatient Mental health treatment .  Meanwhile we have resumed his home Medications-  Cogentin, Haldol and Gabapentin.  The rest of his medications are resumed by EDP.  Problem 1: MDD, Single episode, severe without Psychotic features  Problem 2: Suicide ideation.  Disposition: Recommend psychiatric Inpatient admission when medically cleared. Admit, seek bed placement at a Psychiatry unit  , NP-PMHNP-BC 03/01/2022 12:24 PM

## 2022-03-01 NOTE — BH Assessment (Signed)
TTS clinician attempted to complete assessment. Per Michael Bastos, RN, patient is asleep. TTS will attempt at later time.

## 2022-03-01 NOTE — Progress Notes (Signed)
Pt is under review at [facility] per [facility] Tacy Learn, RN who has requested that vol consent be faxed to [phone].  Care Team notified: [facility] [place], RN, Dahlia Byes, NP, Gwenevere Ghazi, Dwana Melena Plaster, RN   Maryjean Ka, MSW, [facility] 03/01/2022 3:37 PM

## 2022-03-02 ENCOUNTER — Encounter (HOSPITAL_COMMUNITY): Payer: Self-pay | Admitting: Nurse Practitioner

## 2022-03-02 ENCOUNTER — Other Ambulatory Visit: Payer: Self-pay

## 2022-03-02 ENCOUNTER — Inpatient Hospital Stay (HOSPITAL_COMMUNITY)
Admission: AD | Admit: 2022-03-02 | Discharge: 2022-03-07 | DRG: 885 | Disposition: A | Discharge status: Home / Self Care | Payer: Medicare (Managed Care) | Source: Intra-hospital | Attending: Psychiatry | Admitting: Psychiatry

## 2022-03-02 DIAGNOSIS — R45851 Suicidal ideations: Secondary | ICD-10-CM | POA: Diagnosis present

## 2022-03-02 DIAGNOSIS — F17200 Nicotine dependence, unspecified, uncomplicated: Secondary | ICD-10-CM | POA: Diagnosis present

## 2022-03-02 DIAGNOSIS — B370 Candidal stomatitis: Secondary | ICD-10-CM | POA: Diagnosis present

## 2022-03-02 DIAGNOSIS — Z79899 Other long term (current) drug therapy: Secondary | ICD-10-CM

## 2022-03-02 DIAGNOSIS — F1410 Cocaine abuse, uncomplicated: Secondary | ICD-10-CM | POA: Diagnosis present

## 2022-03-02 DIAGNOSIS — Z5901 Sheltered homelessness: Secondary | ICD-10-CM

## 2022-03-02 DIAGNOSIS — F1510 Other stimulant abuse, uncomplicated: Secondary | ICD-10-CM | POA: Diagnosis present

## 2022-03-02 DIAGNOSIS — F411 Generalized anxiety disorder: Secondary | ICD-10-CM | POA: Diagnosis present

## 2022-03-02 DIAGNOSIS — Z555 Less than a high school diploma: Secondary | ICD-10-CM | POA: Diagnosis not present

## 2022-03-02 DIAGNOSIS — Z91148 Patient's other noncompliance with medication regimen for other reason: Secondary | ICD-10-CM

## 2022-03-02 DIAGNOSIS — B009 Herpesviral infection, unspecified: Secondary | ICD-10-CM | POA: Diagnosis present

## 2022-03-02 DIAGNOSIS — F332 Major depressive disorder, recurrent severe without psychotic features: Principal | ICD-10-CM | POA: Diagnosis present

## 2022-03-02 DIAGNOSIS — B20 Human immunodeficiency virus [HIV] disease: Secondary | ICD-10-CM | POA: Diagnosis present

## 2022-03-02 DIAGNOSIS — Z560 Unemployment, unspecified: Secondary | ICD-10-CM | POA: Diagnosis not present

## 2022-03-02 DIAGNOSIS — F1210 Cannabis abuse, uncomplicated: Secondary | ICD-10-CM | POA: Diagnosis present

## 2022-03-02 DIAGNOSIS — Z91199 Patient's noncompliance with other medical treatment and regimen due to unspecified reason: Secondary | ICD-10-CM

## 2022-03-02 MED ORDER — HALOPERIDOL LACTATE 5 MG/ML IJ SOLN: 5.0000 mg | Freq: Three times a day (TID) | INTRAMUSCULAR | Status: DC | PRN

## 2022-03-02 MED ORDER — DIPHENHYDRAMINE HCL 50 MG/ML IJ SOLN: 50.0000 mg | Freq: Three times a day (TID) | INTRAMUSCULAR | Status: DC | PRN

## 2022-03-02 MED ORDER — DARUN-COBIC-EMTRICIT-TENOFAF 800-150-200-10 MG PO TABS
1.0000 | ORAL_TABLET | Freq: Every day | ORAL | Status: DC
  Administered 2022-03-02: 1 via ORAL
  Filled 2022-03-02 (×4): qty 1

## 2022-03-02 MED ORDER — BENZTROPINE MESYLATE 1 MG PO TABS
1.0000 mg | ORAL_TABLET | Freq: Every day | ORAL | Status: DC
  Administered 2022-03-02: 1 mg via ORAL
  Filled 2022-03-02 (×4): qty 1

## 2022-03-02 MED ORDER — DIPHENHYDRAMINE HCL 25 MG PO CAPS: 50.0000 mg | ORAL_CAPSULE | Freq: Three times a day (TID) | ORAL | Status: DC | PRN

## 2022-03-02 MED ORDER — FLUCONAZOLE 200 MG PO TABS
400.0000 mg | ORAL_TABLET | Freq: Every day | ORAL | Status: DC
  Administered 2022-03-02 – 2022-03-07 (×6): 400 mg via ORAL
  Filled 2022-03-02: qty 2
  Filled 2022-03-02 (×2): qty 4
  Filled 2022-03-02: qty 14
  Filled 2022-03-02: qty 4
  Filled 2022-03-02: qty 2
  Filled 2022-03-02: qty 14
  Filled 2022-03-02: qty 2
  Filled 2022-03-02: qty 4
  Filled 2022-03-02: qty 14
  Filled 2022-03-02: qty 4

## 2022-03-02 MED ORDER — TRAZODONE HCL 150 MG PO TABS: 150.0000 mg | ORAL_TABLET | Freq: Every evening | ORAL | Status: DC | PRN

## 2022-03-02 MED ORDER — MAGNESIUM HYDROXIDE 400 MG/5ML PO SUSP: 30.0000 mL | Freq: Every day | ORAL | Status: DC | PRN

## 2022-03-02 MED ORDER — HALOPERIDOL 5 MG PO TABS
5.0000 mg | ORAL_TABLET | Freq: Three times a day (TID) | ORAL | Status: DC | PRN
  Filled 2022-03-02: qty 1

## 2022-03-02 MED ORDER — DAPSONE 100 MG PO TABS
100.0000 mg | ORAL_TABLET | Freq: Every day | ORAL | Status: DC
  Administered 2022-03-03 – 2022-03-07 (×5): 100 mg via ORAL
  Filled 2022-03-02 (×4): qty 1
  Filled 2022-03-02 (×2): qty 7
  Filled 2022-03-02 (×2): qty 1
  Filled 2022-03-02: qty 7

## 2022-03-02 MED ORDER — ACETAMINOPHEN 325 MG PO TABS
650.0000 mg | ORAL_TABLET | Freq: Four times a day (QID) | ORAL | Status: DC | PRN
  Administered 2022-03-02: 650 mg via ORAL
  Filled 2022-03-02: qty 2

## 2022-03-02 MED ORDER — HALOPERIDOL 5 MG PO TABS
5.0000 mg | ORAL_TABLET | Freq: Two times a day (BID) | ORAL | Status: DC
  Administered 2022-03-02: 5 mg via ORAL
  Filled 2022-03-02 (×6): qty 1

## 2022-03-02 MED ORDER — TRAZODONE HCL 50 MG PO TABS: 50.0000 mg | ORAL_TABLET | Freq: Every evening | ORAL | Status: DC | PRN

## 2022-03-02 MED ORDER — OLANZAPINE 2.5 MG PO TABS
2.5000 mg | ORAL_TABLET | Freq: Every day | ORAL | Status: DC
  Filled 2022-03-02: qty 1

## 2022-03-02 MED ORDER — LORAZEPAM 1 MG PO TABS: 2.0000 mg | ORAL_TABLET | Freq: Three times a day (TID) | ORAL | Status: DC | PRN

## 2022-03-02 MED ORDER — GABAPENTIN 300 MG PO CAPS
300.0000 mg | ORAL_CAPSULE | Freq: Three times a day (TID) | ORAL | Status: DC
  Administered 2022-03-02 – 2022-03-07 (×16): 300 mg via ORAL
  Filled 2022-03-02 (×3): qty 1
  Filled 2022-03-02: qty 21
  Filled 2022-03-02: qty 1
  Filled 2022-03-02 (×2): qty 21
  Filled 2022-03-02 (×6): qty 1
  Filled 2022-03-02: qty 21
  Filled 2022-03-02: qty 1
  Filled 2022-03-02 (×2): qty 21
  Filled 2022-03-02: qty 1
  Filled 2022-03-02: qty 21
  Filled 2022-03-02 (×2): qty 1
  Filled 2022-03-02: qty 21
  Filled 2022-03-02 (×3): qty 1
  Filled 2022-03-02: qty 21
  Filled 2022-03-02 (×2): qty 1

## 2022-03-02 MED ORDER — ALUM & MAG HYDROXIDE-SIMETH 200-200-20 MG/5ML PO SUSP
30.0000 mL | ORAL | Status: DC | PRN
  Administered 2022-03-06: 30 mL via ORAL
  Filled 2022-03-02: qty 30

## 2022-03-02 MED ORDER — MIRTAZAPINE 15 MG PO TBDP
15.0000 mg | ORAL_TABLET | Freq: Every day | ORAL | Status: DC
  Administered 2022-03-02 – 2022-03-06 (×5): 15 mg via ORAL
  Filled 2022-03-02: qty 1
  Filled 2022-03-02 (×2): qty 7
  Filled 2022-03-02 (×2): qty 1
  Filled 2022-03-02: qty 7
  Filled 2022-03-02 (×3): qty 1

## 2022-03-02 MED ORDER — BICTEGRAVIR-EMTRICITAB-TENOFOV 50-200-25 MG PO TABS
1.0000 | ORAL_TABLET | Freq: Every day | ORAL | Status: DC
  Administered 2022-03-02 – 2022-03-07 (×6): 1 via ORAL
  Filled 2022-03-02: qty 7
  Filled 2022-03-02: qty 1
  Filled 2022-03-02: qty 7
  Filled 2022-03-02 (×3): qty 1
  Filled 2022-03-02: qty 7
  Filled 2022-03-02 (×3): qty 1

## 2022-03-02 MED ORDER — DULOXETINE HCL 20 MG PO CPEP
20.0000 mg | ORAL_CAPSULE | Freq: Every day | ORAL | Status: DC
  Filled 2022-03-02 (×2): qty 1

## 2022-03-02 MED ORDER — LORAZEPAM 2 MG/ML IJ SOLN: 2.0000 mg | Freq: Three times a day (TID) | INTRAMUSCULAR | Status: DC | PRN

## 2022-03-02 NOTE — H&P (Signed)
Psychiatric Admission Assessment Adult  Patient Identification: Michael Chandler MRN:  [id number] Date of Evaluation:  03/02/2022 Chief Complaint:  Major depressive disorder, recurrent severe without psychotic features (HCC) [F33.2] Principal Diagnosis: Major depressive disorder, recurrent severe without psychotic features (HCC) Diagnosis:  Principal Problem:   Major depressive disorder, recurrent severe without psychotic features (HCC)  History of Present Illness:  Michael Chandler 36 year old male with past psychiatric history of schizophrenia (per chart), methamphetamine abuse who presented to [facility] via EMS for SI. Patient reported to have just flown in from [place] where he reported, "I am ready to die". Per chart review, patient recently seen in [facility], [place] for atypical PNA (02/14/22); he was discharge with 3 month supply of medication where he reported returning to [facility] and declining any follow up referrals for PCP. Seen 02/05/22 [facility] for depression and suicidal ideations, 01/30/22 [facility] for depression, [facility] 12/17/21 depression. Per Care Everywhere patient has records in [place], [place] and [place] health systems.    24 hour chart review:  Patient reports ongoing body aches; currently using wheelchair to move around unit. Did not attend evening groups. Reports lesions on groin area; denies any pain in the area.  Vital signs low. Remains in his room majority of shift. Poor PO intake.   Assessment:  Patient presents laying in bed. Generally ill appearance. Facial wasting noted. Low voice. Reports "not feeling good". Reports reason for admission as "confused and suicidal yesterday". States he was diagnosed with HIV 2012 and has not received treatment due to "something I think I can beat, but the way I'm feeling now I need some help. I need some medication". States being from [place], [place] and his mother currently lives in [place]. Completed 11th grade. Last  worked in 2020 at [employer]; reports currently waiting for housing authority assistance. Endorses poor appetite and sleep. General malaise. Reports testing positive for syphilis recently where he states taking 3 shots of penicillin then began talking about '5 stages of syphilis'. States he currently lives at hotels and the streets; denies engaging in any sex work.   He denies any active depression or anxiety; reports feeling physically ill. "Can't stop my body from hurting". Provider discussed restarting HIV regimen and depression medications; he is currently amenable to treatment. He identifies his mother as a support; unwilling to grant permission to speak to her for collateral information. He denies any active suicidal, homicidal ideations, or auditory, visual hallucinations. He mentions being telepathic; won't elaborate.   Past Psychiatric Hx: Previous Psych Diagnoses: schizophrenia (date unknown), amphetamine use disorder, substance abuse,  Prior inpatient treatment: yes  Current/prior outpatient treatment: None Prior rehab hx: None Psychotherapy hx: None History of suicide attempts: Denies  History of homicide or aggression: Denies Psychiatric medication history: Gabapentin, Haloperidol, Trazodone Psychiatric medication compliance history: non-compliant Current Psychiatrist: None Current therapist: None   Substance Abuse Hx: Alcohol: occasionally Tobacco: Denies use Illicit drugs: amphetamine use hx; UDS pending Rx drug abuse: Denies Rehab hx: Denies   Past Medical History: Medical Diagnoses: HIV, CD4 count <100, RPR+, PCP, thrush Home Rx: All home medications listed above Prior Hosp: As listed above Prior Surgeries/Trauma: laminectomy Head trauma, LOC, concussions, seizures: None noted Allergies: Bactrim LMP: N/A Contraception: n/a PCP: none   Family History: Medical: Denies any Psych: denies Psych Rx: Unsure SA/HA: None Substance use family hx: Denies    Social History: Originally from [place], [place]. Patient reports that he lives locally in hotels and streets.  Receives SSDi for income. Identifies mom is social support.    Abuse: Denies  Marital Status: single Sexual orientation: Homosexual Children: None Employment: On disability Peer Group: None Housing: unstable Finances: Disability income Legal: denies Special educational needs teacher: Denies Hotel manager experience   Consult: Infectious Disease [name] [phone] -Restart:  [name] daily   - Dapsone 100 mg daily (PCP prophylaxis)  - Fluconazole PO 400 mg daily x14 days  - RPR improved; possibly related to previous exposure  Associated Signs/Symptoms: Depression Symptoms:  depressed mood, anhedonia, fatigue, feelings of worthlessness/guilt, difficulty concentrating, hopelessness, impaired memory, suicidal thoughts without plan, loss of energy/fatigue, disturbed sleep, Duration of Depression Symptoms: Greater than two weeks  (Hypo) Manic Symptoms:  Impulsivity, Anxiety Symptoms:   none noted Psychotic Symptoms:  Delusions,; r/o organic cause PTSD Symptoms: NA Total Time spent with patient: 45 minutes  Past Psychiatric History: patient denies; per chart review,   Is the patient at risk to self? No.  Has the patient been a risk to self in the past 6 months? No.  Has the patient been a risk to self within the distant past? Yes.    Is the patient a risk to others? No.  Has the patient been a risk to others in the past 6 months? No.  Has the patient been a risk to others within the distant past? No.   [place] Scale:  Flowsheet Row [facility]) [facility] from 02/28/2022 in [facility] Admission (Discharged) from 12/17/2021 in [facility]AL MEDICINE  C-SSRS RISK CATEGORY No Risk High Risk No Risk      Prior Inpatient Therapy:  yes Prior Outpatient  Therapy:  yes; poor follow-up  Alcohol Screening: 1. How often do you have a drink containing alcohol?: Monthly or less 2. How many drinks containing alcohol do you have on a typical day when you are drinking?: 1 or 2 3. How often do you have six or more drinks on one occasion?: Less than monthly AUDIT-C Score: 2 4. How often during the last year have you found that you were not able to stop drinking once you had started?: Never 5. How often during the last year have you failed to do what was normally expected from you because of drinking?: Less than monthly 6. How often during the last year have you needed a first drink in the morning to get yourself going after a heavy drinking session?: Less than monthly 7. How often during the last year have you had a feeling of guilt of remorse after drinking?: Never 8. How often during the last year have you been unable to remember what happened the night before because you had been drinking?: Never 9. Have you or someone else been injured as a result of your drinking?: No 10. Has a relative or friend or a doctor or another health worker been concerned about your drinking or suggested you cut down?: No Alcohol Use Disorder Identification Test Final Score (AUDIT): 4 Alcohol Brief Interventions/Follow-up: Alcohol education/Brief advice Substance Abuse History in the last 12 months:  Yes.   Consequences of Substance Abuse: Medical Consequences:  predisposition to decline in overall health given active diagnoses Previous Psychotropic Medications: Yes  Psychological Evaluations: Yes  Past Medical History:  Past Medical History:  Diagnosis Date   HIV (human immunodeficiency virus infection) (HCC)    Schizophrenia (HCC)    per IVC paperwork, pt states does not have this dx  History reviewed. No pertinent surgical history. Family History: History reviewed. No pertinent family history. Family Psychiatric  History: denies any family history Tobacco  Screening:  denies Social History: polysubstance abuse, HIV+ CD4 count <50, poor health maintenance/non-compliance  Social History   Substance and Sexual Activity  Alcohol Use Yes     Social History   Substance and Sexual Activity  Drug Use Yes   Types: Methamphetamines, Marijuana, Cocaine   Comment: pt denies at this time    Additional Social History: Marital status: Single Are you sexually active?: No What is your sexual orientation?: bisexual Has your sexual activity been affected by drugs, alcohol, medication, or emotional stress?: No Does patient have children?: No   Allergies:   Allergies  Allergen Reactions   Bactrim [Sulfamethoxazole-Trimethoprim] Anaphylaxis   Lab Results:  Results for orders placed or performed during the hospital encounter of 02/28/22 (from the past 48 hour(s))  Basic metabolic panel     Status: Abnormal   Collection Time: 03/01/22 11:45 AM  Result Value Ref Range   Sodium 135 135 - 145 mmol/L   Potassium 4.4 3.5 - 5.1 mmol/L   Chloride 101 98 - 111 mmol/L   CO2 27 22 - 32 mmol/L   Glucose, Bld 101 (H) 70 - 99 mg/dL    Comment: Glucose reference range applies only to samples taken after fasting for at least 8 hours.   BUN 19 6 - 20 mg/dL   Creatinine, Ser 8.52 0.61 - 1.24 mg/dL   Calcium 8.4 (L) 8.9 - 10.3 mg/dL   GFR, Estimated >77 >82 mL/min    Comment: (NOTE) Calculated using the CKD-EPI Creatinine Equation (2021)    Anion gap 7 5 - 15    Comment: Performed at [facility], [address], [place], [place] [address]   Blood Alcohol level:  Lab Results  Component Value Date   [facility] <10 02/28/2022   ETH <10 12/16/2021   Metabolic Disorder Labs:  Lab Results  Component Value Date   HGBA1C 6.2 (H) 12/18/2021   MPG 131.24 12/18/2021   No results found for: "PROLACTIN" Lab Results  Component Value Date   CHOL 160 12/18/2021   TRIG 399 (H) 12/18/2021   HDL 30 (L) 12/18/2021   CHOLHDL 5.3 12/18/2021   VLDL 80  (H) 12/18/2021   LDLCALC 50 12/18/2021   Current Medications: Current Facility-Administered Medications  Medication Dose Route Frequency Provider Last Rate Last Admin   acetaminophen (TYLENOL) tablet 650 mg  650 mg Oral Q6H PRN Dahlia Byes C, NP   650 mg at 03/02/22 0656   alum & mag hydroxide-simeth (MAALOX/MYLANTA) 200-200-20 MG/5ML suspension 30 mL  30 mL Oral Q4H PRN Dahlia Byes C, NP       benztropine (COGENTIN) tablet 1 mg  1 mg Oral Daily Onuoha, Josephine C, NP   1 mg at 03/02/22 0656   Darunavir-Cobicistat-Emtricitabine-Tenofovir Alafenamide (SYMTUZA) 800-150-200-10 MG TABS 1 tablet  1 tablet Oral Q breakfast Dahlia Byes C, NP   1 tablet at 03/02/22 0929   gabapentin (NEURONTIN) capsule 300 mg  300 mg Oral TID Dahlia Byes C, NP   300 mg at 03/02/22 1250   haloperidol (HALDOL) tablet 5 mg  5 mg Oral BID Dahlia Byes C, NP   5 mg at 03/02/22 0656   magnesium hydroxide (MILK OF MAGNESIA) suspension 30 mL  30 mL Oral Daily PRN Dahlia Byes C, NP       traZODone (DESYREL) tablet 150 mg  150 mg Oral  QHS PRN Earney Navy, NP       PTA Medications: Medications Prior to Admission  Medication Sig Dispense Refill Last Dose   benztropine (COGENTIN) 1 MG tablet Take 1 tablet (1 mg total) by mouth daily. (Patient not taking: Reported on 02/28/2022) 30 tablet 1    dapsone 100 MG tablet Take 100 mg by mouth daily. (Patient not taking: Reported on 02/28/2022)      Darunavir-Cobicistat-Emtricitabine-Tenofovir Alafenamide (SYMTUZA) 800-150-200-10 MG TABS Take 1 tablet by mouth daily with breakfast. (Patient not taking: Reported on 02/28/2022) 30 tablet 1    gabapentin (NEURONTIN) 300 MG capsule Take 1 capsule (300 mg total) by mouth 3 (three) times daily. (Patient not taking: Reported on 02/28/2022) 90 capsule 1    haloperidol (HALDOL) 5 MG tablet Take 1 tablet (5 mg total) by mouth 2 (two) times daily. (Patient not taking: Reported on 02/28/2022) 60 tablet 1     traZODone (DESYREL) 150 MG tablet Take 1 tablet (150 mg total) by mouth at bedtime as needed for sleep. (Patient not taking: Reported on 02/28/2022) 60 tablet 1    Musculoskeletal: Strength & Muscle Tone: within normal limits Gait & Station: normal Patient leans: N/A  Psychiatric Specialty Exam:  Presentation  General Appearance:  Other (comment) (generally ill; older than stated age)  Eye Contact: Fair  Speech: Clear and Coherent  Speech Volume: Decreased  Handedness: Right  Mood and Affect  Mood: Dysphoric  Affect: Flat  Thought Process  Thought Processes: Coherent  Duration of Psychotic Symptoms: Greater than six months  Past Diagnosis of Schizophrenia or Psychoactive disorder: Yes  Descriptions of Associations:Intact  Orientation:Full (Time, Place and Person)  Thought Content:Illogical  Hallucinations:Hallucinations: None  Ideas of Reference:None  Suicidal Thoughts:Suicidal Thoughts: No SI Active Intent and/or Plan: With Intent; With Plan; With Means to Carry Out; With Access to Means  Homicidal Thoughts:Homicidal Thoughts: No  Sensorium  Memory: Immediate Fair; Recent Fair  Judgment: Fair  Insight: Shallow  Executive Functions  Concentration: Fair  Attention Span: Fair  Recall: [employer] of Knowledge: Fair  Language: Fair  Psychomotor Activity  Psychomotor Activity: Psychomotor Activity: Decreased  Assets  Assets: Communication Skills; Resilience  Sleep  Sleep: Sleep: Fair  Physical Exam: Physical Exam Vitals and nursing note reviewed.  Constitutional:      Appearance: He is ill-appearing.  HENT:     Head: Normocephalic.     Comments: Facial wasting noted    Nose: Nose normal.     Mouth/Throat:     Mouth: Mucous membranes are moist.     Pharynx: Oropharynx is clear.  Eyes:     Pupils: Pupils are equal, round, and reactive to light.  Cardiovascular:     Rate and Rhythm: Normal rate.     Pulses: Normal  pulses.  Pulmonary:     Effort: Pulmonary effort is normal.  Abdominal:     Palpations: Abdomen is soft.  Musculoskeletal:        General: Normal range of motion.     Cervical back: Normal range of motion.  Skin:    General: Skin is warm and dry.  Neurological:     Mental Status: He is oriented to person, place, and time.  Psychiatric:        Attention and Perception: Attention normal. He does not perceive auditory or visual hallucinations.        Mood and Affect: Affect is flat.        Speech: Speech is delayed.  Behavior: Behavior is withdrawn. Behavior is cooperative.        Thought Content: Thought content is not paranoid or delusional. Thought content does not include homicidal or suicidal ideation. Thought content does not include homicidal or suicidal plan.    Review of Systems  Psychiatric/Behavioral:  Positive for depression and substance abuse. Negative for suicidal ideas.    Blood pressure (!) 101/50, pulse 78, temperature 98.6 F (37 C), temperature source Oral, resp. rate 20, height 6\' 5"  (1.956 m), weight 79.4 kg, SpO2 100 %. Body mass index is 20.75 kg/m.  Treatment Plan Summary: Daily contact with patient to assess and evaluate symptoms and progress in treatment, Medication management, and Plan :   PLAN:  Principal Problem:   Major depressive disorder, recurrent severe without psychotic features (HCC)   - Remeron 15 mg HS: depression, appetite stimulation  HIV:      - Restart:  - Biktarvy daily   - Dapsone 100 mg daily (PCP prophylaxis)  - Fluconazole PO 400 mg daily x14 days  Observation Level/Precautions:  15 minute checks  Laboratory:  CBC Chemistry Profile HbAIC HCG UDS UA CD4 Count, HSV, RPR, UDS, U/A  Psychotherapy:  group, milieu  Medications:  see MAR  Consultations:  social work, Infectious Disease  Discharge Concerns:  safety  Estimated LOS: 7-10  Other:     Physician Treatment Plan for Primary Diagnosis: Major depressive  disorder, recurrent severe without psychotic features (HCC) Long Term Goal(s): Improvement in symptoms so as ready for discharge  Short Term Goals: Ability to identify changes in lifestyle to reduce recurrence of condition will improve, Ability to verbalize feelings will improve, Ability to disclose and discuss suicidal ideas, Ability to demonstrate self-control will improve, Ability to identify and develop effective coping behaviors will improve, Ability to maintain clinical measurements within normal limits will improve, Compliance with prescribed medications will improve, and Ability to identify triggers associated with substance abuse/mental health issues will improve  Physician Treatment Plan for Secondary Diagnosis: Principal Problem:   Major depressive disorder, recurrent severe without psychotic features (HCC)  Long Term Goal(s): Improvement in symptoms so as ready for discharge  Short Term Goals: Ability to identify changes in lifestyle to reduce recurrence of condition will improve, Ability to verbalize feelings will improve, Ability to disclose and discuss suicidal ideas, Ability to demonstrate self-control will improve, Ability to identify and develop effective coping behaviors will improve, Ability to maintain clinical measurements within normal limits will improve, Compliance with prescribed medications will improve, and Ability to identify triggers associated with substance abuse/mental health issues will improve  I certify that inpatient services furnished can reasonably be expected to improve the patient's condition.    Loletta ParishBrooke A Leevy-Johnson, NP 11/9/20233:18 PM

## 2022-03-02 NOTE — Progress Notes (Signed)
Pt c/o of sores on his groin.  RN examined lesions and they were intact, no drainage noted.  Pt requesting topical cream for pain.  PMHNP notified and will assess pt.  Pt declined Tylenol at this time for pain.

## 2022-03-02 NOTE — Progress Notes (Signed)
Pt reported to the writer that he almost fell in his room this morning. Pt is requesting a wheelchair for easy ambulation, will continue to monitor.
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
## 2022-03-02 NOTE — BHH Group Notes (Signed)
BHH Group Notes:  (Nursing/MHT/Case Management/Adjunct)  Date:  03/02/2022  Time:  10:21 AM  Group Topic/Focus:  Goals Group: The focus of this group is to help patients establish daily goals to achieve during treatment and discuss how the patient can incorporate goal setting into their daily lives to aide in recovery.   Participation Level:  Did Not Attend  Summary of Progress/Problems:  Patient did not attend orientation/ goals group.   Daneil Dan 03/02/2022, 10:21 AM

## 2022-03-02 NOTE — Progress Notes (Signed)
Michael Chandler is a 36 y.o. male voluntarily admitted for suicide ideation with a plan to suffocate himself. Pt stated he does not see the reason for leaving any more. Pt has a hx of substance use, he is HIV positive and complain of body aches. Pt reported having developed blisters on both feet due to walking too much. Pt is placed on high fall risk due to being unsteady on his feet.  Pt has been cooperative with admission process, alert and oriented, denied SI/HI, AVH and contracted for safety. Consents signed, skin/belongings search completed and pt oriented to unit. Pt stable at this time. Pt given the opportunity to express concerns and ask questions. Pt given toiletries. Will continue to monitor.

## 2022-03-02 NOTE — BHH Suicide Risk Assessment (Signed)
BHH INPATIENT:  Family/Significant Other Suicide Prevention Education  Suicide Prevention Education:  Patient Refusal for Family/Significant Other Suicide Prevention Education: The patient Michael Chandler has refused to provide written consent for family/significant other to be provided Family/Significant Other Suicide Prevention Education during admission and/or prior to discharge.  Physician notified.  Metro Kung Prudencio Velazco 03/02/2022, 12:00 PM

## 2022-03-02 NOTE — Tx Team (Signed)
Initial Treatment Plan 03/02/2022 2:18 AM Michael Chandler [id number]:[id number]    PATIENT STRESSORS: Financial difficulties   Health problems   Occupational concerns   Substance abuse     PATIENT STRENGTHS: Capable of independent living  Communication skills    PATIENT IDENTIFIED PROBLEMS: Depression  Anxiety  "Stop my body from hurting"  "Be able to focus"               DISCHARGE CRITERIA:  Ability to meet basic life and health needs Adequate post-discharge living arrangements Improved stabilization in mood, thinking, and/or behavior Verbal commitment to aftercare and medication compliance  PRELIMINARY DISCHARGE PLAN: Attend aftercare/continuing care group Attend PHP/IOP Outpatient therapy  PATIENT/FAMILY INVOLVEMENT: This treatment plan has been presented to and reviewed with the patient, Michael Chandler, and/or family member.  The patient and family have been given the opportunity to ask questions and make suggestions.  Bethann Punches, RN 03/02/2022, 2:18 AM

## 2022-03-02 NOTE — Progress Notes (Signed)
   03/02/22 0939  Psych Admission Type (Psych Patients Only)  Admission Status Voluntary  Psychosocial Assessment  Patient Complaints Anxiety;Depression  Eye Contact Fair  Facial Expression Anxious  Affect Anxious;Depressed;Sad  Speech Logical/coherent  Interaction Assertive  Motor Activity Slow  Appearance/Hygiene Unremarkable  Behavior Characteristics Cooperative  Mood Depressed  Thought Process  Coherency WDL  Content Delusions  Delusions Paranoid  Perception Hallucinations  Hallucination None reported or observed  Judgment Poor  Confusion None  Danger to Self  Current suicidal ideation? Denies  Danger to Others  Danger to Others None reported or observed

## 2022-03-03 ENCOUNTER — Encounter (HOSPITAL_COMMUNITY): Payer: Self-pay

## 2022-03-03 DIAGNOSIS — F332 Major depressive disorder, recurrent severe without psychotic features: Principal | ICD-10-CM

## 2022-03-03 LAB — CD4/CD8 (T-HELPER/T-SUPPRESSOR CELL)
CD4 absolute: 85 /uL — ABNORMAL LOW (ref 400–1790)
CD4%: 8.08 % — ABNORMAL LOW (ref 33–65)
CD8 T Cell Abs: 675 /uL (ref 190–1000)
CD8tox: 64.21 % — ABNORMAL HIGH (ref 12–40)
Ratio: 0.13 — ABNORMAL LOW (ref 1.0–3.0)
Total lymphocyte count: 1051 /uL (ref 1000–4000)

## 2022-03-03 LAB — RAPID URINE DRUG SCREEN, HOSP PERFORMED
Amphetamines: NOT DETECTED
Barbiturates: NOT DETECTED
Benzodiazepines: NOT DETECTED
Cocaine: NOT DETECTED
Opiates: NOT DETECTED
Tetrahydrocannabinol: NOT DETECTED

## 2022-03-03 LAB — HSV(HERPES SIMPLEX VRS) I + II AB-IGG
HSV 1 Glycoprotein G Ab, IgG: 23.1 index — ABNORMAL HIGH (ref 0.00–0.90)
HSV 2 Glycoprotein G Ab, IgG: 7.75 index — ABNORMAL HIGH (ref 0.00–0.90)

## 2022-03-03 LAB — URINALYSIS, COMPLETE (UACMP) WITH MICROSCOPIC
Bacteria, UA: NONE SEEN
Bilirubin Urine: NEGATIVE
Glucose, UA: NEGATIVE mg/dL
Hgb urine dipstick: NEGATIVE
Ketones, ur: NEGATIVE mg/dL
Leukocytes,Ua: NEGATIVE
Nitrite: NEGATIVE
Protein, ur: 100 mg/dL — AB
Specific Gravity, Urine: 1.014 (ref 1.005–1.030)
pH: 7 (ref 5.0–8.0)

## 2022-03-03 LAB — RPR
RPR Ser Ql: REACTIVE — AB
RPR Titer: 1:2 {titer}

## 2022-03-03 MED ORDER — POLYETHYLENE GLYCOL 3350 17 G PO PACK
17.0000 g | PACK | Freq: Every day | ORAL | Status: DC
  Administered 2022-03-03 – 2022-03-07 (×4): 17 g via ORAL
  Filled 2022-03-03: qty 7
  Filled 2022-03-03 (×3): qty 1
  Filled 2022-03-03: qty 7
  Filled 2022-03-03: qty 1
  Filled 2022-03-03: qty 7
  Filled 2022-03-03 (×2): qty 1

## 2022-03-03 MED ORDER — WHITE PETROLATUM EX OINT
TOPICAL_OINTMENT | CUTANEOUS | Status: AC
  Filled 2022-03-03: qty 5

## 2022-03-03 NOTE — Progress Notes (Signed)
   03/03/22 2150  Psych Admission Type (Psych Patients Only)  Admission Status Voluntary  Psychosocial Assessment  Patient Complaints Depression;Anxiety  Eye Contact Fair  Facial Expression Blank  Affect Depressed  Speech Logical/coherent;Soft  Interaction Guarded  Motor Activity Other (Comment) (mostly in bed)  Appearance/Hygiene Disheveled;Poor hygiene  Behavior Characteristics Cooperative  Mood Depressed  Thought Process  Coherency WDL  Content WDL  Delusions None reported or observed  Perception WDL  Hallucination None reported or observed  Judgment Poor  Confusion None  Danger to Self  Current suicidal ideation? Denies  Agreement Not to Harm Self Yes  Description of Agreement verbal contract  Danger to Others  Danger to Others None reported or observed   D: Patient in his room on approach. Mood and affect appeared depressed. Pt did answer all of writer's questions and asked for snacks which was given.  A: Medications administered as prescribed. Support and encouragement provided as needed.  R: Patient remains safe on the unit. Plan of care ongoing for safety and stability.

## 2022-03-03 NOTE — Group Note (Signed)
LCSW Group Therapy Note  Group Date: 03/03/2022 Start Time: 1300 End Time: 1400   Type of Therapy and Topic:  Group Therapy: Using "I" Statements  Participation Level:  Active  Description of Group:  Patients were asked to provide details of some interpersonal conflicts they have experienced. Patients were then educated about "I" statements, communication which focuses on feelings or views of the speaker rather than what the other person is doing. T group members were asked to reflect on past conflicts and to provide specific examples for utilizing "I" statements.  Therapeutic Goals:  Patients will verbalize understanding of ineffective communication and effective communication. Patients will be able to empathize with whom they are having conflict. Patients will practice effective communication in the form of "I" statements.    Summary of Patient Progress:  The patient was present/active throughout the session and proved open to feedback from CSW and peers. Patient demonstrated insight into the subject matter and was respectful of peer.  The Pt was able to demonstrate how to use I-Statements and other types of conflict resolutions methods.    Therapeutic Modalities:   Cognitive Behavioral Therapy Solution-Focused Therapy    Aram Beecham, LCSW 03/03/2022  2:00 PM

## 2022-03-03 NOTE — Progress Notes (Signed)
   03/02/22 2100  Psych Admission Type (Psych Patients Only)  Admission Status Voluntary  Psychosocial Assessment  Patient Complaints Anxiety;Depression;Worrying;Isolation  Eye Contact Fair  Facial Expression Grimacing  Affect Depressed;Anxious;Sad  Speech Logical/coherent  Interaction Assertive  Motor Activity Slow  Appearance/Hygiene Unremarkable  Behavior Characteristics Cooperative;Appropriate to situation  Mood Depressed;Anxious  Thought Process  Coherency WDL  Content WDL  Delusions None reported or observed  Perception WDL  Hallucination None reported or observed  Judgment Poor  Confusion None  Danger to Self  Current suicidal ideation? Denies  Agreement Not to Harm Self Yes  Description of Agreement verbal  Danger to Others  Danger to Others None reported or observed

## 2022-03-03 NOTE — Progress Notes (Addendum)
CSW provided the Pt with a packet that contains information including shelter and housing resources, free and reduced price food information, clothing resources, crisis center information, a [employer] card, and suicide prevention information.   

## 2022-03-03 NOTE — Progress Notes (Signed)
   03/03/22 0800  Psych Admission Type (Psych Patients Only)  Admission Status Voluntary  Psychosocial Assessment  Patient Complaints Anxiety;Depression;Worrying  Eye Contact Fair  Facial Expression Grimacing  Affect Depressed;Anxious;Sad  Speech Logical/coherent  Interaction Assertive  Motor Activity Slow  Appearance/Hygiene Unremarkable  Behavior Characteristics Cooperative;Appropriate to situation  Mood Depressed;Anxious  Thought Process  Coherency WDL  Content WDL  Delusions None reported or observed  Perception WDL  Hallucination None reported or observed  Judgment Poor  Confusion None  Danger to Self  Current suicidal ideation? Denies  Agreement Not to Harm Self Yes  Description of Agreement Verbal  Danger to Others  Danger to Others None reported or observed

## 2022-03-03 NOTE — BH IP Treatment Plan (Signed)
Interdisciplinary Treatment and Diagnostic Plan Update  03/03/2022 Time of Session: 1000 Michael Chandler MRN: [id number]  Principal Diagnosis: Major depressive disorder, recurrent severe without psychotic features (HCC)  Secondary Diagnoses: Principal Problem:   Major depressive disorder, recurrent severe without psychotic features (HCC) Active Problems:   Methamphetamine abuse (HCC)   HIV disease (HCC)   Suicide ideation   Current Medications:  Current Facility-Administered Medications  Medication Dose Route Frequency Provider Last Rate Last Admin   acetaminophen (TYLENOL) tablet 650 mg  650 mg Oral Q6H PRN Dahlia Byes C, NP   650 mg at 03/02/22 0656   alum & mag hydroxide-simeth (MAALOX/MYLANTA) 200-200-20 MG/5ML suspension 30 mL  30 mL Oral Q4H PRN Earney Navy, NP       bictegravir-emtricitabine-tenofovir AF (BIKTARVY) 50-200-25 MG per tablet 1 tablet  1 tablet Oral Daily Leevy-Johnson, Brooke A, NP   1 tablet at 03/03/22 0803   dapsone tablet 100 mg  100 mg Oral Daily Leevy-Johnson, Brooke A, NP   100 mg at 03/03/22 1309   haloperidol (HALDOL) tablet 5 mg  5 mg Oral TID PRN Phineas Inches, MD       And   LORazepam (ATIVAN) tablet 2 mg  2 mg Oral TID PRN Massengill, Harrold Donath, MD       And   diphenhydrAMINE (BENADRYL) capsule 50 mg  50 mg Oral TID PRN Massengill, Harrold Donath, MD       haloperidol lactate (HALDOL) injection 5 mg  5 mg Intramuscular TID PRN Massengill, Harrold Donath, MD       And   LORazepam (ATIVAN) injection 2 mg  2 mg Intramuscular TID PRN Massengill, Harrold Donath, MD       And   diphenhydrAMINE (BENADRYL) injection 50 mg  50 mg Intramuscular TID PRN Massengill, Harrold Donath, MD       fluconazole (DIFLUCAN) tablet 400 mg  400 mg Oral Daily Leevy-Johnson, Brooke A, NP   400 mg at 03/03/22 0802   gabapentin (NEURONTIN) capsule 300 mg  300 mg Oral TID Dahlia Byes C, NP   300 mg at 03/03/22 1310   magnesium hydroxide (MILK OF MAGNESIA) suspension 30 mL  30 mL Oral Daily  PRN Dahlia Byes C, NP       mirtazapine (REMERON SOL-TAB) disintegrating tablet 15 mg  15 mg Oral QHS Massengill, Nathan, MD   15 mg at 03/02/22 2110   polyethylene glycol (MIRALAX / GLYCOLAX) packet 17 g  17 g Oral Daily Starleen Blue, NP   17 g at 03/03/22 1028   PTA Medications: Medications Prior to Admission  Medication Sig Dispense Refill Last Dose   benztropine (COGENTIN) 1 MG tablet Take 1 tablet (1 mg total) by mouth daily. (Patient not taking: Reported on 02/28/2022) 30 tablet 1    dapsone 100 MG tablet Take 100 mg by mouth daily. (Patient not taking: Reported on 02/28/2022)      Darunavir-Cobicistat-Emtricitabine-Tenofovir Alafenamide (SYMTUZA) 800-150-200-10 MG TABS Take 1 tablet by mouth daily with breakfast. (Patient not taking: Reported on 02/28/2022) 30 tablet 1    gabapentin (NEURONTIN) 300 MG capsule Take 1 capsule (300 mg total) by mouth 3 (three) times daily. (Patient not taking: Reported on 02/28/2022) 90 capsule 1    haloperidol (HALDOL) 5 MG tablet Take 1 tablet (5 mg total) by mouth 2 (two) times daily. (Patient not taking: Reported on 02/28/2022) 60 tablet 1    traZODone (DESYREL) 150 MG tablet Take 1 tablet (150 mg total) by mouth at bedtime as needed for sleep. (Patient not taking:  Reported on 02/28/2022) 60 tablet 1     Patient Stressors: Financial difficulties   Health problems   Occupational concerns   Substance abuse    Patient Strengths: Capable of independent living  Communication skills   Treatment Modalities: Medication Management, Group therapy, Case management,  1 to 1 session with clinician, Psychoeducation, Recreational therapy.   Physician Treatment Plan for Primary Diagnosis: Major depressive disorder, recurrent severe without psychotic features (HCC) Long Term Goal(s): Improvement in symptoms so as ready for discharge   Short Term Goals: Ability to identify changes in lifestyle to reduce recurrence of condition will improve Ability to verbalize  feelings will improve Ability to disclose and discuss suicidal ideas Ability to demonstrate self-control will improve Ability to identify and develop effective coping behaviors will improve Ability to maintain clinical measurements within normal limits will improve Compliance with prescribed medications will improve Ability to identify triggers associated with substance abuse/mental health issues will improve  Medication Management: Evaluate patient's response, side effects, and tolerance of medication regimen.  Therapeutic Interventions: 1 to 1 sessions, Unit Group sessions and Medication administration.  Evaluation of Outcomes: Progressing  Physician Treatment Plan for Secondary Diagnosis: Principal Problem:   Major depressive disorder, recurrent severe without psychotic features (HCC) Active Problems:   Methamphetamine abuse (HCC)   HIV disease (HCC)   Suicide ideation  Long Term Goal(s): Improvement in symptoms so as ready for discharge   Short Term Goals: Ability to identify changes in lifestyle to reduce recurrence of condition will improve Ability to verbalize feelings will improve Ability to disclose and discuss suicidal ideas Ability to demonstrate self-control will improve Ability to identify and develop effective coping behaviors will improve Ability to maintain clinical measurements within normal limits will improve Compliance with prescribed medications will improve Ability to identify triggers associated with substance abuse/mental health issues will improve     Medication Management: Evaluate patient's response, side effects, and tolerance of medication regimen.  Therapeutic Interventions: 1 to 1 sessions, Unit Group sessions and Medication administration.  Evaluation of Outcomes: Progressing   RN Treatment Plan for Primary Diagnosis: Major depressive disorder, recurrent severe without psychotic features (HCC) Long Term Goal(s): Knowledge of disease and  therapeutic regimen to maintain health will improve  Short Term Goals: Ability to remain free from injury will improve, Ability to verbalize frustration and anger appropriately will improve, Ability to demonstrate self-control, Ability to participate in decision making will improve, Ability to verbalize feelings will improve, Ability to disclose and discuss suicidal ideas, Ability to identify and develop effective coping behaviors will improve, and Compliance with prescribed medications will improve  Medication Management: RN will administer medications as ordered by provider, will assess and evaluate patient's response and provide education to patient for prescribed medication. RN will report any adverse and/or side effects to prescribing provider.  Therapeutic Interventions: 1 on 1 counseling sessions, Psychoeducation, Medication administration, Evaluate responses to treatment, Monitor vital signs and CBGs as ordered, Perform/monitor CIWA, COWS, AIMS and Fall Risk screenings as ordered, Perform wound care treatments as ordered.  Evaluation of Outcomes: Progressing   LCSW Treatment Plan for Primary Diagnosis: Major depressive disorder, recurrent severe without psychotic features (HCC) Long Term Goal(s): Safe transition to appropriate next level of care at discharge, Engage patient in therapeutic group addressing interpersonal concerns.  Short Term Goals: Engage patient in aftercare planning with referrals and resources, Increase social support, Increase ability to appropriately verbalize feelings, Increase emotional regulation, Facilitate acceptance of mental health diagnosis and concerns, Facilitate patient progression through stages  of change regarding substance use diagnoses and concerns, Identify triggers associated with mental health/substance abuse issues, and Increase skills for wellness and recovery  Therapeutic Interventions: Assess for all discharge needs, 1 to 1 time with Social worker,  Explore available resources and support systems, Assess for adequacy in community support network, Educate family and significant other(s) on suicide prevention, Complete Psychosocial Assessment, Interpersonal group therapy.  Evaluation of Outcomes: Progressing   Progress in Treatment: Attending groups: Yes. Participating in groups: Yes. Taking medication as prescribed: Yes. Toleration medication: Yes. Family/Significant other contact made: No, will contact:  Patient declined  Patient understands diagnosis: Yes. Discussing patient identified problems/goals with staff: Yes. Medical problems stabilized or resolved: Yes. Denies suicidal/homicidal ideation: No. Issues/concerns per patient self-inventory: Yes. Other: none  New problem(s) identified: No, Describe:  none  New Short Term/Long Term Goal(s): Patient to work towards detox, medication management for mood stabilization; elimination of SI thoughts; development of comprehensive mental wellness/sobriety plan.  Patient Goals: Patient states their goal for treatment is to "get properly medicated."  Discharge Plan or Barriers: No psychosocial barriers identified at this time, patient to return to place of residence when appropriate for discharge.   Reason for Continuation of Hospitalization: Depression Medication stabilization  Estimated Length of Stay: 1-7 days   Scribe for Treatment Team: Almedia Balls 03/03/2022 2:02 PM
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
## 2022-03-03 NOTE — Group Note (Signed)
Date:  03/03/2022 Time:  11:51 AM  Group Topic/Focus:  Goals Group:   The focus of this group is to help patients establish daily goals to achieve during treatment and discuss how the patient can incorporate goal setting into their daily lives to aide in recovery.    Participation Level:  Did Not Attend  Participation Quality:      Affect:      Cognitive:      Insight:  Engagement in Group:    Modes of Intervention:      Additional Comments:     Reymundo Poll 03/03/2022, 11:51 AM

## 2022-03-04 LAB — LIPID PANEL
Cholesterol: 166 mg/dL (ref 0–200)
HDL: 27 mg/dL — ABNORMAL LOW (ref >40)
LDL Cholesterol: 89 mg/dL (ref 0–99)
Total CHOL/HDL Ratio: 6.1 RATIO
Triglycerides: 248 mg/dL — ABNORMAL HIGH (ref <150)
VLDL: 50 mg/dL — ABNORMAL HIGH (ref 0–40)

## 2022-03-04 LAB — TSH: TSH: 0.659 u[IU]/mL (ref 0.350–4.500)

## 2022-03-04 LAB — HEMOGLOBIN A1C
Hgb A1c MFr Bld: 5.7 % — ABNORMAL HIGH (ref 4.8–5.6)
Mean Plasma Glucose: 116.89 mg/dL

## 2022-03-04 MED ORDER — VALACYCLOVIR HCL 500 MG PO TABS
1000.0000 mg | ORAL_TABLET | Freq: Two times a day (BID) | ORAL | Status: DC
  Administered 2022-03-04 – 2022-03-07 (×7): 1000 mg via ORAL
  Filled 2022-03-04 (×3): qty 2
  Filled 2022-03-04 (×2): qty 28
  Filled 2022-03-04 (×2): qty 2
  Filled 2022-03-04 (×2): qty 28
  Filled 2022-03-04: qty 2
  Filled 2022-03-04: qty 28
  Filled 2022-03-04: qty 2

## 2022-03-04 MED ORDER — DM-GUAIFENESIN ER 30-600 MG PO TB12
1.0000 | ORAL_TABLET | Freq: Two times a day (BID) | ORAL | Status: DC | PRN
  Administered 2022-03-04 – 2022-03-06 (×2): 1 via ORAL
  Filled 2022-03-04 (×2): qty 1

## 2022-03-04 NOTE — [facility] Group Notes (Signed)
Psychoeducational Group Note    Date:  1111/2023 Time: 1300-1400    Purpose of Group: . The group focus' on teaching patients on how to identify their needs and their Life Skills:  A group where two lists are made. What people need and what are things that we do that are unhealthy. The lists are developed by the patients and it is explained that we often do the actions that are not healthy to get our list of needs met.  Goal:: to develop the coping skills needed to get their needs met  Participation Level:  did not attend  Ellyssa Zagal A    

## 2022-03-04 NOTE — Group Note (Signed)
LCSW Group Therapy Note  03/04/2022    10:00-11:00am   Type of Therapy and Topic:  Group Therapy: Shame and Its Impact   Participation Level:  Did Not Attend   Description of Group:   In this group, patients shared and discussed that guilt is the negative feeling we have when we've done something wrong, while shame is the negative feeling we have simply about "being."  In listening to each other share, patients learned that humans are all imperfect and that there is no shame in this.  We discussed how it could positively impact our wellbeing by accepting our faults as part of our being that can be worked on but does not have to shame [place].    Therapeutic Goals: Patients will learn the difference between guilt and shame. Patients will share their current shame feelings and how this has impacted their current lives. Patients will explore possible ways to think differently about those parts of their bodies, feelings, and lives about which they do have shame. Patients will learn that shame is universal, and that keeping our shame a secret actually increases its hold on [place].  Summary of Patient Progress:  Patient was invited to group, did not attend.   Therapeutic Modalities:   Cognitive Behavioral Therapy Motivation Interviewing  Lynnell Chad  .

## 2022-03-04 NOTE — Progress Notes (Addendum)
[facility] MD Progress Note  03/04/2022 11:34 AM Michael Chandler  MRN:  [id number] Principal Problem: Major depressive disorder, recurrent severe without psychotic features (HCC) Diagnosis: Principal Problem:   Major depressive disorder, recurrent severe without psychotic features (HCC) Active Problems:   HIV disease (HCC)   Methamphetamine abuse (HCC)   Suicide ideation  Reason For Admission: Michael Chandler 36 year old male with past psychiatric history of schizophrenia (per chart), methamphetamine abuse who presented to [facility] via EMS for SI. He reports originally being from [place], [place] and mother moved to [place] area years ago. Patient reported to have just flown in from [place] where he reported, "I am ready to die". Per chart review, patient recently seen in [facility], [place] for atypical PNA (02/14/22); he was discharge with 3 month supply of medication where he reported returning to [facility] and declining any follow up referrals for PCP. Seen 02/05/22 [facility] for depression and suicidal ideations, 01/30/22 [facility] for depression, [facility] 12/17/21 depression. Per Care Everywhere patient has records in [place], [place] and [place] health systems. Medical history of HIV/AIDS, syphilis with medication non-compliance; notes he was diagnosed with HIV 2012 and refused treatment due to "something I think I can beat". Completed 11th grade. Last worked in 2020 at [employer]; reports currently waiting for housing authority assistance. Reports currently living in hotels and the streets; denies engaging in any sex work.   24 hr chart review: Patient continues to use wheelchair for ambulation around unit. Attended x1 unit group. Noted to have disheveled appearance.  Vital signs improving; HR elevated 110. Remains in his room majority of shift. Poor PO intake. Medication compliant.   Assessment: Patient presents laying in bed. Disheveled, generally ill appearance. Low voice.  Oriented x3. Depressed, dysphoric mood. Withdrawn. Congruent affect. Fleeting eye contact. Reports "feeling a lot better. My mind was so confused when I came here". States reason for admission as "confused in the mind and having suicidal thoughts". Last showered yesterday. He endorses attending groups and meals; per chart review patient has only attended x1 in past 24 hours. He continues to deny any active depression or anxiety; reports ongoing physical complaints including general malaise and weakness. Rates anxiety and depression today 0/10. Provider discussed results of HSV tests and the need to begin anti-viral therapy to current regimen. Patient became visibly upset and perseverative about results.He denies any history of diagnosis or outbreaks; mentioned rash being related to cellulitis. Provider spent extensive time discussing the impact medication compliance has on the management of the viruses. He remains amenable to treatment. Patient endorses plan to remain in [place] area and continue treatment with ID clinic post-discharge. Appointment scheduled for 03/13/22. He is unable to state definite plan for housing post-discharge and mentioned possibly living with his mother; stated he didn't want mother to know about his illness. Provider spent time discussing the limitations of information provided to mother based on his request as well as the benefits of having her included in his treatment plan as a support. States he will "think about it" but insists he does not want mother knowing about his diagnoses. He denies any active suicidal, homicidal ideations, or auditory, visual hallucinations. Insight is shallow, however he does appear to possibly be an inconsistent historian. He denies any medication side effects; no tremor or obvious EPS s/s noted. Plan to start anti-viral therapy, otherwise no change to medication regimen at this time.  Total Time spent with patient: 45 minutes  Past Psychiatric  History:  MDD  Past Medical History:  Past Medical History:  Diagnosis Date   HIV (human immunodeficiency virus infection) (HCC)    Schizophrenia (HCC)    per IVC paperwork, pt states does not have this dx   History reviewed. No pertinent surgical history. Family History: History reviewed. No pertinent family history. Family Psychiatric  History: none reported Social History:  Social History   Substance and Sexual Activity  Alcohol Use Yes     Social History   Substance and Sexual Activity  Drug Use Yes   Types: Methamphetamines, Marijuana, Cocaine   Comment: pt denies at this time    Social History   Socioeconomic History   Marital status: Single    Spouse name: Not on file   Number of children: Not on file   Years of education: Not on file   Highest education level: Not on file  Occupational History   Not on file  Tobacco Use   Smoking status: Every Day   Smokeless tobacco: Never  Substance and Sexual Activity   Alcohol use: Yes   Drug use: Yes    Types: Methamphetamines, Marijuana, Cocaine    Comment: pt denies at this time   Sexual activity: Not Currently  Other Topics Concern   Not on file  Social History Narrative   Not on file   Social Determinants of Health   Financial Resource Strain: Not on file  Food Insecurity: Food Insecurity Present (03/02/2022)   Hunger Vital Sign    Worried About Running Out of Food in the Last Year: Not on file    Ran Out of Food in the Last Year: Sometimes true  Transportation Needs: Not on file  Physical Activity: Not on file  Stress: Not on file  Social Connections: Not on file   Additional Social History:   Sleep: Good  Appetite:  Good  Current Medications: Current Facility-Administered Medications  Medication Dose Route Frequency Provider Last Rate Last Admin   acetaminophen (TYLENOL) tablet 650 mg  650 mg Oral Q6H PRN Dahlia Byes C, NP   650 mg at 03/02/22 0656   alum & mag hydroxide-simeth (MAALOX/MYLANTA)  200-200-20 MG/5ML suspension 30 mL  30 mL Oral Q4H PRN Earney Navy, NP       bictegravir-emtricitabine-tenofovir AF (BIKTARVY) 50-200-25 MG per tablet 1 tablet  1 tablet Oral Daily Leevy-Johnson, Brooke A, NP   1 tablet at 03/04/22 0802   dapsone tablet 100 mg  100 mg Oral Daily Leevy-Johnson, Brooke A, NP   100 mg at 03/04/22 0801   haloperidol (HALDOL) tablet 5 mg  5 mg Oral TID PRN Phineas Inches, MD       And   LORazepam (ATIVAN) tablet 2 mg  2 mg Oral TID PRN Massengill, Harrold Donath, MD       And   diphenhydrAMINE (BENADRYL) capsule 50 mg  50 mg Oral TID PRN Massengill, Harrold Donath, MD       haloperidol lactate (HALDOL) injection 5 mg  5 mg Intramuscular TID PRN Massengill, Harrold Donath, MD       And   LORazepam (ATIVAN) injection 2 mg  2 mg Intramuscular TID PRN Massengill, Harrold Donath, MD       And   diphenhydrAMINE (BENADRYL) injection 50 mg  50 mg Intramuscular TID PRN Massengill, Harrold Donath, MD       fluconazole (DIFLUCAN) tablet 400 mg  400 mg Oral Daily Leevy-Johnson, Brooke A, NP   400 mg at 03/04/22 0801   gabapentin (NEURONTIN) capsule 300 mg  300 mg Oral TID Dahlia Byes C, NP   300 mg at 03/04/22 0801   magnesium hydroxide (MILK OF MAGNESIA) suspension 30 mL  30 mL Oral Daily PRN Dahlia Byes C, NP       mirtazapine (REMERON SOL-TAB) disintegrating tablet 15 mg  15 mg Oral QHS Massengill, Harrold Donath, MD   15 mg at 03/03/22 2124   polyethylene glycol (MIRALAX / GLYCOLAX) packet 17 g  17 g Oral Daily Starleen Blue, NP   17 g at 03/04/22 0801   valACYclovir (VALTREX) tablet 1,000 mg  1,000 mg Oral BID Leevy-Johnson, Brooke A, NP   1,000 mg at 03/04/22 0865    Lab Results:  Results for orders placed or performed during the hospital encounter of 03/02/22 (from the past 48 hour(s))  RPR     Status: Abnormal   Collection Time: 03/02/22  6:10 PM  Result Value Ref Range   RPR Ser Ql Reactive (A) NON REACTIVE    Comment: SENT FOR CONFIRMATION   RPR Titer 1:2     Comment: Performed at  [facility], [address] [place], [place] [address]  HSV(herpes simplex vrs) 1+2 ab-IgG     Status: Abnormal   Collection Time: 03/02/22  6:10 PM  Result Value Ref Range   HSV 1 Glycoprotein G Ab, IgG 23.10 (H) 0.00 - 0.90 index    Comment: (NOTE)                                 Negative        <0.91                                 Equivocal 0.91 - 1.09                                 Positive        >1.09 Note: Negative indicates no antibodies detected to HSV-1. Equivocal may suggest early infection.  If clinically appropriate, retest at later date. Positive indicates antibodies detected to HSV-1.    HSV 2 Glycoprotein G Ab, IgG 7.75 (H) 0.00 - 0.90 index    Comment: (NOTE)                                 Negative        <0.91                                 Equivocal 0.91 - 1.09                                 Positive        >1.09 HSV-2 Antibody Interpretation: Negative indicates no detectable antibodies to HSV-2 were found. If recent exposure is suspected, retest in 4-6 weeks. Equivocal samples should be retested in 4-6 weeks. Positive indicates the presence of detectable IgG antibody to HSV-2. False positive results may occur. Repeat testing, or testing by a different method, may be indicated in some settings (e.g. patients with low likelihood of HSV infection). If clinically appropriate, retest 4-6 weeks later. Performed At: [facility] [employer]  [address] [place], [place] [id number] Jolene Schimke MD WU:[id number]   Cd4/cd8 (t-helper/t-suppressor cell)     Status: Abnormal   Collection Time: 03/02/22  6:10 PM  Result Value Ref Range   Total lymphocyte count 1,051 1,000 - 4,000 /uL   CD4% 8.08 (L) 33 - 65 %   CD4 absolute 85 (L) 400 - 1,790 /uL   CD8tox 64.21 (H) 12 - 40 %   CD8 T Cell Abs 675 190 - 1,000 /uL   Ratio 0.13 (L) 1.0 - 3.0    Comment: Performed at [facility], [address], [place], [place] [address]  Rapid urine drug  screen (hospital performed)     Status: None   Collection Time: 03/03/22  7:09 PM  Result Value Ref Range   Opiates NONE DETECTED NONE DETECTED   Cocaine NONE DETECTED NONE DETECTED   Benzodiazepines NONE DETECTED NONE DETECTED   Amphetamines NONE DETECTED NONE DETECTED   Tetrahydrocannabinol NONE DETECTED NONE DETECTED   Barbiturates NONE DETECTED NONE DETECTED    Comment: (NOTE) DRUG SCREEN FOR MEDICAL PURPOSES ONLY.  IF CONFIRMATION IS NEEDED FOR ANY PURPOSE, NOTIFY LAB WITHIN 5 DAYS.  LOWEST DETECTABLE LIMITS FOR URINE DRUG SCREEN Drug Class                     Cutoff (ng/mL) Amphetamine and metabolites    1000 Barbiturate and metabolites    200 Benzodiazepine                 200 Opiates and metabolites        300 Cocaine and metabolites        300 THC                            50 Performed at [facility], [address] [place], [place] [address]   Urinalysis, Complete w Microscopic Urine, Clean Catch     Status: Abnormal   Collection Time: 03/03/22  7:09 PM  Result Value Ref Range   Color, Urine YELLOW YELLOW   APPearance CLEAR CLEAR   Specific Gravity, Urine 1.014 1.005 - 1.030   pH 7.0 5.0 - 8.0   Glucose, UA NEGATIVE NEGATIVE mg/dL   Hgb urine dipstick NEGATIVE NEGATIVE   Bilirubin Urine NEGATIVE NEGATIVE   Ketones, ur NEGATIVE NEGATIVE mg/dL   Protein, ur 784 (A) NEGATIVE mg/dL   Nitrite NEGATIVE NEGATIVE   Leukocytes,Ua NEGATIVE NEGATIVE   RBC / HPF 0-5 0 - 5 RBC/hpf   WBC, UA 0-5 0 - 5 WBC/hpf   Bacteria, UA NONE SEEN NONE SEEN   Squamous Epithelial / LPF 0-5 0 - 5    Comment: Performed at [facility], [address] [place], [place] [address]  TSH     Status: None   Collection Time: 03/04/22  6:52 AM  Result Value Ref Range   TSH 0.659 0.350 - 4.500 uIU/mL    Comment: Performed by a 3rd Generation assay with a functional sensitivity of <=0.01 uIU/mL. Performed at [facility], [address], [place], [place] [address]   Lipid panel     Status: Abnormal   Collection Time: 03/04/22  6:52 AM  Result Value Ref Range   Cholesterol 166 0 - 200 mg/dL   Triglycerides 324 (H) <150 mg/dL   HDL 27 (L) >40 mg/dL   Total CHOL/HDL Ratio 6.1 RATIO   VLDL 50 (H)  0 - 40 mg/dL   LDL Cholesterol 89 0 - 99 mg/dL    Comment:        Total Cholesterol/HDL:CHD Risk Coronary Heart Disease Risk Table                     Men   Women  1/2 Average Risk   3.4   3.3  Average Risk       5.0   4.4  2 X Average Risk   9.6   7.1  3 X Average Risk  23.4   11.0        Use the calculated Patient Ratio above and the CHD Risk Table to determine the patient's CHD Risk.        ATP III CLASSIFICATION (LDL):  <100     mg/dL   Optimal  244-010  mg/dL   Near or Above                    Optimal  130-159  mg/dL   Borderline  272-536  mg/dL   High  >644     mg/dL   Very High Performed at [facility], [address] [place], [place] [address]   Hemoglobin A1c     Status: Abnormal   Collection Time: 03/04/22  6:52 AM  Result Value Ref Range   Hgb A1c MFr Bld 5.7 (H) 4.8 - 5.6 %    Comment: (NOTE) Pre diabetes:          5.7%-6.4%  Diabetes:              >6.4%  Glycemic control for   <7.0% adults with diabetes    Mean Plasma Glucose 116.89 mg/dL    Comment: Performed at [facility], [address] [place], [place] [address]    Blood Alcohol level:  Lab Results  Component Value Date   [facility] <10 02/28/2022   ETH <10 12/16/2021    Metabolic Disorder Labs: Lab Results  Component Value Date   HGBA1C 5.7 (H) 03/04/2022   MPG 116.89 03/04/2022   MPG 131.24 12/18/2021   No results found for: "PROLACTIN" Lab Results  Component Value Date   CHOL 166 03/04/2022   TRIG 248 (H) 03/04/2022   HDL 27 (L) 03/04/2022   CHOLHDL 6.1 03/04/2022   VLDL 50 (H) 03/04/2022   LDLCALC 89 03/04/2022   LDLCALC 50 12/18/2021   Physical Findings: AIMS: 0 CIWA: n/a  COWS: n/a     Musculoskeletal: Strength & Muscle Tone: within normal limits Gait & Station: normal Patient leans: N/A  Psychiatric Specialty Exam:  Presentation  General Appearance:  Disheveled; Other (comment) (ill appearing)  Eye Contact: Fleeting  Speech: Clear and Coherent  Speech Volume: Decreased  Handedness: Right   Mood and Affect  Mood: Depressed; Dysphoric; Hopeless; Worthless  Affect: Librarian, academic Processes: Coherent  Descriptions of Associations:Intact  Orientation:Full (Time, Place and Person)  Thought Content:Logical; Perseveration  History of Schizophrenia/Schizoaffective disorder:Yes  Duration of Psychotic Symptoms:Greater than six months  Hallucinations:Hallucinations: None  Ideas of Reference:None  Suicidal Thoughts:Suicidal Thoughts: No  Homicidal Thoughts:Homicidal Thoughts: No  Sensorium  Memory: Immediate Fair; Recent Fair  Judgment: Fair  Insight: Shallow  Executive Functions  Concentration: Fair  Attention Span: Fair  Recall: [employer] of Knowledge: Fair  Language: Fair  Psychomotor Activity  Psychomotor Activity: Psychomotor Activity: Normal  Assets  Assets: Communication Skills; Resilience  Sleep  Sleep: Sleep: Good  Physical Exam: Physical Exam Constitutional:      Appearance: Normal appearance. He is ill-appearing.  HENT:     Head: Normocephalic.     Nose: Nose normal. No congestion or rhinorrhea.  Eyes:     Pupils: Pupils are equal, round, and reactive to light.  Pulmonary:     Effort: Pulmonary effort is normal. No respiratory distress.  Musculoskeletal:        General: Normal range of motion.     Cervical back: Normal range of motion.  Neurological:     Mental Status: He is oriented to person, place, and time.     Sensory: No sensory deficit.  Psychiatric:        Attention and Perception: He does not perceive auditory or visual hallucinations.        Mood and  Affect: Mood is depressed. Affect is flat.        Speech: He is noncommunicative.        Behavior: Behavior is withdrawn.        Thought Content: Thought content is not paranoid or delusional. Thought content does not include homicidal ideation. Thought content does not include homicidal or suicidal plan.    Review of Systems  Constitutional:  Negative for fever.  HENT: Negative.    Eyes:  Negative for blurred vision.  Respiratory: Negative.  Negative for cough.   Cardiovascular:  Negative for chest pain.  Gastrointestinal: Negative.   Genitourinary: Negative.   Musculoskeletal:  Positive for myalgias.  Skin: Negative.   Neurological:  Negative for dizziness.  Psychiatric/Behavioral:  Positive for depression and substance abuse. Negative for hallucinations, memory loss and suicidal ideas. The patient is nervous/anxious and has insomnia.    Blood pressure 138/82, pulse (!) 110, temperature 98.6 F (37 C), temperature source Oral, resp. rate 16, height 6\' 5"  (1.956 m), weight 79.4 kg, SpO2 100 %. Body mass index is 20.75 kg/m.  Treatment Plan Summary: Treatment Plan Summary: Daily contact with patient to assess and evaluate symptoms and progress in treatment and Medication management  Observation Level/Precautions:  15 minute checks  Laboratory:  Labs reviewed   Psychotherapy:  Unit Group sessions  Medications:  [facility]  Consultations:  To be determined   Discharge Concerns:  Safety, medication compliance, mood stability  Estimated LOS: 5-7 days  Other:  N/A   Labs reviewed on 03/03/2022: RPR reactive, ID recommendations as above.  HSV pending, CD4 absolute count low at 85.  BMP, and CBC reviewed, UA pending, orders placed for lipid panel, TSH, and hemoglobin A1c. EKG ordered.  PLAN Safety and Monitoring: Voluntary admission to inpatient psychiatric unit for safety, stabilization and treatment Daily contact with patient to assess and evaluate symptoms and progress in  treatment Patient's case to be discussed in multi-disciplinary team meeting Observation Level : q15 minute checks Vital signs: q12 hours Precautions: Safety  Long Term Goal(s): Improvement in symptoms so as ready for discharge  Short Term Goals: Ability to identify changes in lifestyle to reduce recurrence of condition will improve, Ability to disclose and discuss suicidal ideas, Ability to identify and develop effective coping behaviors will improve, Compliance with prescribed medications will improve, and Ability to identify triggers associated with substance abuse/mental health issues will improve  Diagnoses:  Principal Problem:   Major depressive disorder, recurrent severe without psychotic features (HCC) Active Problems:   HIV disease (HCC)   Methamphetamine abuse (HCC)   Suicide ideation  Medications Major depressive disorder, recurrent severe without psychotic features (HCC)    -  Continue:  - Remeron 15 mg nightly for MDD, sleep and appetite stimulation  HIV disease (HCC):    - Continue:   - Biktarvy daily   - Diflucan 400 mg daily - Dapsone 100 Mg Daily   HSV I, II:     - Start:   - Valcyclovir 1000 mg BID for outbreak management  -Continue:  - Gabapentin 300 mg 3 times daily for anxiety  - Agitation protocol-Haldol 5 mg/Ativan 2 mg/Benadryl 50 mg as needed 3 times daily  Patient educated on rationales, benefits and possible side affects of all medications and verbalizes understanding.  Spoke with ID, see note above regarding follow-up after discharge.  Other PRNS -Continue Tylenol 650 mg every 6 hours PRN for mild pain -Continue Maalox 30 mg every 4 hrs PRN for indigestion -Continue Milk of Magnesia as needed every 6 hrs for constipation  Discharge Planning: Social work and case management to assist with discharge planning and identification of hospital follow-up needs prior to discharge Estimated LOS: 5-7 days Discharge Concerns: Need to establish a safety  plan; Medication compliance and effectiveness Discharge Goals: Return home with outpatient referrals for mental health follow-up including medication management/psychotherapy  I certify that inpatient services furnished can reasonably be expected to improve the patient's condition.    Loletta Parish, NP 11/11/202311:34 AM   Patient ID: Michael Chandler, male   DOB: 1986/01/09, 36 y.o.   MRN: [id number]   Total Time Spent in Direct Patient Care:  I personally spent 35 minutes on the unit in direct patient care. The direct patient care time included face-to-face time with the patient, reviewing the patient's chart, communicating with other professionals, and coordinating care. Greater than 50% of this time was spent in counseling or coordinating care with the patient regarding goals of hospitalization, psycho-education, and discharge planning needs.  I have independently evaluated the patient on 03/04/22. I reviewed the patient's chart, and I participated in key portions of the service. I discussed the case with the APP, and I agree with the assessment and plan of care as documented in the APP's note.   Thalia Party MD Psychiatrist

## 2022-03-04 NOTE — [facility] Group Notes (Signed)
Pt was invited but did not attend orientation/goals group. 

## 2022-03-04 NOTE — Progress Notes (Addendum)
Pt is A&OX4, calm, depressed, hopelessness, denies suicidal ideations, denies homicidal ideations, denies auditory hallucinations and denies visual hallucinations. Pt verbally agrees to approach staff if these become apparent and before harming self or others. Pt denies experiencing nightmares. Mood and affect are congruent. Pt appetite is ok. No complaints of anxiety, distress, pain and/or discomfort at this time. Pt's memory appears to be grossly intact, and Pt hasn't displayed any injurious behaviors. Pt is medication compliant. There's no evidence of suicidal intent. Psychomotor activity was WNL. No s/s of Parkinson, Dystonia, Akathisia and/or Tardive Dyskinesia noted.  Pt c/o post nasal drip and nasal congestion. Writer notified mental health provider who prescribed PRN Mucinex. Writer educated Pt regarding disease process and positive & hopeful outcomes when remaining on medication regimen. Pt verbalized comprehension.

## 2022-03-04 NOTE — [facility] Group Notes (Signed)
Goals Group 03/04/22   Group Focus: affirmation, clarity of thought, and goals/reality orientation Treatment Modality:  Psychoeducation Interventions utilized were assignment, group exercise, and support Purpose: To be able to understand and verbalize the reason for their admission to the hospital. To understand that the medication helps with their chemical imbalance but they also need to work on their choices in life. To be challenged to develop a list of 30 positives about themselves. Also introduce the concept that "feelings" are not reality.  Participation Level:  did not attend  Myrtis Maille A 

## 2022-03-05 LAB — T.PALLIDUM AB, TOTAL: T Pallidum Abs: REACTIVE — AB

## 2022-03-05 MED ORDER — TRAZODONE HCL 50 MG PO TABS: 50.0000 mg | ORAL_TABLET | Freq: Every day | ORAL | Status: DC

## 2022-03-05 MED ORDER — TRAZODONE HCL 50 MG PO TABS
50.0000 mg | ORAL_TABLET | Freq: Every evening | ORAL | Status: DC | PRN
  Administered 2022-03-05 – 2022-03-06 (×2): 50 mg via ORAL
  Filled 2022-03-05 (×2): qty 1

## 2022-03-05 NOTE — Progress Notes (Signed)
   03/04/22 2300  Psych Admission Type (Psych Patients Only)  Admission Status Voluntary  Psychosocial Assessment  Patient Complaints Depression;Hopelessness  Eye Contact Fair  Facial Expression Flat  Affect Flat  Speech Logical/coherent;Soft  Interaction Guarded;Isolative  Motor Activity Other (Comment) (patient stayed in bed all night. movement not observed)  Appearance/Hygiene Disheveled  Behavior Characteristics Guarded;Cooperative;Calm  Mood Depressed;Sad  Thought Process  Coherency WDL  Content WDL  Delusions None reported or observed  Perception WDL  Hallucination None reported or observed  Judgment Poor  Confusion None  Danger to Self  Current suicidal ideation? Denies  Agreement Not to Harm Self Yes  Description of Agreement verbally contracted for safety  Danger to Others  Danger to Others None reported or observed

## 2022-03-05 NOTE — Group Note (Signed)
[facility] LCSW Group Therapy Note  Date/Time:  03/05/2022  10:00AM-11:00AM  Type of Therapy and Topic:  Group Therapy:  Obstacles at Discharge  Participation Level:  Did Not Attend   Description of Group: In this process group, members discussed their anticipated obstacles to wellness when they discharge.  Patients were asked to share what their goal is at discharge.  We talked in detail about the importance of setting boundaries in our lives and how to set such boundaries.  It was discussed how sometimes we set boundaries with others and sometimes we set those boundaries with ourselves.  We then listened to different songs that dealt with addiction, depression, and anxiety.  The group discussed how they could relate to each song and how each could help them to stay focused on their wellness.    Therapeutic Goals: Patients will determine one major goal that they wish to pursue at discharge, in terms of remaining well Patients will think about and acknowledge the obstacles they think they will face at hospital discharge Patients will be able to realize that they are not alone and others are actually facing similar obstacles Patients will be introduced to ways in which to set boundaries in a healthy, productive fashion Patients will identify how music can help or harm their recovery efforts Patients will explore the use of music as a coping skill  Summary of Patient Progress:  Patient was invited to group, did not attend.   Therapeutic Modalities: Activity Processing   Hani Campusano Grossman-Orr, LCSW  

## 2022-03-05 NOTE — Progress Notes (Addendum)
[facility] MD Progress Note  03/05/2022 11:11 AM Michael Chandler  MRN:  [id number] Principal Problem: Major depressive disorder, recurrent severe without psychotic features ([place]) Diagnosis: Principal Problem:   Major depressive disorder, recurrent severe without psychotic features ([place]) Active Problems:   HIV disease ([place])   Methamphetamine abuse ([place])   Suicide ideation  Reason For Admission: Michael Chandler 36 year old male with past psychiatric history of schizophrenia (per chart), methamphetamine abuse who presented to [facility] via EMS for SI. He reports originally being from [place] and mother moved to [place] area years ago. Patient reported to have just flown in from [place] where he reported, "I am ready to die". Per chart review, patient recently seen in [facility] [place] for atypical PNA (02/14/22); he was discharge with 3 month supply of medication where he reported returning to [facility] and declining any follow up referrals for PCP. Seen 02/05/22 [facility] for depression and suicidal ideations, 01/30/22 [facility] for depression, [facility] 12/17/21 depression. Per Care Everywhere patient has records in [place], [place] and [place]. Medical history of HIV/AIDS, syphilis with medication non-compliance; notes he was diagnosed with HIV 2012 and refused treatment due to "something I think I can beat". Completed 11th grade. Last worked in 2020 at [employer]; reports currently waiting for housing authority assistance. Reports currently living in hotels and the streets; denies engaging in any sex work.   24 hr chart review: Patient ambulatory unit. No groups attended. Noted to have disheveled appearance. Improved memory. C/o post nasal drip on yesterday where he was prescribed PRN Mucinex. Vital signs improving; HR 84. Increased visibility outside of his room this shift. Improved PO intake. Medication compliant.   Assessment: Patient presents standing at nurses  station. Disheveled, generally ill appearance. Normal voice. Alert and oriented x3. Depressed, dysphoric mood. Less withdrawn. Congruent affect. Stable eye contact. Reports "feeling a lot better than yesterday. I'm almost ready to go I will just need my medications in my hand before I leave". He reports medications were filled in [place] last month but never dispensed due to full dose not being available. Showered today. He endorses improved appetite and attending meals; per chart review patient hasn't attended any groups in past 24 hours. He continues to deny any active depression or anxiety; 0/10 for both. He reports attempting contact with his mother on yesterday to see if he could sleep on her couch and mom reportedly told him she doesn't have any room for him. He reports strained relationship with his mother throughout his life and his sexuality further complicates things. States mom is aware of his HIV status but not the severity. Has x1 sister lives with mother; the two are not close either. As a result states he is not interested in having his family involved in his care as he doesn't feel they are reliable supports. He reports recently being in [place] after getting tired of riding the train and got off at the next stop which was [place]. He reports living in [place] for several years and living a "party lifestyle". Currently unemployed and receiving montly SSDi. Discussed plan to remain in [place] area and continue treatment in the area. Provider discussed [facility] services in which he declined citing not wanting a reason for anyone to petition power of attorney over his money; reports "I'll be fine". Upper body skin assessed; no open lesions or sores noted.  He denies any thoughts of paranoia, suicidal, homicidal ideations, auditory  or visual hallucinations. He is requesting assistance with finding shelters and having his medication in his hand upon discharge to ensure follow-up,  expressed interest in [employer]. States he is not familiar with area and how to maneuver to get to things. Will follow up with SW to address psychosocial needs. Improved insight noted with futuristic thinking.  He denies any medication side effects; no tremor or obvious EPS s/s noted. Plan to continue anti-viral therapy, otherwise no change to medication regimen at this time.  Total Time spent with patient: 45 minutes  Past Psychiatric History: MDD  Past Medical History:  Past Medical History:  Diagnosis Date   HIV (human immunodeficiency virus infection) ([place])    Schizophrenia ([place])    per IVC paperwork, pt states does not have this dx   History reviewed. No pertinent surgical history. Family History: History reviewed. No pertinent family history. Family Psychiatric  History: none reported Social History:  Social History   Substance and Sexual Activity  Alcohol Use Yes     Social History   Substance and Sexual Activity  Drug Use Yes   Types: Methamphetamines, Marijuana, Cocaine   Comment: pt denies at this time    Social History   Socioeconomic History   Marital status: Single    Spouse name: Not on file   Number of children: Not on file   Years of education: Not on file   Highest education level: Not on file  Occupational History   Not on file  Tobacco Use   Smoking status: Every Day   Smokeless tobacco: Never  Substance and Sexual Activity   Alcohol use: Yes   Drug use: Yes    Types: Methamphetamines, Marijuana, Cocaine    Comment: pt denies at this time   Sexual activity: Not Currently  Other Topics Concern   Not on file  Social History Narrative   Not on file   Social Determinants of Health   Financial Resource Strain: Not on file  Food Insecurity: Food Insecurity Present (03/02/2022)   Hunger Vital Sign    Worried About Running Out of Food in the Last Year: Not on file    Ran Out of Food in the Last Year: Sometimes true  Transportation Needs: Not  on file  Physical Activity: Not on file  Stress: Not on file  Social Connections: Not on file   Additional Social History:   Sleep: Good  Appetite:  Good  Current Medications: Current Facility-Administered Medications  Medication Dose Route Frequency Provider Last Rate Last Admin   acetaminophen (TYLENOL) tablet 650 mg  650 mg Oral Q6H PRN Charmaine Downs C, NP   650 mg at 03/02/22 0656   alum & mag hydroxide-simeth (MAALOX/MYLANTA) 200-200-20 MG/5ML suspension 30 mL  30 mL Oral Q4H PRN Charmaine Downs C, NP       bictegravir-emtricitabine-tenofovir AF (BIKTARVY) 50-200-25 MG per tablet 1 tablet  1 tablet Oral Daily Leevy-Johnson, Pearl Berlinger A, NP   1 tablet at 03/05/22 0947   dapsone tablet 100 mg  100 mg Oral Daily Leevy-Johnson, Lorisa Scheid A, NP   100 mg at 03/05/22 0947   dextromethorphan-guaiFENesin ([place] DM) 30-600 MG per 12 hr tablet 1 tablet  1 tablet Oral BID PRN Nwoko, Uchenna E, PA   1 tablet at 03/04/22 1708   haloperidol (HALDOL) tablet 5 mg  5 mg Oral TID PRN Massengill, Ovid Curd, MD       And   LORazepam (ATIVAN) tablet 2 mg  2 mg Oral TID PRN Massengill,  Ovid Curd, MD       And   diphenhydrAMINE (BENADRYL) capsule 50 mg  50 mg Oral TID PRN Janine Limbo, MD       haloperidol lactate (HALDOL) injection 5 mg  5 mg Intramuscular TID PRN Massengill, Ovid Curd, MD       And   LORazepam (ATIVAN) injection 2 mg  2 mg Intramuscular TID PRN Massengill, Ovid Curd, MD       And   diphenhydrAMINE (BENADRYL) injection 50 mg  50 mg Intramuscular TID PRN Massengill, Ovid Curd, MD       fluconazole (DIFLUCAN) tablet 400 mg  400 mg Oral Daily Leevy-Johnson, Kaci Freel A, NP   400 mg at 03/05/22 0947   gabapentin (NEURONTIN) capsule 300 mg  300 mg Oral TID Charmaine Downs C, NP   300 mg at 03/05/22 0947   magnesium hydroxide (MILK OF MAGNESIA) suspension 30 mL  30 mL Oral Daily PRN Charmaine Downs C, NP       mirtazapine (REMERON SOL-TAB) disintegrating tablet 15 mg  15 mg Oral QHS Massengill,  Ovid Curd, MD   15 mg at 03/04/22 2054   polyethylene glycol (MIRALAX / GLYCOLAX) packet 17 g  17 g Oral Daily Nicholes Rough, NP   17 g at 03/04/22 0801   valACYclovir (VALTREX) tablet 1,000 mg  1,000 mg Oral BID Leevy-Johnson, Kenly Henckel A, NP   1,000 mg at 03/05/22 [id number]   Lab Results:  Results for orders placed or performed during the hospital encounter of 03/02/22 (from the past 48 hour(s))  Rapid urine drug screen (hospital performed)     Status: None   Collection Time: 03/03/22  7:09 PM  Result Value Ref Range   Opiates NONE DETECTED NONE DETECTED   Cocaine NONE DETECTED NONE DETECTED   Benzodiazepines NONE DETECTED NONE DETECTED   Amphetamines NONE DETECTED NONE DETECTED   Tetrahydrocannabinol NONE DETECTED NONE DETECTED   Barbiturates NONE DETECTED NONE DETECTED    Comment: (NOTE) DRUG SCREEN FOR MEDICAL PURPOSES ONLY.  IF CONFIRMATION IS NEEDED FOR ANY PURPOSE, NOTIFY LAB WITHIN 5 DAYS.  LOWEST DETECTABLE LIMITS FOR URINE DRUG SCREEN Drug Class                     Cutoff (ng/mL) Amphetamine and metabolites    1000 Barbiturate and metabolites    200 Benzodiazepine                 200 Opiates and metabolites        300 Cocaine and metabolites        300 THC                            50 Performed at [facility], [place] [address] [place], [place] [address]   Urinalysis, Complete w Microscopic Urine, Clean Catch     Status: Abnormal   Collection Time: 03/03/22  7:09 PM  Result Value Ref Range   Color, Urine YELLOW YELLOW   APPearance CLEAR CLEAR   Specific Gravity, Urine 1.014 1.005 - 1.030   pH 7.0 5.0 - 8.0   Glucose, UA NEGATIVE NEGATIVE mg/dL   Hgb urine dipstick NEGATIVE NEGATIVE   Bilirubin Urine NEGATIVE NEGATIVE   Ketones, ur NEGATIVE NEGATIVE mg/dL   Protein, ur 100 (A) NEGATIVE mg/dL   Nitrite NEGATIVE NEGATIVE   Leukocytes,Ua NEGATIVE NEGATIVE   RBC / HPF 0-5 0 - 5 RBC/hpf   WBC, UA 0-5 0 - 5 WBC/hpf  Bacteria, UA NONE SEEN NONE SEEN    Squamous Epithelial / LPF 0-5 0 - 5    Comment: Performed at [facility], [address], [place], [place] [address]  TSH     Status: None   Collection Time: 03/04/22  6:52 AM  Result Value Ref Range   TSH 0.659 0.350 - 4.500 uIU/mL    Comment: Performed by a 3rd Generation assay with a functional sensitivity of <=0.01 uIU/mL. Performed at [facility], [address], [place], [place] [address]   Lipid panel     Status: Abnormal   Collection Time: 03/04/22  6:52 AM  Result Value Ref Range   Cholesterol 166 0 - 200 mg/dL   Triglycerides 716 (H) <150 mg/dL   HDL 27 (L) >96 mg/dL   Total CHOL/HDL Ratio 6.1 RATIO   VLDL 50 (H) 0 - 40 mg/dL   LDL Cholesterol 89 0 - 99 mg/dL    Comment:        Total Cholesterol/HDL:CHD Risk Coronary Heart Disease Risk Table                     Men   Women  1/2 Average Risk   3.4   3.3  Average Risk       5.0   4.4  2 X Average Risk   9.6   7.1  3 X Average Risk  23.4   11.0        Use the calculated Patient Ratio above and the CHD Risk Table to determine the patient's CHD Risk.        ATP III CLASSIFICATION (LDL):  <100     mg/dL   Optimal  789-381  mg/dL   Near or Above                    Optimal  130-159  mg/dL   Borderline  017-510  mg/dL   High  >258     mg/dL   Very High Performed at [facility], [address], [place], [place] [address]   Hemoglobin A1c     Status: Abnormal   Collection Time: 03/04/22  6:52 AM  Result Value Ref Range   Hgb A1c MFr Bld 5.7 (H) 4.8 - 5.6 %    Comment: (NOTE) Pre diabetes:          5.7%-6.4%  Diabetes:              >6.4%  Glycemic control for   <7.0% adults with diabetes    Mean Plasma Glucose 116.89 mg/dL    Comment: Performed at [facility], [address], [place], [place] [address]   Blood Alcohol level:  Lab Results  Component Value Date   [facility] <10 02/28/2022   ETH <10 12/16/2021   Metabolic Disorder Labs: Lab Results   Component Value Date   HGBA1C 5.7 (H) 03/04/2022   MPG 116.89 03/04/2022   MPG 131.24 12/18/2021   No results found for: "PROLACTIN" Lab Results  Component Value Date   CHOL 166 03/04/2022   TRIG 248 (H) 03/04/2022   HDL 27 (L) 03/04/2022   CHOLHDL 6.1 03/04/2022   VLDL 50 (H) 03/04/2022   LDLCALC 89 03/04/2022   LDLCALC 50 12/18/2021   Physical Findings: AIMS: 0 CIWA: n/a  COWS: n/a    Musculoskeletal: Strength & Muscle Tone: within normal limits Gait & Station: normal Patient leans: N/A  Psychiatric Specialty Exam:  Presentation  General Appearance:  Disheveled  Eye Contact: Fair  Speech: Clear and Coherent  Speech Volume: Decreased  Handedness: Right  Mood and Affect  Mood: Dysphoric; Depressed  Affect: Congruent  Thought Process  Thought Processes: Coherent  Descriptions of Associations:Intact  Orientation:Full (Time, Place and Person)  Thought Content:Logical  History of Schizophrenia/Schizoaffective disorder:Yes  Duration of Psychotic Symptoms:Greater than six months  Hallucinations:Hallucinations: None  Ideas of Reference:None  Suicidal Thoughts:Suicidal Thoughts: No  Homicidal Thoughts:Homicidal Thoughts: No  Sensorium  Memory: Immediate Fair; Recent Fair  Judgment: Fair  Insight: Shallow; Fair  Community education officer  Concentration: Fair  Attention Span: Fair  Recall: [employer] of Knowledge: Fair  Language: Fair  Psychomotor Activity  Psychomotor Activity: Psychomotor Activity: Normal  Assets  Assets: Armed forces logistics/support/administrative officer; Physical Health; Resilience; Social Support  Sleep  Sleep: Sleep: Good  Physical Exam: Physical Exam Constitutional:      Appearance: Normal appearance. He is ill-appearing.  HENT:     Head: Normocephalic.     Nose: Nose normal. No congestion or rhinorrhea.  Eyes:     Pupils: Pupils are equal, round, and reactive to light.  Pulmonary:     Effort: Pulmonary effort is  normal. No respiratory distress.  Musculoskeletal:        General: Normal range of motion.     Cervical back: Normal range of motion.  Neurological:     Mental Status: He is oriented to person, place, and time.     Sensory: No sensory deficit.  Psychiatric:        Attention and Perception: He does not perceive auditory or visual hallucinations.        Mood and Affect: Mood is depressed. Affect is flat.        Speech: Speech normal.        Behavior: Behavior is cooperative.        Thought Content: Thought content is not paranoid or delusional. Thought content does not include homicidal ideation. Thought content does not include homicidal or suicidal plan.        Cognition and Memory: Cognition and memory normal.        Judgment: Judgment normal.    Review of Systems  Constitutional:  Negative for fever.  HENT: Negative.    Eyes:  Negative for blurred vision.  Respiratory: Negative.  Negative for cough.   Cardiovascular:  Negative for chest pain.  Gastrointestinal: Negative.   Genitourinary: Negative.   Skin: Negative.   Neurological:  Negative for dizziness.  Psychiatric/Behavioral:  Positive for depression and substance abuse. Negative for hallucinations, memory loss and suicidal ideas.    Blood pressure (!) 103/59, pulse 84, temperature 97.9 F (36.6 C), temperature source Oral, resp. rate 16, height 6\' 5"  (1.956 m), weight 79.4 kg, SpO2 100 %. Body mass index is 20.75 kg/m.  Treatment Plan Summary: Treatment Plan Summary: Daily contact with patient to assess and evaluate symptoms and progress in treatment and Medication management  Observation Level/Precautions:  15 minute checks  Laboratory:  Labs reviewed   Psychotherapy:  Unit Group sessions  Medications:  See [facility]  Consultations:  To be determined   Discharge Concerns:  Safety, medication compliance, mood stability  Estimated LOS: 5-7 days  Other:  N/A   Labs reviewed on 03/03/2022: RPR reactive, ID recommendations  as above.  HSV pending, CD4 absolute count low at 85.  BMP, and CBC reviewed, UA pending, orders placed for lipid panel, TSH, and hemoglobin A1c. EKG ordered.  PLAN Safety and Monitoring:  Voluntary admission to inpatient psychiatric unit for safety, stabilization and treatment Daily contact with patient to assess and evaluate symptoms and progress in treatment Patient's case to be discussed in multi-disciplinary team meeting Observation Level : q15 minute checks Vital signs: q12 hours Precautions: Safety  Long Term Goal(s): Improvement in symptoms so as ready for discharge  Short Term Goals: Ability to identify changes in lifestyle to reduce recurrence of condition will improve, Ability to disclose and discuss suicidal ideas, Ability to identify and develop effective coping behaviors will improve, Compliance with prescribed medications will improve, and Ability to identify triggers associated with substance abuse/mental health issues will improve  Diagnoses:  Principal Problem:   Major depressive disorder, recurrent severe without psychotic features ([place]) Active Problems:   HIV disease ([place])   Methamphetamine abuse ([place])   Suicide ideation  Medications Major depressive disorder, recurrent severe without psychotic features ([place])    - Continue:  - Remeron 15 mg nightly for MDD, sleep and appetite stimulation  HIV disease ([place]):    - Continue:   - Biktarvy daily   - Diflucan 400 mg daily - Dapsone 100 Mg Daily     - Consulted with on-call ID Baxter Flattery, MD; however pt overall status appears much  improved. Upcoming outpatient appointment scheduled 03/13/22.   HSV I, II:     - Start:   - Valcyclovir 1000 mg BID for outbreak management     - Continue:  - Gabapentin 300 mg 3 times daily for anxiety  - Agitation protocol-Haldol 5 mg/Ativan 2 mg/Benadryl 50 mg as needed 3 times daily  Patient educated on rationales, benefits and possible side affects of all medications and verbalizes  understanding.  Spoke with ID, see note above regarding follow-up after discharge.  Other PRNS -Continue Tylenol 650 mg every 6 hours PRN for mild pain -Continue Maalox 30 mg every 4 hrs PRN for indigestion -Continue Milk of Magnesia as needed every 6 hrs for constipation  Discharge Planning: Social work and case management to assist with discharge planning and identification of hospital follow-up needs prior to discharge Estimated LOS: 5-7 days Discharge Concerns: Need to establish a safety plan; Medication compliance and effectiveness Discharge Goals: Return home with outpatient referrals for mental health follow-up including medication management/psychotherapy  I certify that inpatient services furnished can reasonably be expected to improve the patient's condition.    Inda Merlin, NP 11/12/202311:11 AM   Patient ID: Michael Chandler, male   DOB: [birth year], 36 y.o.   MRN: [id number]

## 2022-03-05 NOTE — Progress Notes (Signed)
ID PROGRESS NOTE    Michael Chandler is a 36yo M with history of advanced HIV disease, CD 4 count of 85 (nov 2023) /VL 174,000 (August 2023) currently started on biktarvy and dapsone and valtrex for oi proph. He is homeless and currently hospitalized at [facility].where he is being treated for MDD, SI, and meth use. He continues to participate in group. Appears more interactive per Dr Belia Heman notes.  He was hospitalized at [facility] in late August, where he disclosed he was previously on symtuza. Hx of hiv dx in 2012 and syphilis in 2018, received care in [place] in the past but now in [place]. He has not re-established care in an HIV clinic.  Work up thus far shows: HSV serology +, RPR stable at 1:2.   Current Facility-Administered Medications:    acetaminophen (TYLENOL) tablet 650 mg, 650 mg, Oral, Q6H PRN, Welford Roche, Josephine C, NP, 650 mg at 03/02/22 0656   alum & mag hydroxide-simeth (MAALOX/MYLANTA) 200-200-20 MG/5ML suspension 30 mL, 30 mL, Oral, Q4H PRN, Welford Roche, Josephine C, NP   bictegravir-emtricitabine-tenofovir AF (BIKTARVY) 50-200-25 MG per tablet 1 tablet, 1 tablet, Oral, Daily, Leevy-Johnson, Brooke A, NP, 1 tablet at 03/05/22 0947   dapsone tablet 100 mg, 100 mg, Oral, Daily, Leevy-Johnson, Brooke A, NP, 100 mg at 03/05/22 0947   dextromethorphan-guaiFENesin ([place]UCIN[place] DM) 30-600 MG per 12 hr tablet 1 tablet, 1 tablet, Oral, BID PRN, Nwoko, Uchenna E, PA, 1 tablet at 03/04/22 1708   haloperidol (HALDOL) tablet 5 mg, 5 mg, Oral, TID PRN **AND** LORazepam (ATIVAN) tablet 2 mg, 2 mg, Oral, TID PRN **AND** diphenhydrAMINE (BENADRYL) capsule 50 mg, 50 mg, Oral, TID PRN, Massengill, Harrold Donath, MD   haloperidol lactate (HALDOL) injection 5 mg, 5 mg, Intramuscular, TID PRN **AND** LORazepam (ATIVAN) injection 2 mg, 2 mg, Intramuscular, TID PRN **AND** diphenhydrAMINE (BENADRYL) injection 50 mg, 50 mg, Intramuscular, TID PRN, Massengill, Nathan, MD   fluconazole (DIFLUCAN) tablet 400 mg, 400 mg, Oral, Daily,  Leevy-Johnson, Brooke A, NP, 400 mg at 03/05/22 0947   gabapentin (NEURONTIN) capsule 300 mg, 300 mg, Oral, TID, [place], Josephine C, NP, 300 mg at 03/05/22 1514   magnesium hydroxide (MILK OF MAGNESIA) suspension 30 mL, 30 mL, Oral, Daily PRN, Welford Roche, Josephine C, NP   mirtazapine (REMERON SOL-TAB) disintegrating tablet 15 mg, 15 mg, Oral, QHS, Massengill, Nathan, MD, 15 mg at 03/04/22 2054   polyethylene glycol (MIRALAX / GLYCOLAX) packet 17 g, 17 g, Oral, Daily, Nkwenti, Doris, NP, 17 g at 03/04/22 0801   valACYclovir (VALTREX) tablet 1,000 mg, 1,000 mg, Oral, BID, Leevy-Johnson, Brooke A, NP, 1,000 mg at 03/05/22 0947    A/P: 36yo M with advanced HIV disease, started back on ART while hospitalized at [facility].   - he has appt at ID clinic on 11/20 however we will see if can start insurance process where he can get access to medications prior to discharge from [facility]. - we will see if clinic social worker can come get info from Michael Chandler and see how to arrange transportation to clinic -can give community resources for shelters and food pantries in the community - in terms of homelessness = we can have him meet coordinators to get on the list for housing but there is no immediate placement that can be done for Michael Chandler unfortunately - we will see if can get all his medications in hand for HIV and oi proph (dapsone and valtrex) prior to discharge - encourage to give him pneumonia and flu vaccine prior to discharge -  if patient appears clinically unstable and requires admission to [facility], please contact triad hospitalist for assessment and transfer  Dr Earlene Plater to provide further recs

## 2022-03-05 NOTE — Progress Notes (Signed)
    03/05/22 2030  Psych Admission Type (Psych Patients Only)  Admission Status Voluntary  Psychosocial Assessment  Patient Complaints Depression  Eye Contact Fair  Facial Expression Flat  Affect Depressed  Speech Soft;Logical/coherent  Interaction Guarded;Isolative  Motor Activity Slow  Appearance/Hygiene Disheveled  Behavior Characteristics Cooperative;Calm;Guarded  Mood Pleasant;Depressed;Sad  Thought Process  Coherency WDL  Content WDL  Delusions None reported or observed  Perception WDL  Hallucination None reported or observed  Judgment Poor  Confusion None  Danger to Self  Current suicidal ideation? Denies  Agreement Not to Harm Self Yes  Description of Agreement verbal  Danger to Others  Danger to Others None reported or observed

## 2022-03-05 NOTE — Progress Notes (Signed)
   03/05/22 1200  Psych Admission Type (Psych Patients Only)  Admission Status Voluntary  Psychosocial Assessment  Patient Complaints Depression;Hopelessness  Eye Contact Fair  Facial Expression Flat  Affect Flat  Speech Logical/coherent;Soft  Interaction Guarded;Isolative  Motor Activity Slow  Appearance/Hygiene Disheveled  Behavior Characteristics Cooperative;Calm;Guarded  Mood Depressed;Sad  Thought Process  Coherency WDL  Content WDL  Delusions None reported or observed  Perception WDL  Hallucination None reported or observed  Judgment Poor  Confusion None  Danger to Self  Current suicidal ideation? Denies  Agreement Not to Harm Self Yes  Description of Agreement verbal  Danger to Others  Danger to Others None reported or observed

## 2022-03-05 NOTE — [facility] Group Notes (Signed)
Adult Psychoeducational Group Note Date:  03/05/2022 Time:  0900-1000 Group Topic/Focus: PROGRESSIVE RELAXATION. A group where deep breathing is taught and tensing and relaxation muscle groups is used. Imagery is used as well.  Pts are asked to imagine 3 pillars that hold them up when they are not able to hold themselves up and to share that with the group.   Participation Level: did not attend   : [name]   

## 2022-03-05 NOTE — Plan of Care (Signed)
  Problem: Education: Goal: Mental status will improve Outcome: Progressing   Problem: Medication: Goal: Compliance with prescribed medication regimen will improve Outcome: Progressing   Problem: Education: Goal: Emotional status will improve Outcome: Not Progressing

## 2022-03-05 NOTE — [facility] Group Notes (Signed)
Adult Psychoeducational Group  Date:  03/05/2022 Time:  1300-1400  Group Topic/Focus: Continuation of the group from Saturday. Looking at the lists that were created and talking about what needs to be done with the homework of 30 positives about themselves.                                     Talking about taking their power back and helping themselves to develop a positive self esteem.      Participation Quality:  did ot attend  Dione Housekeeper

## 2022-03-06 NOTE — Progress Notes (Signed)
     03/06/22 2122  Psych Admission Type (Psych Patients Only)  Admission Status Voluntary  Psychosocial Assessment  Patient Complaints Depression  Eye Contact Fair  Facial Expression Flat  Affect Depressed  Speech Logical/coherent  Interaction Guarded  Motor Activity Slow  Appearance/Hygiene Disheveled  Behavior Characteristics Cooperative;Appropriate to situation;Calm  Mood Pleasant;Depressed  Thought Process  Coherency WDL  Content WDL  Delusions None reported or observed  Perception WDL  Hallucination None reported or observed  Judgment WDL  Confusion WDL  Danger to Self  Current suicidal ideation? Denies  Agreement Not to Harm Self Yes  Description of Agreement verbal  Danger to Others  Danger to Others None reported or observed
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
## 2022-03-06 NOTE — Group Note (Signed)
Recreation Therapy Group Note   Group Topic:Stress Management  Group Date: 03/06/2022 Start Time: 0930 End Time: 0950 Facilitators: Tahani Potier-McCall, LRT,CTRS Location: [address] Dayroom   Goal Area(s) Addresses:  Patient will identify positive stress management techniques. Patient will identify benefits of using stress management post d/c.  Group Description: Meditation.  LRT and patients discussed the importance of taking time out for one's self.  LRT played a meditation that focused choices.  The meditation focused on choosing to change the thought process when negative thinking creeps into the mind.  Patients were to listen and follow along as the meditation played to gain clarity on the importance of being aware of the thoughts we have.    Affect/Mood: N/A   Participation Level: Did not attend    Clinical Observations/Individualized Feedback:     Plan: Continue to engage patient in RT group sessions 2-3x/week.   Lekha Dancer-McCall, LRT,CTRS  03/06/2022 11:48 AM

## 2022-03-06 NOTE — Progress Notes (Signed)
   03/06/22 0800  Psych Admission Type (Psych Patients Only)  Admission Status Voluntary  Psychosocial Assessment  Patient Complaints Depression  Eye Contact Fair  Facial Expression Flat  Affect Depressed  Speech Logical/coherent  Interaction Guarded;Isolative  Motor Activity Slow  Appearance/Hygiene Disheveled  Behavior Characteristics Cooperative;Appropriate to situation  Aggressive Behavior  Targets Other (Comment) (No aggressive behavior)  Type of Behavior Verbal  Effect No apparent injury  Thought Process  Coherency WDL  Content WDL  Delusions WDL  Perception WDL  Hallucination None reported or observed  Judgment WDL  Confusion WDL  Danger to Self  Current suicidal ideation? Denies  Agreement Not to Harm Self Yes  Description of Agreement verbal  Danger to Others  Danger to Others None reported or observed

## 2022-03-06 NOTE — Progress Notes (Signed)
[facility]  03/06/2022 3:29 PM Michael Chandler  MRN:  [id number] Subjective:   Michael Chandler is a 36 yr old male [facility] depression and SI, [facility].  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  PMHx is significant for HIV.   Case was discussed in the multidisciplinary team. MAR was reviewed and patient was compliant with medications except for his Miralax.  He received PRN Trazodone last night.   Psychiatric Team made the following recommendations yesterday: -Continue Remeron 15 mg QHS for depression, sleep, and appetite -Continue Gabapentin 300 mg TID for anxiety -Continue Agitation Protocol: Haldol/Ativan/Benadryl -Continue Biktarvy 50-200-25 mg daily -Continue Dapsone 100 mg daily -Continue Diflucan 400 mg daily -Continue Valtrex 1000 mg BID for 10 doses (Day 2 of 5)    On interview today patient reports he slept good last night.  He reports his appetite is doing good.  He reports no SI, HI, or AVH.  He reports no Paranoia, Ideas of Reference, or other First Rank symptoms.  He reports no issues with his medications.  He reports that he is feeling much better and is ready to discharge tomorrow.  When asked where he will go he reports that he will be able to stay with his mother.  When asked if he would allow Social Work to contact he said he would.  He then reports that he still has a hotel room that he could go to.  He reports no other concerns at present.  Principal Problem: Major depressive disorder, recurrent severe without psychotic features (HCC) Diagnosis: Principal Problem:   Major depressive disorder, recurrent severe without psychotic features (HCC) Active Problems:   Methamphetamine abuse (HCC)   HIV disease (HCC)   Suicide ideation  Total Time spent with patient:  I personally spent 35 minutes on the unit in  direct patient care. The direct patient care time included face-to-face time with the patient, reviewing the patient's chart, communicating with other professionals, and coordinating care. Greater than 50% of this time was spent in counseling or coordinating care with the patient regarding goals of hospitalization, psycho-education, and discharge planning needs.   Past Psychiatric History: Schizophrenia and Amphetamine Abuse, and has prior Psychiatric Hospitalizations, and no reported Suicide Attempts.  PMHx is significant for HIV.  Past Medical History:  Past Medical History:  Diagnosis Date   HIV (human immunodeficiency virus infection) (HCC)    Schizophrenia (HCC)    per IVC paperwork, pt states does not have this dx   History reviewed. No pertinent surgical history. Family History: History reviewed. No pertinent family history. Family Psychiatric  History: Reports None Social History:  Social History   Substance and Sexual Activity  Alcohol Use Yes     Social History   Substance and Sexual Activity  Drug Use Yes   Types: Methamphetamines, Marijuana, Cocaine   Comment: pt denies at this time    Social History   Socioeconomic History   Marital status: Single    Spouse name: Not on file   Number of children: Not on file   Years of education: Not on file   Highest education level: Not on file  Occupational History   Not on file  Tobacco Use   Smoking status: Every Day   Smokeless tobacco: Never  Substance and Sexual Activity   Alcohol use: Yes   Drug use: Yes  Types: Methamphetamines, Marijuana, Cocaine    Comment: pt denies at this time   Sexual activity: Not Currently  Other Topics Concern   Not on file  Social History Narrative   Not on file   Social Determinants of Health   Financial Resource Strain: Not on file  Food Insecurity: Food Insecurity Present (03/02/2022)   Hunger Vital Sign    Worried About Running Out of Food in the Last Year: Not on file     Ran Out of Food in the Last Year: Sometimes true  Transportation Needs: Not on file  Physical Activity: Not on file  Stress: Not on file  Social Connections: Not on file   Additional Social History:                         Sleep: Good  Appetite:  Good  Current Medications: Current Facility-Administered Medications  Medication Dose Route Frequency Provider Last Rate Last Admin   acetaminophen (TYLENOL) tablet 650 mg  650 mg Oral Q6H PRN Dahlia Byes C, NP   650 mg at 03/02/22 0656   alum & mag hydroxide-simeth (MAALOX/MYLANTA) 200-200-20 MG/5ML suspension 30 mL  30 mL Oral Q4H PRN Dahlia Byes C, NP   30 mL at 03/06/22 0933   bictegravir-emtricitabine-tenofovir AF (BIKTARVY) 50-200-25 MG per tablet 1 tablet  1 tablet Oral Daily Leevy-Johnson, Brooke A, NP   1 tablet at 03/06/22 0932   dapsone tablet 100 mg  100 mg Oral Daily Leevy-Johnson, Brooke A, NP   100 mg at 03/06/22 0931   dextromethorphan-guaiFENesin (MUCINEX DM) 30-600 MG per 12 hr tablet 1 tablet  1 tablet Oral BID PRN Nwoko, Uchenna E, PA   1 tablet at 03/04/22 1708   haloperidol (HALDOL) tablet 5 mg  5 mg Oral TID PRN Phineas Inches, MD       And   LORazepam (ATIVAN) tablet 2 mg  2 mg Oral TID PRN Massengill, Harrold Donath, MD       And   diphenhydrAMINE (BENADRYL) capsule 50 mg  50 mg Oral TID PRN Massengill, Harrold Donath, MD       haloperidol lactate (HALDOL) injection 5 mg  5 mg Intramuscular TID PRN Massengill, Harrold Donath, MD       And   LORazepam (ATIVAN) injection 2 mg  2 mg Intramuscular TID PRN Massengill, Harrold Donath, MD       And   diphenhydrAMINE (BENADRYL) injection 50 mg  50 mg Intramuscular TID PRN Massengill, Harrold Donath, MD       fluconazole (DIFLUCAN) tablet 400 mg  400 mg Oral Daily Leevy-Johnson, Brooke A, NP   400 mg at 03/06/22 0931   gabapentin (NEURONTIN) capsule 300 mg  300 mg Oral TID Dahlia Byes C, NP   300 mg at 03/06/22 0931   magnesium hydroxide (MILK OF MAGNESIA) suspension 30 mL  30 mL  Oral Daily PRN Dahlia Byes C, NP       mirtazapine (REMERON SOL-TAB) disintegrating tablet 15 mg  15 mg Oral QHS Massengill, Harrold Donath, MD   15 mg at 03/05/22 2119   polyethylene glycol (MIRALAX / GLYCOLAX) packet 17 g  17 g Oral Daily Nkwenti, Doris, NP   17 g at 03/04/22 0801   traZODone (DESYREL) tablet 50 mg  50 mg Oral QHS PRN Bobbitt, Shalon E, NP   50 mg at 03/05/22 2344   valACYclovir (VALTREX) tablet 1,000 mg  1,000 mg Oral BID Leevy-Johnson, Brooke A, NP   1,000 mg at 03/06/22 [phone]  Lab Results: No results found for this or any previous visit (from the past 48 hour(s)).  Blood Alcohol level:  Lab Results  Component Value Date   ETH <10 02/28/2022   ETH <10 12/16/2021    Metabolic Disorder Labs: Lab Results  Component Value Date   HGBA1C 5.7 (H) 03/04/2022   MPG 116.89 03/04/2022   MPG 131.24 12/18/2021   No results found for: "PROLACTIN" Lab Results  Component Value Date   CHOL 166 03/04/2022   TRIG 248 (H) 03/04/2022   HDL 27 (L) 03/04/2022   CHOLHDL 6.1 03/04/2022   VLDL 50 (H) 03/04/2022   LDLCALC 89 03/04/2022   LDLCALC 50 12/18/2021    Physical Findings: AIMS:  , ,  ,  ,    CIWA:    COWS:     Musculoskeletal: Strength & Muscle Tone: within normal limits Gait & Station: normal Patient leans: N/A  Psychiatric Specialty Exam:  Presentation  General Appearance:  Casual  Eye Contact: Fair  Speech: Clear and Coherent  Speech Volume: Normal  Handedness: Right   Mood and Affect  Mood: -- ("ok")  Affect: Congruent (still some guarding)   Thought Process  Thought Processes: Coherent  Descriptions of Associations:Intact  Orientation:Full (Time, Place and Person)  Thought Content:Logical; WDL  History of Schizophrenia/Schizoaffective disorder:Yes  Duration of Psychotic Symptoms:Greater than six months  Hallucinations:Hallucinations: None  Ideas of Reference:None  Suicidal Thoughts:Suicidal Thoughts: No  Homicidal  Thoughts:Homicidal Thoughts: No   Sensorium  Memory: Immediate Fair; Recent Fair  Judgment: Fair  Insight: Present   Executive Functions  Concentration: Fair  Attention Span: Fair  Recall: [employer] of Knowledge: Fair  Language: Fair   Psychomotor Activity  Psychomotor Activity: Psychomotor Activity: Normal   Assets  Assets: Communication Skills; Desire for Improvement; Resilience; Social Support   Sleep  Sleep: Sleep: Good    Physical Exam: Physical Exam Vitals and nursing note reviewed.  Constitutional:      General: He is not in acute distress.    Appearance: Normal appearance. He is normal weight. He is not ill-appearing or toxic-appearing.  HENT:     Head: Normocephalic and atraumatic.  Pulmonary:     Effort: Pulmonary effort is normal.  Neurological:     General: No focal deficit present.     Mental Status: He is alert.    Review of Systems  Respiratory:  Negative for cough and shortness of breath.   Cardiovascular:  Negative for chest pain.  Gastrointestinal:  Negative for abdominal pain, constipation, diarrhea, nausea and vomiting.  Neurological:  Negative for dizziness, weakness and headaches.  Psychiatric/Behavioral:  Negative for depression, hallucinations and suicidal ideas. The patient is not nervous/anxious.    Blood pressure (!) 98/48, pulse (!) 105, temperature 97.6 F (36.4 C), resp. rate 16, height 6\' 5"  (1.956 m), weight 79.4 kg, SpO2 92 %. Body mass index is 20.75 kg/m.   Treatment Plan Summary: Daily contact with patient to assess and evaluate symptoms and progress in treatment and Medication management  Michael Chandler is a 36 yr old male who presented to [facility] on 11/7 for worsening depression and SI, he was admitted to [facility] on 11/9.  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  PMHx is significant for HIV.   Meagan has  tolerated starting his medications well without issue.  He has ID follow up appointment scheduled.  He reported his mother would be able to pick him  up, however, his mother reported he would not be able to stay with her at this time.  He reports having a hotel room that he can go back to at discharge.  We will not make any changes to his medications at this time.  If he continues to be stable could consider discharge tomorrow.  We will continue to monitor.   MDD, Recurrent, Severe w/out psychosis: -Continue Remeron 15 mg QHS for depression, sleep, and appetite -Continue Gabapentin 300 mg TID for anxiety -Continue Agitation Protocol: Haldol/Ativan/Benadryl   HIV: -Continue Biktarvy 50-200-25 mg daily -Continue Dapsone 100 mg daily -Continue Diflucan 400 mg daily -Outpatient ID appointment scheduled 03/13/2022   HSV: -Continue Valtrex 1000 mg BID for 10 doses (Day 3 of 5)   -Continue Mucinex DM 30-600 mg BID PRN -Continue PRN's: Tylenol, Maalox, Milk of Magnesia, Trazodone    Lauro Franklin, MD 03/06/2022, 3:29 PM

## 2022-03-06 NOTE — Group Note (Signed)
LCSW Group Therapy Note   Group Date: 03/06/2022 Start Time: 1300 End Time: 1400  Type of Therapy and Topic:  Group Therapy - Healthy vs Unhealthy Coping Skills  Participation Level:  Active   Description of Group The focus of this group was to determine what unhealthy coping techniques typically are used by group members and what healthy coping techniques would be helpful in coping with various problems. Patients were guided in becoming aware of the differences between healthy and unhealthy coping techniques. Patients were asked to identify 2-3 healthy coping skills they would like to learn to use more effectively.  Therapeutic Goals Patients learned that coping is what human beings do all day long to deal with various situations in their lives Patients defined and discussed healthy vs unhealthy coping techniques Patients identified their preferred coping techniques and identified whether these were healthy or unhealthy Patients determined 2-3 healthy coping skills they would like to become more familiar with and use more often. Patients provided support and ideas to each other   Summary of Patient Progress:  The Pt attended group and remained there the entire time.  The Pt accepted all worksheets and was able to demonstrate understanding of the topic being discussed.  The Pt was able to engage in discussion with their peers and was appropriate with their peers.    Therapeutic Modalities Cognitive Behavioral Therapy Motivational Interviewing  Jodi Criscuolo M Rie Mcneil, LCSWA 03/06/2022  1:47 PM    

## 2022-03-06 NOTE — [facility] Group Notes (Signed)
Adult Psychoeducational Group Note  Date:  03/06/2022 Time:  9:18 AM  Group Topic/Focus:  Goals Group:   The focus of this group is to help patients establish daily goals to achieve during treatment and discuss how the patient can incorporate goal setting into their daily lives to aide in recovery.  Participation Level:  Did Not Attend    Donell Beers 03/06/2022, 9:18 AM

## 2022-03-06 NOTE — Plan of Care (Signed)
  Problem: Education: Goal: Emotional status will improve Outcome: Progressing Goal: Mental status will improve Outcome: Progressing Goal: Verbalization of understanding the information provided will improve Outcome: Progressing   Problem: Activity: Goal: Interest or engagement in leisure activities will improve Outcome: Progressing   Problem: Medication: Goal: Compliance with prescribed medication regimen will improve Outcome: Progressing

## 2022-03-06 NOTE — Plan of Care (Signed)
  Problem: Medication: Goal: Compliance with prescribed medication regimen will improve Outcome: Progressing   Problem: Education: Goal: Ability to state activities that reduce stress will improve Outcome: Progressing

## 2022-03-07 MED ORDER — MIRTAZAPINE 15 MG PO TBDP: 15.0000 mg | ORAL_TABLET | Freq: Every day | ORAL | 0 refills | Status: DC

## 2022-03-07 MED ORDER — FLUCONAZOLE 200 MG PO TABS: 400.0000 mg | ORAL_TABLET | Freq: Every day | ORAL | 0 refills | Status: DC

## 2022-03-07 MED ORDER — TRAZODONE HCL 150 MG PO TABS: 150.0000 mg | ORAL_TABLET | Freq: Every evening | ORAL | Status: DC | PRN

## 2022-03-07 MED ORDER — VALACYCLOVIR HCL 1 G PO TABS: 1000.0000 mg | ORAL_TABLET | Freq: Two times a day (BID) | ORAL | 0 refills | Status: DC

## 2022-03-07 MED ORDER — HALOPERIDOL 5 MG PO TABS
5.0000 mg | ORAL_TABLET | Freq: Two times a day (BID) | ORAL | Status: DC
  Filled 2022-03-07 (×2): qty 14

## 2022-03-07 MED ORDER — WHITE PETROLATUM EX OINT
TOPICAL_OINTMENT | CUTANEOUS | Status: AC
  Filled 2022-03-07: qty 5

## 2022-03-07 MED ORDER — BENZTROPINE MESYLATE 1 MG PO TABS
1.0000 mg | ORAL_TABLET | Freq: Every day | ORAL | Status: DC
  Filled 2022-03-07 (×2): qty 7

## 2022-03-07 MED ORDER — BICTEGRAVIR-EMTRICITAB-TENOFOV 50-200-25 MG PO TABS: 1.0000 | ORAL_TABLET | Freq: Every day | ORAL | 0 refills | Status: DC

## 2022-03-07 NOTE — Discharge Summary (Addendum)
Physician Discharge Summary Note  Patient:  Michael Chandler is an 36 y.o., male MRN:  [id number] DOB:  [birth year] Patient phone:  [phone] (home)  Patient address:   [place] [address],  Total Time spent with patient: 30 minutes  Date of Admission:  03/02/2022 Date of Discharge: 03/07/2022  Reason for Admission:  Macarthur Henige 36 year old male with past psychiatric history of schizophrenia (per chart), methamphetamine abuse who presented to [facility] via EMS for SI. He reports originally being from [place] and mother moved to [place] area years ago. Patient reported to have just flown in from [place] where he reported, "I am ready to die". Per chart review, patient recently seen in [facility] [place] for atypical PNA (02/14/22); he was discharge with 3 month supply of medication where he reported returning to [facility] and declining any follow up referrals for PCP. Seen 02/05/22 [facility] for depression and suicidal ideations, 01/30/22 [facility] for depression, [facility] 12/17/21 depression. Per Care Everywhere patient has records in [place], [place] and [place]. Medical history of HIV/AIDS, syphilis with medication non-compliance; notes he was diagnosed with HIV 2012 and refused treatment due to "something I think I can beat". Completed 11th grade. Last worked in 2020 at [employer]; reports currently waiting for housing authority assistance. Reports currently living in hotels and the streets on his SSDi income.   24 hr chart review: Patient ambulatory unit. No groups attended. Noted to have disheveled appearance. Improved memory. C/o post nasal drip on yesterday where he was prescribed PRN Mucinex. Vital signs improving; HR 139. Increased visibility outside of his room this shift. Improved PO intake. Medication compliant.   Assessment: Patient observed in room where is reports being "ready for discharge at 11 am to the [facility]". Casual  appearance. Improvement from days prior. Alert and oriented. Rates depression and anxiety as 0/10. Denies any thoughts of wanting to harm himself or anyone, auditory or visual hallucinations. Discussed plan to remain on anti-viral medications due to him "never being this sick before". States he is "not ready to die until it's time and doesn't feel it's time". Reports plan to stay in the area to reconnect to services; currently #11 on [facility] section 8 waiting list. Reports plan to follow up with outpatient Infectious Disease appointments. States he has access to his debit card. Verbalized an understanding of current discharge plan and denies any safety concerns at this time. Pharmacy to provide 7 day supply of both medical and psychotropic medications, written prescriptions given for 10 day supply. Plan to discharge via taxi to [facility].   Principal Problem: Major depressive disorder, recurrent severe without psychotic features [facility]) Discharge Diagnoses: Principal Problem:   Major depressive disorder, recurrent severe without psychotic features ([place]) Active Problems:   HIV disease ([place])   Methamphetamine abuse ([place])   Suicide ideation  Past Psychiatric History: polysubstance abuse (methamphetamine, cocaine, marijuana), MDD  Past Medical History:  Past Medical History:  Diagnosis Date   HIV (human immunodeficiency virus infection) ([place])    Schizophrenia ([place])    per IVC paperwork, pt states does not have this dx   History reviewed. No pertinent surgical history. Family History: History reviewed. No pertinent family history. Family Psychiatric  History: not noted Social History:  Social History   Substance and Sexual Activity  Alcohol Use Yes     Social History   Substance and Sexual Activity  Drug Use Yes   Types: Methamphetamines, Marijuana, Cocaine  Comment: pt denies at this time    Social History   Socioeconomic History   Marital status: Single    Spouse name: Not on file    Number of children: Not on file   Years of education: Not on file   Highest education level: Not on file  Occupational History   Not on file  Tobacco Use   Smoking status: Every Day   Smokeless tobacco: Never  Substance and Sexual Activity   Alcohol use: Yes   Drug use: Yes    Types: Methamphetamines, Marijuana, Cocaine    Comment: pt denies at this time   Sexual activity: Not Currently  Other Topics Concern   Not on file  Social History Narrative   Not on file   Social Determinants of Health   Financial Resource Strain: Not on file  Food Insecurity: Food Insecurity Present (03/02/2022)   Hunger Vital Sign    Worried About Running Out of Food in the Last Year: Not on file    Ran Out of Food in the Last Year: Sometimes true  Transportation Needs: Not on file  Physical Activity: Not on file  Stress: Not on file  Social Connections: Not on file   Hospital Course: HOSPITAL COURSE:  During the patient's hospitalization, patient had extensive initial psychiatric evaluation, and follow-up psychiatric evaluations every day.  Psychiatric diagnoses provided upon initial assessment: major depressive disorder, recurrent severe without psychotic features, suicidal ideation, methamphetamine abuse  Patient's psychiatric medications were adjusted on admission: Started on Mirtazepine 15 mg daily HS for MDD, sleep, and appetite stimulation  During the hospitalization, other adjustments were made to the patient's psychiatric medication regimen: Patient restarted on Biktarvy anti-viral management, Dapsone 100 mg daily PCP prophylaxis, Diflucan 400 mg daily oral thrush  Patient's care was discussed during the interdisciplinary team meeting every day during the hospitalization.  The patient denies having side effects to prescribed psychiatric medication.  Gradually, patient started adjusting to milieu. The patient was evaluated each day by a clinical provider to ascertain response to treatment.  Improvement was noted by the patient's report of decreasing symptoms, improved sleep and appetite, affect, medication tolerance, behavior, and participation in unit programming.  Patient was asked each day to complete a self inventory noting mood, mental status, pain, new symptoms, anxiety and concerns.    Symptoms were reported as significantly decreased or resolved completely by discharge.   On day of discharge, the patient reports that their mood is stable. The patient denied having suicidal thoughts for more than 48 hours prior to discharge.  Patient denies having homicidal thoughts.  Patient denies having auditory hallucinations.  Patient denies any visual hallucinations or other symptoms of psychosis. The patient was motivated to continue taking medication with a goal of continued improvement in mental health.   The patient reports their target psychiatric symptoms of depression responded well to the psychiatric medications, and the patient reports overall benefit other psychiatric hospitalization. Supportive psychotherapy was provided to the patient. The patient also participated in regular group therapy while hospitalized. Coping skills, problem solving as well as relaxation therapies were also part of the unit programming.  Labs were reviewed with the patient, and abnormal results were discussed with the patient.  The patient is able to verbalize their individual safety plan to this provider.  - It is recommended to the patient to continue psychiatric medications as prescribed, after discharge from the hospital.    - It is recommended to the patient to follow up with your  outpatient psychiatric provider and PCP.  - It was discussed with the patient, the impact of alcohol, drugs, tobacco have been there overall psychiatric and medical wellbeing, and total abstinence from substance use was recommended the patient.ed.  - Prescriptions provided or sent directly to preferred pharmacy at  discharge. Patient agreeable to plan. Given opportunity to ask questions. Appears to feel comfortable with discharge.    - In the event of worsening symptoms, the patient is instructed to call the crisis hotline, 911 and or go to the nearest ED for appropriate evaluation and treatment of symptoms. To follow-up with primary care provider for other medical issues, concerns and or health care needs  - Patient was discharged to [facility] with a plan to follow up as noted below.   Physical Findings: AIMS:  , ,  ,  ,   0 CIWA:   0 COWS:   0  Musculoskeletal: Strength & Muscle Tone: within normal limits Gait & Station: normal Patient leans: N/A  Psychiatric Specialty Exam:  Presentation  General Appearance:  Casual  Eye Contact: Fair  Speech: Clear and Coherent  Speech Volume: Normal  Handedness: Right  Mood and Affect  Mood: Euthymic  Affect: Congruent  Thought Process  Thought Processes: Coherent  Descriptions of Associations:Intact  Orientation:Full (Time, Place and Person)  Thought Content:Logical; WDL  History of Schizophrenia/Schizoaffective disorder:Yes  Duration of Psychotic Symptoms:Greater than six months  Hallucinations:Hallucinations: None  Ideas of Reference:None  Suicidal Thoughts:Suicidal Thoughts: No  Homicidal Thoughts:Homicidal Thoughts: No  Sensorium  Memory: Immediate Fair; Recent Fair  Judgment: Fair  Insight: Present  Executive Functions  Concentration: Fair  Attention Span: Fair  Recall: [employer] of Knowledge: Fair  Language: Fair  Psychomotor Activity  Psychomotor Activity: Psychomotor Activity: Normal  Assets  Assets: Communication Skills; Desire for Improvement; Physical Health; Social Support; Resilience; Vocational/Educational  Sleep  Sleep: Sleep: Good  Physical Exam: Physical Exam Vitals and nursing note reviewed.  Constitutional:      Appearance: He is ill-appearing.  HENT:     Head:  Normocephalic.     Nose: Nose normal.     Mouth/Throat:     Mouth: Mucous membranes are moist.     Pharynx: Oropharynx is clear.  Eyes:     Pupils: Pupils are equal, round, and reactive to light.  Cardiovascular:     Rate and Rhythm: Tachycardia present.     Pulses: Normal pulses.  Pulmonary:     Effort: Pulmonary effort is normal.  Abdominal:     General: Abdomen is flat.  Musculoskeletal:        General: Normal range of motion.     Cervical back: Normal range of motion.  Skin:    General: Skin is warm and dry.  Neurological:     Mental Status: He is alert. Mental status is at baseline.  Psychiatric:        Attention and Perception: Attention and perception normal.        Mood and Affect: Mood and affect normal.        Speech: Speech normal.        Behavior: Behavior normal. Behavior is cooperative.        Thought Content: Thought content normal.        Cognition and Memory: Cognition and memory normal.        Judgment: Judgment normal.    Review of Systems  Psychiatric/Behavioral:  Positive for substance abuse. Negative for depression and suicidal ideas.   All other systems  reviewed and are negative.  Blood pressure 104/61, pulse (!) 139, temperature 97.6 F (36.4 C), resp. rate 16, height 6\' 5"  (1.956 m), weight 79.4 kg, SpO2 100 %. Body mass index is 20.75 kg/m.   Social History   Tobacco Use  Smoking Status Every Day  Smokeless Tobacco Never   Tobacco Cessation:  N/A, patient does not currently use tobacco products  Blood Alcohol level:  Lab Results  Component Value Date   ETH <10 02/28/2022   ETH <10 12/16/2021   Metabolic Disorder Labs:  Lab Results  Component Value Date   HGBA1C 5.7 (H) 03/04/2022   MPG 116.89 03/04/2022   MPG 131.24 12/18/2021   No results found for: "PROLACTIN" Lab Results  Component Value Date   CHOL 166 03/04/2022   TRIG 248 (H) 03/04/2022   HDL 27 (L) 03/04/2022   CHOLHDL 6.1 03/04/2022   VLDL 50 (H) 03/04/2022    LDLCALC 89 03/04/2022   LDLCALC 50 12/18/2021    See Psychiatric Specialty Exam and Suicide Risk Assessment completed by Attending Physician prior to discharge.  Discharge destination:  Other:  IRC   Is patient on multiple antipsychotic therapies at discharge:  No   Has Patient had three or more failed trials of antipsychotic monotherapy by history:  No  Recommended Plan for Multiple Antipsychotic Therapies: NA  Discharge Instructions     Diet - low sodium heart healthy   Complete by: As directed    Discharge instructions   Complete by: As directed    [facility] 12/20/2021 Follow up.  Specialty: Behavioral Health Why: The new patient paperwork has been mailed to your address and it must be completed and submitted prior to scheduling an appointment. After completing and returning this paperwork, please contact this agency to schedule an intake appointment. Contact information: [address] [place] [phone]   [facility]. Go on 03/30/2022.  Specialty: Behavioral Health Why: You have an appointment for medication management on 03/30/22 at 10:00 am.    (The first appointment must be held in person).  You may schedule an appointment with this provider for therapy services. Contact information: [address] [place] [phone]   [facility] Follow up on 03/13/2022.  Specialty: Infectious Diseases Why: You are scheduled for an intake assessment on 03/13/2022.  Please call if you need to reschedule.  This agency can help with medications, therapy, housing, and other resources. Contact information: [address] [place], [address] [place] [phone]  [facility]. Go to.  Why: Please go to this organization to get up with case management services with housing support.  This  provider supports individuals living with HIV. Contact information: [address],  [place], [place] [phone]   Increase activity slowly   Complete by: As directed       Allergies as of 03/07/2022       Reactions   Bactrim [sulfamethoxazole-trimethoprim] Anaphylaxis        Medication List     STOP taking these medications    Darunavir-Cobicistat-Emtricitabine-Tenofovir Alafenamide 800-150-200-10 MG Tabs Commonly known as: SYMTUZA       TAKE these medications      Indication  benztropine 1 MG tablet Commonly known as: COGENTIN Take 1 tablet (1 mg total) by mouth daily.  Indication: Extrapyramidal Reaction caused by Medications   bictegravir-emtricitabine-tenofovir AF 50-200-25 MG Tabs tablet Commonly  known as: BIKTARVY Take 1 tablet by mouth daily. Start taking on: March 08, 2022  Indication: HIV Disease   dapsone 100 MG tablet Take 100 mg by mouth daily.  Indication: PCP prophylaxis   fluconazole 200 MG tablet Commonly known as: DIFLUCAN Take 2 tablets (400 mg total) by mouth daily. 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  Indication: Generalized Anxiety Disorder   haloperidol 5 MG tablet Commonly known as: HALDOL Take 1 tablet (5 mg total) by mouth 2 (two) times daily.  Indication: Psychosis   mirtazapine 15 MG disintegrating tablet Commonly known as: REMERON SOL-TAB Take 1 tablet (15 mg total) by mouth at bedtime.  Indication: Major Depressive Disorder   traZODone 150 MG tablet Commonly known as: DESYREL Take 1 tablet (150 mg total) by mouth at bedtime as needed for sleep.  Indication: Trouble Sleeping   valACYclovir 1000 MG tablet Commonly known as: VALTREX Take 1 tablet (1,000 mg total) by mouth 2 (two) times daily.  Indication: Herpes Simplex Infection         Follow-up Information     [facility] Follow up.   Specialty: Behavioral Health Why: The new patient paperwork has been mailed to your address and it must be completed and submitted prior to scheduling an appointment. After completing and returning this paperwork, please contact this agency to schedule an intake appointment. Contact information: [place] [phone]        [facility]. Go on 03/30/2022.   Specialty: Behavioral Health Why: You have an appointment for medication management on 03/30/22 at 10:00 am.    (The first appointment must be held in person).  You may schedule an appointment with this provider for therapy services. Contact information: [address], [place] for [facility] Follow up on 03/13/2022.   Specialty: Infectious Diseases Why: You are scheduled for an intake assessment on 03/13/2022.  Please call if you need to reschedule.  This agency can help with medications, therapy, housing, and other resources. Contact information: [place], [place] [id number] [place] [address] [phone]        [place]. Go to.   Why: Please go to this organization to get up with case management services with housing support.  This provider supports individuals living with HIV. Contact information: [address],  [place], [place] [address] [phone]               Follow-up recommendations:  Other:    Continue anti-viral and psychotropic medications as prescribed. Follow up with Infectious Disease appointment 03/13/22 and [facility] appointment on 03/30/22.   Comments:  Patient provided both 7 day samples and physical prescriptions of medications (medical, psychotropic).   Signed: Inda Merlin,  NP 03/07/2022, 12:20 PM

## 2022-03-07 NOTE — Progress Notes (Signed)
Michael Chandler  D/C'd Home per MD order.  Discussed with the patient and all questions fully answered.   An After Visit Summary was printed and given to the patient. Patient received prescription.  D/c education completed with patient including follow up instructions, medication list, d/c activities limitations if indicated, with other d/c instructions as indicated by MD - patient able to verbalize understanding, all questions fully answered.   Patient instructed to return to ED, call 911, or call MD for any changes in condition.   Patient escorted to the main entrance, and D/C home via private auto.  [name] 03/07/2022 12:42 PM

## 2022-03-07 NOTE — BHH Group Notes (Signed)
Adult Psychoeducational Group Note  Date:  03/07/2022 Time:  10:49 AM  Group Topic/Focus:  Goals Group:   The focus of this group is to help patients establish daily goals to achieve during treatment and discuss how the patient can incorporate goal setting into their daily lives to aide in recovery.  Participation Level:  Did Not Attend  Additional Comments:  Patient was notified about group, but did not attend  Burlene Arnt 03/07/2022, 10:49 AM

## 2022-03-07 NOTE — Progress Notes (Signed)
   03/07/22 0500  Sleep  Number of Hours 6.75

## 2022-03-07 NOTE — Progress Notes (Signed)
  [facility] Adult Case Management Discharge Plan :  Will you be returning to the same living situation after discharge:  No. [facility]  At discharge, do you have transportation home?: Yes,  Taxi  Do you have the ability to pay for your medications: Yes,  [facility]   Release of information consent forms completed and in the chart;  Patient's signature needed at discharge.  Patient to Follow up at:  Follow-up Information     [facility] [place] Follow up.   Specialty: Behavioral Health Why: The new patient paperwork has been mailed to your address and it must be completed and submitted prior to scheduling an appointment. After completing and returning this paperwork, please contact this agency to schedule an intake appointment. Contact information: [address] [place] [address] [phone]        [facility]. Go on 03/30/2022.   Specialty: Behavioral Health Why: You have an appointment for medication management on 03/30/22 at 10:00 am.    (The first appointment must be held in person).  You may schedule an appointment with this provider for therapy services. Contact information: [address] [place] [address] [phone]        [facility] for [facility]ious Disease Follow up on 03/13/2022.   Specialty: Infectious Diseases Why: You are scheduled for an intake assessment on 03/13/2022.  Please call if you need to reschedule.  This agency can help with medications, therapy, housing, and other resources. Contact information: [address] [place], [address] [id number] mc [place] [address] [phone]        [facility]. Go to.   Why: Please go to this organization to get up with case management services with housing support.  This provider supports individuals living with HIV. Contact information: [address],  [place], [place] [address] [phone]                Next level of care provider has access to [facility] Link:yes  Safety Planning and Suicide Prevention discussed: Yes,  with patient and mother      Has patient been referred to the Quitline?: Patient refused referral  Patient has been referred for addiction treatment: Pt. refused referral  Aram Beecham, LCSWA 03/07/2022, 9:40 AM

## 2022-03-07 NOTE — BHH Suicide Risk Assessment (Signed)
Suicide Risk Assessment  Discharge Assessment    [facility] Discharge Suicide Risk Assessment   Principal Problem: Major depressive disorder, recurrent severe without psychotic features (HCC) Discharge Diagnoses: Principal Problem:   Major depressive disorder, recurrent severe without psychotic features (HCC) Active Problems:   HIV disease (HCC)   Methamphetamine abuse (HCC)   Suicide ideation   Total Time spent with patient: 30 minutes  Musculoskeletal: Strength & Muscle Tone: within normal limits Gait & Station: normal Patient leans: N/A  Psychiatric Specialty Exam  Presentation  General Appearance:  Casual  Eye Contact: Fair  Speech: Clear and Coherent  Speech Volume: Normal  Handedness: Right  Mood and Affect  Mood: -- ("ok")  Duration of Depression Symptoms: Greater than two weeks  Affect: Congruent (still some guarding)  Thought Process  Thought Processes: Coherent  Descriptions of Associations:Intact  Orientation:Full (Time, Place and Person)  Thought Content:Logical; WDL  History of Schizophrenia/Schizoaffective disorder:Yes  Duration of Psychotic Symptoms:Greater than six months  Hallucinations:Hallucinations: None  Ideas of Reference:None  Suicidal Thoughts:Suicidal Thoughts: No  Homicidal Thoughts:Homicidal Thoughts: No  Sensorium  Memory: Immediate Fair; Recent Fair  Judgment: Fair  Insight: Present  Executive Functions  Concentration: Fair  Attention Span: Fair  Recall: [employer] of Knowledge: Fair  Language: Fair  Psychomotor Activity  Psychomotor Activity: Psychomotor Activity: Normal  Assets  Assets: Communication Skills; Desire for Improvement; Resilience; Social Support  Sleep  Sleep: Sleep: Good  Physical Exam: Physical Exam Vitals and nursing note reviewed.  Constitutional:      Appearance: He is ill-appearing.  HENT:     Head: Normocephalic.     Nose: Nose normal.     Mouth/Throat:      Mouth: Mucous membranes are moist.     Pharynx: Oropharynx is clear.  Eyes:     Pupils: Pupils are equal, round, and reactive to light.  Cardiovascular:     Rate and Rhythm: Normal rate.     Pulses: Normal pulses.  Pulmonary:     Effort: Pulmonary effort is normal.  Abdominal:     General: Abdomen is flat.  Musculoskeletal:        General: Normal range of motion.     Cervical back: Normal range of motion.  Skin:    General: Skin is warm and dry.  Neurological:     General: No focal deficit present.     Mental Status: He is alert. Mental status is at baseline.  Psychiatric:        Attention and Perception: Attention and perception normal.        Mood and Affect: Mood and affect normal.        Speech: Speech normal.        Behavior: Behavior is cooperative.        Thought Content: Thought content is not paranoid or delusional. Thought content does not include homicidal or suicidal ideation. Thought content does not include homicidal or suicidal plan.        Cognition and Memory: Cognition and memory normal.        Judgment: Judgment normal.    Review of Systems  Psychiatric/Behavioral:  Positive for depression and substance abuse.   All other systems reviewed and are negative.  Blood pressure 104/61, pulse (!) 139, temperature 97.6 F (36.4 C), resp. rate 16, height 6\' 5"  (1.956 m), weight 79.4 kg, SpO2 100 %. Body mass index is 20.75 kg/m.  Mental Status Per Nursing Assessment::   On Admission:  NA  Demographic Factors:  Male, Adolescent or young adult, Cardell Peach, lesbian, or bisexual orientation, Low socioeconomic status, Unemployed, and HIV/AIDS status  Loss Factors: Decrease in vocational status, Decline in physical health, and Financial problems/change in socioeconomic status  Historical Factors: History of substance abuse  Risk Reduction Factors:   Sense of responsibility to family and Religious beliefs about death  Continued Clinical Symptoms:  Depression:    Anhedonia Previous Psychiatric Diagnoses and Treatments  Cognitive Features That Contribute To Risk:  None    Suicide Risk:  Moderate:  Frequent suicidal ideation with limited intensity, and duration, some specificity in terms of plans, no associated intent, good self-control, limited dysphoria/symptomatology, some risk factors present, and identifiable protective factors, including available and accessible social support.   Follow-up Information     [facility] Follow up.   Specialty: Behavioral Health Why: The new patient paperwork has been mailed to your address and it must be completed and submitted prior to scheduling an appointment. After completing and returning this paperwork, please contact this agency to schedule an intake appointment. Contact information: [address] [place] [address] [phone]        [facility]. Go on 03/30/2022.   Specialty: Behavioral Health Why: You have an appointment for medication management on 03/30/22 at 10:00 am.    (The first appointment must be held in person).  You may schedule an appointment with this provider for therapy services. Contact information: [address] [place] [address] [phone]        [facility] Follow up on 03/13/2022.   Specialty: Infectious Diseases Why: You are scheduled for an intake assessment on 03/13/2022.  Please call if you need to reschedule.  This agency can help with medications, therapy, housing, and other resources. Contact information: [address] [place], [address] [phone] mc [place] [address] [phone]        [facility]. Go to.   Why: Please go to this organization to get up with case management services with housing support.  This provider supports individuals living with HIV. Contact information: [address]   [place], [place] [address] [phone]               Plan Of Care/Follow-up recommendations:  Other:  Continue anti-viral and psychotropic medications as prescribed. Follow up with Infectious Disease appointment 03/13/22 and [facility] appointment on 03/30/22.   Loletta Parish, NP 03/07/2022, 10:20 AM

## 2022-03-07 NOTE — Progress Notes (Signed)
   Administered PRN Mucinex DM and Trazodone per [facility] per pt request.

## 2022-03-08 NOTE — BHH Group Notes (Signed)
Spiritual care group on grief and loss facilitated by chaplain Katy Calise Dunckel, BCC   Group Goal:   Support / Education around grief and loss   Members engage in facilitated group support and psycho-social education.   Group Description:   Following introductions and group rules, group members engaged in facilitated group dialog and support around topic of loss, with particular support around experiences of loss in their lives. Group Identified types of loss (relationships / self / things) and identified patterns, circumstances, and changes that precipitate losses. Reflected on thoughts / feelings around loss, normalized grief responses, and recognized variety in grief experience. Group noted Worden's four tasks of grief in discussion.   Group drew on Adlerian / Rogerian, narrative, MI,   Patient Progress: Did not attend.  

## 2022-03-13 ENCOUNTER — Encounter: Payer: Self-pay | Admitting: Internal Medicine

## 2022-03-13 ENCOUNTER — Other Ambulatory Visit (HOSPITAL_COMMUNITY): Payer: Self-pay

## 2022-03-13 ENCOUNTER — Other Ambulatory Visit (HOSPITAL_COMMUNITY)
Admission: RE | Admit: 2022-03-13 | Discharge: 2022-03-13 | Disposition: A | Discharge status: Home / Self Care | Payer: No Typology Code available for payment source | Source: Ambulatory Visit | Attending: Internal Medicine | Admitting: Internal Medicine

## 2022-03-13 ENCOUNTER — Ambulatory Visit (INDEPENDENT_AMBULATORY_CARE_PROVIDER_SITE_OTHER): Payer: No Typology Code available for payment source | Admitting: Pharmacist

## 2022-03-13 ENCOUNTER — Other Ambulatory Visit: Payer: Self-pay

## 2022-03-13 ENCOUNTER — Ambulatory Visit (INDEPENDENT_AMBULATORY_CARE_PROVIDER_SITE_OTHER): Payer: No Typology Code available for payment source | Admitting: Internal Medicine

## 2022-03-13 VITALS — BP 132/66 | HR 99 | Temp 98.8°F | Ht 77.0 in | Wt 195.0 lb

## 2022-03-13 DIAGNOSIS — B181 Chronic viral hepatitis B without delta-agent: Secondary | ICD-10-CM

## 2022-03-13 DIAGNOSIS — A539 Syphilis, unspecified: Secondary | ICD-10-CM | POA: Insufficient documentation

## 2022-03-13 DIAGNOSIS — Z2989 Encounter for other specified prophylactic measures: Secondary | ICD-10-CM

## 2022-03-13 DIAGNOSIS — B009 Herpesviral infection, unspecified: Secondary | ICD-10-CM

## 2022-03-13 DIAGNOSIS — M4642 Discitis, unspecified, cervical region: Secondary | ICD-10-CM | POA: Diagnosis not present

## 2022-03-13 DIAGNOSIS — F332 Major depressive disorder, recurrent severe without psychotic features: Secondary | ICD-10-CM

## 2022-03-13 DIAGNOSIS — Z79899 Other long term (current) drug therapy: Secondary | ICD-10-CM | POA: Diagnosis not present

## 2022-03-13 DIAGNOSIS — B3781 Candidal esophagitis: Secondary | ICD-10-CM | POA: Insufficient documentation

## 2022-03-13 DIAGNOSIS — B20 Human immunodeficiency virus [HIV] disease: Secondary | ICD-10-CM | POA: Insufficient documentation

## 2022-03-13 MED ORDER — BIKTARVY 50-200-25 MG PO TABS
1.0000 | ORAL_TABLET | Freq: Every day | ORAL | 5 refills | Status: DC
  Filled 2022-03-13: qty 30, 30d supply, fill #0

## 2022-03-13 MED ORDER — DAPSONE 100 MG PO TABS
100.0000 mg | ORAL_TABLET | Freq: Every day | ORAL | 5 refills | Status: DC
  Filled 2022-03-13: qty 30, 30d supply, fill #0

## 2022-03-13 NOTE — Assessment & Plan Note (Signed)
Will be treated with Biktarvy.

## 2022-03-13 NOTE — Assessment & Plan Note (Signed)
RPR serofast at 1:2.  Will follow periodically.

## 2022-03-13 NOTE — Assessment & Plan Note (Signed)
Patient to be followed by psychiatry.

## 2022-03-13 NOTE — Assessment & Plan Note (Signed)
Continue dapsone for PCP prophylaxis and check G6PD today.  Noted to be allergic to Bactrim.

## 2022-03-13 NOTE — Progress Notes (Signed)
[facility]  Reason for Consult: HIV new patient Referring Provider: [facility]   HPI:    Michael Chandler is a 36 y.o. male with a past medical history as below who presents to clinic as a new patient for HIV care.    Patient is here as a new patient in our care.  He has long standing history of advanced HIV not consistently in care.  He was recently admitted in [place], [place] where extensive ID work up was undertaken and viewable in [employer].  He subsequently made his way back to [place].  During that admission in [place], he was started on Biktarvy, Dapsone for PCP prophylaxis, and fluconazole for thrush.  He then came back to [facility] where he was admitted at [facility] for SI.  He was discharged on 03/07/22 and presents today for follow up.  He is tolerating his medications at this time and was also started on Valtrex 1gm BID x 7 days during his [facility] admission.    Patient's Medications  New Prescriptions   No medications on file  Previous Medications   BENZTROPINE (COGENTIN) 1 MG TABLET    Take 1 tablet (1 mg total) by mouth daily.   GABAPENTIN (NEURONTIN) 300 MG CAPSULE    Take 1 capsule (300 mg total) by mouth 3 (three) times daily.   HALOPERIDOL (HALDOL) 5 MG TABLET    Take 1 tablet (5 mg total) by mouth 2 (two) times daily.   MIRTAZAPINE (REMERON SOL-TAB) 15 MG DISINTEGRATING TABLET    Take 1 tablet (15 mg total) by mouth at bedtime.   TRAZODONE (DESYREL) 150 MG TABLET    Take 1 tablet (150 mg total) by mouth at bedtime as needed for sleep.  Modified Medications   Modified Medication Previous Medication   BICTEGRAVIR-EMTRICITABINE-TENOFOVIR AF (BIKTARVY) 50-200-25 MG TABS TABLET bictegravir-emtricitabine-tenofovir AF (BIKTARVY) 50-200-25 MG TABS tablet      Take 1 tablet by mouth daily.    Take 1 tablet by mouth daily.   DAPSONE 100 MG TABLET dapsone 100 MG tablet      Take 1 tablet (100 mg total) by mouth daily.    Take 100 mg by mouth  daily.  Discontinued Medications   FLUCONAZOLE (DIFLUCAN) 200 MG TABLET    Take 2 tablets (400 mg total) by mouth daily.   VALACYCLOVIR (VALTREX) 1000 MG TABLET    Take 1 tablet (1,000 mg total) by mouth 2 (two) times daily.      Past Medical History:  Diagnosis Date   HIV (human immunodeficiency virus infection) (HCC)    Schizophrenia (HCC)    per IVC paperwork, pt states does not have this dx    Social History   Tobacco Use   Smoking status: Former    Types: Cigarettes   Smokeless tobacco: Never   Tobacco comments:    Patient denies using tobacco products (03/13/22)  Substance Use Topics   Alcohol use: Yes    Comment: occasional   Drug use: Yes    Types: Methamphetamines, Marijuana, Cocaine    Comment: states occasional marijuana and meth use    History reviewed. No pertinent family history.  Allergies  Allergen Reactions   Bactrim [Sulfamethoxazole-Trimethoprim] Anaphylaxis    Review of Systems  Constitutional:  Negative for fever.  Gastrointestinal: Negative.   Genitourinary: Negative.   Musculoskeletal:  Positive for neck pain.  All other systems reviewed and are negative.    OBJECTIVE:    Vitals:  03/13/22 1007  BP: (!) 150/82  Pulse: 99  Temp: 98.8 F (37.1 C)  TempSrc: Oral  SpO2: 100%  Weight: 195 lb (88.5 kg)  Height: 6\' 5"  (1.956 m)     Body mass index is 23.12 kg/m.   Physical Exam Constitutional:      Appearance: Normal appearance.  HENT:     Head: Normocephalic and atraumatic.  Eyes:     Extraocular Movements: Extraocular movements intact.     Conjunctiva/sclera: Conjunctivae normal.  Pulmonary:     Effort: Pulmonary effort is normal. No respiratory distress.  Abdominal:     General: There is no distension.     Palpations: Abdomen is soft.  Musculoskeletal:        General: Normal range of motion.     Cervical back: Normal range of motion and neck supple.  Skin:    General: Skin is warm and dry.  Neurological:     General:  No focal deficit present.     Mental Status: He is alert and oriented to person, place, and time.  Psychiatric:        Mood and Affect: Mood normal.        Behavior: Behavior normal.     Labs and Microbiology:    Latest Ref Rng & Units 03/01/2022   11:45 AM 02/28/2022   12:31 PM 12/16/2021    3:36 PM  CMP  Glucose 70 - 99 mg/dL 12/18/2021  80  87   BUN 6 - 20 mg/dL 19  23  14    Creatinine 0.61 - 1.24 mg/dL 196   2.22   Sodium 135 - 145 mmol/L 135  130  136   Potassium 3.5 - 5.1 mmol/L 4.4  3.7  4.0   Chloride 98 - 111 mmol/L 101  94  103   CO2 22 - 32 mmol/L 27  24  28    Calcium 8.9 - 10.3 mg/dL 8.4  8.5  8.8   Total Protein 6.5 - 8.1 g/dL   7.9   Total Bilirubin 0.3 - 1.2 mg/dL   0.6   Alkaline Phos 38 - 126 U/L   129   AST 15 - 41 U/L   28   ALT 0 - 44 U/L   21       Latest Ref Rng & Units 02/28/2022   12:31 PM 12/18/2021    2:45 PM 12/16/2021    3:36 PM  CBC  WBC 4.0 - 10.5 K/uL 4.0  5.3  4.5   Hemoglobin 13.0 - 17.0 g/dL 9.2  13/10/2021  12/20/2021   Hematocrit 39.0 - 52.0 % 29.5  36.0  35.0   Platelets 150 - 400 K/uL 318  239  258      Lab Results  Component Value Date   HIV1RNAQUANT 174,000 12/18/2021    RPR and STI: Lab Results  Component Value Date   LABRPR Reactive (A) 03/02/2022   LABRPR Reactive (A) 12/20/2021   LABRPR Reactive (A) 09/29/2021        No data to display          Hepatitis B: No results found for: "HEPBSAB", "HEPBSAG", "HEPBCAB" Hepatitis C: No results found for: "HEPCAB", "HCVRNAPCRQN" Hepatitis A: No results found for: "HAV" Lipids: Lab Results  Component Value Date   CHOL 166 03/04/2022   TRIG 248 (H) 03/04/2022   HDL 27 (L) 03/04/2022   CHOLHDL 6.1 03/04/2022   VLDL 50 (H) 03/04/2022   LDLCALC 89 03/04/2022  ASSESSMENT & PLAN:    HIV disease (HCC) Continue with Biktarvy and repeat labs today.  Will engage him with THP as well to help with his retention in care.   Need for pneumocystis prophylaxis Continue dapsone  for PCP prophylaxis and check G6PD today.  Noted to be allergic to Bactrim.  Candida esophagitis (HCC) Finishing course of fluconazole 400mg  daily.  HSV (herpes simplex virus) infection No acute flare up or concerns.  Will stop Valtrex as appears to have been started due to positive serologies.  Syphilis RPR serofast at 1:2.  Will follow periodically.   Chronic viral hepatitis B without delta-agent (HCC) Will be treated with Biktarvy.  Major depressive disorder, recurrent severe without psychotic features [facility]) Patient to be followed by psychiatry.  Discitis of cervical region Hx C5-C6 osteodiscitis and epidural abscess s/p surgical decompression November 2021 per the notes in Care Everywhere.  He reports this was done in December 2021.  He reports having an abscess that was decompressed with hardware placement but unable to provide further details.  Reports he did not have a prolonged antibiotic course at that time.  He reports today worsened left sided neck pain and feels drained from the chronic pain.  It is constant and he thinks worsening.  Will obtain MRI cervical spine with and without contrast and NSGY referral.     Orders Placed This Encounter  Procedures   MR CERVICAL SPINE W WO CONTRAST    Standing Status:   Future    Standing Expiration Date:   03/14/2023    Order Specific Question:   If indicated for the ordered procedure, I authorize the administration of contrast media per Radiology protocol    Answer:   Yes    Order Specific Question:   What is the patient's sedation requirement?    Answer:   No Sedation    Order Specific Question:   Does the patient have a pacemaker or implanted devices?    Answer:   No    Order Specific Question:   Preferred imaging location?    Answer:   [facility] (table limit - 500 lbs)   CBC WITH DIFFERENTIAL/PLATELET   COMPLETE METABOLIC PANEL WITH GFR   RPR   HEPATITIS B SURFACE ANTIGEN   HEPATITIS B SURFACE ANTIBODY   HEPATITIS B CORE  ANTIBODY, TOTAL   HEPATITIS C ANTIBODY   HEPATITIS A ANTIBODY, TOTAL   URINALYSIS   LIPID PANEL   QuantiFERON-TB Gold Plus   T-helper cell (CD4)- ([facility] clinic only)   Glucose 6 phosphate dehydrogenase   HIV-1 RNA quant-no reflex-bld   HIV-1 Genotyping (RTI,PI,IN Inhbtr)   Hepatitis B DNA, ultraquantitative, PCR   Ambulatory referral to Neurosurgery    Referral Priority:   Routine    Referral Type:   Surgical    Referral Reason:   Specialty Services Required    Requested Specialty:   Neurosurgery    Number of Visits Requested:   1     [facility] 03/13/2022, 10:44 AM

## 2022-03-13 NOTE — Patient Instructions (Addendum)
[facility] Providers [facility], [address], [place], [place] [phone] [facility], [address], [place], [place], [phone] [facility], [address] [place], [place] [phone] [facility] (many locations): [email or web address]   Continue Biktarvy for HIV and Dapsone for pneumonia prevention.  Refills sent to pharmacy.  MRI of neck ordered.  They will call to schedule.  I placed neurosurgery referral as well.  Follow up with Psychiatry as scheduled.

## 2022-03-13 NOTE — Assessment & Plan Note (Signed)
No acute flare up or concerns.  Will stop Valtrex as appears to have been started due to positive serologies.

## 2022-03-13 NOTE — Progress Notes (Signed)
HPI: Michael Chandler is a 36 y.o. male who presents to the [facility] clinic to establish care for HIV management.   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   BIKTARVY 50-200-25 MG TABS TABLET    Take 1 tablet by mouth daily.   DAPSONE 100 MG TABLET    Take 1 tablet (100 mg total) by mouth daily.   GABAPENTIN (NEURONTIN) 300 MG CAPSULE    Take 1 capsule (300 mg total) by mouth 3 (three) times daily.   HALOPERIDOL (HALDOL) 5 MG TABLET    Take 1 tablet (5 mg total) by mouth 2 (two) times daily.   MIRTAZAPINE (REMERON SOL-TAB) 15 MG DISINTEGRATING TABLET    Take 1 tablet (15 mg total) by mouth at bedtime.   TRAZODONE (DESYREL) 150 MG TABLET    Take 1 tablet (150 mg total) by mouth at bedtime as needed for sleep.  Modified Medications   No medications on file  Discontinued Medications   No medications on file    Allergies: Allergies  Allergen Reactions   Bactrim [Sulfamethoxazole-Trimethoprim] Anaphylaxis    Past Medical History: Past Medical History:  Diagnosis Date   HIV (human immunodeficiency virus infection) (HCC)    Schizophrenia (HCC)    per IVC paperwork, pt states does not have this dx    Social History: Social History   Socioeconomic  History   Marital status: Single    Spouse name: Not on file   Number of children: Not on file   Years of education: Not on file   Highest education level: Not on file  Occupational History   Not on file  Tobacco Use   Smoking status: Former    Types: Cigarettes   Smokeless tobacco: Never   Tobacco comments:    Patient denies using tobacco products (03/13/22)  Substance and Sexual Activity   Alcohol use: Yes    Comment: occasional   Drug use: Yes    Types: Methamphetamines, Marijuana, Cocaine    Comment: states occasional marijuana and meth use   Sexual activity: Not Currently    Comment: condoms accepted  Other Topics Concern   Not on file  Social History Narrative   Not on file   Social Determinants of Health   Financial Resource Strain: Not on file  Food Insecurity: Food Insecurity Present (03/02/2022)   Hunger Vital Sign    Worried About Running Out of Food in the Last Year: Not on file    Ran Out of Food in the Last Year: Sometimes true  Transportation Needs: Not on file  Physical Activity: Not on file  Stress: Not on file  Social Connections: Not on file    Labs: Lab Results  Component Value Date   HIV1RNAQUANT 174,000 12/18/2021    RPR and STI Lab Results  Component Value Date   LABRPR Reactive (A) 03/02/2022   LABRPR Reactive (A) 12/20/2021   LABRPR Reactive (A) 09/29/2021        No data to display          Hepatitis B No results found for: "HEPBSAB", "HEPBSAG", "HEPBCAB" Hepatitis C No results found for: "HEPCAB", "HCVRNAPCRQN" Hepatitis A No results found for: "HAV" Lipids: Lab Results  Component Value Date   CHOL 166 03/04/2022   TRIG 248 (H) 03/04/2022   HDL 27 (L) 03/04/2022   CHOLHDL 6.1 03/04/2022   VLDL 50 (H) 03/04/2022   LDLCALC 89 03/04/2022     Assessment: Michael Chandler presents to the clinic to establish care for their HIV infection. He had been seen previously in [place] and started on Biktarvy, fluconazole and dapsone at  that time, however it does not appear he has continued his ART regimen. No lab work is currently available, but it has been ordered during the visit. Medications reviewed; no drug interactions were found. Will re-start Biktarvy.  Explained that Phillips Odor is a one pill once daily medication with or without food and the importance of not missing any doses. Explained resistance and how it develops and why it is so important to take Biktarvy daily and not skip days or doses. Counseled patient to take it around the same time each day. Counseled on what to do if dose is missed, if closer to missed dose take immediately, if closer to next dose then skip and resume normal schedule.   Cautioned on possible side effects the first week or so including nausea, diarrhea, dizziness, and headaches but that they should resolve after the first couple of weeks. Counseled patient to separate Biktarvy from divalent cations including multivitamins. Discussed with patient to call clinic if he starts a new medication or herbal supplement. I gave the patient my card and told him to call me with any issues/questions/concerns.  Patient is currently insured and he states he would like his medications filled at the [facility] via mail order.   Plan: - Re-start Biktarvy  - Will follow up on lab work  Adria Dill, PharmD PGY-2 Infectious Diseases Resident  03/13/2022 2:46 PM

## 2022-03-13 NOTE — Assessment & Plan Note (Addendum)
Continue with Biktarvy and repeat labs today.  Will engage him with THP as well to help with his retention in care.

## 2022-03-13 NOTE — Assessment & Plan Note (Signed)
Hx C5-C6 osteodiscitis and epidural abscess s/p surgical decompression November 2021 per the notes in Care Everywhere.  He reports this was done in [place].  He reports having an abscess that was decompressed with hardware placement but unable to provide further details.  Reports he did not have a prolonged antibiotic course at that time.  He reports today worsened left sided neck pain and feels drained from the chronic pain.  It is constant and he thinks worsening.  Will obtain MRI cervical spine with and without contrast and NSGY referral.

## 2022-03-13 NOTE — Assessment & Plan Note (Signed)
Finishing course of fluconazole 400mg  daily.

## 2022-03-14 ENCOUNTER — Telehealth: Payer: Self-pay

## 2022-03-14 LAB — URINE CYTOLOGY ANCILLARY ONLY
Chlamydia: NEGATIVE
Comment: NEGATIVE
Comment: NORMAL
Neisseria Gonorrhea: NEGATIVE

## 2022-03-14 LAB — T-HELPER CELL (CD4) - (RCID CLINIC ONLY)
CD4 % Helper T Cell: 13 % — ABNORMAL LOW (ref 33–65)
CD4 T Cell Abs: 524 /uL (ref 400–1790)

## 2022-03-14 NOTE — Telephone Encounter (Signed)
-----   Message from Kathlynn Grate, DO sent at 03/14/2022  7:02 AM EST ----- Can you please reach out to patient and advise that his WBC is quite elevated compared to just 2 weeks ago.  Could be a sign of systemic infection as he reported not feeling very well yesterday.  His history includes injection drug use.  Can you clarify if and when he last used IV drugs.  I would ask him to come in for labs to repeat a CBC and check peripheral blood cultures x 2.    Thanks

## 2022-03-14 NOTE — Telephone Encounter (Signed)
Left voicemail asking patient to return my call.   Naeema Patlan P Domonique Brouillard, CMA  

## 2022-03-15 ENCOUNTER — Emergency Department: Payer: No Typology Code available for payment source

## 2022-03-15 ENCOUNTER — Other Ambulatory Visit: Payer: Self-pay

## 2022-03-15 ENCOUNTER — Inpatient Hospital Stay
Admission: EM | Admit: 2022-03-15 | Discharge: 2022-03-22 | DRG: 975 | Disposition: A | Discharge status: Home / Self Care | Payer: No Typology Code available for payment source | Attending: Internal Medicine | Admitting: Internal Medicine

## 2022-03-15 ENCOUNTER — Encounter: Payer: Self-pay | Admitting: Radiology

## 2022-03-15 DIAGNOSIS — M542 Cervicalgia: Secondary | ICD-10-CM | POA: Diagnosis not present

## 2022-03-15 DIAGNOSIS — M545 Low back pain, unspecified: Secondary | ICD-10-CM | POA: Diagnosis not present

## 2022-03-15 DIAGNOSIS — Z882 Allergy status to sulfonamides status: Secondary | ICD-10-CM | POA: Diagnosis not present

## 2022-03-15 DIAGNOSIS — R0902 Hypoxemia: Secondary | ICD-10-CM | POA: Diagnosis present

## 2022-03-15 DIAGNOSIS — H6123 Impacted cerumen, bilateral: Secondary | ICD-10-CM | POA: Diagnosis present

## 2022-03-15 DIAGNOSIS — M5136 Other intervertebral disc degeneration, lumbar region: Secondary | ICD-10-CM | POA: Diagnosis present

## 2022-03-15 DIAGNOSIS — R0981 Nasal congestion: Secondary | ICD-10-CM | POA: Diagnosis not present

## 2022-03-15 DIAGNOSIS — R590 Localized enlarged lymph nodes: Secondary | ICD-10-CM | POA: Diagnosis not present

## 2022-03-15 DIAGNOSIS — J0140 Acute pansinusitis, unspecified: Secondary | ICD-10-CM | POA: Diagnosis not present

## 2022-03-15 DIAGNOSIS — A419 Sepsis, unspecified organism: Principal | ICD-10-CM | POA: Diagnosis present

## 2022-03-15 DIAGNOSIS — E871 Hypo-osmolality and hyponatremia: Secondary | ICD-10-CM | POA: Diagnosis present

## 2022-03-15 DIAGNOSIS — B1910 Unspecified viral hepatitis B without hepatic coma: Secondary | ICD-10-CM | POA: Diagnosis present

## 2022-03-15 DIAGNOSIS — R159 Full incontinence of feces: Secondary | ICD-10-CM | POA: Diagnosis present

## 2022-03-15 DIAGNOSIS — D75839 Thrombocytosis, unspecified: Secondary | ICD-10-CM | POA: Diagnosis not present

## 2022-03-15 DIAGNOSIS — Z91199 Patient's noncompliance with other medical treatment and regimen due to unspecified reason: Secondary | ICD-10-CM | POA: Diagnosis not present

## 2022-03-15 DIAGNOSIS — R591 Generalized enlarged lymph nodes: Secondary | ICD-10-CM | POA: Diagnosis not present

## 2022-03-15 DIAGNOSIS — R052 Subacute cough: Secondary | ICD-10-CM

## 2022-03-15 DIAGNOSIS — Z87891 Personal history of nicotine dependence: Secondary | ICD-10-CM | POA: Diagnosis not present

## 2022-03-15 DIAGNOSIS — A539 Syphilis, unspecified: Secondary | ICD-10-CM | POA: Diagnosis present

## 2022-03-15 DIAGNOSIS — D899 Disorder involving the immune mechanism, unspecified: Secondary | ICD-10-CM | POA: Diagnosis not present

## 2022-03-15 DIAGNOSIS — D849 Immunodeficiency, unspecified: Secondary | ICD-10-CM | POA: Diagnosis not present

## 2022-03-15 DIAGNOSIS — F332 Major depressive disorder, recurrent severe without psychotic features: Secondary | ICD-10-CM | POA: Diagnosis not present

## 2022-03-15 DIAGNOSIS — B20 Human immunodeficiency virus [HIV] disease: Secondary | ICD-10-CM | POA: Diagnosis present

## 2022-03-15 DIAGNOSIS — D6489 Other specified anemias: Secondary | ICD-10-CM | POA: Diagnosis not present

## 2022-03-15 DIAGNOSIS — R509 Fever, unspecified: Secondary | ICD-10-CM | POA: Diagnosis not present

## 2022-03-15 DIAGNOSIS — R5081 Fever presenting with conditions classified elsewhere: Secondary | ICD-10-CM | POA: Diagnosis not present

## 2022-03-15 DIAGNOSIS — M549 Dorsalgia, unspecified: Secondary | ICD-10-CM | POA: Diagnosis not present

## 2022-03-15 DIAGNOSIS — B3781 Candidal esophagitis: Secondary | ICD-10-CM | POA: Diagnosis present

## 2022-03-15 DIAGNOSIS — F1920 Other psychoactive substance dependence, uncomplicated: Secondary | ICD-10-CM | POA: Diagnosis not present

## 2022-03-15 DIAGNOSIS — R221 Localized swelling, mass and lump, neck: Secondary | ICD-10-CM | POA: Diagnosis not present

## 2022-03-15 DIAGNOSIS — R9431 Abnormal electrocardiogram [ECG] [EKG]: Secondary | ICD-10-CM | POA: Diagnosis not present

## 2022-03-15 DIAGNOSIS — J324 Chronic pansinusitis: Secondary | ICD-10-CM | POA: Diagnosis not present

## 2022-03-15 DIAGNOSIS — D649 Anemia, unspecified: Secondary | ICD-10-CM | POA: Diagnosis present

## 2022-03-15 DIAGNOSIS — E872 Acidosis, unspecified: Secondary | ICD-10-CM | POA: Diagnosis present

## 2022-03-15 DIAGNOSIS — F1510 Other stimulant abuse, uncomplicated: Secondary | ICD-10-CM | POA: Diagnosis not present

## 2022-03-15 DIAGNOSIS — R4182 Altered mental status, unspecified: Secondary | ICD-10-CM | POA: Diagnosis not present

## 2022-03-15 DIAGNOSIS — Z1152 Encounter for screening for COVID-19: Secondary | ICD-10-CM

## 2022-03-15 DIAGNOSIS — R000 Tachycardia, unspecified: Secondary | ICD-10-CM | POA: Diagnosis not present

## 2022-03-15 DIAGNOSIS — R609 Edema, unspecified: Secondary | ICD-10-CM | POA: Diagnosis not present

## 2022-03-15 DIAGNOSIS — J189 Pneumonia, unspecified organism: Secondary | ICD-10-CM | POA: Diagnosis not present

## 2022-03-15 DIAGNOSIS — R079 Chest pain, unspecified: Secondary | ICD-10-CM | POA: Diagnosis not present

## 2022-03-15 DIAGNOSIS — R0602 Shortness of breath: Secondary | ICD-10-CM | POA: Insufficient documentation

## 2022-03-15 LAB — URINALYSIS, COMPLETE (UACMP) WITH MICROSCOPIC
Bacteria, UA: NONE SEEN
Bilirubin Urine: NEGATIVE
Glucose, UA: NEGATIVE mg/dL
Hgb urine dipstick: NEGATIVE
Ketones, ur: NEGATIVE mg/dL
Leukocytes,Ua: NEGATIVE
Nitrite: NEGATIVE
Protein, ur: 100 mg/dL — AB
Specific Gravity, Urine: >1.046 — ABNORMAL HIGH (ref 1.005–1.030)
Squamous Epithelial / HPF: NONE SEEN (ref 0–5)
pH: 6 (ref 5.0–8.0)

## 2022-03-15 LAB — CBC WITH DIFFERENTIAL/PLATELET
Abs Immature Granulocytes: 0.16 10*3/uL — ABNORMAL HIGH (ref 0.00–0.07)
Basophils Absolute: 0.1 10*3/uL (ref 0.0–0.1)
Basophils Relative: 0 %
Eosinophils Absolute: 0.1 10*3/uL (ref 0.0–0.5)
Eosinophils Relative: 1 %
HCT: 27.8 % — ABNORMAL LOW (ref 39.0–52.0)
Hemoglobin: 8.8 g/dL — ABNORMAL LOW (ref 13.0–17.0)
Immature Granulocytes: 1 %
Lymphocytes Relative: 16 %
Lymphs Abs: 3.2 10*3/uL (ref 0.7–4.0)
MCH: 25.6 pg — ABNORMAL LOW (ref 26.0–34.0)
MCHC: 31.7 g/dL (ref 30.0–36.0)
MCV: 80.8 fL (ref 80.0–100.0)
Monocytes Absolute: 3.2 10*3/uL — ABNORMAL HIGH (ref 0.1–1.0)
Monocytes Relative: 16 %
Neutro Abs: 13.2 10*3/uL — ABNORMAL HIGH (ref 1.7–7.7)
Neutrophils Relative %: 66 %
Platelets: 455 10*3/uL — ABNORMAL HIGH (ref 150–400)
RBC: 3.44 MIL/uL — ABNORMAL LOW (ref 4.22–5.81)
RDW: 16.6 % — ABNORMAL HIGH (ref 11.5–15.5)
Smear Review: NORMAL
WBC: 19.9 10*3/uL — ABNORMAL HIGH (ref 4.0–10.5)
nRBC: 0 % (ref 0.0–0.2)

## 2022-03-15 LAB — COMPREHENSIVE METABOLIC PANEL
ALT: 26 U/L (ref 0–44)
AST: 29 U/L (ref 15–41)
Albumin: 2.8 g/dL — ABNORMAL LOW (ref 3.5–5.0)
Alkaline Phosphatase: 346 U/L — ABNORMAL HIGH (ref 38–126)
Anion gap: 8 (ref 5–15)
BUN: 18 mg/dL (ref 6–20)
CO2: 22 mmol/L (ref 22–32)
Calcium: 8.8 mg/dL — ABNORMAL LOW (ref 8.9–10.3)
Chloride: 102 mmol/L (ref 98–111)
Creatinine, Ser: 0.93 mg/dL (ref 0.61–1.24)
GFR, Estimated: >60 mL/min (ref >60)
Glucose, Bld: 99 mg/dL (ref 70–99)
Potassium: 4.7 mmol/L (ref 3.5–5.1)
Sodium: 132 mmol/L — ABNORMAL LOW (ref 135–145)
Total Bilirubin: 0.5 mg/dL (ref 0.3–1.2)
Total Protein: 8.3 g/dL — ABNORMAL HIGH (ref 6.5–8.1)

## 2022-03-15 LAB — RESP PANEL BY RT-PCR (FLU A&B, COVID) ARPGX2
Influenza A by PCR: NEGATIVE
Influenza B by PCR: NEGATIVE
SARS Coronavirus 2 by RT PCR: NEGATIVE

## 2022-03-15 LAB — MONONUCLEOSIS SCREEN: Mono Screen: NEGATIVE

## 2022-03-15 LAB — LACTIC ACID, PLASMA
Lactic Acid, Venous: 0.9 mmol/L (ref 0.5–1.9)
Lactic Acid, Venous: 0.6 mmol/L (ref 0.5–1.9)

## 2022-03-15 MED ORDER — PIPERACILLIN-TAZOBACTAM 3.375 G IVPB 30 MIN
3.3750 g | Freq: Once | INTRAVENOUS | Status: AC
  Administered 2022-03-15: 3.375 g via INTRAVENOUS
  Filled 2022-03-15: qty 50

## 2022-03-15 MED ORDER — IOHEXOL 300 MG/ML  SOLN
75.0000 mL | Freq: Once | INTRAMUSCULAR | Status: AC | PRN
  Administered 2022-03-15: 75 mL via INTRAVENOUS

## 2022-03-15 MED ORDER — SODIUM CHLORIDE 0.9 % IV SOLN
500.0000 mg | Freq: Once | INTRAVENOUS | Status: AC
  Administered 2022-03-15: 500 mg via INTRAVENOUS
  Filled 2022-03-15: qty 5

## 2022-03-15 MED ORDER — LACTATED RINGERS IV SOLN: INTRAVENOUS | Status: DC

## 2022-03-15 MED ORDER — ACETAMINOPHEN 325 MG PO TABS
650.0000 mg | ORAL_TABLET | Freq: Four times a day (QID) | ORAL | Status: DC | PRN
  Administered 2022-03-16 – 2022-03-21 (×4): 650 mg via ORAL
  Filled 2022-03-15 (×5): qty 2

## 2022-03-15 MED ORDER — SODIUM CHLORIDE 0.9 % IV SOLN
500.0000 mg | INTRAVENOUS | Status: DC
  Filled 2022-03-15: qty 5

## 2022-03-15 MED ORDER — PIPERACILLIN-TAZOBACTAM 3.375 G IVPB
3.3750 g | Freq: Three times a day (TID) | INTRAVENOUS | Status: DC
  Administered 2022-03-16 – 2022-03-20 (×13): 3.375 g via INTRAVENOUS
  Filled 2022-03-15 (×14): qty 50

## 2022-03-15 MED ORDER — BICTEGRAVIR-EMTRICITAB-TENOFOV 50-200-25 MG PO TABS
1.0000 | ORAL_TABLET | Freq: Every day | ORAL | Status: DC
  Administered 2022-03-16 – 2022-03-21 (×6): 1 via ORAL
  Filled 2022-03-15 (×8): qty 1

## 2022-03-15 MED ORDER — THIAMINE MONONITRATE 100 MG PO TABS
100.0000 mg | ORAL_TABLET | Freq: Every day | ORAL | Status: DC
  Administered 2022-03-16 – 2022-03-22 (×7): 100 mg via ORAL
  Filled 2022-03-15 (×7): qty 1

## 2022-03-15 MED ORDER — VANCOMYCIN HCL 2000 MG/400ML IV SOLN
2000.0000 mg | Freq: Once | INTRAVENOUS | Status: AC
  Administered 2022-03-15: 2000 mg via INTRAVENOUS
  Filled 2022-03-15: qty 400

## 2022-03-15 MED ORDER — HEPARIN SODIUM (PORCINE) 5000 UNIT/ML IJ SOLN
5000.0000 [IU] | Freq: Three times a day (TID) | INTRAMUSCULAR | Status: DC
  Administered 2022-03-16 – 2022-03-17 (×5): 5000 [IU] via SUBCUTANEOUS
  Filled 2022-03-15 (×5): qty 1

## 2022-03-15 MED ORDER — ACETAMINOPHEN 500 MG PO TABS
1000.0000 mg | ORAL_TABLET | Freq: Once | ORAL | Status: AC
  Administered 2022-03-15: 1000 mg via ORAL
  Filled 2022-03-15: qty 2

## 2022-03-15 MED ORDER — SODIUM CHLORIDE 0.9 % IV BOLUS
500.0000 mL | Freq: Once | INTRAVENOUS | Status: AC
  Administered 2022-03-15: 500 mL via INTRAVENOUS

## 2022-03-15 MED ORDER — ONDANSETRON HCL 4 MG/2ML IJ SOLN
4.0000 mg | Freq: Once | INTRAMUSCULAR | Status: AC
  Administered 2022-03-15: 4 mg via INTRAVENOUS
  Filled 2022-03-15: qty 2

## 2022-03-15 MED ORDER — ACETAMINOPHEN 650 MG RE SUPP: 650.0000 mg | Freq: Four times a day (QID) | RECTAL | Status: DC | PRN

## 2022-03-15 MED ORDER — SODIUM CHLORIDE 0.9 % IV BOLUS
1000.0000 mL | Freq: Once | INTRAVENOUS | Status: AC
  Administered 2022-03-15: 1000 mL via INTRAVENOUS

## 2022-03-15 MED ORDER — MIRTAZAPINE 15 MG PO TBDP
15.0000 mg | ORAL_TABLET | Freq: Every day | ORAL | Status: DC
  Administered 2022-03-16 – 2022-03-21 (×7): 15 mg via ORAL
  Filled 2022-03-15 (×9): qty 1

## 2022-03-15 NOTE — ED Notes (Signed)
Spoke with patient after he returned from CT. Patient is still able to answer orientation questions, and answer questions appropriately. Patient is drowsy and fatigued at this time.

## 2022-03-15 NOTE — ED Provider Triage Note (Signed)
  Emergency Medicine Provider Triage Evaluation Note  Michael Chandler , a 36 y.o.male,  was evaluated in triage.  Pt complains of neck pain.  He states that he had neck surgery on 02/2020.  States that recently he is at some increased pain and swelling along the left side of his neck, underneath the jawline.  He has had difficulty swallowing and has been feeling lightheaded for the past week.  He was recently hospitalized in [place] for pneumonia and discharged beginning of November.   Review of Systems  Positive: Neck pain/swelling, fatigue, lightheadedness Negative: Denies fever, chest pain, vomiting  Physical Exam   Vitals:   03/15/22 1541 03/15/22 1543  BP:  105/79  Pulse: (!) 129   Resp: 16   Temp: (!) 101.2 F (38.4 C)   SpO2: 97%    Gen:   Awake, appears unwell.  Very diaphoretic. Resp:  Normal effort  MSK:   Moves extremities without difficulty  Other:  Notable soft tissue swelling along the lateral aspect of the patient's neck.  Medical Decision Making  Given the patient's initial medical screening exam, the following diagnostic evaluation has been ordered. The patient will be placed in the appropriate treatment space, once one is available, to complete the evaluation and treatment. I have discussed the plan of care with the patient and I have advised the patient that an ED physician or mid-level practitioner will reevaluate their condition after the test results have been received, as the results may give them additional insight into the type of treatment they may need.    Diagnostics: Labs, blood culture, CT soft tissue, CXR  Treatments: IV fluids, ondansetron   Varney Daily, [place] 03/15/22 [phone]

## 2022-03-15 NOTE — ED Notes (Signed)
MD at bedside. 

## 2022-03-15 NOTE — ED Notes (Signed)
PA at bedside with pt in lobby.

## 2022-03-15 NOTE — ED Notes (Signed)
Pt had BM on self in lobby. Pt placed for now in wheelchair with chux underneath. Communicated to flex MD who said pt needs to go to main. Informed CN for next bed.

## 2022-03-15 NOTE — Consult Note (Signed)
PHARMACY -  BRIEF ANTIBIOTIC NOTE   Pharmacy has received consult(s) for vancomycin dosing from an ED provider.  The patient's profile has been reviewed for ht/wt/allergies/indication/available labs.    One time order(s) placed for Vancomycin 2000 mg IV  Further antibiotics/pharmacy consults should be ordered by admitting physician if indicated.                       Thank you, Barrie Folk, PharmD 03/15/2022  4:52 PM

## 2022-03-15 NOTE — Telephone Encounter (Signed)
Called patient, no answer. Left HIPAA compliant voicemail requesting callback.   No emergency contacts or alternate contact info on file.   Sandie Ano, RN

## 2022-03-15 NOTE — ED Notes (Signed)
First nurse note: From home via [facility]   for L neck pain and hurts to swallow or spit since 3 day and cold since 1 week. PNA 1 month ago.  GCS 15, VS 132/73, HR 140 (no EKG done), 99% on RA. Pt tells EMS is anxious. Takes antipsychotics (name unknown) and takes vicoden for chronic pain, also antivirals for HIV.  No CBG done

## 2022-03-15 NOTE — ED Triage Notes (Addendum)
Neck surgery 02/2020.  After surgery, fell in January 2022.  Patient states he thinks there might be infection to his neck hardware.  C/O fever, congestion sinus and chest, c/o difficulty swallowing and also feeling lightheaded x 1 week.  Also c/o increased swelling to neck where hardware is.  Recent hospitalization in [place] for pneumonia, discharged the beginning of November.  AAOx3.  Skin warm and dry. NAD.  Denies current fever. Has been taking OTC cold medications and tylenol.  States cough drops today.

## 2022-03-15 NOTE — ED Provider Notes (Signed)
[facility] Provider Note    Event Date/Time   First MD Initiated Contact with Patient 03/15/22 1630     (approximate)   History   No chief complaint on file.   HPI  Michael Chandler is a 36 y.o. male   Past medical history of HIV with last CD4 count of 80s performed earlier this month in November 2023, history of cervical laminectomy, who presents to the emergency department with fever, cough, myalgias, anterior left-sided neck pain, worsening progressively over the last 1 month.  Of note he was hospitalized at an outside hospital in [place] for pneumonia and discharged at the end of October.  He felt better then, but then towards the beginning of November progressively worsened with the above symptoms.    He states that he was noncompliant with his HIV medications in the past but had been taking them daily for the last 1 month.  His home is in [place] and his family lives here, he returned home earlier this month.  Denies abdominal pain, nausea, vomiting, dysuria.  He denies motor or sensory changes.  He had an episode of fecal incontinence while in the waiting room, he states he had a sudden urge to defecate and leaked stool prior to making it to the bathroom.  He denies numbness to the perineal area.  History was obtained via the patient. I also reviewed an external medical note including a discharge summary dated 03/07/2022 when he was seen for major depression and discharged after being seen by psychiatry.      Physical Exam   Triage Vital Signs: ED Triage Vitals  Enc Vitals Group     BP 03/15/22 1527 94/62     Pulse Rate 03/15/22 1527 (!) 124     Resp 03/15/22 1527 18     Temp 03/15/22 1527 (!) 88.5 F (31.4 C)     Temp Source 03/15/22 1527 Oral     SpO2 03/15/22 1527 97 %     Weight 03/15/22 1543 194 lb 14.2 oz (88.4 kg)     Height 03/15/22 1543 6\' 5"  (1.956 m)     Head Circumference --      Peak Flow --      Pain Score 03/15/22  1542 8     Pain Loc --      Pain Edu? --      Excl. in GC? --     Most recent vital signs: Vitals:   03/15/22 1740 03/15/22 1800  BP:  117/75  Pulse:    Resp:  (!) 21  Temp: 99 F (37.2 C)   SpO2:      General: Awake, no distress.  Neck is supple, L submandibular mass w tenderness and no obvious fluctuance, posterior oropharynx appears normal without masses, uvula is midline, phonation is normal, maintaining secretions, breathing comfortably. CV:  Tachycardic to 129 and blood pressure is 100 over 70s. Resp:  Normal effort.  Lungs clear. Abd:  No distention.  Nontender to palpation. Other:  Alert awake oriented and pleasant conversant.   ED Results / Procedures / Treatments   Labs (all labs ordered are listed, but only abnormal results are displayed) Labs Reviewed  CBC WITH DIFFERENTIAL/PLATELET - Abnormal; Notable for the following components:      Result Value   WBC 19.9 (*)    RBC 3.44 (*)    Hemoglobin 8.8 (*)    HCT 27.8 (*)    MCH 25.6 (*)    RDW 16.6 (*)  Platelets 455 (*)    Neutro Abs 13.2 (*)    Monocytes Absolute 3.2 (*)    Abs Immature Granulocytes 0.16 (*)    All other components within normal limits  COMPREHENSIVE METABOLIC PANEL - Abnormal; Notable for the following components:   Sodium 132 (*)    Calcium 8.8 (*)    Total Protein 8.3 (*)    Albumin 2.8 (*)    Alkaline Phosphatase 346 (*)    All other components within normal limits  RESP PANEL BY RT-PCR (FLU A&B, COVID) ARPGX2  CULTURE, BLOOD (ROUTINE X 2)  CULTURE, BLOOD (ROUTINE X 2)  URINE CULTURE  LACTIC ACID, PLASMA  LACTIC ACID, PLASMA  PATHOLOGIST SMEAR REVIEW  URINALYSIS, COMPLETE (UACMP) WITH MICROSCOPIC  MONONUCLEOSIS SCREEN     I reviewed labs and they are notable for he has an elevated white blood cell count of 19.9.  EKG  ED ECG REPORT I, Pilar Jarvis, the attending physician, personally viewed and interpreted this ECG.   Date: 03/15/2022  EKG Time: 1800  Rate: 100   Rhythm: sinus tachycardia  Axis: nl  Intervals:none  ST&T Change: no ischemic changes    RADIOLOGY I independently reviewed and interpreted cxr and no obvious focalities   PROCEDURES:  Critical Care performed: Yes, see critical care procedure note(s)  .Critical Care  Performed by: Pilar Jarvis, MD Authorized by: Pilar Jarvis, MD   Critical care provider statement:    Critical care time (minutes):  30   Critical care was necessary to treat or prevent imminent or life-threatening deterioration of the following conditions:  Sepsis   Critical care was time spent personally by me on the following activities:  Development of treatment plan with patient or surrogate, discussions with consultants, evaluation of patient's response to treatment, examination of patient, ordering and review of laboratory studies, ordering and review of radiographic studies, ordering and performing treatments and interventions, pulse oximetry, re-evaluation of patient's condition and review of old charts    MEDICATIONS ORDERED IN ED: Medications  vancomycin (VANCOREADY) IVPB 2000 mg/400 mL (2,000 mg Intravenous New Bag/Given 03/15/22 1740)  azithromycin (ZITHROMAX) 500 mg in sodium chloride 0.9 % 250 mL IVPB (500 mg Intravenous New Bag/Given 03/15/22 1736)  acetaminophen (TYLENOL) tablet 1,000 mg (1,000 mg Oral Given 03/15/22 1550)  sodium chloride 0.9 % bolus 1,000 mL (0 mLs Intravenous Stopped 03/15/22 1807)  ondansetron (ZOFRAN) injection 4 mg (4 mg Intravenous Given 03/15/22 1653)  piperacillin-tazobactam (ZOSYN) IVPB 3.375 g (0 g Intravenous Stopped 03/15/22 1727)  sodium chloride 0.9 % bolus 1,000 mL (1,000 mLs Intravenous New Bag/Given 03/15/22 1727)  iohexol (OMNIPAQUE) 300 MG/ML solution 75 mL (75 mLs Intravenous Contrast Given 03/15/22 1711)  sodium chloride 0.9 % bolus 500 mL (500 mLs Intravenous New Bag/Given 03/15/22 1728)    Consultants:  I spoke with pharmacy consultant for antibiotic  recommendations and regarding care plan for this patient.   IMPRESSION / MDM / ASSESSMENT AND PLAN / ED COURSE  I reviewed the triage vital signs and the nursing notes.                              Differential diagnosis includes, but is not limited to, sepsis, viral respiratory infection, bacterial pneumonia, deep space neck infection, abscess, spinal epidural abscess, other sources of infection like urinary tract infection, intra-abdominal infection   The patient is on the cardiac monitor to evaluate for evidence of arrhythmia and/or significant heart rate changes.  MDM: This is a immunocompromised patient with a very low CD4 count who presents with fever, tachycardia, and respiratory infectious symptoms ongoing for the past 1 month.  He also has some pain in his anterior neck concerning for abscess or other infectious etiologies versus reactive lymph nodes, obtaining a CT soft tissue of the neck for that complaint.  Also getting sepsis evaluation with blood cultures, lactic, basic labs, urinalysis, chest x-ray and viral swabs.  He was ordered for 30 cc/kg sepsis bolus.  I ordered for broad-spectrum antibiotics with vancomycin and Zosyn initially and consulted our pharmacist for further antibiotic recommendations broad-spectrum for this immunocompromised patient with CD4 count in the 80s, allergy to Bactrim.  I am awaiting his recommendations (add azithromycin for atypical coverage.)  Tylenol was given at triage.  I considered spinal epidural abscess with his neck pain and incontinence, however the pain is truly isolated to the left submandibular area, he has no midline C-spine tenderness nor complaints of pain in that area, his neurological exam is normal with no perineal sensory loss, no motor or sensory losses, and his incontinence did not seem to align with spinal cord pathology as he simply did not make it to the bathroom on time with an episode of diarrhea.  CT scan shows very large lymph  nodes in the left submandibular area which is the area of the patient's pain and mass on exam, differential diagnosis for this could be reactive lymph nodes to infection, abscess, lymphoma.  I consulted ENT consultant Dr. Rockne Menghini regarding these findings, he will review imaging and see the patient and will follow-up with recommendations.   --- Sepsis re--evaluation : I reevaluated the patient at this time who is ongoing sepsis evaluation and has gotten IV fluids, remains normotensive, alert and oriented and heart rate has come down from 120s now to 100. --- Pt to be admitted- spoke w Dr Posey Pronto.  When Dr. Posey Pronto examined the patient after admission, she noted him to be lethargic which is a change in mentation so a stat CT head was ordered.  Fortunately the CT head was negative for acute intracranial pathology and noted some paranasal sinus disease.  MRI of the cervical, thoracic, lumbar spine was ordered to assess for discitis.  Patient's presentation is most consistent with acute presentation with potential threat to life or bodily function.       FINAL CLINICAL IMPRESSION(S) / ED DIAGNOSES   Final diagnoses:  Immunocompromised state ([place])  Fever, unspecified fever cause  Subacute cough  Nasal congestion  Neck pain     Rx / DC Orders   ED Discharge Orders     None        Note:  This document was prepared using Dragon voice recognition software and may include unintentional dictation errors.    Lucillie Garfinkel, MD 03/15/22 [phone]

## 2022-03-15 NOTE — ED Notes (Signed)
Pt was able to clean himself up after having a bowel movement in the lobby.  He was provided wipes and clean gown. Pt was able to ambulate to stretcher independently.

## 2022-03-15 NOTE — H&P (Signed)
History and Physical    Chief Complaint: Neck pain    HISTORY OF PRESENT ILLNESS: Michael Chandler is an 36 y.o. male  HIV with last CD4 count of 80s performed earlier this month in November 2023, history of cervical laminectomy,  Pt reported concern for infection in his neck hardware  and has had fever cough, dizziness and increased swelling to the neck along with pain.  Patient was recently hospitalized in [place] for pneumonia and discharged in early November.  In the lobby patient had a BM and soiled himself from fecal incontinence he had a sudden urge to defecate and leaked stool prior to making it to the restroom.  No reports of saddle anesthesia.  Patient was also admitted for major depression and behavioral unit after being seen by psychiatry.  HPI as per chart review to me patient was lethargic somnolent on exam.  Head CT negative. In the emergency room patient met sepsis criteria leukocytosis tachycardia hypoxia. Patient was also anemic with a hemoglobin of 8.8.  Pt has PMH as below: Past Medical History:  Diagnosis Date   HIV (human immunodeficiency virus infection) ([place])    Schizophrenia ([place])    per IVC paperwork, pt states does not have this dx   Review of Systems  Neurological:        Somnolent.     Allergies  Allergen Reactions   Bactrim [Sulfamethoxazole-Trimethoprim] Anaphylaxis   History reviewed. No pertinent surgical history.    Social History   Socioeconomic History   Marital status: Single    Spouse name: Not on file   Number of children: Not on file   Years of education: Not on file   Highest education level: Not on file  Occupational History   Not on file  Tobacco Use   Smoking status: Former    Types: Cigarettes   Smokeless tobacco: Never   Tobacco comments:    Patient denies using tobacco products (03/13/22)  Substance and Sexual Activity   Alcohol use: Yes    Comment: occasional   Drug use: Yes    Types: Methamphetamines,  Marijuana, Cocaine    Comment: states occasional marijuana and meth use   Sexual activity: Not Currently    Comment: condoms accepted  Other Topics Concern   Not on file  Social History Narrative   Not on file   Social Determinants of Health   Financial Resource Strain: Not on file  Food Insecurity: No Food Insecurity (03/16/2022)   Hunger Vital Sign    Worried About Running Out of Food in the Last Year: Never true    Ran Out of Food in the Last Year: Never true  Recent Concern: [place] Present (03/02/2022)   Hunger Vital Sign    Worried About Running Out of Food in the Last Year: Not on file    Ran Out of Food in the Last Year: Sometimes true  Transportation Needs: No Transportation Needs (03/16/2022)   PRAPARE - Hydrologist (Medical): No    Lack of Transportation (Non-Medical): No  Physical Activity: Not on file  Stress: Not on file  Social Connections: Not on file      CURRENT MEDS:        Current Facility-Administered Medications (Analgesics):    acetaminophen (TYLENOL) tablet 650 mg **OR** acetaminophen (TYLENOL) suppository 650 mg   Current Facility-Administered Medications (Hematological):    heparin injection 5,000 Units   Current Facility-Administered Medications (Other):    azithromycin (  ZITHROMAX) 500 mg in sodium chloride 0.9 % 250 mL IVPB   bictegravir-emtricitabine-tenofovir AF (BIKTARVY) 50-200-25 MG per tablet 1 tablet   fluconazole (DIFLUCAN) IVPB 200 mg   lactated ringers infusion   LORazepam (ATIVAN) injection 0.5 mg   mirtazapine (REMERON SOL-TAB) disintegrating tablet 15 mg   piperacillin-tazobactam (ZOSYN) IVPB 3.375 g   thiamine (VITAMIN B1) tablet 100 mg   vancomycin (VANCOREADY) IVPB 1250 mg/250 mL  No current outpatient medications on file.    ED Course: Pt in Ed patient is lethargic oriented. Vitals:   03/15/22 2000 03/15/22 2111 03/15/22 2245 03/15/22 2357  BP: 101/66  102/71 106/72   Pulse: 87 90 93 93  Resp: _0 Temp:    97.8 F (36.6 C)  TempSrc:    Oral  SpO2: 100% 100% 98% 98%  Weight:      Height:       Total I/O In: 358.6 [IV Piggyback:358.6] Out: 680 [Urine:680] SpO2: 98 % Blood work in ed shows leukocytosis with a white count of 19.9 hemoglobin of 8.8 thrombocytosis of 455, sodium 132, normal creatinine, elevated alk phos at 346, negative for flu and COVID, normal lactic acid.  Urinalysis negative, ammonia mildly elevated at 39, Results for orders placed or performed during the hospital encounter of 03/15/22 (from the past 48 hour(s))  CBC with Differential     Status: Abnormal   Collection Time: 03/15/22  3:48 PM  Result Value Ref Range   WBC 19.9 (H) 4.0 - 10.5 K/uL   RBC 3.44 (L) 4.22 - 5.81 MIL/uL   Hemoglobin 8.8 (L) 13.0 - 17.0 g/dL   HCT 27.8 (L) 39.0 - 52.0 %   MCV 80.8 80.0 - 100.0 fL   MCH 25.6 (L) 26.0 - 34.0 pg   MCHC 31.7 30.0 - 36.0 g/dL   RDW 16.6 (H) 11.5 - 15.5 %   Platelets 455 (H) 150 - 400 K/uL   nRBC 0.0 0.0 - 0.2 %   Neutrophils Relative % 66 %   Neutro Abs 13.2 (H) 1.7 - 7.7 K/uL   Lymphocytes Relative 16 %   Lymphs Abs 3.2 0.7 - 4.0 K/uL   Monocytes Relative 16 %   Monocytes Absolute 3.2 (H) 0.1 - 1.0 K/uL   Eosinophils Relative 1 %   Eosinophils Absolute 0.1 0.0 - 0.5 K/uL   Basophils Relative 0 %   Basophils Absolute 0.1 0.0 - 0.1 K/uL   WBC Morphology MORPHOLOGY UNREMARKABLE    Smear Review Normal platelet morphology    Immature Granulocytes 1 %   Abs Immature Granulocytes 0.16 (H) 0.00 - 0.07 K/uL   Polychromasia PRESENT     Comment: Performed at [facility], [address] [place], [place] [address]  Comprehensive metabolic panel     Status: Abnormal   Collection Time: 03/15/22  3:48 PM  Result Value Ref Range   Sodium 132 (L) 135 - 145 mmol/L   Potassium 4.7 3.5 - 5.1 mmol/L   Chloride 102 98 - 111 mmol/L   CO2 22 22 - 32 mmol/L   Glucose, Bld 99 70 - 99 mg/dL     Comment: Glucose reference range applies only to samples taken after fasting for at least 8 hours.   BUN 18 6 - 20 mg/dL   Creatinine, Ser 0.93 0.61 - 1.24 mg/dL   Calcium 8.8 (L) 8.9 - 10.3 mg/dL   Total Protein 8.3 (H) 6.5 - 8.1 g/dL   Albumin 2.8 (L) 3.5 - 5.0 g/dL  AST 29 15 - 41 U/L   ALT 26 0 - 44 U/L   Alkaline Phosphatase 346 (H) 38 - 126 U/L   Total Bilirubin 0.5 0.3 - 1.2 mg/dL   GFR, Estimated >60 >60 mL/min    Comment: (NOTE) Calculated using the CKD-EPI Creatinine Equation (2021)    Anion gap 8 5 - 15    Comment: Performed at [facility], [address] [place], [place] [address]  Resp Panel by RT-PCR (Flu A&B, Covid) Anterior Nasal Swab     Status: None   Collection Time: 03/15/22  3:48 PM   Specimen: Anterior Nasal Swab  Result Value Ref Range   SARS Coronavirus 2 by RT PCR NEGATIVE NEGATIVE    Comment: (NOTE) SARS-CoV-2 target nucleic acids are NOT DETECTED.  The SARS-CoV-2 RNA is generally detectable in upper respiratory specimens during the acute phase of infection. The lowest concentration of SARS-CoV-2 viral copies this assay can detect is 138 copies/mL. A negative result does not preclude SARS-Cov-2 infection and should not be used as the sole basis for treatment or other patient management decisions. A negative result may occur with  improper specimen collection/handling, submission of specimen other than nasopharyngeal swab, presence of viral mutation(s) within the areas targeted by this assay, and inadequate number of viral copies(<138 copies/mL). A negative result must be combined with clinical observations, patient history, and epidemiological information. The expected result is Negative.  Fact Sheet for Patients:  EntrepreneurPulse.com.au  Fact Sheet for Healthcare Providers:  IncredibleEmployment.be  This test is no t yet approved or cleared by the [place] FDA and  has been authorized for  detection and/or diagnosis of SARS-CoV-2 by FDA under an Emergency Use Authorization (EUA). This EUA will remain  in effect (meaning this test can be used) for the duration of the COVID-19 declaration under Section 564(b)(1) of the Act, 21 U.S.C.section 360bbb-3(b)(1), unless the authorization is terminated  or revoked sooner.       Influenza A by PCR NEGATIVE NEGATIVE   Influenza B by PCR NEGATIVE NEGATIVE    Comment: (NOTE) The Xpert Xpress SARS-CoV-2/FLU/RSV plus assay is intended as an aid in the diagnosis of influenza from Nasopharyngeal swab specimens and should not be used as a sole basis for treatment. Nasal washings and aspirates are unacceptable for Xpert Xpress SARS-CoV-2/FLU/RSV testing.  Fact Sheet for Patients: EntrepreneurPulse.com.au  Fact Sheet for Healthcare Providers: IncredibleEmployment.be  This test is not yet approved or cleared by the [place] FDA and has been authorized for detection and/or diagnosis of SARS-CoV-2 by FDA under an Emergency Use Authorization (EUA). This EUA will remain in effect (meaning this test can be used) for the duration of the COVID-19 declaration under Section 564(b)(1) of the Act, 21 U.S.C. section 360bbb-3(b)(1), unless the authorization is terminated or revoked.  Performed at [facility], [address] [place], [place] [address]   Lactic acid, plasma     Status: None   Collection Time: 03/15/22  4:34 PM  Result Value Ref Range   Lactic Acid, Venous 0.9 0.5 - 1.9 mmol/L    Comment: Performed at [facility], [address] [place], [place] [address]  Urinalysis, Complete w Microscopic Urine, Clean Catch     Status: Abnormal   Collection Time: 03/15/22  6:10 PM  Result Value Ref Range   Color, Urine YELLOW (A) YELLOW   APPearance CLEAR (A) CLEAR   Specific Gravity, Urine >1.046 (H) 1.005 - 1.030   pH 6.0 5.0 - 8.0  Glucose, UA NEGATIVE NEGATIVE mg/dL   Hgb  urine dipstick NEGATIVE NEGATIVE   Bilirubin Urine NEGATIVE NEGATIVE   Ketones, ur NEGATIVE NEGATIVE mg/dL   Protein, ur 100 (A) NEGATIVE mg/dL   Nitrite NEGATIVE NEGATIVE   Leukocytes,Ua NEGATIVE NEGATIVE   RBC / HPF 6-10 0 - 5 RBC/hpf   WBC, UA 0-5 0 - 5 WBC/hpf   Bacteria, UA NONE SEEN NONE SEEN   Squamous Epithelial / LPF NONE SEEN 0 - 5   Mucus PRESENT     Comment: Performed at [facility], [address] [place], [place] [address]  Lactic acid, plasma     Status: None   Collection Time: 03/15/22  6:58 PM  Result Value Ref Range   Lactic Acid, Venous 0.6 0.5 - 1.9 mmol/L    Comment: Performed at [facility], [address] [place], [place] [address]  Mononucleosis screen     Status: None   Collection Time: 03/15/22  6:58 PM  Result Value Ref Range   Mono Screen NEGATIVE NEGATIVE    Comment: Performed at [facility], [address] [place], [place] [address]  Ammonia     Status: Abnormal   Collection Time: 03/16/22 12:36 AM  Result Value Ref Range   Ammonia 39 (H) 9 - 35 umol/L    Comment: Performed at [facility], [address] [place], [place] [address]  CBC     Status: Abnormal   Collection Time: 03/16/22 12:36 AM  Result Value Ref Range   WBC 13.7 (H) 4.0 - 10.5 K/uL   RBC 3.34 (L) 4.22 - 5.81 MIL/uL   Hemoglobin 8.5 (L) 13.0 - 17.0 g/dL   HCT 27.6 (L) 39.0 - 52.0 %   MCV 82.6 80.0 - 100.0 fL   MCH 25.4 (L) 26.0 - 34.0 pg   MCHC 30.8 30.0 - 36.0 g/dL   RDW 16.8 (H) 11.5 - 15.5 %   Platelets 396 150 - 400 K/uL   nRBC 0.0 0.0 - 0.2 %    Comment: Performed at [facility], [address] [place], [place] [address]  Creatinine, serum     Status: None   Collection Time: 03/16/22 12:36 AM  Result Value Ref Range   Creatinine, Ser 0.92 0.61 - 1.24 mg/dL   GFR, Estimated >60 >60 mL/min    Comment: (NOTE) Calculated using the CKD-EPI Creatinine Equation (2021) Performed at [facility], [address] [place], [place] [address]     In Ed pt received  Meds ordered this encounter  Medications   acetaminophen (TYLENOL) tablet 1,000 mg   sodium chloride 0.9 % bolus 1,000 mL   ondansetron (ZOFRAN) injection 4 mg   piperacillin-tazobactam (ZOSYN) IVPB 3.375 g    Order Specific Question:   Antibiotic Indication:    Answer:   Other Indication (list below)   sodium chloride 0.9 % bolus 1,000 mL   vancomycin (VANCOREADY) IVPB 2000 mg/400 mL    Order Specific Question:   Indication:    Answer:   Sepsis   iohexol (OMNIPAQUE) 300 MG/ML solution 75 mL   sodium chloride 0.9 % bolus 500 mL   azithromycin (ZITHROMAX) 500 mg in sodium chloride 0.9 % 250 mL IVPB    Order Specific Question:   Antibiotic Indication:    Answer:   Other Indication (list below)   azithromycin (ZITHROMAX) 500 mg in sodium chloride 0.9 % 250 mL IVPB    Order Specific Question:   Antibiotic Indication:  Answer:   Other Indication (list below)   piperacillin-tazobactam (ZOSYN) IVPB 3.375 g    Order Specific Question:   Antibiotic Indication:    Answer:   Other Indication (list below)   bictegravir-emtricitabine-tenofovir AF (BIKTARVY) 50-200-25 MG per tablet 1 tablet   mirtazapine (REMERON SOL-TAB) disintegrating tablet 15 mg   heparin injection 5,000 Units   lactated ringers infusion   thiamine (VITAMIN B1) tablet 100 mg   OR Linked Order Group    acetaminophen (TYLENOL) tablet 650 mg    acetaminophen (TYLENOL) suppository 650 mg   LORazepam (ATIVAN) injection 0.5 mg   vancomycin (VANCOREADY) IVPB 1250 mg/250 mL    Order Specific Question:   Indication:    Answer:   Pneumonia   fluconazole (DIFLUCAN) IVPB 200 mg    Unresulted Labs (From admission, onward)     Start     Ordered   03/15/22 1702  Urine Culture  (Undifferentiated presentation (screening labs and basic nursing orders))  ONCE - URGENT,   URGENT       Question:  Indication  Answer:  Sepsis   03/15/22 1702   03/15/22 1626  Culture, blood  (routine x 2)  BLOOD CULTURE X 2,   STAT      03/15/22 1625   03/15/22 1548  Pathologist smear review  Once,   R        03/15/22 1548            Admission Imaging : CT HEAD WO CONTRAST (5MM)  Result Date: 03/15/2022 CLINICAL DATA:  Altered mental status. EXAM: CT HEAD WITHOUT CONTRAST TECHNIQUE: Contiguous axial images were obtained from the base of the skull through the vertex without intravenous contrast. RADIATION DOSE REDUCTION: This exam was performed according to the departmental dose-optimization program which includes automated exposure control, adjustment of the mA and/or kV according to patient size and/or use of iterative reconstruction technique. COMPARISON:  Brain MRI dated 09/29/2021. FINDINGS: Brain: The ventricles and sulci are appropriate size for the patient's age. The gray-white matter discrimination is preserved. There is no acute intracranial hemorrhage. No mass effect or midline shift. No extra-axial fluid collection. Vascular: No hyperdense vessel or unexpected calcification. Skull: Normal. Negative for fracture or focal lesion. Sinuses/Orbits: Diffuse mucoperiosteal thickening of paranasal sinuses with opacification of several ethmoid air cells as well as opacification of the left frontal and sphenoid sinuses. There is left mastoid effusions. Other: None IMPRESSION: 1. No acute intracranial pathology. 2. Paranasal sinus disease. Left mastoid effusions. Electronically Signed   By: Anner Crete M.D.   On: 03/15/2022 20:21   CT Soft Tissue Neck W Contrast  Addendum Date: 03/15/2022   ADDENDUM REPORT: 03/15/2022 20:08 ADDENDUM: There is a dictation error within the final line of the impression, which should read Emphysema (ICD10-J43.9) rather than Aortic Atherosclerosis (ICD10-I70.0). Electronically Signed   By: Kellie Simmering D.O.   On: 03/15/2022 20:08   Result Date: 03/15/2022 CLINICAL DATA:  Provided history: Soft tissue infection suspected, neck, x-ray done. EXAM: CT  NECK WITH CONTRAST TECHNIQUE: Multidetector CT imaging of the neck was performed using the standard protocol following the bolus administration of intravenous contrast. RADIATION DOSE REDUCTION: This exam was performed according to the departmental dose-optimization program which includes automated exposure control, adjustment of the mA and/or kV according to patient size and/or use of iterative reconstruction technique. CONTRAST:  80m OMNIPAQUE IOHEXOL 300 MG/ML  SOLN COMPARISON:  None. FINDINGS: Pharynx and larynx: Streak and beam hardening artifact arising from dental restoration partially obscures the  oral cavity. Within this limitation, there is no appreciable swelling or mass within the oral cavity, pharynx or larynx. No retropharyngeal collection. Salivary glands: The left submandibular gland appears anteriorly displaced by extensive conglomerate lymphadenopathy. The bilateral parotid and submandibular glands are otherwise unremarkable. Thyroid: Unremarkable. Lymph nodes: There is extensive lymphadenopathy throughout the left neck, much of which is conglomerate lymphadenopathy. Notably, an enlarged lymph node within the left lower neck has internal hypodensity, which may reflect suppuration or cystic/necrotic change (for instance as seen on series 6, image 74). A fat plane is poorly delineated between portions of this conglomerate lymphadenopathy and the left sternocleidomastoid muscle. A dominant conglomerate nodal mass measures 10.0 x 5.8 cm (for instance as seen on series 5, image 129). Vascular: The caudal left internal jugular vein is effaced by lymphadenopathy. The major vascular structures of the neck otherwise appear patent. Limited intracranial: No evidence of acute intracranial abnormality within the field of view. Visualized orbits: No orbital mass or acute orbital finding. Mastoids and visualized paranasal sinuses: Extensive partial opacification of the left frontal sinus due to the presence of  mucosal thickening and fluid. Partial opacification of the left greater than right ethmoid air cells due to the presence of mucosal thickening and fluid. Near complete opacification of the left sphenoid sinus to the presence of mucosal thickening and fluid. Moderate to severe partial opacification of the bilateral maxillary sinuses due to the presence of mucosal thickening and fluid. Left mastoid effusion. Skeleton: ACDF hardware spanning the C4-C7 levels. Corpectomy cages are present at C5 and C6. A posterior spinal fusion construct spans the C3-T1 levels. Prior posterior decompression at the C3-C4 through C7-T1 levels. No acute fracture or aggressive osseous lesion. Upper chest: Centrilobular and paraseptal emphysema within the imaged lung apices. These results were called by telephone at the time of interpretation on 03/15/2022 at 5:36 pm to provider [facility] , who verbally acknowledged these results. IMPRESSION: Extensive lymphadenopathy throughout the left neck, as described. This lymphadenopathy may be due to an infectious etiology. Alternatively, this could reflect sequelae of lymphoma or nodal metastatic disease. Correlate clinically and consider direct tissue sampling. Lymphadenopathy effaces the caudal left internal jugular vein. Pansinusitis. Large left mastoid effusion. Postsurgical changes to the cervical spine, as described. Aortic Atherosclerosis (ICD10-I70.0). Electronically Signed: By: Kellie Simmering D.O. On: 03/15/2022 17:38   DG Chest 2 View  Result Date: 03/15/2022 CLINICAL DATA:  Fever. Recent pneumonia. Neck pain after neck surgery in 2021. EXAM: CHEST - 2 VIEW COMPARISON:  03/01/2022 FINDINGS: Normal heart size and pulmonary vascularity. No focal airspace disease or consolidation in the lungs. No blunting of costophrenic angles. No pneumothorax. Mediastinal contours appear intact. Postoperative changes in the lower cervical spine. IMPRESSION: No active cardiopulmonary disease.  Electronically Signed   By: Lucienne Capers M.D.   On: 03/15/2022 17:28    Physical Examination: Vitals:   03/15/22 2000 03/15/22 2111 03/15/22 2245 03/15/22 2357  BP: 101/66 102/71 106/72   Pulse: 87 90 93 93  Temp:    97.8 F (36.6 C)  Resp: _0 Height:      Weight:      SpO2: 100% 100% 98% 98%  TempSrc:    Oral  BMI (Calculated):       Physical Exam Vitals reviewed.  Constitutional:      General: He is not in acute distress. HENT:     Head: Normocephalic and atraumatic.     Right Ear: External ear normal.     Left  Ear: External ear normal.     Nose: Nose normal.     Mouth/Throat:     Mouth: Mucous membranes are dry.  Eyes:     Pupils: Pupils are equal, round, and reactive to light.  Neck:     Trachea: Trachea normal.   Abdominal:     General: Bowel sounds are normal. There is no distension.     Palpations: Abdomen is soft. There is no mass.     Tenderness: There is no abdominal tenderness. There is no guarding.     Hernia: No hernia is present.  Lymphadenopathy:     Cervical: Cervical adenopathy present.     Left cervical: Superficial cervical adenopathy, deep cervical adenopathy and posterior cervical adenopathy present.     Assessment and Plan: HIV disease [facility]) Patient started on [place] recently which we will continue. Will also continue patient on azithromycin and vancomycin fluconazole as he meets  sepsis criteria. Pt has allergies to bactrim and will defer to ID.  Sepsis ([place]) Blood pressure 106/72, pulse 93, temperature 97.8 F (36.6 C), temperature source Oral, resp. rate 18, height _0  (1.956 m), weight 88.4 kg, SpO2 98 %. In the emergency room patient meets sepsis criteria with tachycardia white count hypothermia source of infection in his case is presumed to be pneumonia, patient's immunocompromise state and low CD4 count or source not obvious or apparent will follow cultures and imaging patient's MRI of the C-spine T-spine and L-spine is  still pending patient has a history of discitis and is at high risk of discitis and or infection of his hardware in his C-spine.  Pansinusitis Continue patient on Zosyn.  Lymphadenopathy Lymphadenopathy on left side of cervical chain differentials include lymphoma, reactive lymphadenopathy, we will defer to a.m. team for oncology consult and evaluation.  Syphilis Per ID.   Candida esophagitis ([place]) Pt was given diflucan and has not been picked up by pharmacy. We will give singe dose iv .   Major depressive disorder, recurrent severe without psychotic features [facility]) Patient was hospitalized in November for depression and suicidal ideation and was discharged patient. A.m. team to consider psychiatry consult and evaluation patient was somnolent .   DVT prophylaxis:  Heparin  Code Status:  Full code  Family Communication:  [phone] [facility] Phone)   [phone]    Disposition Plan:  Home  Consults called:  Infectious disease: Dr. Tama High a.m. message sent  Admission status: Inpatient  Unit/ Expected LOS: Metabolic telemetry/3 to 4 days.   Para Skeans MD [facility]  6 PM- 2 AM. Please contact me via secure Chat 6 PM-2 AM. [phone] ( Pager ) To contact the [facility] Attending or Consulting provider [place] or covering provider during after hours [place], for this patient.   Check the care team in [facility] and look for a) attending/consulting [facility] provider listed and b) the [facility] team listed Log into [email or web address] and use [place]'s universal password to access. If you do not have the password, please contact the hospital operator. Locate the [facility] provider you are looking for under [facility] and page to a number that you can be directly reached. If you still have difficulty reaching the provider, please page the [facility] (Director on Call) for the Hospitalists listed on amion for assistance. [email or web address] 03/16/2022, 2:39 AM

## 2022-03-15 NOTE — Consult Note (Signed)
42M with HIV, elevated viral load presenting with shortness of breath, possible sepsis.  Also has left neck pain, worse over a 7 day period.   Elevated WBC, acutely elevated compared to 2-3 days ago     On exam, patient is lethargic appearing but answers questions appropriately and follows commands. Does not appear to be in distress  Right neck unremarkable, but left neck is tender with LAD. Not fluctuant, no skin changes  OC/OP: unremarkable  Ears: cerumen impaction bilaterally     CT neck: matted LAD of left level 5--no cellulitis or abscess seen, possible foci of necrosis Pansinus opacification worse on the left, no bony erosion or expansion  Labs reviewed   Ddx for lymphadenopathy in setting of HIV is broad--lymphoma, bacterial, TB, fungal History suggests acute lymphadenitis  Recommend broad spectrum IV antibiotics for lymphadenitis and sinusitis. Unlikely to be cause of sepsis, but possible. Also recommend ID consultation Once acute inflammation resolves, patient will need needle aspiration biopsy

## 2022-03-16 ENCOUNTER — Encounter: Payer: Self-pay | Admitting: Internal Medicine

## 2022-03-16 ENCOUNTER — Inpatient Hospital Stay: Payer: No Typology Code available for payment source

## 2022-03-16 DIAGNOSIS — B20 Human immunodeficiency virus [HIV] disease: Secondary | ICD-10-CM

## 2022-03-16 DIAGNOSIS — J0140 Acute pansinusitis, unspecified: Secondary | ICD-10-CM

## 2022-03-16 DIAGNOSIS — M545 Low back pain, unspecified: Secondary | ICD-10-CM | POA: Diagnosis not present

## 2022-03-16 DIAGNOSIS — R509 Fever, unspecified: Secondary | ICD-10-CM | POA: Diagnosis not present

## 2022-03-16 DIAGNOSIS — B3781 Candidal esophagitis: Secondary | ICD-10-CM | POA: Diagnosis not present

## 2022-03-16 DIAGNOSIS — R591 Generalized enlarged lymph nodes: Secondary | ICD-10-CM

## 2022-03-16 DIAGNOSIS — A419 Sepsis, unspecified organism: Secondary | ICD-10-CM | POA: Diagnosis present

## 2022-03-16 DIAGNOSIS — D6489 Other specified anemias: Secondary | ICD-10-CM | POA: Diagnosis not present

## 2022-03-16 DIAGNOSIS — F332 Major depressive disorder, recurrent severe without psychotic features: Secondary | ICD-10-CM | POA: Diagnosis not present

## 2022-03-16 DIAGNOSIS — M549 Dorsalgia, unspecified: Secondary | ICD-10-CM | POA: Diagnosis not present

## 2022-03-16 LAB — COMPREHENSIVE METABOLIC PANEL
ALT: 21 U/L (ref 0–44)
AST: 26 U/L (ref 15–41)
Albumin: 2.5 g/dL — ABNORMAL LOW (ref 3.5–5.0)
Alkaline Phosphatase: 297 U/L — ABNORMAL HIGH (ref 38–126)
Anion gap: 6 (ref 5–15)
BUN: 12 mg/dL (ref 6–20)
CO2: 21 mmol/L — ABNORMAL LOW (ref 22–32)
Calcium: 8.1 mg/dL — ABNORMAL LOW (ref 8.9–10.3)
Chloride: 103 mmol/L (ref 98–111)
Creatinine, Ser: 0.95 mg/dL (ref 0.61–1.24)
GFR, Estimated: >60 mL/min (ref >60)
Glucose, Bld: 92 mg/dL (ref 70–99)
Potassium: 4.4 mmol/L (ref 3.5–5.1)
Sodium: 130 mmol/L — ABNORMAL LOW (ref 135–145)
Total Bilirubin: 0.5 mg/dL (ref 0.3–1.2)
Total Protein: 7.3 g/dL (ref 6.5–8.1)

## 2022-03-16 LAB — CREATININE, SERUM
Creatinine, Ser: 0.92 mg/dL (ref 0.61–1.24)
GFR, Estimated: >60 mL/min (ref >60)

## 2022-03-16 LAB — CBC
HCT: 27.6 % — ABNORMAL LOW (ref 39.0–52.0)
Hemoglobin: 8.5 g/dL — ABNORMAL LOW (ref 13.0–17.0)
MCH: 25.4 pg — ABNORMAL LOW (ref 26.0–34.0)
MCHC: 30.8 g/dL (ref 30.0–36.0)
MCV: 82.6 fL (ref 80.0–100.0)
Platelets: 396 10*3/uL (ref 150–400)
RBC: 3.34 MIL/uL — ABNORMAL LOW (ref 4.22–5.81)
RDW: 16.8 % — ABNORMAL HIGH (ref 11.5–15.5)
WBC: 13.7 10*3/uL — ABNORMAL HIGH (ref 4.0–10.5)
nRBC: 0 % (ref 0.0–0.2)

## 2022-03-16 LAB — AMMONIA: Ammonia: 39 umol/L — ABNORMAL HIGH (ref 9–35)

## 2022-03-16 MED ORDER — LORAZEPAM 2 MG/ML IJ SOLN
0.5000 mg | Freq: Once | INTRAMUSCULAR | Status: AC
  Administered 2022-03-16: 0.5 mg via INTRAVENOUS
  Filled 2022-03-16: qty 1

## 2022-03-16 MED ORDER — ALPRAZOLAM 0.5 MG PO TABS
0.5000 mg | ORAL_TABLET | Freq: Three times a day (TID) | ORAL | Status: DC | PRN
  Administered 2022-03-16 – 2022-03-21 (×5): 0.5 mg via ORAL
  Filled 2022-03-16 (×5): qty 1

## 2022-03-16 MED ORDER — GADOBUTROL 1 MMOL/ML IV SOLN
9.0000 mL | Freq: Once | INTRAVENOUS | Status: AC | PRN
  Administered 2022-03-16: 9 mL via INTRAVENOUS

## 2022-03-16 MED ORDER — LACTULOSE 10 GM/15ML PO SOLN
10.0000 g | Freq: Two times a day (BID) | ORAL | Status: DC
  Administered 2022-03-16 – 2022-03-22 (×13): 10 g via ORAL
  Filled 2022-03-16 (×13): qty 30

## 2022-03-16 MED ORDER — TRAZODONE HCL 50 MG PO TABS
150.0000 mg | ORAL_TABLET | Freq: Every evening | ORAL | Status: DC | PRN
  Administered 2022-03-17 – 2022-03-19 (×3): 150 mg via ORAL
  Filled 2022-03-16 (×3): qty 1

## 2022-03-16 MED ORDER — VANCOMYCIN HCL 1250 MG/250ML IV SOLN
1250.0000 mg | Freq: Three times a day (TID) | INTRAVENOUS | Status: DC
  Administered 2022-03-16 – 2022-03-17 (×3): 1250 mg via INTRAVENOUS
  Filled 2022-03-16 (×5): qty 250

## 2022-03-16 MED ORDER — FLUCONAZOLE 100 MG PO TABS
100.0000 mg | ORAL_TABLET | Freq: Every day | ORAL | Status: DC
  Filled 2022-03-16: qty 1

## 2022-03-16 MED ORDER — FLUCONAZOLE IN SODIUM CHLORIDE 200-0.9 MG/100ML-% IV SOLN
200.0000 mg | INTRAVENOUS | Status: DC
  Administered 2022-03-16: 200 mg via INTRAVENOUS
  Filled 2022-03-16: qty 100

## 2022-03-16 NOTE — Assessment & Plan Note (Signed)
Blood pressure 106/72, pulse 93, temperature 97.8 F (36.6 C), temperature source Oral, resp. rate 18, height 6\' 5"  (1.956 m), weight 88.4 kg, SpO2 98 %. In the emergency room patient meets sepsis criteria with tachycardia white count hypothermia source of infection in his case is presumed to be pneumonia, patient's immunocompromise state and low CD4 count or source not obvious or apparent will follow cultures and imaging patient's MRI of the C-spine T-spine and L-spine is still pending patient has a history of discitis and is at high risk of discitis and or infection of his hardware in his C-spine.

## 2022-03-16 NOTE — Assessment & Plan Note (Signed)
Lymphadenopathy on left side of cervical chain differentials include lymphoma, reactive lymphadenopathy, we will defer to a.m. team for oncology consult and evaluation.

## 2022-03-16 NOTE — Plan of Care (Signed)

## 2022-03-16 NOTE — Progress Notes (Signed)
PROGRESS NOTE  Michael Chandler  TDD:[id number] DOB: [birth year] DOA: 03/15/2022 PCP: Georgann Housekeeper, MD   Brief Narrative: Patient is a 36 year old male history of HIV with last CD4 count of 80, depression,schizophrenia, recent history of cervical laminectomy presented here with complaint of neck pain, cough, dizziness, increased swelling of the neck.  On presentation, he was lethargic, somnolent, febrile.  Lab work showed leukocytosis, hemoglobin of 9.6.  ENT was consulted for left neck pain.  Found to have tenderness on the left neck with lymphadenopathy.  CT neck showed moderate lymphadenopathy with no cellulitis or abscess, also showed pansinus opacification worse on the left..  Patient was admitted for the management of sepsis secondary to neck lymphadenitis, sinusitis.  ID also consulted.  Assessment & Plan:  Active Problems:   HIV disease (HCC)   Major depressive disorder, recurrent severe without psychotic features (HCC)   Candida esophagitis (HCC)   Syphilis   Lymphadenopathy   Pansinusitis   Sepsis (HCC)  Sepsis: Presented with fever, soft blood pressure, tachycardia, leukocytosis.  Patient is immunocompromised with history of HIV.  Started on broad-spectrum antibiotics.  Follow-up cultures. ID consulted.  Dr. Joylene Draft will see him.  He remains afebrile this morning, blood pressure stable.  Neck pain/lymphadenitis: ENT was consulted for left neck pain.  Found to have tenderness on the left neck with lymphadenopathy.  CT neck showed extensive lymphadenopathy with no cellulitis or abscess, also showed pansinus opacification worse on the left..  Patient was admitted for the management of sepsis secondary to lymphadenitis, sinusitis.   ENT planning for needle aspiration biopsy after resolution of inflammation to rule out malignancy. He has history of cervical laminectomy in 2021.  HIV: On Biktarvy .recent  lab works show Treponema pallidum antibodies, positive hepatitis B surface  antigen also with antibody.  He says he has history of hepatitis B which was treated.  ID following  History of recent Candida esophagitis: Was given Diflucan as an outpatient not picked up yet.  Given a dose of IV Diflucan 200 mg once, continue 100 mg oral daily from tomorrow to complete 14 days course  History of MDD: Patient was hospitalized in November for depression, suicidal ideation.  Currently his mood is stable, remains alert and oriented  Mild elevation of ammonia: Unclear etiology.  Continue low-dose lactulose for now, check ammonia level tomorrow.  Denies any abdominal pain.         DVT prophylaxis:heparin injection 5,000 Units Start: 03/15/22 2330 SCDs Start: 03/15/22 2325     Code Status: Full Code  Family Communication: None at bedside  Patient status:Inpatient  Patient is from :Home  Anticipated discharge WC:[facility]  Estimated DC date:2-3 days   Consultants: ID,ENT  Procedures:None  Antimicrobials:  Anti-infectives (From admission, onward)    Start     Dose/Rate Route Frequency Ordered Stop   03/16/22 1730  azithromycin (ZITHROMAX) 500 mg in sodium chloride 0.9 % 250 mL IVPB        500 mg 250 mL/hr over 60 Minutes Intravenous Every 24 hours 03/15/22 2328     03/16/22 0330  fluconazole (DIFLUCAN) IVPB 200 mg        200 mg 100 mL/hr over 60 Minutes Intravenous Every 24 hours 03/16/22 0230     03/16/22 0300  vancomycin (VANCOREADY) IVPB 1250 mg/250 mL        1,250 mg 166.7 mL/hr over 90 Minutes Intravenous Every 8 hours 03/16/22 0212     03/16/22 0130  bictegravir-emtricitabine-tenofovir AF (BIKTARVY) 50-200-25 MG per tablet 1 tablet  1 tablet Oral Daily 03/15/22 2328     03/16/22 0045  piperacillin-tazobactam (ZOSYN) IVPB 3.375 g        3.375 g 12.5 mL/hr over 240 Minutes Intravenous Every 8 hours 03/15/22 2328     03/15/22 1730  azithromycin (ZITHROMAX) 500 mg in sodium chloride 0.9 % 250 mL IVPB        500 mg 250 mL/hr over 60 Minutes  Intravenous  Once 03/15/22 1715 03/15/22 1852   03/15/22 1700  vancomycin (VANCOREADY) IVPB 2000 mg/400 mL        2,000 mg 200 mL/hr over 120 Minutes Intravenous  Once 03/15/22 1652 03/15/22 1940   03/15/22 1645  piperacillin-tazobactam (ZOSYN) IVPB 3.375 g        3.375 g 100 mL/hr over 30 Minutes Intravenous  Once 03/15/22 1632 03/15/22 1727       Subjective: Patient seen and examined at bedside today.  Hemodynamically stable.  Afebrile during my evaluation.  Blood pressure was good.  He remains alert and oriented, lying on the bed.  He states the pain in the left neck is better than when he came.  Objective: Vitals:   03/15/22 2111 03/15/22 2245 03/15/22 2357 03/16/22 0731  BP: 102/71 106/72  114/79  Pulse: 90 93 93 (!) 110  Resp: 17 13 18 16   Temp:   97.8 F (36.6 C) 99.4 F (37.4 C)  TempSrc:   Oral Oral  SpO2: 100% 98% 98% 100%  Weight:      Height:        Intake/Output Summary (Last 24 hours) at 03/16/2022 0747 Last data filed at 03/16/2022 03/18/2022 Gross per 24 hour  Intake 470.68 ml  Output 1380 ml  Net -909.32 ml   Filed Weights   03/15/22 1543  Weight: 88.4 kg    Examination:  General exam: Overall comfortable, not in distress HEENT: PERRL, mild tenderness, swelling on the left neck consistent with lymphadenopathy Respiratory system:  no wheezes or crackles  Cardiovascular system: S1 & S2 heard, RRR.  Gastrointestinal system: Abdomen is nondistended, soft and nontender. Central nervous system: Alert and oriented Extremities: No edema, no clubbing ,no cyanosis Skin: No rashes, no ulcers,no icterus     Data Reviewed: I have personally reviewed following labs and imaging studies  CBC: Recent Labs  Lab 03/13/22 1108 03/15/22 1548 03/16/22 0036  WBC 22.0* 19.9* 13.7*  NEUTROABS 13,882* 13.2*  --   HGB 9.6* 8.8* 8.5*  HCT 29.3* 27.8* 27.6*  MCV 81.2 80.8 82.6  PLT 562* 455* 396   Basic Metabolic Panel: Recent Labs  Lab 03/13/22 1108  03/15/22 1548 03/16/22 0036  NA 133* 132*  --   K 4.1 4.7  --   CL 96* 102  --   CO2 27 22  --   GLUCOSE 85 99  --   BUN 13 18  --   CREATININE 1.00 0.93 0.92  CALCIUM 9.0 8.8*  --      Recent Results (from the past 240 hour(s))  Resp Panel by RT-PCR (Flu A&B, Covid) Anterior Nasal Swab     Status: None   Collection Time: 03/15/22  3:48 PM   Specimen: Anterior Nasal Swab  Result Value Ref Range Status   SARS Coronavirus 2 by RT PCR NEGATIVE NEGATIVE Final    Comment: (NOTE) SARS-CoV-2 target nucleic acids are NOT DETECTED.  The SARS-CoV-2 RNA is generally detectable in upper respiratory specimens during the acute phase of infection. The lowest concentration of SARS-CoV-2 viral copies this assay  can detect is 138 copies/mL. A negative result does not preclude SARS-Cov-2 infection and should not be used as the sole basis for treatment or other patient management decisions. A negative result may occur with  improper specimen collection/handling, submission of specimen other than nasopharyngeal swab, presence of viral mutation(s) within the areas targeted by this assay, and inadequate number of viral copies(<138 copies/mL). A negative result must be combined with clinical observations, patient history, and epidemiological information. The expected result is Negative.  Fact Sheet for Patients:  BloggerCourse.comhttps://www.fda.gov/media/152166/download  Fact Sheet for Healthcare Providers:  SeriousBroker.ithttps://www.fda.gov/media/152162/download  This test is no t yet approved or cleared by the [place] FDA and  has been authorized for detection and/or diagnosis of SARS-CoV-2 by FDA under an Emergency Use Authorization (EUA). This EUA will remain  in effect (meaning this test can be used) for the duration of the COVID-19 declaration under Section 564(b)(1) of the Act, 21 U.S.C.section 360bbb-3(b)(1), unless the authorization is terminated  or revoked sooner.       Influenza A by PCR NEGATIVE  NEGATIVE Final   Influenza B by PCR NEGATIVE NEGATIVE Final    Comment: (NOTE) The Xpert Xpress SARS-CoV-2/FLU/RSV plus assay is intended as an aid in the diagnosis of influenza from Nasopharyngeal swab specimens and should not be used as a sole basis for treatment. Nasal washings and aspirates are unacceptable for Xpert Xpress SARS-CoV-2/FLU/RSV testing.  Fact Sheet for Patients: BloggerCourse.comhttps://www.fda.gov/media/152166/download  Fact Sheet for Healthcare Providers: SeriousBroker.ithttps://www.fda.gov/media/152162/download  This test is not yet approved or cleared by the [place] FDA and has been authorized for detection and/or diagnosis of SARS-CoV-2 by FDA under an Emergency Use Authorization (EUA). This EUA will remain in effect (meaning this test can be used) for the duration of the COVID-19 declaration under Section 564(b)(1) of the Act, 21 U.S.C. section 360bbb-3(b)(1), unless the authorization is terminated or revoked.  Performed at [facility], [address] [place], [place] [address]   Culture, blood (routine x 2)     Status: None (Preliminary result)   Collection Time: 03/15/22  4:26 PM   Specimen: BLOOD  Result Value Ref Range Status   Specimen Description BLOOD BLOOD LEFT HAND  Final   Special Requests   Final    BOTTLES DRAWN AEROBIC AND ANAEROBIC Blood Culture adequate volume   Culture   Final    NO GROWTH < 24 HOURS Performed at [facility], [address] [place], [place] [address]    Report Status PENDING  Incomplete  Culture, blood (routine x 2)     Status: None (Preliminary result)   Collection Time: 03/15/22  4:31 PM   Specimen: BLOOD  Result Value Ref Range Status   Specimen Description BLOOD BLOOD RIGHT HAND  Final   Special Requests   Final    BOTTLES DRAWN AEROBIC AND ANAEROBIC Blood Culture results may not be optimal due to an excessive volume of blood received in culture bottles   Culture   Final    NO GROWTH < 24 HOURS Performed at  [facility], [address] [place], [place] [address]    Report Status PENDING  Incomplete     Radiology Studies: CT HEAD WO CONTRAST (5MM)  Result Date: 03/15/2022 CLINICAL DATA:  Altered mental status. EXAM: CT HEAD WITHOUT CONTRAST TECHNIQUE: Contiguous axial images were obtained from the base of the skull through the vertex without intravenous contrast. RADIATION DOSE REDUCTION: This exam was performed according to the departmental dose-optimization program which includes automated  exposure control, adjustment of the mA and/or kV according to patient size and/or use of iterative reconstruction technique. COMPARISON:  Brain MRI dated 09/29/2021. FINDINGS: Brain: The ventricles and sulci are appropriate size for the patient's age. The gray-white matter discrimination is preserved. There is no acute intracranial hemorrhage. No mass effect or midline shift. No extra-axial fluid collection. Vascular: No hyperdense vessel or unexpected calcification. Skull: Normal. Negative for fracture or focal lesion. Sinuses/Orbits: Diffuse mucoperiosteal thickening of paranasal sinuses with opacification of several ethmoid air cells as well as opacification of the left frontal and sphenoid sinuses. There is left mastoid effusions. Other: None IMPRESSION: 1. No acute intracranial pathology. 2. Paranasal sinus disease. Left mastoid effusions. Electronically Signed   By: Elgie Collard M.D.   On: 03/15/2022 20:21   CT Soft Tissue Neck W Contrast  Addendum Date: 03/15/2022   ADDENDUM REPORT: 03/15/2022 20:08 ADDENDUM: There is a dictation error within the final line of the impression, which should read Emphysema (ICD10-J43.9) rather than Aortic Atherosclerosis (ICD10-I70.0). Electronically Signed   By: Jackey Loge D.O.   On: 03/15/2022 20:08   Result Date: 03/15/2022 CLINICAL DATA:  Provided history: Soft tissue infection suspected, neck, x-ray done. EXAM: CT NECK WITH CONTRAST TECHNIQUE: Multidetector  CT imaging of the neck was performed using the standard protocol following the bolus administration of intravenous contrast. RADIATION DOSE REDUCTION: This exam was performed according to the departmental dose-optimization program which includes automated exposure control, adjustment of the mA and/or kV according to patient size and/or use of iterative reconstruction technique. CONTRAST:  50mL OMNIPAQUE IOHEXOL 300 MG/ML  SOLN COMPARISON:  None. FINDINGS: Pharynx and larynx: Streak and beam hardening artifact arising from dental restoration partially obscures the oral cavity. Within this limitation, there is no appreciable swelling or mass within the oral cavity, pharynx or larynx. No retropharyngeal collection. Salivary glands: The left submandibular gland appears anteriorly displaced by extensive conglomerate lymphadenopathy. The bilateral parotid and submandibular glands are otherwise unremarkable. Thyroid: Unremarkable. Lymph nodes: There is extensive lymphadenopathy throughout the left neck, much of which is conglomerate lymphadenopathy. Notably, an enlarged lymph node within the left lower neck has internal hypodensity, which may reflect suppuration or cystic/necrotic change (for instance as seen on series 6, image 74). A fat plane is poorly delineated between portions of this conglomerate lymphadenopathy and the left sternocleidomastoid muscle. A dominant conglomerate nodal mass measures 10.0 x 5.8 cm (for instance as seen on series 5, image 129). Vascular: The caudal left internal jugular vein is effaced by lymphadenopathy. The major vascular structures of the neck otherwise appear patent. Limited intracranial: No evidence of acute intracranial abnormality within the field of view. Visualized orbits: No orbital mass or acute orbital finding. Mastoids and visualized paranasal sinuses: Extensive partial opacification of the left frontal sinus due to the presence of mucosal thickening and fluid. Partial  opacification of the left greater than right ethmoid air cells due to the presence of mucosal thickening and fluid. Near complete opacification of the left sphenoid sinus to the presence of mucosal thickening and fluid. Moderate to severe partial opacification of the bilateral maxillary sinuses due to the presence of mucosal thickening and fluid. Left mastoid effusion. Skeleton: ACDF hardware spanning the C4-C7 levels. Corpectomy cages are present at C5 and C6. A posterior spinal fusion construct spans the C3-T1 levels. Prior posterior decompression at the C3-C4 through C7-T1 levels. No acute fracture or aggressive osseous lesion. Upper chest: Centrilobular and paraseptal emphysema within the imaged lung apices. These results were called by  telephone at the time of interpretation on 03/15/2022 at 5:36 pm [facility] , who verbally acknowledged these results. IMPRESSION: Extensive lymphadenopathy throughout the left neck, as described. This lymphadenopathy may be due to an infectious etiology. Alternatively, this could reflect sequelae of lymphoma or nodal metastatic disease. Correlate clinically and consider direct tissue sampling. Lymphadenopathy effaces the caudal left internal jugular vein. Pansinusitis. Large left mastoid effusion. Postsurgical changes to the cervical spine, as described. Aortic Atherosclerosis (ICD10-I70.0). Electronically Signed: By: Jackey Loge D.O. On: 03/15/2022 17:38   DG Chest 2 View  Result Date: 03/15/2022 CLINICAL DATA:  Fever. Recent pneumonia. Neck pain after neck surgery in 2021. EXAM: CHEST - 2 VIEW COMPARISON:  03/01/2022 FINDINGS: Normal heart size and pulmonary vascularity. No focal airspace disease or consolidation in the lungs. No blunting of costophrenic angles. No pneumothorax. Mediastinal contours appear intact. Postoperative changes in the lower cervical spine. IMPRESSION: No active cardiopulmonary disease. Electronically Signed   By: Burman Nieves  M.D.   On: 03/15/2022 17:28    Scheduled Meds:  bictegravir-emtricitabine-tenofovir AF  1 tablet Oral Daily   heparin  5,000 Units Subcutaneous Q8H   LORazepam  0.5 mg Intravenous Once   mirtazapine  15 mg Oral QHS   thiamine  100 mg Oral Daily   Continuous Infusions:  azithromycin     fluconazole (DIFLUCAN) IV 200 mg (03/16/22 0409)   lactated ringers 50 mL/hr at 03/16/22 0354   piperacillin-tazobactam Stopped (03/16/22 0424)   vancomycin 166.7 mL/hr at 03/16/22 0323     LOS: 1 day   Burnadette Pop, MD [facility] P11/23/2023, 7:47 AM

## 2022-03-16 NOTE — Progress Notes (Signed)
   03/16/22 1548  Assess: MEWS Score  Temp (!) 101.2 F (38.4 C) (Tylenol administered)  BP (!) 103/53  MAP (mmHg) 68  Pulse Rate (!) 123  Resp 18  SpO2 100 %  O2 Device Room Air  Assess: MEWS Score  MEWS Temp 1  MEWS Systolic 0  MEWS Pulse 2  MEWS RR 0  MEWS LOC 0  MEWS Score 3  MEWS Score Color Yellow  Assess: if the MEWS score is Yellow or Red  Were vital signs taken at a resting state? Yes  Focused Assessment No change from prior assessment  Does the patient meet 2 or more of the SIRS criteria? Yes  Does the patient have a confirmed or suspected source of infection? Yes  Provider and Rapid Response Notified? Yes (Provider notified.)  MEWS guidelines implemented *See Row Information* Yes  Provider Notification  Provider Name/Title Adhikari, MD  Date Provider Notified 03/16/22  Time Provider Notified 1548  Method of Notification  (Secured CHat)  Notification Reason Other (Comment) (FYI on Fever, administering Tylenol)  Provider response Other (Comment) (acknowledged notification)  Date of Provider Response 03/16/22  Time of Provider Response 1550  Assess: SIRS CRITERIA  SIRS Temperature  1  SIRS Pulse 1  SIRS Respirations  0  SIRS WBC 1  SIRS Score Sum  3

## 2022-03-16 NOTE — Assessment & Plan Note (Signed)
Per ID 

## 2022-03-16 NOTE — Assessment & Plan Note (Signed)
Pt was given diflucan and has not been picked up by pharmacy. We will give singe dose iv .

## 2022-03-16 NOTE — Assessment & Plan Note (Signed)
Patient started on Biktarvy recently which we will continue. Will also continue patient on azithromycin and vancomycin fluconazole as he meets  sepsis criteria. Pt has allergies to bactrim and will defer to ID.

## 2022-03-16 NOTE — Assessment & Plan Note (Signed)
Continue patient on Zosyn.

## 2022-03-16 NOTE — Progress Notes (Signed)
Tried to get pt from the ED, no meds available, pt being admitted.

## 2022-03-16 NOTE — Consult Note (Addendum)
NAME: Michael Chandler  DOB: May 11, 1985  MRN: [id number]  Date/Time: 03/16/2022 11:46 AM  REQUESTING PROVIDER: Dr.Patel Subjective:  REASON FOR CONSULT: AIDS, left cervical lymphadenitis ? Michael Chandler is a 36 y.o. with a history of Polysubstance use, schizophrenia, SI, AIDS , c5-c5 osteodiscitis and epidural abscess and had surgical decompression Nov 2021 MSSA + , fusion cervical spine with hardware on compliant with treatment presents with left sided neck pain, fever  Pt has had multiple hospitalizations in the past 54 months- behaioral health and medical admission In Oct 2023 he was admitted to [facility] in [facility] 10/24-1-0/30 23 with vomiting, malaise, sob and was seen by ID physician- His note is  in Care everywhere.extensive workup for Ois During that hospitalization he was diagnosed with viral pneumonia due to Rhino/adeno virus, ( PJP was ruled out by sputum neg for DFA but he had positive B D glucan at 458 which was thought to be  due to candida esophagitis, LDH 493)  diarrhea due to cryptosporidium and EPEC.  As he had headache he underwent LP on 02/15/22 which was Normal  normal protein and glucose, negative cryptococcus and neg VDRL  Other labs Serum RPR 1:2 TPA positive Beta D glucan was positive at 468 HIV RNA 160,170 CD4 -48 ( 6%) HEPb surface antigen positive Heb B Surface antibody was positive as well Core neg HEPB DNA 661 G6PD N Toxo IgG /IgM neg CT chest upper lobe GGO- emphysematous changes   He was started on biktavy, dapsone and fluconazole and discharged on the same. He was admitted to [facility] [place] in Nov 2023  He had ID out patient appt at  [facility] with Dr.Wallace on 03/13/22. During that visit as he was c/o neck pain the plan was to do MRI  He had also done some labs and as the WBC as high his team was trying to reach patient on 11/21 with no success.   He ended up in [facility] ED on 03/15/22 with fever, cough, left sided neck pain which has been present  for more than a week  Vitals in the ED  03/15/22 15:41  Temp 101.2 F (38.4 C) !  Pulse Rate 129 !  Resp 16  SpO2 97 %    03/15/22  BP 106/72       Latest Reference Range & Units 03/15/22  WBC 4.0 - 10.5 K/uL 19.9 (H)  Hemoglobin 13.0 - 17.0 g/dL 8.8 (L)  HCT 75.6 - 43.3 % 27.8 (L)  Platelets 150 - 400 K/uL 455 (H)  Creatinine 0.61 - 1.24 mg/dL 2.95   CT neck showed Extensive lymphadenopathy throughout the left neck,   Lymphadenopathy effaces the caudal left internal jugular vein.  Pansinusitis.  Large left mastoid effusion.  Blood culture sent and started on vanco, zosyn and azithromycin Seen by ENT- recommend to continue antibiotics , would need aspiration of the LN later  Past Medical History:  Diagnosis Date   HIV (human immunodeficiency virus infection) (HCC)    Schizophrenia (HCC)    per IVC paperwork, pt states does not have this dx   PSH Cervical spine surgery with decompression for infection and fusion Social History   Socioeconomic History   Marital status: Single    Spouse name: Not on file   Number of children: Not on file   Years of education: Not on file   Highest education level: Not on file  Occupational History   Not on file  Tobacco Use   Smoking status: Former  Types: Cigarettes   Smokeless tobacco: Never   Tobacco comments:    Patient denies using tobacco products (03/13/22)  Substance and Sexual Activity   Alcohol use: Yes    Comment: occasional   Drug use: Yes    Types: Methamphetamines, Marijuana, Cocaine    Comment: states occasional marijuana and meth use   Sexual activity: Not Currently    Comment: condoms accepted  Other Topics Concern   Not on file  Social History Narrative   Not on file   Social Determinants of Health   Financial Resource Strain: Not on file  Food Insecurity: No Food Insecurity (03/16/2022)   Hunger Vital Sign    Worried About Running Out of Food in the Last Year: Never true    Ran Out of Food in  the Last Year: Never true  Recent Concern: Food Insecurity - Food Insecurity Present (03/02/2022)   Hunger Vital Sign    Worried About Running Out of Food in the Last Year: Not on file    Ran Out of Food in the Last Year: Sometimes true  Transportation Needs: No Transportation Needs (03/16/2022)   PRAPARE - Administrator, Civil Service (Medical): No    Lack of Transportation (Non-Medical): No  Physical Activity: Not on file  Stress: Not on file  Social Connections: Not on file  Intimate Partner Violence: Not At Risk (03/16/2022)   Humiliation, Afraid, Rape, and Kick questionnaire    Fear of Current or Ex-Partner: No    Emotionally Abused: No    Physically Abused: No    Sexually Abused: No    History reviewed. No pertinent family history. Allergies  Allergen Reactions   Bactrim [Sulfamethoxazole-Trimethoprim] Anaphylaxis   I? Current Facility-Administered Medications  Medication Dose Route Frequency Provider Last Rate Last Admin   acetaminophen (TYLENOL) tablet 650 mg  650 mg Oral Q6H PRN Gertha Calkin, MD       Or   acetaminophen (TYLENOL) suppository 650 mg  650 mg Rectal Q6H PRN Gertha Calkin, MD       azithromycin (ZITHROMAX) 500 mg in sodium chloride 0.9 % 250 mL IVPB  500 mg Intravenous Q24H Gertha Calkin, MD       bictegravir-emtricitabine-tenofovir AF (BIKTARVY) 50-200-25 MG per tablet 1 tablet  1 tablet Oral Daily Gertha Calkin, MD   1 tablet at 03/16/22 0221   [START ON 03/17/2022] fluconazole (DIFLUCAN) tablet 100 mg  100 mg Oral Daily Burnadette Pop, MD       heparin injection 5,000 Units  5,000 Units Subcutaneous Q8H Gertha Calkin, MD   5,000 Units at 03/16/22 0602   lactated ringers infusion   Intravenous Continuous Gertha Calkin, MD 50 mL/hr at 03/16/22 0354 New Bag at 03/16/22 0354   lactulose (CHRONULAC) 10 GM/15ML solution 10 g  10 g Oral BID Burnadette Pop, MD   10 g at 03/16/22 0845   LORazepam (ATIVAN) injection 0.5 mg  0.5 mg Intravenous Once  Gertha Calkin, MD       mirtazapine (REMERON SOL-TAB) disintegrating tablet 15 mg  15 mg Oral QHS Irena Cords V, MD   15 mg at 03/16/22 0221   piperacillin-tazobactam (ZOSYN) IVPB 3.375 g  3.375 g Intravenous Q8H Irena Cords V, MD 12.5 mL/hr at 03/16/22 0858 3.375 g at 03/16/22 0858   thiamine (VITAMIN B1) tablet 100 mg  100 mg Oral Daily Gertha Calkin, MD   100 mg at 03/16/22 0846   vancomycin (VANCOREADY) IVPB 1250  mg/250 mL  1,250 mg Intravenous Q8H Gertha CalkinPatel, Ekta V, MD 166.7 mL/hr at 03/16/22 0323 Restarted at 03/16/22 0323     Abtx:  Anti-infectives (From admission, onward)    Start     Dose/Rate Route Frequency Ordered Stop   03/17/22 1000  fluconazole (DIFLUCAN) tablet 100 mg        100 mg Oral Daily 03/16/22 1131 03/30/22 0959   03/16/22 1730  azithromycin (ZITHROMAX) 500 mg in sodium chloride 0.9 % 250 mL IVPB        500 mg 250 mL/hr over 60 Minutes Intravenous Every 24 hours 03/15/22 2328     03/16/22 0330  fluconazole (DIFLUCAN) IVPB 200 mg  Status:  Discontinued        200 mg 100 mL/hr over 60 Minutes Intravenous Every 24 hours 03/16/22 0230 03/16/22 1131   03/16/22 0300  vancomycin (VANCOREADY) IVPB 1250 mg/250 mL        1,250 mg 166.7 mL/hr over 90 Minutes Intravenous Every 8 hours 03/16/22 0212     03/16/22 0130  bictegravir-emtricitabine-tenofovir AF (BIKTARVY) 50-200-25 MG per tablet 1 tablet        1 tablet Oral Daily 03/15/22 2328     03/16/22 0045  piperacillin-tazobactam (ZOSYN) IVPB 3.375 g        3.375 g 12.5 mL/hr over 240 Minutes Intravenous Every 8 hours 03/15/22 2328     03/15/22 1730  azithromycin (ZITHROMAX) 500 mg in sodium chloride 0.9 % 250 mL IVPB        500 mg 250 mL/hr over 60 Minutes Intravenous  Once 03/15/22 1715 03/15/22 1852   03/15/22 1700  vancomycin (VANCOREADY) IVPB 2000 mg/400 mL        2,000 mg 200 mL/hr over 120 Minutes Intravenous  Once 03/15/22 1652 03/15/22 1940   03/15/22 1645  piperacillin-tazobactam (ZOSYN) IVPB 3.375 g        3.375  g 100 mL/hr over 30 Minutes Intravenous  Once 03/15/22 1632 03/15/22 1727       REVIEW OF SYSTEMS:  Const:fever,  chills,  weight loss Eyes: negative diplopia or visual changes, negative eye pain ENT: negative coryza, negative sore throat Pain and swelling left side of the neck Resp:  cough, no hemoptysis, dyspnea Cards: negative for chest pain, palpitations, lower extremity edema GU: negative for frequency, dysuria and hematuria GI: Negative for abdominal pain, has  diarrhea, bleeding, constipation Skin: negative for rash and pruritus Heme: negative for easy bruising and gum/nose bleeding MS: negative for myalgias, arthralgias, back pain and muscle weakness Neurolo:negative for headaches, dizziness, vertigo, memory problems  Psych: suicidal ideation, voices Endocrine: negative for thyroid, diabetes Allergy/Immunology- sulfa Objective:  VITALS:  BP 114/79 (BP Location: Right Arm)   Pulse (!) 110   Temp 99.4 F (37.4 C) (Oral)   Resp 16   Ht 6\' 5"  (1.956 m)   Wt 88.4 kg   SpO2 100%   BMI 23.11 kg/m  Patient Vitals for the past 24 hrs:  BP Temp Temp src Pulse Resp SpO2 Height Weight  03/16/22 0731 114/79 99.4 F (37.4 C) Oral (!) 110 16 100 % -- --  03/15/22 2357 -- 97.8 F (36.6 C) Oral 93 18 98 % -- --  03/15/22 2245 106/72 -- -- 93 13 98 % -- --  03/15/22 2111 102/71 -- -- 90 17 100 % -- --  03/15/22 2000 101/66 -- -- 87 19 100 % -- --  03/15/22 1919 108/74 98.2 F (36.8 C) Oral 93 20 100 % -- --  03/15/22 1900 98/73 -- -- -- 19 -- -- --  03/15/22 1830 100/63 -- -- -- 19 -- -- --  03/15/22 1800 117/75 -- -- -- (!) 21 -- -- --  03/15/22 1740 -- 99 F (37.2 C) Oral -- -- -- -- --  03/15/22 1730 128/62 -- -- (!) 51 (!) 25 100 % -- --  03/15/22 1700 110/63 -- -- (!) 49 (!) 30 100 % -- --  03/15/22 1543 105/79 -- -- -- -- --  (1.956 m) 88.4 kg  03/15/22 1541 -- (!) 101.2 F (38.4 C) Oral (!) 129 16 97 % -- --  03/15/22 1527 94/62 (!) 88.5 F (31.4 C) Oral (!)  124 18 97 % -- --    PHYSICAL EXAM:  General: Alert, cooperative, no distress, appears stated age.  Head: Normocephalic, without obvious abnormality, atraumatic. Eyes: Conjunctivae clear, anicteric sclerae. Pupils are equal ENT Nares normal. No drainage or sinus tenderness. Lips, mucosa, and tongue normal. No Thrush Neck: Supple, mass over the left side of neck Back: No CVA tenderness. Lungs: Clear to auscultation bilaterally. No Wheezing or Rhonchi. No rales. Heart: Regular rate and rhythm, no murmur, rub or gallop. Abdomen: Soft, non-tender,not distended. Bowel sounds normal. No masses Extremities: atraumatic, no cyanosis. No edema. No clubbing Skin: No rashes or lesions. Or bruising Lymph: Cervical, supraclavicular normal. Neurologic: Grossly non-focal Pertinent Labs Lab Results CBC    Component Value Date/Time   WBC 13.7 (H) 03/16/2022 0036   RBC 3.34 (L) 03/16/2022 0036   HGB 8.5 (L) 03/16/2022 0036   HGB 11.7 (L) 12/18/2021 1445   HCT 27.6 (L) 03/16/2022 0036   HCT 36.0 (L) 12/18/2021 1445   PLT 396 03/16/2022 0036   PLT 239 12/18/2021 1445   MCV 82.6 03/16/2022 0036   MCV 84 12/18/2021 1445   MCH 25.4 (L) 03/16/2022 0036   MCHC 30.8 03/16/2022 0036   RDW 16.8 (H) 03/16/2022 0036   RDW 14.2 12/18/2021 1445   LYMPHSABS 3.2 03/15/2022 1548   LYMPHSABS 1.5 12/18/2021 1445   MONOABS 3.2 (H) 03/15/2022 1548   EOSABS 0.1 03/15/2022 1548   EOSABS 0.1 12/18/2021 1445   BASOSABS 0.1 03/15/2022 1548   BASOSABS 0.0 12/18/2021 1445       Latest Ref Rng & Units 03/16/2022    8:44 AM 03/16/2022   12:36 AM 03/15/2022    3:48 PM  CMP  Glucose 70 - 99 mg/dL 92   99   BUN 6 - 20 mg/dL 12   18   Creatinine 1.61 - 1.24 mg/dL 0.96  0.45  4.09   Sodium 135 - 145 mmol/L 130   132   Potassium 3.5 - 5.1 mmol/L 4.4   4.7   Chloride 98 - 111 mmol/L 103   102   CO2 22 - 32 mmol/L 21   22   Calcium 8.9 - 10.3 mg/dL 8.1   8.8   Total Protein 6.5 - 8.1 g/dL 7.3   8.3   Total  Bilirubin 0.3 - 1.2 mg/dL 0.5   0.5   Alkaline Phos 38 - 126 U/L 297   346   AST 15 - 41 U/L 26   29   ALT 0 - 44 U/L 21   26       Microbiology: Recent Results (from the past 240 hour(s))  Resp Panel by RT-PCR (Flu A&B, Covid) Anterior Nasal Swab     Status: None   Collection Time: 03/15/22  3:48 PM   Specimen: Anterior  Nasal Swab  Result Value Ref Range Status   SARS Coronavirus 2 by RT PCR NEGATIVE NEGATIVE Final    Comment: (NOTE) SARS-CoV-2 target nucleic acids are NOT DETECTED.  The SARS-CoV-2 RNA is generally detectable in upper respiratory specimens during the acute phase of infection. The lowest concentration of SARS-CoV-2 viral copies this assay can detect is 138 copies/mL. A negative result does not preclude SARS-Cov-2 infection and should not be used as the sole basis for treatment or other patient management decisions. A negative result may occur with  improper specimen collection/handling, submission of specimen other than nasopharyngeal swab, presence of viral mutation(s) within the areas targeted by this assay, and inadequate number of viral copies(<138 copies/mL). A negative result must be combined with clinical observations, patient history, and epidemiological information. The expected result is Negative.  Fact Sheet for Patients:  BloggerCourse.com  Fact Sheet for Healthcare Providers:  SeriousBroker.it  This test is no t yet approved or cleared by the [place] FDA and  has been authorized for detection and/or diagnosis of SARS-CoV-2 by FDA under an Emergency Use Authorization (EUA). This EUA will remain  in effect (meaning this test can be used) for the duration of the COVID-19 declaration under Section 564(b)(1) of the Act, 21 U.S.C.section 360bbb-3(b)(1), unless the authorization is terminated  or revoked sooner.       Influenza A by PCR NEGATIVE NEGATIVE Final   Influenza B by PCR NEGATIVE  NEGATIVE Final    Comment: (NOTE) The Xpert Xpress SARS-CoV-2/FLU/RSV plus assay is intended as an aid in the diagnosis of influenza from Nasopharyngeal swab specimens and should not be used as a sole basis for treatment. Nasal washings and aspirates are unacceptable for Xpert Xpress SARS-CoV-2/FLU/RSV testing.  Fact Sheet for Patients: BloggerCourse.com  Fact Sheet for Healthcare Providers: SeriousBroker.it  This test is not yet approved or cleared by the [place] FDA and has been authorized for detection and/or diagnosis of SARS-CoV-2 by FDA under an Emergency Use Authorization (EUA). This EUA will remain in effect (meaning this test can be used) for the duration of the COVID-19 declaration under Section 564(b)(1) of the Act, 21 U.S.C. section 360bbb-3(b)(1), unless the authorization is terminated or revoked.  Performed at [facility], [address] [place], [place] [address]   Culture, blood (routine x 2)     Status: None (Preliminary result)   Collection Time: 03/15/22  4:26 PM   Specimen: BLOOD  Result Value Ref Range Status   Specimen Description BLOOD BLOOD LEFT HAND  Final   Special Requests   Final    BOTTLES DRAWN AEROBIC AND ANAEROBIC Blood Culture adequate volume   Culture   Final    NO GROWTH < 24 HOURS Performed at [facility], [address] [place], [place] [address]    Report Status PENDING  Incomplete  Culture, blood (routine x 2)     Status: None (Preliminary result)   Collection Time: 03/15/22  4:31 PM   Specimen: BLOOD  Result Value Ref Range Status   Specimen Description BLOOD BLOOD RIGHT HAND  Final   Special Requests   Final    BOTTLES DRAWN AEROBIC AND ANAEROBIC Blood Culture results may not be optimal due to an excessive volume of blood received in culture bottles   Culture   Final    NO GROWTH < 24 HOURS Performed at [facility], [address],  [place], [place] [address]    Report Status PENDING  Incomplete  IMAGING RESULTS:  I have personally reviewed the films ?CXR no infiltrate CT neck    Impression/Recommendation ? AIDS- has been non compliant for many years and recenty started taking Biktarvy  Left sided neck pain with extensive painful  lymphadenopathy This looks like acute presentation and being painful goes against TB but because of homeelssness, immunosuppression and resp symptoms check Afb X 3 CXR N Need to r/o infections including upper resp tract R/o Lemierre's Unilateral - EBV. CMV, unlikely ? 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

## 2022-03-16 NOTE — Assessment & Plan Note (Signed)
Patient was hospitalized in November for depression and suicidal ideation and was discharged patient. A.m. team to consider psychiatry consult and evaluation patient was somnolent .

## 2022-03-16 NOTE — Progress Notes (Signed)
Pharmacy Antibiotic Note  Michael Chandler is a 36 y.o. male admitted on 03/15/2022 with pneumonia.  Pharmacy has been consulted for Vancomycin dosing.  Plan: Vancomycin 2 gm IV X 1 given on 11/22 @ 1740. Vancomycin 1250 mg IV Q8H ordered to start on 11/23 @ 0300.  AUC = 483.7 Vanc trough = 14.3   Height: 6\' 5"  (195.6 cm) Weight: 88.4 kg (194 lb 14.2 oz) IBW/kg (Calculated) : 89.1  Temp (24hrs), Avg:96.9 F (36.1 C), Min:88.5 F (31.4 C), Max:101.2 F (38.4 C)  Recent Labs  Lab 03/13/22 1108 03/15/22 1548 03/15/22 1634 03/15/22 1858 03/16/22 0036  WBC 22.0* 19.9*  --   --  13.7*  CREATININE 1.00 0.93  --   --  0.92  LATICACIDVEN  --   --  0.9 0.6  --     Estimated Creatinine Clearance: 141.5 mL/min (by C-G formula based on SCr of 0.92 mg/dL).    Allergies  Allergen Reactions   Bactrim [Sulfamethoxazole-Trimethoprim] Anaphylaxis    Antimicrobials this admission:   >>    >>   Dose adjustments this admission:   Microbiology results:  BCx:   UCx:    Sputum:    MRSA PCR:   Thank you for allowing pharmacy to be a part of this patient's care.  Avedis Bevis D 03/16/2022 2:16 AM

## 2022-03-16 NOTE — ED Notes (Signed)
Patient given juice and able to drink without any difficulties

## 2022-03-17 DIAGNOSIS — R509 Fever, unspecified: Secondary | ICD-10-CM

## 2022-03-17 DIAGNOSIS — B20 Human immunodeficiency virus [HIV] disease: Secondary | ICD-10-CM | POA: Diagnosis not present

## 2022-03-17 DIAGNOSIS — J0140 Acute pansinusitis, unspecified: Secondary | ICD-10-CM | POA: Diagnosis not present

## 2022-03-17 DIAGNOSIS — R591 Generalized enlarged lymph nodes: Secondary | ICD-10-CM | POA: Diagnosis not present

## 2022-03-17 LAB — CBC
HCT: 20.6 % — ABNORMAL LOW (ref 39.0–52.0)
Hemoglobin: 6.6 g/dL — ABNORMAL LOW (ref 13.0–17.0)
MCH: 25.8 pg — ABNORMAL LOW (ref 26.0–34.0)
MCHC: 32 g/dL (ref 30.0–36.0)
MCV: 80.5 fL (ref 80.0–100.0)
Platelets: 429 10*3/uL — ABNORMAL HIGH (ref 150–400)
RBC: 2.56 MIL/uL — ABNORMAL LOW (ref 4.22–5.81)
RDW: 16.2 % — ABNORMAL HIGH (ref 11.5–15.5)
WBC: 12.8 10*3/uL — ABNORMAL HIGH (ref 4.0–10.5)
nRBC: 0.2 % (ref 0.0–0.2)

## 2022-03-17 LAB — URINE CULTURE

## 2022-03-17 LAB — TYPE AND SCREEN
ABO/RH(D): O POS
Antibody Screen: NEGATIVE

## 2022-03-17 LAB — HEMOGLOBIN AND HEMATOCRIT, BLOOD
HCT: 25.8 % — ABNORMAL LOW (ref 39.0–52.0)
Hemoglobin: 8.2 g/dL — ABNORMAL LOW (ref 13.0–17.0)

## 2022-03-17 LAB — COMPREHENSIVE METABOLIC PANEL
ALT: 20 U/L (ref 0–44)
AST: 27 U/L (ref 15–41)
Albumin: 2.3 g/dL — ABNORMAL LOW (ref 3.5–5.0)
Alkaline Phosphatase: 315 U/L — ABNORMAL HIGH (ref 38–126)
Anion gap: 7 (ref 5–15)
BUN: 10 mg/dL (ref 6–20)
CO2: 21 mmol/L — ABNORMAL LOW (ref 22–32)
Calcium: 8.5 mg/dL — ABNORMAL LOW (ref 8.9–10.3)
Chloride: 107 mmol/L (ref 98–111)
Creatinine, Ser: 0.89 mg/dL (ref 0.61–1.24)
GFR, Estimated: >60 mL/min (ref >60)
Glucose, Bld: 110 mg/dL — ABNORMAL HIGH (ref 70–99)
Potassium: 3.6 mmol/L (ref 3.5–5.1)
Sodium: 135 mmol/L (ref 135–145)
Total Bilirubin: 0.5 mg/dL (ref 0.3–1.2)
Total Protein: 7.2 g/dL (ref 6.5–8.1)

## 2022-03-17 LAB — MRSA NEXT GEN BY PCR, NASAL: MRSA by PCR Next Gen: NOT DETECTED

## 2022-03-17 LAB — PATHOLOGIST SMEAR REVIEW

## 2022-03-17 LAB — AMMONIA: Ammonia: 27 umol/L (ref 9–35)

## 2022-03-17 MED ORDER — FLUCONAZOLE 100 MG PO TABS
200.0000 mg | ORAL_TABLET | Freq: Every day | ORAL | Status: DC
  Filled 2022-03-17: qty 1
  Filled 2022-03-17: qty 2

## 2022-03-17 MED ORDER — ENOXAPARIN SODIUM 40 MG/0.4ML IJ SOSY
40.0000 mg | PREFILLED_SYRINGE | INTRAMUSCULAR | Status: DC
  Administered 2022-03-17 – 2022-03-21 (×5): 40 mg via SUBCUTANEOUS
  Filled 2022-03-17 (×5): qty 0.4

## 2022-03-17 MED ORDER — SODIUM CHLORIDE 3 % IN NEBU
4.0000 mL | INHALATION_SOLUTION | Freq: Every day | RESPIRATORY_TRACT | Status: AC
  Administered 2022-03-17 – 2022-03-19 (×3): 4 mL via RESPIRATORY_TRACT
  Filled 2022-03-17 (×3): qty 4

## 2022-03-17 NOTE — Progress Notes (Signed)
Pharmacy Antibiotic Note  Michael Chandler is a 36 y.o. male admitted on 03/15/2022 with pneumonia/sepsis.  Pharmacy has been consulted for Vancomycin dosing.  Plan: Vancomycin 2 gm IV x 1 load , then Vancomycin 1250 mg IV Q8H  Goal AUC 400-550. Expected AUC: 464.9 SCr used: 0.89 Cmax = 30.8, Cmin = 13.5 Will order levels as steady state Monitor cultures results and renal function to adjust therapy as needed.  Height: 6\' 5"  (195.6 cm) Weight: 88.4 kg (194 lb 14.2 oz) IBW/kg (Calculated) : 89.1  Temp (24hrs), Avg:99.3 F (37.4 C), Min:98.3 F (36.8 C), Max:101.2 F (38.4 C)  Recent Labs  Lab 03/13/22 1108 03/15/22 1548 03/15/22 1634 03/15/22 1858 03/16/22 0036 03/16/22 0844 03/17/22 0357  WBC 22.0* 19.9*  --   --  13.7*  --  12.8*  CREATININE 1.00 0.93  --   --  0.92 0.95 0.89  LATICACIDVEN  --   --  0.9 0.6  --   --   --      Estimated Creatinine Clearance: 146.2 mL/min (by C-G formula based on SCr of 0.89 mg/dL).    Allergies  Allergen Reactions   Bactrim [Sulfamethoxazole-Trimethoprim] Anaphylaxis    Antimicrobials this admission:  Zosyn 11/22 >>  Vancomycin 11/22>>   Dose adjustments this admission: N/A  Microbiology results:  11/22 BCx: NG x 2 days  11/22 UCx: in process  11/23 Acid fast Sputum : in process  11/23 MRSA PCR: NOT DETECTED  Thank you for allowing pharmacy to be a part of this patient's care.  Cheril Slattery Rodriguez-Guzman PharmD, BCPS 03/17/2022 8:43 AM

## 2022-03-17 NOTE — Plan of Care (Incomplete)

## 2022-03-17 NOTE — Progress Notes (Signed)
PROGRESS NOTE Daimien Patmon  KLK:[id number] DOB: May 18, 1985 DOA: 03/15/2022 PCP: Wenda Low, MD   Brief Narrative/Hospital Course: 36 year old male history of HIV with last CD4 count of 80, depression,schizophrenia, recent history of cervical laminectomy presented here with complaint of neck pain, cough, dizziness, increased swelling of the neck.  On presentation, he was lethargic, somnolent, febrile.  Lab work showed leukocytosis, hemoglobin of 9.6.  ENT was consulted for left neck pain.  Found to have tenderness on the left neck with lymphadenopathy.  CT neck showed moderate lymphadenopathy with no cellulitis or abscess, also showed pansinus opacification worse on the left..  Patient was admitted for the management of sepsis secondary to neck lymphadenitis, sinusitis.  ID also consulted    Subjective: Seen and examined.  Resting comfortably, still complains of pain with swelling in the left neck Tolerating diet well no complaint no nausea vomiting Overnight Tmax  101.2, afebrile this morning, SBP 98-112, saturating 100% room air. Labs reviewed shows metabolic acidosis bicarb 21, low albumin 2.3 alk phos 315, WBC downtrending 12.8, drop in hemoglobin 6.6 g> repeat H&H STABLE IN 8,and type and screen ordered.  Assessment and Plan: Active Problems:   HIV disease ([place])   Major depressive disorder, recurrent severe without psychotic features ([place])   Candida esophagitis ([facility])   Syphilis   Lymphadenopathy   Pansinusitis   Sepsis ([place])  Sepsis POA with soft blood pressure fever tachycardia and leukocytosis in the setting of immunocompromise status HIV, now on Zosyn as per ID.  Had fever yesterday.  Still has leukocytosis.  Blood culture 11/22 NGTD, urine culture multiple species.  Left neck pain/lymphadenitis : Unclear etiology.given hiv-R/O TB with a sputum AFB x3, continue airborne precaution until then. ID following closely and appreciate input  Normocytic anemia suspect from patient's HIV  repeat H&H 8.2, transfuse for less than milligram.  Monitor.  Check anemia panel AIDS-noncompliant for many years recently started [place].  Continue per ID Left mastoid effusion/pansinusitis: Continue antibiotics Hyponatremia resolved Recent candidal esophagitis resolved Active hepatitis B infection HBs antibody antigen positive low-level DNA in 10/23-per ID Treated syphilis in the past Mildly elevated ammonia unclear etiology.  Continue lactulose History of MDD hospitalized in November for depression and suicidal ideation currently moderate stable  DVT prophylaxis: enoxaparin (LOVENOX) injection 40 mg Start: 03/17/22 2200 SCDs Start: 03/15/22 2325 Code Status:   Code Status: Full Code Family Communication: plan of care discussed with patient at bedside. Patient status is: Inpatient because of left neck lymphadenitis Level of care: Telemetry Medical   Dispo: The patient is from: home             Anticipated disposition: home once cleared by ID and TB rule out Objective: Vitals last 24 hrs: Vitals:   03/16/22 1830 03/16/22 2053 03/17/22 0616 03/17/22 0721  BP:  (!) 112/57 (!) 98/58 (!) 96/56  Pulse: (!) 120 78 96 87  Resp:  _0 Temp: 99 F (37.2 C) 98.7 F (37.1 C) 99.1 F (37.3 C) 98.3 F (36.8 C)  TempSrc: Oral     SpO2:  97% 100% 100%  Weight:      Height:       Weight change:   Physical Examination: General exam: alert awake, older than stated age HEENT:Oral mucosa moist, Ear/Nose WNL grossly, tender swelling on the left neck Respiratory system: bilaterally clear BS, no use of accessory muscle Cardiovascular system: S1 & S2 +, No JVD. Gastrointestinal system: Abdomen soft,NT,ND, BS+ Nervous System:Alert, awake, moving extremities. Extremities: LE edema  neg,distal peripheral pulses palpable.  Skin: No rashes,no icterus. MSK: Normal muscle bulk,tone, power  Medications reviewed:  Scheduled Meds:  bictegravir-emtricitabine-tenofovir AF  1 tablet Oral Daily    enoxaparin (LOVENOX) injection  40 mg Subcutaneous Q24H   lactulose  10 g Oral BID   mirtazapine  15 mg Oral QHS   thiamine  100 mg Oral Daily   Continuous Infusions:  lactated ringers 50 mL/hr at 03/16/22 1558   piperacillin-tazobactam 3.375 g (03/17/22 0029)      Diet Order             DIET SOFT Room service appropriate? Yes; Fluid consistency: Thin  Diet effective now                            Intake/Output Summary (Last 24 hours) at 03/17/2022 1134 Last data filed at 03/17/2022 0940 Gross per 24 hour  Intake 0 ml  Output 2050 ml  Net -2050 ml   Net IO Since Admission: -3,616.18 mL [03/17/22 1134]  Wt Readings from Last 3 Encounters:  03/15/22 88.4 kg  03/13/22 88.5 kg  03/02/22 79.4 kg     Unresulted Labs (From admission, onward)     Start     Ordered   03/17/22 0500  Bartonella antibody panel  Tomorrow morning,   R       Question:  Specimen collection method  Answer:  Lab=Lab collect   03/16/22 1505   03/17/22 0500  Fungitell, Serum (as Misc Send Out)  Tomorrow morning,   R       Comments: Fungitell, Serum has been discontinued by [facility].  The replacement test is Fungitell, Beta-D-Glucan, [facility] Test # [id number]. TESTING WILL BE PERFORMED AT [facility].  RESULT WILL DISPLAY IN RESULTS REVIEW UNDER "MISCELLANEOUS TEST."   Question:  Test name / description:  AnswerLeata Mouse, [facility] Test # [id number]   03/16/22 1505   03/17/22 0500  CMV DNA, quantitative, PCR  Tomorrow morning,   R       Question:  Specimen collection method  Answer:  Lab=Lab collect   03/16/22 1506   03/17/22 0500  Epstein barr vrs(ebv dna by pcr)  Tomorrow morning,   R       Question:  Specimen collection method  Answer:  Lab=Lab collect   03/16/22 1506   03/17/22 0500  QuantiFERON-TB Gold Plus  Tomorrow morning,   R       Question:  Specimen collection method  Answer:  Lab=Lab collect   03/16/22 1507   03/16/22 1506  Acid Fast Culture with reflexed sensitivities  (AFB  smear + Culture w reflexed sensitivities panel)  Now then every 8 hours,   TIMED     See Hyperspace for full Linked Orders Report.   03/16/22 1505   03/16/22 1505  Acid Fast Smear (AFB)  (AFB smear + Culture w reflexed sensitivities panel)  Now then every 8 hours,   TIMED     See Hyperspace for full Linked Orders Report.   03/16/22 1505          Data Reviewed: I have personally reviewed following labs and imaging studies CBC: Recent Labs  Lab 03/13/22 1108 03/15/22 1548 03/16/22 0036 03/17/22 0357 03/17/22 0755  WBC 22.0* 19.9* 13.7* 12.8*  --   NEUTROABS 13,882* 13.2*  --   --   --   HGB 9.6* 8.8* 8.5* 6.6* 8.2*  HCT 29.3* 27.8* 27.6* 20.6* 25.8*  MCV 81.2 80.8  82.6 80.5  --   PLT 562* 455* 396 429*  --    Basic Metabolic Panel: Recent Labs  Lab 03/13/22 1108 03/15/22 1548 03/16/22 0036 03/16/22 0844 03/17/22 0357  NA 133* 132*  --  130* 135  K 4.1 4.7  --  4.4 3.6  CL 96* 102  --  103 107  CO2 27 22  --  21* 21*  GLUCOSE 85 99  --  92 110*  BUN 13 18  --  12 10  CREATININE 1.00 0.93 0.92 0.95 0.89  CALCIUM 9.0 8.8*  --  8.1* 8.5*   GFR: Estimated Creatinine Clearance: 146.2 mL/min (by C-G formula based on SCr of 0.89 mg/dL). Liver Function Tests: Recent Labs  Lab 03/13/22 1108 03/15/22 1548 03/16/22 0844 03/17/22 0357  AST _0 ALT _1 ALKPHOS  --  346* 297* 315*  BILITOT 0.4 0.5 0.5 0.5  PROT 7.8 8.3* 7.3 7.2  ALBUMIN  --  2.8* 2.5* 2.3*   No results for input(s): "LIPASE", "AMYLASE" in the last 168 hours. Recent Labs  Lab 03/16/22 0036 03/17/22 0357  AMMONIA 39* 27   Coagulation Profile: No results for input(s): "INR", "PROTIME" in the last 168 hours. BNP (last 3 results) No results for input(s): "PROBNP" in the last 8760 hours. HbA1C: No results for input(s): "HGBA1C" in the last 72 hours. CBG: No results for input(s): "GLUCAP" in the last 168 hours. Lipid Profile: No results for input(s): "CHOL", "HDL", "LDLCALC",  "TRIG", "CHOLHDL", "LDLDIRECT" in the last 72 hours. Thyroid Function Tests: No results for input(s): "TSH", "T4TOTAL", "FREET4", "T3FREE", "THYROIDAB" in the last 72 hours. Sepsis Labs: Recent Labs  Lab 03/15/22 1634 03/15/22 1858  LATICACIDVEN 0.9 0.6    Recent Results (from the past 240 hour(s))  Resp Panel by RT-PCR (Flu A&B, Covid) Anterior Nasal Swab     Status: None   Collection Time: 03/15/22  3:48 PM   Specimen: Anterior Nasal Swab  Result Value Ref Range Status   SARS Coronavirus 2 by RT PCR NEGATIVE NEGATIVE Final    Comment: (NOTE) SARS-CoV-2 target nucleic acids are NOT DETECTED.  The SARS-CoV-2 RNA is generally detectable in upper respiratory specimens during the acute phase of infection. The lowest concentration of SARS-CoV-2 viral copies this assay can detect is 138 copies/mL. A negative result does not preclude SARS-Cov-2 infection and should not be used as the sole basis for treatment or other patient management decisions. A negative result may occur with  improper specimen collection/handling, submission of specimen other than nasopharyngeal swab, presence of viral mutation(s) within the areas targeted by this assay, and inadequate number of viral copies(<138 copies/mL). A negative result must be combined with clinical observations, patient history, and epidemiological information. The expected result is Negative.  Fact Sheet for Patients:  EntrepreneurPulse.com.au  Fact Sheet for Healthcare Providers:  IncredibleEmployment.be  This test is no t yet approved or cleared by the [place] FDA and  has been authorized for detection and/or diagnosis of SARS-CoV-2 by FDA under an Emergency Use Authorization (EUA). This EUA will remain  in effect (meaning this test can be used) for the duration of the COVID-19 declaration under Section 564(b)(1) of the Act, 21 U.S.C.section 360bbb-3(b)(1), unless the authorization is  terminated  or revoked sooner.       Influenza A by PCR NEGATIVE NEGATIVE Final   Influenza B by PCR NEGATIVE NEGATIVE Final    Comment: (NOTE) The Xpert Xpress SARS-CoV-2/FLU/RSV plus assay  is intended as an aid in the diagnosis of influenza from Nasopharyngeal swab specimens and should not be used as a sole basis for treatment. Nasal washings and aspirates are unacceptable for Xpert Xpress SARS-CoV-2/FLU/RSV testing.  Fact Sheet for Patients: EntrepreneurPulse.com.au  Fact Sheet for Healthcare Providers: IncredibleEmployment.be  This test is not yet approved or cleared by the [place] FDA and has been authorized for detection and/or diagnosis of SARS-CoV-2 by FDA under an Emergency Use Authorization (EUA). This EUA will remain in effect (meaning this test can be used) for the duration of the COVID-19 declaration under Section 564(b)(1) of the Act, 21 U.S.C. section 360bbb-3(b)(1), unless the authorization is terminated or revoked.  Performed at [facility], [address] [place], [place] [address]   Culture, blood (routine x 2)     Status: None (Preliminary result)   Collection Time: 03/15/22  4:26 PM   Specimen: BLOOD  Result Value Ref Range Status   Specimen Description BLOOD BLOOD LEFT HAND  Final   Special Requests   Final    BOTTLES DRAWN AEROBIC AND ANAEROBIC Blood Culture adequate volume   Culture   Final    NO GROWTH 2 DAYS Performed at [facility], [address], [place], [place] [address]    Report Status PENDING  Incomplete  Culture, blood (routine x 2)     Status: None (Preliminary result)   Collection Time: 03/15/22  4:31 PM   Specimen: BLOOD  Result Value Ref Range Status   Specimen Description BLOOD BLOOD RIGHT HAND  Final   Special Requests   Final    BOTTLES DRAWN AEROBIC AND ANAEROBIC Blood Culture results may not be optimal due to an excessive volume of blood received in culture  bottles   Culture   Final    NO GROWTH 2 DAYS Performed at [facility], [address] [place], [place] [address]    Report Status PENDING  Incomplete  Urine Culture     Status: Abnormal   Collection Time: 03/15/22  6:10 PM   Specimen: In/Out Cath Urine  Result Value Ref Range Status   Specimen Description   Final    IN/OUT CATH URINE Performed at [facility], [address], [place], [place] [address]    Special Requests   Final    NONE Performed at [facility], [address], [place], [place] [address]    Culture MULTIPLE SPECIES PRESENT, SUGGEST RECOLLECTION (A)  Final   Report Status 03/17/2022 FINAL  Final  MRSA culture     Status: None (Preliminary result)   Collection Time: 03/16/22 12:00 PM   Specimen: Nasal; Body Fluid  Result Value Ref Range Status   Specimen Description   Final    NASAL SWAB Performed at [facility], [address] [place], [place] [address]    Special Requests   Final    NONE Performed at [facility], [address] [place], [place] [address]    Culture   Final    NO GROWTH < 12 HOURS Performed at [facility], [place] [address] [place],  [address]    Report Status PENDING  Incomplete  MRSA Next Gen by PCR, Nasal     Status: None   Collection Time: 03/16/22 12:37 PM   Specimen: Nasal Mucosa; Nasal Swab  Result Value Ref Range Status   MRSA by PCR Next Gen NOT DETECTED NOT DETECTED Final    Comment: (NOTE) The GeneXpert MRSA Assay (  FDA approved for NASAL specimens only), is one component of a comprehensive MRSA colonization surveillance program. It is not intended to diagnose MRSA infection nor to guide or monitor treatment for MRSA infections. Test performance is not FDA approved in patients less than 26 years old. Performed at [facility], [address] [place], [place] [address]     Antimicrobials: Anti-infectives (From admission, onward)    Start      Dose/Rate Route Frequency Ordered Stop   03/17/22 1000  fluconazole (DIFLUCAN) tablet 100 mg  Status:  Discontinued        100 mg Oral Daily 03/16/22 1131 03/17/22 0833   03/17/22 1000  fluconazole (DIFLUCAN) tablet 200 mg  Status:  Discontinued        200 mg Oral Daily 03/17/22 0833 03/17/22 1020   03/16/22 1730  azithromycin (ZITHROMAX) 500 mg in sodium chloride 0.9 % 250 mL IVPB  Status:  Discontinued        500 mg 250 mL/hr over 60 Minutes Intravenous Every 24 hours 03/15/22 2328 03/16/22 1506   03/16/22 0330  fluconazole (DIFLUCAN) IVPB 200 mg  Status:  Discontinued        200 mg 100 mL/hr over 60 Minutes Intravenous Every 24 hours 03/16/22 0230 03/16/22 1131   03/16/22 0300  vancomycin (VANCOREADY) IVPB 1250 mg/250 mL  Status:  Discontinued        1,250 mg 166.7 mL/hr over 90 Minutes Intravenous Every 8 hours 03/16/22 0212 03/17/22 1020   03/16/22 0130  bictegravir-emtricitabine-tenofovir AF (BIKTARVY) 50-200-25 MG per tablet 1 tablet        1 tablet Oral Daily 03/15/22 2328     03/16/22 0045  piperacillin-tazobactam (ZOSYN) IVPB 3.375 g        3.375 g 12.5 mL/hr over 240 Minutes Intravenous Every 8 hours 03/15/22 2328     03/15/22 1730  azithromycin (ZITHROMAX) 500 mg in sodium chloride 0.9 % 250 mL IVPB        500 mg 250 mL/hr over 60 Minutes Intravenous  Once 03/15/22 1715 03/15/22 1852   03/15/22 1700  vancomycin (VANCOREADY) IVPB 2000 mg/400 mL        2,000 mg 200 mL/hr over 120 Minutes Intravenous  Once 03/15/22 1652 03/15/22 1940   03/15/22 1645  piperacillin-tazobactam (ZOSYN) IVPB 3.375 g        3.375 g 100 mL/hr over 30 Minutes Intravenous  Once 03/15/22 1632 03/15/22 1727      Culture/Microbiology    Component Value Date/Time   SDES  03/16/2022 1200    NASAL SWAB Performed at [facility], [address] [place], [place] [address]    [facility]  03/16/2022 1200    NONE Performed at [facility], [address] [place], [place]  [address]    [place]  03/16/2022 1200    NO GROWTH < 12 HOURS Performed at [place] [address] [place], [place] [address]    REPTSTATUS PENDING 03/16/2022 1200    Other culture-see note  Radiology Studies: MR THORACIC SPINE W WO CONTRAST  Result Date: 03/16/2022 CLINICAL DATA:  Immunocompromised with back pain and fever. EXAM: MRI CERVICAL, THORACIC AND LUMBAR SPINE WITHOUT AND WITH CONTRAST TECHNIQUE: Multiplanar and multiecho pulse sequences of the cervical spine, to include the craniocervical junction and cervicothoracic junction, and thoracic and lumbar spine, were obtained without and with intravenous contrast. CONTRAST:  45m GADAVIST GADOBUTROL 1 MMOL/ML IV SOLN COMPARISON:  Neck CT 03/15/2022 and prior MR examinations 09/29/2021. FINDINGS:  MRI CERVICAL SPINE FINDINGS Limited examination due to patient motion. Alignment: Normal Vertebrae: Significant artifact associated with the anterior and posterior cervical fusion hardware. No acute bony findings or evidence of discitis or osteomyelitis. Cord: Grossly cord signal intensity. No cord lesions or syrinx. Posterior Fossa, vertebral arteries, paraspinal tissues: As demonstrated on the neck CT there is massive left-sided cervical lymphadenopathy. Disc levels: No cervical disc protrusions spinal or foraminal stenosis. Moderate thinning of the cervical cord at the level of the prior surgery which is stable. MRI THORACIC SPINE FINDINGS Alignment:  He normal Vertebrae: Normal marrow signal. No bone lesions or fractures. No findings for discitis or osteomyelitis. Cord: Normal cord signal intensity. No cord lesions, syrinx or abnormal cord enhancement. Paraspinal and other soft tissues: No significant findings. Disc levels: Small left paracentral disc protrusion at T5-6. Small central disc protrusion at T8-9. Right paracentral disc protrusion at T9-10. MRI LUMBAR SPINE FINDINGS Segmentation: There are five lumbar type vertebral bodies. The last  full intervertebral disc space is labeled L5-S1. Alignment:  Normal Vertebrae: Normal marrow signal. No bone lesions or fractures. No findings suspicious for discitis or osteomyelitis. Conus medullaris and cauda equina: Conus extends to the L1 level. Conus and cauda equina appear normal. Paraspinal and other soft tissues: No significant paraspinal or retroperitoneal findings. Disc levels: L1-2: No significant findings. L2-3: Disc desiccation and disc space narrowing with a small to moderate-sized disc extrusion with mass effect on the ventral thecal sac. Some disc material is coursing up behind L2 and down behind L3. L3-4: No significant findings. L4-5: Mild annular bulge but no spinal or foraminal stenosis. L5-S1 no significant findings. IMPRESSION: 1. As demonstrated on the neck CT examination there is massive left-sided cervical lymphadenopathy. 2. No findings for discitis or osteomyelitis in the cervical, thoracic or lumbar spines. 3. Small thoracic disc protrusions but no significant spinal or foraminal stenosis. 4. Small to moderate-sized disc extrusion at L2-3 with mass effect on the ventral thecal sac. Electronically Signed   By: Marijo Sanes M.D.   On: 03/16/2022 14:53   MR Lumbar Spine W Wo Contrast  Result Date: 03/16/2022 CLINICAL DATA:  Immunocompromised with back pain and fever. EXAM: MRI CERVICAL, THORACIC AND LUMBAR SPINE WITHOUT AND WITH CONTRAST TECHNIQUE: Multiplanar and multiecho pulse sequences of the cervical spine, to include the craniocervical junction and cervicothoracic junction, and thoracic and lumbar spine, were obtained without and with intravenous contrast. CONTRAST:  6m GADAVIST GADOBUTROL 1 MMOL/ML IV SOLN COMPARISON:  Neck CT 03/15/2022 and prior MR examinations 09/29/2021. FINDINGS: MRI CERVICAL SPINE FINDINGS Limited examination due to patient motion. Alignment: Normal Vertebrae: Significant artifact associated with the anterior and posterior cervical fusion hardware. No  acute bony findings or evidence of discitis or osteomyelitis. Cord: Grossly cord signal intensity. No cord lesions or syrinx. Posterior Fossa, vertebral arteries, paraspinal tissues: As demonstrated on the neck CT there is massive left-sided cervical lymphadenopathy. Disc levels: No cervical disc protrusions spinal or foraminal stenosis. Moderate thinning of the cervical cord at the level of the prior surgery which is stable. MRI THORACIC SPINE FINDINGS Alignment:  He normal Vertebrae: Normal marrow signal. No bone lesions or fractures. No findings for discitis or osteomyelitis. Cord: Normal cord signal intensity. No cord lesions, syrinx or abnormal cord enhancement. Paraspinal and other soft tissues: No significant findings. Disc levels: Small left paracentral disc protrusion at T5-6. Small central disc protrusion at T8-9. Right paracentral disc protrusion at T9-10. MRI LUMBAR SPINE FINDINGS Segmentation: There are five lumbar type vertebral bodies. The  last full intervertebral disc space is labeled L5-S1. Alignment:  Normal Vertebrae: Normal marrow signal. No bone lesions or fractures. No findings suspicious for discitis or osteomyelitis. Conus medullaris and cauda equina: Conus extends to the L1 level. Conus and cauda equina appear normal. Paraspinal and other soft tissues: No significant paraspinal or retroperitoneal findings. Disc levels: L1-2: No significant findings. L2-3: Disc desiccation and disc space narrowing with a small to moderate-sized disc extrusion with mass effect on the ventral thecal sac. Some disc material is coursing up behind L2 and down behind L3. L3-4: No significant findings. L4-5: Mild annular bulge but no spinal or foraminal stenosis. L5-S1 no significant findings. IMPRESSION: 1. As demonstrated on the neck CT examination there is massive left-sided cervical lymphadenopathy. 2. No findings for discitis or osteomyelitis in the cervical, thoracic or lumbar spines. 3. Small thoracic disc  protrusions but no significant spinal or foraminal stenosis. 4. Small to moderate-sized disc extrusion at L2-3 with mass effect on the ventral thecal sac. Electronically Signed   By: Marijo Sanes M.D.   On: 03/16/2022 14:53   MR CERVICAL SPINE W WO CONTRAST  Result Date: 03/16/2022 CLINICAL DATA:  Immunocompromised with back pain and fever. EXAM: MRI CERVICAL, THORACIC AND LUMBAR SPINE WITHOUT AND WITH CONTRAST TECHNIQUE: Multiplanar and multiecho pulse sequences of the cervical spine, to include the craniocervical junction and cervicothoracic junction, and thoracic and lumbar spine, were obtained without and with intravenous contrast. CONTRAST:  51m GADAVIST GADOBUTROL 1 MMOL/ML IV SOLN COMPARISON:  Neck CT 03/15/2022 and prior MR examinations 09/29/2021. FINDINGS: MRI CERVICAL SPINE FINDINGS Limited examination due to patient motion. Alignment: Normal Vertebrae: Significant artifact associated with the anterior and posterior cervical fusion hardware. No acute bony findings or evidence of discitis or osteomyelitis. Cord: Grossly cord signal intensity. No cord lesions or syrinx. Posterior Fossa, vertebral arteries, paraspinal tissues: As demonstrated on the neck CT there is massive left-sided cervical lymphadenopathy. Disc levels: No cervical disc protrusions spinal or foraminal stenosis. Moderate thinning of the cervical cord at the level of the prior surgery which is stable. MRI THORACIC SPINE FINDINGS Alignment:  He normal Vertebrae: Normal marrow signal. No bone lesions or fractures. No findings for discitis or osteomyelitis. Cord: Normal cord signal intensity. No cord lesions, syrinx or abnormal cord enhancement. Paraspinal and other soft tissues: No significant findings. Disc levels: Small left paracentral disc protrusion at T5-6. Small central disc protrusion at T8-9. Right paracentral disc protrusion at T9-10. MRI LUMBAR SPINE FINDINGS Segmentation: There are five lumbar type vertebral bodies. The last  full intervertebral disc space is labeled L5-S1. Alignment:  Normal Vertebrae: Normal marrow signal. No bone lesions or fractures. No findings suspicious for discitis or osteomyelitis. Conus medullaris and cauda equina: Conus extends to the L1 level. Conus and cauda equina appear normal. Paraspinal and other soft tissues: No significant paraspinal or retroperitoneal findings. Disc levels: L1-2: No significant findings. L2-3: Disc desiccation and disc space narrowing with a small to moderate-sized disc extrusion with mass effect on the ventral thecal sac. Some disc material is coursing up behind L2 and down behind L3. L3-4: No significant findings. L4-5: Mild annular bulge but no spinal or foraminal stenosis. L5-S1 no significant findings. IMPRESSION: 1. As demonstrated on the neck CT examination there is massive left-sided cervical lymphadenopathy. 2. No findings for discitis or osteomyelitis in the cervical, thoracic or lumbar spines. 3. Small thoracic disc protrusions but no significant spinal or foraminal stenosis. 4. Small to moderate-sized disc extrusion at L2-3 with mass effect on  the ventral thecal sac. Electronically Signed   By: Marijo Sanes M.D.   On: 03/16/2022 14:53   CT HEAD WO CONTRAST (5MM)  Result Date: 03/15/2022 CLINICAL DATA:  Altered mental status. EXAM: CT HEAD WITHOUT CONTRAST TECHNIQUE: Contiguous axial images were obtained from the base of the skull through the vertex without intravenous contrast. RADIATION DOSE REDUCTION: This exam was performed according to the departmental dose-optimization program which includes automated exposure control, adjustment of the mA and/or kV according to patient size and/or use of iterative reconstruction technique. COMPARISON:  Brain MRI dated 09/29/2021. FINDINGS: Brain: The ventricles and sulci are appropriate size for the patient's age. The gray-white matter discrimination is preserved. There is no acute intracranial hemorrhage. No mass effect or  midline shift. No extra-axial fluid collection. Vascular: No hyperdense vessel or unexpected calcification. Skull: Normal. Negative for fracture or focal lesion. Sinuses/Orbits: Diffuse mucoperiosteal thickening of paranasal sinuses with opacification of several ethmoid air cells as well as opacification of the left frontal and sphenoid sinuses. There is left mastoid effusions. Other: None IMPRESSION: 1. No acute intracranial pathology. 2. Paranasal sinus disease. Left mastoid effusions. Electronically Signed   By: Anner Crete M.D.   On: 03/15/2022 20:21   CT Soft Tissue Neck W Contrast  Addendum Date: 03/15/2022   ADDENDUM REPORT: 03/15/2022 20:08 ADDENDUM: There is a dictation error within the final line of the impression, which should read Emphysema (ICD10-J43.9) rather than Aortic Atherosclerosis (ICD10-I70.0). Electronically Signed   By: Kellie Simmering D.O.   On: 03/15/2022 20:08   Result Date: 03/15/2022 CLINICAL DATA:  Provided history: Soft tissue infection suspected, neck, x-ray done. EXAM: CT NECK WITH CONTRAST TECHNIQUE: Multidetector CT imaging of the neck was performed using the standard protocol following the bolus administration of intravenous contrast. RADIATION DOSE REDUCTION: This exam was performed according to the departmental dose-optimization program which includes automated exposure control, adjustment of the mA and/or kV according to patient size and/or use of iterative reconstruction technique. CONTRAST:  70m OMNIPAQUE IOHEXOL 300 MG/ML  SOLN COMPARISON:  None. FINDINGS: Pharynx and larynx: Streak and beam hardening artifact arising from dental restoration partially obscures the oral cavity. Within this limitation, there is no appreciable swelling or mass within the oral cavity, pharynx or larynx. No retropharyngeal collection. Salivary glands: The left submandibular gland appears anteriorly displaced by extensive conglomerate lymphadenopathy. The bilateral parotid and  submandibular glands are otherwise unremarkable. Thyroid: Unremarkable. Lymph nodes: There is extensive lymphadenopathy throughout the left neck, much of which is conglomerate lymphadenopathy. Notably, an enlarged lymph node within the left lower neck has internal hypodensity, which may reflect suppuration or cystic/necrotic change (for instance as seen on series 6, image 74). A fat plane is poorly delineated between portions of this conglomerate lymphadenopathy and the left sternocleidomastoid muscle. A dominant conglomerate nodal mass measures 10.0 x 5.8 cm (for instance as seen on series 5, image 129). Vascular: The caudal left internal jugular vein is effaced by lymphadenopathy. The major vascular structures of the neck otherwise appear patent. Limited intracranial: No evidence of acute intracranial abnormality within the field of view. Visualized orbits: No orbital mass or acute orbital finding. Mastoids and visualized paranasal sinuses: Extensive partial opacification of the left frontal sinus due to the presence of mucosal thickening and fluid. Partial opacification of the left greater than right ethmoid air cells due to the presence of mucosal thickening and fluid. Near complete opacification of the left sphenoid sinus to the presence of mucosal thickening and fluid. Moderate to severe partial  opacification of the bilateral maxillary sinuses due to the presence of mucosal thickening and fluid. Left mastoid effusion. Skeleton: ACDF hardware spanning the C4-C7 levels. Corpectomy cages are present at C5 and C6. A posterior spinal fusion construct spans the C3-T1 levels. Prior posterior decompression at the C3-C4 through C7-T1 levels. No acute fracture or aggressive osseous lesion. Upper chest: Centrilobular and paraseptal emphysema within the imaged lung apices. These results were called by telephone at the time of interpretation on 03/15/2022 at 5:36 pm to provider [facility] , who verbally acknowledged  these results. IMPRESSION: Extensive lymphadenopathy throughout the left neck, as described. This lymphadenopathy may be due to an infectious etiology. Alternatively, this could reflect sequelae of lymphoma or nodal metastatic disease. Correlate clinically and consider direct tissue sampling. Lymphadenopathy effaces the caudal left internal jugular vein. Pansinusitis. Large left mastoid effusion. Postsurgical changes to the cervical spine, as described. Aortic Atherosclerosis (ICD10-I70.0). Electronically Signed: By: Kellie Simmering D.O. On: 03/15/2022 17:38   DG Chest 2 View  Result Date: 03/15/2022 CLINICAL DATA:  Fever. Recent pneumonia. Neck pain after neck surgery in 2021. EXAM: CHEST - 2 VIEW COMPARISON:  03/01/2022 FINDINGS: Normal heart size and pulmonary vascularity. No focal airspace disease or consolidation in the lungs. No blunting of costophrenic angles. No pneumothorax. Mediastinal contours appear intact. Postoperative changes in the lower cervical spine. IMPRESSION: No active cardiopulmonary disease. Electronically Signed   By: Lucienne Capers M.D.   On: 03/15/2022 17:28     LOS: 2 days   Antonieta Pert, MD [facility]  03/17/2022, 11:34 AM

## 2022-03-17 NOTE — Progress Notes (Signed)
ID Pt says he is feeling better No fever But still has the mass on the left side of the neck Some diarrhea  O/e awake and alert Patient Vitals for the past 24 hrs:  BP Temp Pulse Resp SpO2  03/17/22 1811 -- -- -- -- 100 %  03/17/22 1637 107/71 98.3 F (36.8 C) 90 16 100 %  03/17/22 0721 (!) 96/56 98.3 F (36.8 C) 87 16 100 %  03/17/22 0616 (!) 98/58 99.1 F (37.3 C) 96 18 100 %  03/16/22 2053 (!) 112/57 98.7 F (37.1 C) 78 18 97 %   Chest b/l air entry [id number] Abd soft CNS non focal Neck- left side mass of 7 cm  Labs    Latest Ref Rng & Units 03/17/2022    7:55 AM 03/17/2022    3:57 AM 03/16/2022   12:36 AM  CBC  WBC 4.0 - 10.5 K/uL  12.8  13.7   Hemoglobin 13.0 - 17.0 g/dL 8.2  6.6  8.5   Hematocrit 39.0 - 52.0 % 25.8  20.6  27.6   Platelets 150 - 400 K/uL  429  396        Latest Ref Rng & Units 03/17/2022    3:57 AM 03/16/2022    8:44 AM 03/16/2022   12:36 AM  CMP  Glucose 70 - 99 mg/dL 110  92    BUN 6 - 20 mg/dL 10  12    Creatinine 0.61 - 1.24 mg/dL 0.89  0.95  0.92   Sodium 135 - 145 mmol/L 135  130    Potassium 3.5 - 5.1 mmol/L 3.6  4.4    Chloride 98 - 111 mmol/L 107  103    CO2 22 - 32 mmol/L 21  21    Calcium 8.9 - 10.3 mg/dL 8.5  8.1    Total Protein 6.5 - 8.1 g/dL 7.2  7.3    Total Bilirubin 0.3 - 1.2 mg/dL 0.5  0.5    Alkaline Phos 38 - 126 U/L 315  297    AST 15 - 41 U/L 27  26    ALT 0 - 44 U/L 20  21       Impression/recommendation   AIDS- has been non compliant until 2 weeks ago- now on Biktarvy Last VL from 03/13/22 is 3120 Cd4 from 11/20/.23 is 524 ( not sure whether it is accurate) on 03/02/22 it was 85- will repeat  Left side neck mass which is painful- lymphhadenoathy In a patient with AIDS the D.D is long ? Mycobacterium avium ( with anemia/high Alk po4- could be a paradoxical infection due to HAART) send blood culture for [employer] ?Acute bacteria infection Fungal ? TB ?? Lymphoma  Fever and leucocytosis on presentation with pain  ful lymphadenoathy and CT showing necrotic LN makes acute infection likely Pansinuitis and left mastoid fluid On zosyn- leucocytosis improving  Send 3 sputums for [employer] Will need needle biopsy of the mass/LN to be sent for [employer], fungal culture, pathology on monday   Anemia  Resolvec candidal esophagitis  Polysubstance use  HEPB infection - surfcae antigen, antibody both positive and DNA low level- could be an acute infection which is possibly clearing Will need to follow DNA  Treated syphilis   serofast 1:2- recent L[facility] on 02/14/22 at [facility]- neg- VDRL neg as well   H/o cervical spine surgery- MRI done yesterday does not show any infection/discitis /  Discussed the management with patient and care team ID will follow him peripherally this  weekend- call if needed

## 2022-03-17 NOTE — Care Management Important Message (Signed)
Important Message  Patient Details  Name: Michael Chandler MRN: [id number] Date of Birth: 11/30/1985   Medicare Important Message Given:  N/A - LOS <3 / Initial given by admissions     [name] 03/17/2022, 3:56 PM

## 2022-03-17 NOTE — Plan of Care (Signed)
  Problem: Education: Goal: Knowledge of General Education information will improve Description: Including pain rating scale, medication(s)/side effects and non-pharmacologic comfort measures Outcome: Progressing   Problem: Clinical Measurements: Goal: Ability to maintain clinical measurements within normal limits will improve Outcome: Progressing   Problem: Clinical Measurements: Goal: Diagnostic test results will improve Outcome: Progressing   Problem: Clinical Measurements: Goal: Will remain free from infection Outcome: Progressing   Problem: Nutrition: Goal: Adequate nutrition will be maintained Outcome: Progressing   Problem: Activity: Goal: Risk for activity intolerance will decrease Outcome: Progressing   Problem: Coping: Goal: Level of anxiety will decrease Outcome: Progressing   Problem: Pain Managment: Goal: General experience of comfort will improve Outcome: Progressing   Problem: Safety: Goal: Ability to remain free from injury will improve Outcome: Progressing   Problem: Skin Integrity: Goal: Risk for impaired skin integrity will decrease Outcome: Progressing

## 2022-03-18 DIAGNOSIS — A419 Sepsis, unspecified organism: Secondary | ICD-10-CM | POA: Diagnosis not present

## 2022-03-18 DIAGNOSIS — A539 Syphilis, unspecified: Secondary | ICD-10-CM | POA: Diagnosis not present

## 2022-03-18 DIAGNOSIS — R5081 Fever presenting with conditions classified elsewhere: Secondary | ICD-10-CM | POA: Diagnosis not present

## 2022-03-18 DIAGNOSIS — B3781 Candidal esophagitis: Secondary | ICD-10-CM | POA: Diagnosis not present

## 2022-03-18 DIAGNOSIS — R591 Generalized enlarged lymph nodes: Secondary | ICD-10-CM | POA: Diagnosis not present

## 2022-03-18 DIAGNOSIS — B20 Human immunodeficiency virus [HIV] disease: Secondary | ICD-10-CM | POA: Diagnosis not present

## 2022-03-18 DIAGNOSIS — F332 Major depressive disorder, recurrent severe without psychotic features: Secondary | ICD-10-CM | POA: Diagnosis not present

## 2022-03-18 LAB — BASIC METABOLIC PANEL
Anion gap: 6 (ref 5–15)
BUN: 10 mg/dL (ref 6–20)
CO2: 20 mmol/L — ABNORMAL LOW (ref 22–32)
Calcium: 8.6 mg/dL — ABNORMAL LOW (ref 8.9–10.3)
Chloride: 111 mmol/L (ref 98–111)
Creatinine, Ser: 0.88 mg/dL (ref 0.61–1.24)
GFR, Estimated: >60 mL/min (ref >60)
Glucose, Bld: 98 mg/dL (ref 70–99)
Potassium: 4 mmol/L (ref 3.5–5.1)
Sodium: 137 mmol/L (ref 135–145)

## 2022-03-18 LAB — IRON AND TIBC
Iron: 69 ug/dL (ref 45–182)
Saturation Ratios: 32 % (ref 17.9–39.5)
TIBC: 213 ug/dL — ABNORMAL LOW (ref 250–450)
UIBC: 144 ug/dL

## 2022-03-18 LAB — CBC
HCT: 24.6 % — ABNORMAL LOW (ref 39.0–52.0)
Hemoglobin: 7.9 g/dL — ABNORMAL LOW (ref 13.0–17.0)
MCH: 25.6 pg — ABNORMAL LOW (ref 26.0–34.0)
MCHC: 32.1 g/dL (ref 30.0–36.0)
MCV: 79.9 fL — ABNORMAL LOW (ref 80.0–100.0)
Platelets: 454 10*3/uL — ABNORMAL HIGH (ref 150–400)
RBC: 3.08 MIL/uL — ABNORMAL LOW (ref 4.22–5.81)
RDW: 16.6 % — ABNORMAL HIGH (ref 11.5–15.5)
WBC: 6.1 10*3/uL (ref 4.0–10.5)
nRBC: 0 % (ref 0.0–0.2)

## 2022-03-18 LAB — RETICULOCYTES
Immature Retic Fract: 24.3 % — ABNORMAL HIGH (ref 2.3–15.9)
RBC.: 3.09 MIL/uL — ABNORMAL LOW (ref 4.22–5.81)
Retic Count, Absolute: 36.2 10*3/uL (ref 19.0–186.0)
Retic Ct Pct: 1.2 % (ref 0.4–3.1)

## 2022-03-18 LAB — VITAMIN B12: Vitamin B-12: 236 pg/mL (ref 180–914)

## 2022-03-18 LAB — FOLATE: Folate: 9 ng/mL (ref >5.9)

## 2022-03-18 LAB — FERRITIN: Ferritin: 1106 ng/mL — ABNORMAL HIGH (ref 24–336)

## 2022-03-18 NOTE — Progress Notes (Signed)
PROGRESS NOTE    Michael Chandler  ZOX:[id number] DOB: 1985/05/07 DOA: 03/15/2022 PCP: Georgann Housekeeper, MD   Brief Narrative:  36 year old male history of HIV with last CD4 count of 80, depression,schizophrenia, recent history of cervical laminectomy presented here with complaint of neck pain, cough, dizziness, increased swelling of the neck.  On presentation, he was lethargic, somnolent, febrile.  Lab work showed leukocytosis, hemoglobin of 9.6.  ENT was consulted for left neck pain.  Found to have tenderness on the left neck with lymphadenopathy.  CT neck showed moderate lymphadenopathy with no cellulitis or abscess, also showed pansinus opacification worse on the left..  Patient was admitted for the management of sepsis secondary to neck lymphadenitis, sinusitis.  ID also consulted     Assessment & Plan:   Sepsis: with soft blood pressure, fever, tachycardia and leukocytosis in the setting of immunocompromise status HIV, now on Zosyn as per ID.  Remained afebrile overnight.  Leukocytosis resolved.  Blood culture 11/22 NGTD, urine culture multiple species.  Chest x-ray negative for infection.   Left neck mass: Painful lymphadenopathy.  Unclear etiology .given hiv-R/O TB with a sputum AFB x3, continue airborne precaution until then. ID following closely and appreciate input.  QuantiFERON-TB gold: Pending.  EBV, MCV, Bartonella antibody: All pending. -As per ID he needs needle biopsy of the mass/lymph node to be sent for AFB, fungal culture, pathology on Monday.  Normocytic anemia suspect from patient's HIV repeat H&H: 7.9.  Continue to monitor closely.  Transfuse if less than 7. -Ferritin elevated likely in the setting of underlying infection -Normal iron and low TIBC.  Thrombocytosis: Chronic.  Continue to monitor  AIDS-noncompliant for many years recently started [place].  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  VDRL: Negative  Mildly elevated ammonia unclear etiology.  Continue lactulose -Resolved now.  History of MDD hospitalized in November for depression and suicidal ideation currently moderate stable.  Continue Remeron, trazodone and Xanax as needed.  History of cervical spine surgery: Reviewed MRI.  Negative for any infection or discitis.  DDD lumbar: -MRI shows small thoracic disc protrusion with no significant spinal or foraminal stenosis.  Small to moderate-sized disc extrusion at L2-L3 with mass effect on the ventral thecal sac.  He denies any pain.  Continue Tylenol as needed.  DVT prophylaxis: Lovenox Code Status: Full code Family Communication:  None present at bedside.  Plan of care discussed with patient in length and he verbalized understanding and agreed with it. Disposition Plan: To be determined.  Pending workup  Consultants:  ID  Procedures:  None  Antimicrobials:  Zosyn  Status is: Inpatient    Subjective: Patient seen and examined.  Sitting comfortably on the bed.  Complaining of pain on left side of neck mass.  Remained afebrile.  He is able to eat without any issues.  He denies any fever, chills, nausea or vomiting.  Objective: Vitals:   03/17/22 2127 03/18/22 0006 03/18/22 0512 03/18/22 0743  BP: 105/60 106/68 102/66 114/68  Pulse: 100 90 92 97  Resp: Temp: (!) 97.5 F (36.4 C) 97.7 F (36.5 C) 97.8 F (36.6 C) 98.6 F (37 C)  TempSrc: Oral  Oral   SpO2: 100% 100% 100% 100%  Weight:  Height:        Intake/Output Summary (Last 24 hours) at 03/18/2022 1235 Last data filed at 03/18/2022 0751 Gross per 24 hour  Intake --  Output 2100 ml  Net -2100 ml   Filed Weights   03/15/22 1543  Weight: 88.4 kg    Examination:  General exam:  Appears calm and comfortable, on room air, communicating well, appears older than stated age.  Appears weak and sick HEENT: Oral mucosa moist.  Tender healed swelling on left side of neck.  Not erythematous.  No warmth to touch. Respiratory system: Clear to auscultation. Respiratory effort normal. Cardiovascular system: S1 & S2 heard, RRR. No JVD, murmurs, rubs, gallops or clicks. No pedal edema. Gastrointestinal system: Abdomen is nondistended, soft and nontender. No organomegaly or masses felt. Normal bowel sounds heard. Central nervous system: Alert and oriented. No focal neurological deficits. Extremities: Symmetric 5 x 5 power. Skin: No rashes, lesions or ulcers Psychiatry: Judgement and insight appear normal. Mood & affect appropriate.    Data Reviewed: I have personally reviewed following labs and imaging studies  CBC: Recent Labs  Lab 03/13/22 1108 03/15/22 1548 03/16/22 0036 03/17/22 0357 03/17/22 0755 03/18/22 0648  WBC 22.0* 19.9* 13.7* 12.8*  --  6.1  NEUTROABS 13,882* 13.2*  --   --   --   --   HGB 9.6* 8.8* 8.5* 6.6* 8.2* 7.9*  HCT 29.3* 27.8* 27.6* 20.6* 25.8* 24.6*  MCV 81.2 80.8 82.6 80.5  --  79.9*  PLT 562* 455* 396 429*  --  454*   Basic Metabolic Panel: Recent Labs  Lab 03/13/22 1108 03/15/22 1548 03/16/22 0036 03/16/22 0844 03/17/22 0357 03/18/22 0648  NA 133* 132*  --  130* 135 137  K 4.1 4.7  --  4.4 3.6 4.0  CL 96* 102  --  103 107 111  CO2 27 22  --  21* 21* 20*  GLUCOSE 85 99  --  92 110* 98  BUN 13 18  --  12 10 10   CREATININE 1.00 0.93 0.92 0.95 0.89 0.88  CALCIUM 9.0 8.8*  --  8.1* 8.5* 8.6*   GFR: Estimated Creatinine Clearance: 147.9 mL/min (by C-G formula based on SCr of 0.88 mg/dL). Liver Function Tests: Recent Labs  Lab 03/13/22 1108 03/15/22 1548 03/16/22 0844 03/17/22 0357  AST 29 29 26 27   ALT 28 26 21 20   ALKPHOS  --  346* 297* 315*  BILITOT 0.4 0.5 0.5 0.5  PROT 7.8 8.3* 7.3 7.2  ALBUMIN  --  2.8* 2.5* 2.3*   No  results for input(s): "LIPASE", "AMYLASE" in the last 168 hours. Recent Labs  Lab 03/16/22 0036 03/17/22 0357  AMMONIA 39* 27   Coagulation Profile: No results for input(s): "INR", "PROTIME" in the last 168 hours. Cardiac Enzymes: No results for input(s): "CKTOTAL", "CKMB", "CKMBINDEX", "TROPONINI" in the last 168 hours. BNP (last 3 results) No results for input(s): "PROBNP" in the last 8760 hours. HbA1C: No results for input(s): "HGBA1C" in the last 72 hours. CBG: No results for input(s): "GLUCAP" in the last 168 hours. Lipid Profile: No results for input(s): "CHOL", "HDL", "LDLCALC", "TRIG", "CHOLHDL", "LDLDIRECT" in the last 72 hours. Thyroid Function Tests: No results for input(s): "TSH", "T4TOTAL", "FREET4", "T3FREE", "THYROIDAB" in the last 72 hours. Anemia Panel: Recent Labs    03/18/22 0648  FOLATE 9.0  FERRITIN 1,106*  TIBC 213*  IRON 69  RETICCTPCT 1.2   Sepsis Labs: Recent Labs  Lab 03/15/22 1634 03/15/22 1858  LATICACIDVEN  0.9 0.6    Recent Results (from the past 240 hour(s))  Resp Panel by RT-PCR (Flu A&B, Covid) Anterior Nasal Swab     Status: None   Collection Time: 03/15/22  3:48 PM   Specimen: Anterior Nasal Swab  Result Value Ref Range Status   SARS Coronavirus 2 by RT PCR NEGATIVE NEGATIVE Final    Comment: (NOTE) SARS-CoV-2 target nucleic acids are NOT DETECTED.  The SARS-CoV-2 RNA is generally detectable in upper respiratory specimens during the acute phase of infection. The lowest concentration of SARS-CoV-2 viral copies this assay can detect is 138 copies/mL. A negative result does not preclude SARS-Cov-2 infection and should not be used as the sole basis for treatment or other patient management decisions. A negative result may occur with  improper specimen collection/handling, submission of specimen other than nasopharyngeal swab, presence of viral mutation(s) within the areas targeted by this assay, and inadequate number of  viral copies(<138 copies/mL). A negative result must be combined with clinical observations, patient history, and epidemiological information. The expected result is Negative.  Fact Sheet for Patients:  BloggerCourse.com  Fact Sheet for Healthcare Providers:  SeriousBroker.it  This test is no t yet approved or cleared by the [place] FDA and  has been authorized for detection and/or diagnosis of SARS-CoV-2 by FDA under an Emergency Use Authorization (EUA). This EUA will remain  in effect (meaning this test can be used) for the duration of the COVID-19 declaration under Section 564(b)(1) of the Act, 21 U.S.C.section 360bbb-3(b)(1), unless the authorization is terminated  or revoked sooner.       Influenza A by PCR NEGATIVE NEGATIVE Final   Influenza B by PCR NEGATIVE NEGATIVE Final    Comment: (NOTE) The Xpert Xpress SARS-CoV-2/FLU/RSV plus assay is intended as an aid in the diagnosis of influenza from Nasopharyngeal swab specimens and should not be used as a sole basis for treatment. Nasal washings and aspirates are unacceptable for Xpert Xpress SARS-CoV-2/FLU/RSV testing.  Fact Sheet for Patients: BloggerCourse.com  Fact Sheet for Healthcare Providers: SeriousBroker.it  This test is not yet approved or cleared by the [place] FDA and has been authorized for detection and/or diagnosis of SARS-CoV-2 by FDA under an Emergency Use Authorization (EUA). This EUA will remain in effect (meaning this test can be used) for the duration of the COVID-19 declaration under Section 564(b)(1) of the Act, 21 U.S.C. section 360bbb-3(b)(1), unless the authorization is terminated or revoked.  Performed at [facility], [address] [place], [place] [address]   Culture, blood (routine x 2)     Status: None (Preliminary result)   Collection Time: 03/15/22  4:26 PM    Specimen: BLOOD  Result Value Ref Range Status   Specimen Description BLOOD BLOOD LEFT HAND  Final   Special Requests   Final    BOTTLES DRAWN AEROBIC AND ANAEROBIC Blood Culture adequate volume   Culture   Final    NO GROWTH 3 DAYS Performed at [facility], [address], [place], [place] [address]    Report Status PENDING  Incomplete  Culture, blood (routine x 2)     Status: None (Preliminary result)   Collection Time: 03/15/22  4:31 PM   Specimen: BLOOD  Result Value Ref Range Status   Specimen Description BLOOD BLOOD RIGHT HAND  Final   Special Requests   Final    BOTTLES DRAWN AEROBIC AND ANAEROBIC Blood Culture results may not be optimal due to an excessive volume of blood received  in culture bottles   Culture   Final    NO GROWTH 3 DAYS Performed at [facility], [address] [place], [place] [address]    Report Status PENDING  Incomplete  Urine Culture     Status: Abnormal   Collection Time: 03/15/22  6:10 PM   Specimen: In/Out Cath Urine  Result Value Ref Range Status   Specimen Description   Final    IN/OUT CATH URINE Performed at [facility], [address] [place], [place] [address]    Special Requests   Final    NONE Performed at [facility], [address] [place], [place] [address]    Culture MULTIPLE SPECIES PRESENT, SUGGEST RECOLLECTION (A)  Final   Report Status 03/17/2022 FINAL  Final  MRSA culture     Status: None (Preliminary result)   Collection Time: 03/16/22 12:00 PM   Specimen: Nasal; Body Fluid  Result Value Ref Range Status   Specimen Description   Final    NASAL SWAB Performed at [facility], [address] [place], [place] [address]    Special Requests   Final    NONE Performed at [facility], [address] [place], [place] [address]    Culture   Final    CULTURE REINCUBATED FOR BETTER GROWTH Performed at [facility], [address] [place], [place]  [address]    Report Status PENDING  Incomplete  MRSA Next Gen by PCR, Nasal     Status: None   Collection Time: 03/16/22 12:37 PM   Specimen: Nasal Mucosa; Nasal Swab  Result Value Ref Range Status   MRSA by PCR Next Gen NOT DETECTED NOT DETECTED Final    Comment: (NOTE) The GeneXpert MRSA Assay (FDA approved for NASAL specimens only), is one component of a comprehensive MRSA colonization surveillance program. It is not intended to diagnose MRSA infection nor to guide or monitor treatment for MRSA infections. Test performance is not FDA approved in patients less than 34 years old. Performed at [facility], [address] [place], [place] [address]       Radiology Studies: MR THORACIC SPINE W WO CONTRAST  Result Date: 03/16/2022 CLINICAL DATA:  Immunocompromised with back pain and fever. EXAM: MRI CERVICAL, THORACIC AND LUMBAR SPINE WITHOUT AND WITH CONTRAST TECHNIQUE: Multiplanar and multiecho pulse sequences of the cervical spine, to include the craniocervical junction and cervicothoracic junction, and thoracic and lumbar spine, were obtained without and with intravenous contrast. CONTRAST:  9mL GADAVIST GADOBUTROL 1 MMOL/ML IV SOLN COMPARISON:  Neck CT 03/15/2022 and prior MR examinations 09/29/2021. FINDINGS: MRI CERVICAL SPINE FINDINGS Limited examination due to patient motion. Alignment: Normal Vertebrae: Significant artifact associated with the anterior and posterior cervical fusion hardware. No acute bony findings or evidence of discitis or osteomyelitis. Cord: Grossly cord signal intensity. No cord lesions or syrinx. Posterior Fossa, vertebral arteries, paraspinal tissues: As demonstrated on the neck CT there is massive left-sided cervical lymphadenopathy. Disc levels: No cervical disc protrusions spinal or foraminal stenosis. Moderate thinning of the cervical cord at the level of the prior surgery which is stable. MRI THORACIC SPINE FINDINGS Alignment:  He normal Vertebrae:  Normal marrow signal. No bone lesions or fractures. No findings for discitis or osteomyelitis. Cord: Normal cord signal intensity. No cord lesions, syrinx or abnormal cord enhancement. Paraspinal and other soft tissues: No significant findings. Disc levels: Small left paracentral disc protrusion at T5-6. Small central disc protrusion at T8-9. Right paracentral disc  protrusion at T9-10. MRI LUMBAR SPINE FINDINGS Segmentation: There are five lumbar type vertebral bodies. The last full intervertebral disc space is labeled L5-S1. Alignment:  Normal Vertebrae: Normal marrow signal. No bone lesions or fractures. No findings suspicious for discitis or osteomyelitis. Conus medullaris and cauda equina: Conus extends to the L1 level. Conus and cauda equina appear normal. Paraspinal and other soft tissues: No significant paraspinal or retroperitoneal findings. Disc levels: L1-2: No significant findings. L2-3: Disc desiccation and disc space narrowing with a small to moderate-sized disc extrusion with mass effect on the ventral thecal sac. Some disc material is coursing up behind L2 and down behind L3. L3-4: No significant findings. L4-5: Mild annular bulge but no spinal or foraminal stenosis. L5-S1 no significant findings. IMPRESSION: 1. As demonstrated on the neck CT examination there is massive left-sided cervical lymphadenopathy. 2. No findings for discitis or osteomyelitis in the cervical, thoracic or lumbar spines. 3. Small thoracic disc protrusions but no significant spinal or foraminal stenosis. 4. Small to moderate-sized disc extrusion at L2-3 with mass effect on the ventral thecal sac. Electronically Signed   By: Rudie Meyer M.D.   On: 03/16/2022 14:53   MR Lumbar Spine W Wo Contrast  Result Date: 03/16/2022 CLINICAL DATA:  Immunocompromised with back pain and fever. EXAM: MRI CERVICAL, THORACIC AND LUMBAR SPINE WITHOUT AND WITH CONTRAST TECHNIQUE: Multiplanar and multiecho pulse sequences of the cervical  spine, to include the craniocervical junction and cervicothoracic junction, and thoracic and lumbar spine, were obtained without and with intravenous contrast. CONTRAST:  9mL GADAVIST GADOBUTROL 1 MMOL/ML IV SOLN COMPARISON:  Neck CT 03/15/2022 and prior MR examinations 09/29/2021. FINDINGS: MRI CERVICAL SPINE FINDINGS Limited examination due to patient motion. Alignment: Normal Vertebrae: Significant artifact associated with the anterior and posterior cervical fusion hardware. No acute bony findings or evidence of discitis or osteomyelitis. Cord: Grossly cord signal intensity. No cord lesions or syrinx. Posterior Fossa, vertebral arteries, paraspinal tissues: As demonstrated on the neck CT there is massive left-sided cervical lymphadenopathy. Disc levels: No cervical disc protrusions spinal or foraminal stenosis. Moderate thinning of the cervical cord at the level of the prior surgery which is stable. MRI THORACIC SPINE FINDINGS Alignment:  He normal Vertebrae: Normal marrow signal. No bone lesions or fractures. No findings for discitis or osteomyelitis. Cord: Normal cord signal intensity. No cord lesions, syrinx or abnormal cord enhancement. Paraspinal and other soft tissues: No significant findings. Disc levels: Small left paracentral disc protrusion at T5-6. Small central disc protrusion at T8-9. Right paracentral disc protrusion at T9-10. MRI LUMBAR SPINE FINDINGS Segmentation: There are five lumbar type vertebral bodies. The last full intervertebral disc space is labeled L5-S1. Alignment:  Normal Vertebrae: Normal marrow signal. No bone lesions or fractures. No findings suspicious for discitis or osteomyelitis. Conus medullaris and cauda equina: Conus extends to the L1 level. Conus and cauda equina appear normal. Paraspinal and other soft tissues: No significant paraspinal or retroperitoneal findings. Disc levels: L1-2: No significant findings. L2-3: Disc desiccation and disc space narrowing with a small to  moderate-sized disc extrusion with mass effect on the ventral thecal sac. Some disc material is coursing up behind L2 and down behind L3. L3-4: No significant findings. L4-5: Mild annular bulge but no spinal or foraminal stenosis. L5-S1 no significant findings. IMPRESSION: 1. As demonstrated on the neck CT examination there is massive left-sided cervical lymphadenopathy. 2. No findings for discitis or osteomyelitis in the cervical, thoracic or lumbar spines. 3. Small thoracic disc protrusions but no significant  spinal or foraminal stenosis. 4. Small to moderate-sized disc extrusion at L2-3 with mass effect on the ventral thecal sac. Electronically Signed   By: Rudie Meyer M.D.   On: 03/16/2022 14:53   MR CERVICAL SPINE W WO CONTRAST  Result Date: 03/16/2022 CLINICAL DATA:  Immunocompromised with back pain and fever. EXAM: MRI CERVICAL, THORACIC AND LUMBAR SPINE WITHOUT AND WITH CONTRAST TECHNIQUE: Multiplanar and multiecho pulse sequences of the cervical spine, to include the craniocervical junction and cervicothoracic junction, and thoracic and lumbar spine, were obtained without and with intravenous contrast. CONTRAST:  64mL GADAVIST GADOBUTROL 1 MMOL/ML IV SOLN COMPARISON:  Neck CT 03/15/2022 and prior MR examinations 09/29/2021. FINDINGS: MRI CERVICAL SPINE FINDINGS Limited examination due to patient motion. Alignment: Normal Vertebrae: Significant artifact associated with the anterior and posterior cervical fusion hardware. No acute bony findings or evidence of discitis or osteomyelitis. Cord: Grossly cord signal intensity. No cord lesions or syrinx. Posterior Fossa, vertebral arteries, paraspinal tissues: As demonstrated on the neck CT there is massive left-sided cervical lymphadenopathy. Disc levels: No cervical disc protrusions spinal or foraminal stenosis. Moderate thinning of the cervical cord at the level of the prior surgery which is stable. MRI THORACIC SPINE FINDINGS Alignment:  He normal  Vertebrae: Normal marrow signal. No bone lesions or fractures. No findings for discitis or osteomyelitis. Cord: Normal cord signal intensity. No cord lesions, syrinx or abnormal cord enhancement. Paraspinal and other soft tissues: No significant findings. Disc levels: Small left paracentral disc protrusion at T5-6. Small central disc protrusion at T8-9. Right paracentral disc protrusion at T9-10. MRI LUMBAR SPINE FINDINGS Segmentation: There are five lumbar type vertebral bodies. The last full intervertebral disc space is labeled L5-S1. Alignment:  Normal Vertebrae: Normal marrow signal. No bone lesions or fractures. No findings suspicious for discitis or osteomyelitis. Conus medullaris and cauda equina: Conus extends to the L1 level. Conus and cauda equina appear normal. Paraspinal and other soft tissues: No significant paraspinal or retroperitoneal findings. Disc levels: L1-2: No significant findings. L2-3: Disc desiccation and disc space narrowing with a small to moderate-sized disc extrusion with mass effect on the ventral thecal sac. Some disc material is coursing up behind L2 and down behind L3. L3-4: No significant findings. L4-5: Mild annular bulge but no spinal or foraminal stenosis. L5-S1 no significant findings. IMPRESSION: 1. As demonstrated on the neck CT examination there is massive left-sided cervical lymphadenopathy. 2. No findings for discitis or osteomyelitis in the cervical, thoracic or lumbar spines. 3. Small thoracic disc protrusions but no significant spinal or foraminal stenosis. 4. Small to moderate-sized disc extrusion at L2-3 with mass effect on the ventral thecal sac. Electronically Signed   By: Rudie Meyer M.D.   On: 03/16/2022 14:53    Scheduled Meds:  bictegravir-emtricitabine-tenofovir AF  1 tablet Oral Daily   enoxaparin (LOVENOX) injection  40 mg Subcutaneous Q24H   lactulose  10 g Oral BID   mirtazapine  15 mg Oral QHS   sodium chloride HYPERTONIC  4 mL Nebulization Daily    thiamine  100 mg Oral Daily   Continuous Infusions:  lactated ringers 50 mL/hr at 03/16/22 1558   piperacillin-tazobactam 3.375 g (03/18/22 0511)     LOS: 3 days   Time spent: 35 minutes   Aleksandar Duve Estill Cotta, MD [facility]  If 7PM-7AM, please contact night-coverage [email or web address] 03/18/2022, 12:35 PM

## 2022-03-19 DIAGNOSIS — B3781 Candidal esophagitis: Secondary | ICD-10-CM | POA: Diagnosis not present

## 2022-03-19 DIAGNOSIS — R5081 Fever presenting with conditions classified elsewhere: Secondary | ICD-10-CM | POA: Diagnosis not present

## 2022-03-19 DIAGNOSIS — B20 Human immunodeficiency virus [HIV] disease: Secondary | ICD-10-CM | POA: Diagnosis not present

## 2022-03-19 DIAGNOSIS — A539 Syphilis, unspecified: Secondary | ICD-10-CM | POA: Diagnosis not present

## 2022-03-19 LAB — BASIC METABOLIC PANEL
Anion gap: 9 (ref 5–15)
BUN: 9 mg/dL (ref 6–20)
CO2: 20 mmol/L — ABNORMAL LOW (ref 22–32)
Calcium: 8.7 mg/dL — ABNORMAL LOW (ref 8.9–10.3)
Chloride: 108 mmol/L (ref 98–111)
Creatinine, Ser: 0.98 mg/dL (ref 0.61–1.24)
GFR, Estimated: >60 mL/min (ref >60)
Glucose, Bld: 101 mg/dL — ABNORMAL HIGH (ref 70–99)
Potassium: 4.2 mmol/L (ref 3.5–5.1)
Sodium: 137 mmol/L (ref 135–145)

## 2022-03-19 LAB — CBC
HCT: 26.8 % — ABNORMAL LOW (ref 39.0–52.0)
Hemoglobin: 8.5 g/dL — ABNORMAL LOW (ref 13.0–17.0)
MCH: 25.5 pg — ABNORMAL LOW (ref 26.0–34.0)
MCHC: 31.7 g/dL (ref 30.0–36.0)
MCV: 80.5 fL (ref 80.0–100.0)
Platelets: 532 10*3/uL — ABNORMAL HIGH (ref 150–400)
RBC: 3.33 MIL/uL — ABNORMAL LOW (ref 4.22–5.81)
RDW: 17.1 % — ABNORMAL HIGH (ref 11.5–15.5)
WBC: 6.3 10*3/uL (ref 4.0–10.5)
nRBC: 0 % (ref 0.0–0.2)

## 2022-03-19 LAB — ACID FAST SMEAR (AFB, MYCOBACTERIA): Acid Fast Smear: NEGATIVE

## 2022-03-19 NOTE — Progress Notes (Signed)
PROGRESS NOTE    Michael Chandler  ZDG:[id number] DOB: 1985-07-22 DOA: 03/15/2022 PCP: Georgann Housekeeper, MD   Brief Narrative:  36 year old male history of HIV with last CD4 count of 80, depression,schizophrenia, recent history of cervical laminectomy presented here with complaint of neck pain, cough, dizziness, increased swelling of the neck.  On presentation, he was lethargic, somnolent, febrile.  Lab work showed leukocytosis, hemoglobin of 9.6.  ENT was consulted for left neck pain.  Found to have tenderness on the left neck with lymphadenopathy.  CT neck showed moderate lymphadenopathy with no cellulitis or abscess, also showed pansinus opacification worse on the left..  Patient was admitted for the management of sepsis secondary to neck lymphadenitis, sinusitis.  ID also consulted     Assessment & Plan:   Sepsis: with soft blood pressure, fever, tachycardia and leukocytosis in the setting of immunocompromise status HIV, now on Zosyn as per ID.  Remained afebrile overnight.  Leukocytosis resolved.  Blood culture 11/22 NGTD, urine culture multiple species.  Chest x-ray negative for infection. -Sepsis has resolved.   Left neck mass: Painful lymphadenopathy.  Unclear etiology .given hiv-R/O TB with a sputum AFB x3, continue airborne precaution until then. ID following closely and appreciate input.  QuantiFERON-TB gold: Pending.  EBV, CMV, Bartonella antibody: All pending. -As per ID he needs needle biopsy of the mass/lymph node to be sent for AFB, fungal culture, pathology on Monday 11/27.  Normocytic anemia suspect from patient's HIV.  H&H is now at baseline.  Continue to monitor closely.  Transfuse if less than 7. -Ferritin elevated likely in the setting of underlying infection/inflammation and -Normal iron and low TIBC.  Thrombocytosis: Chronic.  Continue to monitor  AIDS-noncompliant for many years recently started [place].  Continue management per ID  Left mastoid  effusion/pansinusitis: Continue antibiotics  Hyponatremia resolved  Recent candidal esophagitis resolved  Active hepatitis B infectio: -Surface antigen and antibody both positive and DNA low level.  Could be acute infection.  Need to follow DNA.  Management per ID.  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  VDRL: Negative  Mildly elevated ammonia unclear etiology.  Continue lactulose -Resolved now.  History of MDD hospitalized in November for depression and suicidal ideation currently moderate stable.  Continue Remeron, trazodone and Xanax as needed.  History of cervical spine surgery: Reviewed MRI.  Negative for any infection or discitis.  DDD lumbar: -MRI shows small thoracic disc protrusion with no significant spinal or foraminal stenosis.  Small to moderate-sized disc extrusion at L2-L3 with mass effect on the ventral thecal sac.  He denies any pain.  Continue Tylenol as needed.  DVT prophylaxis: Lovenox Code Status: Full code Family Communication:  None present at bedside.  Plan of care discussed with patient in length and he verbalized understanding and agreed with it. Disposition Plan: To be determined.  Pending workup  Consultants:  ID  Procedures:  None  Antimicrobials:  Zosyn  Status is: Inpatient    Subjective: Patient seen and examined.  Denies any new complaints.  Continues to have pain on left neck mass.  Remained afebrile.  No acute event overnight.  Objective: Vitals:   03/18/22 1428 03/19/22 0020 03/19/22 0733 03/19/22 0848  BP: 108/65 112/70 111/69   Pulse: (!) 110 90 91   Resp: 20 18 16    Temp: 98 F (36.7 C) 98 F (36.7 C) 98.4 F (36.9 C)   TempSrc:  Oral    SpO2: 100% 100% 100% 100%  Weight:      Height:        Intake/Output Summary (Last 24 hours) at 03/19/2022 1046 Last data filed at 03/19/2022 0816 Gross per 24 hour  Intake 360 ml  Output 2775 ml  Net -2415 ml    Filed Weights    03/15/22 1543  Weight: 88.4 kg    Examination:  General exam: Appears calm and comfortable, on room air, communicating well, appears older than stated age.  Appears weak. HEENT: Oral mucosa moist.  Tender swelling on left side of neck.  Not erythematous.  No warmth to touch. Respiratory system: Clear to auscultation. Respiratory effort normal. Cardiovascular system: S1 & S2 heard, RRR. No JVD, murmurs, rubs, gallops or clicks. No pedal edema. Gastrointestinal system: Abdomen is nondistended, soft and nontender. No organomegaly or masses felt. Normal bowel sounds heard. Central nervous system: Alert and oriented. No focal neurological deficits. Extremities: Symmetric 5 x 5 power. Skin: No rashes, lesions or ulcers Psychiatry: Judgement and insight appear normal. Mood & affect appropriate.    Data Reviewed: I have personally reviewed following labs and imaging studies  CBC: Recent Labs  Lab 03/13/22 1108 03/15/22 1548 03/16/22 0036 03/17/22 0357 03/17/22 0755 03/18/22 0648 03/19/22 0402  WBC 22.0* 19.9* 13.7* 12.8*  --  6.1 6.3  NEUTROABS 13,882* 13.2*  --   --   --   --   --   HGB 9.6* 8.8* 8.5* 6.6* 8.2* 7.9* 8.5*  HCT 29.3* 27.8* 27.6* 20.6* 25.8* 24.6* 26.8*  MCV 81.2 80.8 82.6 80.5  --  79.9* 80.5  PLT 562* 455* 396 429*  --  454* 532*    Basic Metabolic Panel: Recent Labs  Lab 03/15/22 1548 03/16/22 0036 03/16/22 0844 03/17/22 0357 03/18/22 0648 03/19/22 0402  NA 132*  --  130* 135 137 137  K 4.7  --  4.4 3.6 4.0 4.2  CL 102  --  103 107 111 108  CO2 22  --  21* 21* 20* 20*  GLUCOSE 99  --  92 110* 98 101*  BUN 18  --  12 10 10 9   CREATININE 0.93 0.92 0.95 0.89 0.88 0.98  CALCIUM 8.8*  --  8.1* 8.5* 8.6* 8.7*    GFR: Estimated Creatinine Clearance: 132.8 mL/min (by C-G formula based on SCr of 0.98 mg/dL). Liver Function Tests: Recent Labs  Lab 03/13/22 1108 03/15/22 1548 03/16/22 0844 03/17/22 0357  AST 29 29 26 27   ALT 28 26 21 20   ALKPHOS  --   346* 297* 315*  BILITOT 0.4 0.5 0.5 0.5  PROT 7.8 8.3* 7.3 7.2  ALBUMIN  --  2.8* 2.5* 2.3*    No results for input(s): "LIPASE", "AMYLASE" in the last 168 hours. Recent Labs  Lab 03/16/22 0036 03/17/22 0357  AMMONIA 39* 27    Coagulation Profile: No results for input(s): "INR", "PROTIME" in the last 168 hours. Cardiac Enzymes: No results for input(s): "CKTOTAL", "CKMB", "CKMBINDEX", "TROPONINI" in the last 168 hours. BNP (last 3 results) No results for input(s): "PROBNP" in the last 8760 hours. HbA1C: No results for input(s): "HGBA1C" in the last 72 hours. CBG: No results for input(s): "GLUCAP" in the last 168 hours. Lipid Profile: No results for input(s): "CHOL", "HDL", "LDLCALC", "TRIG", "CHOLHDL", "LDLDIRECT" in the last 72 hours. Thyroid Function Tests: No results for input(s): "TSH", "T4TOTAL", "FREET4", "T3FREE", "THYROIDAB" in the last 72 hours. Anemia Panel: Recent Labs    03/18/22 0648  VITAMINB12 236  FOLATE 9.0  FERRITIN 1,106*  TIBC 213*  IRON 69  RETICCTPCT 1.2    Sepsis Labs: Recent Labs  Lab 03/15/22 1634 03/15/22 1858  LATICACIDVEN 0.9 0.6     Recent Results (from the past 240 hour(s))  Resp Panel by RT-PCR (Flu A&B, Covid) Anterior Nasal Swab     Status: None   Collection Time: 03/15/22  3:48 PM   Specimen: Anterior Nasal Swab  Result Value Ref Range Status   SARS Coronavirus 2 by RT PCR NEGATIVE NEGATIVE Final    Comment: (NOTE) SARS-CoV-2 target nucleic acids are NOT DETECTED.  The SARS-CoV-2 RNA is generally detectable in upper respiratory specimens during the acute phase of infection. The lowest concentration of SARS-CoV-2 viral copies this assay can detect is 138 copies/mL. A negative result does not preclude SARS-Cov-2 infection and should not be used as the sole basis for treatment or other patient management decisions. A negative result may occur with  improper specimen collection/handling, submission of specimen other than  nasopharyngeal swab, presence of viral mutation(s) within the areas targeted by this assay, and inadequate number of viral copies(<138 copies/mL). A negative result must be combined with clinical observations, patient history, and epidemiological information. The expected result is Negative.  Fact Sheet for Patients:  BloggerCourse.com  Fact Sheet for Healthcare Providers:  SeriousBroker.it  This test is no t yet approved or cleared by the [place] FDA and  has been authorized for detection and/or diagnosis of SARS-CoV-2 by FDA under an Emergency Use Authorization (EUA). This EUA will remain  in effect (meaning this test can be used) for the duration of the COVID-19 declaration under Section 564(b)(1) of the Act, 21 U.S.C.section 360bbb-3(b)(1), unless the authorization is terminated  or revoked sooner.       Influenza A by PCR NEGATIVE NEGATIVE Final   Influenza B by PCR NEGATIVE NEGATIVE Final    Comment: (NOTE) The Xpert Xpress SARS-CoV-2/FLU/RSV plus assay is intended as an aid in the diagnosis of influenza from Nasopharyngeal swab specimens and should not be used as a sole basis for treatment. Nasal washings and aspirates are unacceptable for Xpert Xpress SARS-CoV-2/FLU/RSV testing.  Fact Sheet for Patients: BloggerCourse.com  Fact Sheet for Healthcare Providers: SeriousBroker.it  This test is not yet approved or cleared by the [place] FDA and has been authorized for detection and/or diagnosis of SARS-CoV-2 by FDA under an Emergency Use Authorization (EUA). This EUA will remain in effect (meaning this test can be used) for the duration of the COVID-19 declaration under Section 564(b)(1) of the Act, 21 U.S.C. section 360bbb-3(b)(1), unless the authorization is terminated or revoked.  Performed at [facility], [address] [place], [place]  [address]   Culture, blood (routine x 2)     Status: None (Preliminary result)   Collection Time: 03/15/22  4:26 PM   Specimen: BLOOD  Result Value Ref Range Status   Specimen Description BLOOD BLOOD LEFT HAND  Final   Special Requests   Final    BOTTLES DRAWN AEROBIC AND ANAEROBIC Blood Culture adequate volume   Culture   Final    NO GROWTH 4 DAYS Performed at [facility], [address], [place], [place] [address]    Report Status PENDING  Incomplete  Culture, blood (routine x 2)     Status: None (Preliminary result)   Collection Time: 03/15/22  4:31 PM   Specimen: BLOOD  Result Value Ref Range Status   Specimen Description BLOOD BLOOD RIGHT HAND  Final   Special Requests  Final    BOTTLES DRAWN AEROBIC AND ANAEROBIC Blood Culture results may not be optimal due to an excessive volume of blood received in culture bottles   Culture   Final    NO GROWTH 4 DAYS Performed at [facility], [address], [place], [place] [address]    Report Status PENDING  Incomplete  Urine Culture     Status: Abnormal   Collection Time: 03/15/22  6:10 PM   Specimen: In/Out Cath Urine  Result Value Ref Range Status   Specimen Description   Final    IN/OUT CATH URINE Performed at [facility], [address], [place], [place] [address]    Special Requests   Final    NONE Performed at [facility], [address], [place], [place] [address]    Culture MULTIPLE SPECIES PRESENT, SUGGEST RECOLLECTION (A)  Final   Report Status 03/17/2022 FINAL  Final  MRSA culture     Status: None (Preliminary result)   Collection Time: 03/16/22 12:00 PM   Specimen: Nasal; Body Fluid  Result Value Ref Range Status   Specimen Description   Final    NASAL SWAB Performed at [facility], [address], [place], [place] [address]    Special Requests   Final    NONE Performed at [facility], [address] [place], [place] [address]    Culture   Final     CULTURE REINCUBATED FOR BETTER GROWTH Performed at [facility], [address], [place], [place] [address]    Report Status PENDING  Incomplete  MRSA Next Gen by PCR, Nasal     Status: None   Collection Time: 03/16/22 12:37 PM   Specimen: Nasal Mucosa; Nasal Swab  Result Value Ref Range Status   MRSA by PCR Next Gen NOT DETECTED NOT DETECTED Final    Comment: (NOTE) The GeneXpert MRSA Assay (FDA approved for NASAL specimens only), is one component of a comprehensive MRSA colonization surveillance program. It is not intended to diagnose MRSA infection nor to guide or monitor treatment for MRSA infections. Test performance is not FDA approved in patients less than 20 years old. Performed at [facility], [address], [place], [place] [address]       Radiology Studies: No results found.  Scheduled Meds:  bictegravir-emtricitabine-tenofovir AF  1 tablet Oral Daily   enoxaparin (LOVENOX) injection  40 mg Subcutaneous Q24H   lactulose  10 g Oral BID   mirtazapine  15 mg Oral QHS   thiamine  100 mg Oral Daily   Continuous Infusions:  piperacillin-tazobactam 3.375 g (03/19/22 0415)     LOS: 4 days   Time spent: 35 minutes   Shiza Thelen Estill Cotta, MD [facility]  If 7PM-7AM, please contact night-coverage [email or web address] 03/19/2022, 10:46 AM

## 2022-03-19 NOTE — Plan of Care (Signed)
  Problem: Activity: Goal: Risk for activity intolerance will decrease Outcome: Progressing   Problem: Nutrition: Goal: Adequate nutrition will be maintained Outcome: Progressing   Problem: Coping: Goal: Level of anxiety will decrease Outcome: Progressing   Problem: Pain Managment: Goal: General experience of comfort will improve Outcome: Progressing   Problem: Safety: Goal: Ability to remain free from injury will improve Outcome: Progressing   

## 2022-03-20 DIAGNOSIS — D649 Anemia, unspecified: Secondary | ICD-10-CM | POA: Diagnosis not present

## 2022-03-20 DIAGNOSIS — A419 Sepsis, unspecified organism: Secondary | ICD-10-CM | POA: Diagnosis not present

## 2022-03-20 DIAGNOSIS — F1920 Other psychoactive substance dependence, uncomplicated: Secondary | ICD-10-CM | POA: Diagnosis not present

## 2022-03-20 DIAGNOSIS — B20 Human immunodeficiency virus [HIV] disease: Secondary | ICD-10-CM | POA: Diagnosis not present

## 2022-03-20 DIAGNOSIS — R509 Fever, unspecified: Secondary | ICD-10-CM | POA: Diagnosis not present

## 2022-03-20 DIAGNOSIS — B1910 Unspecified viral hepatitis B without hepatic coma: Secondary | ICD-10-CM | POA: Diagnosis not present

## 2022-03-20 DIAGNOSIS — R591 Generalized enlarged lymph nodes: Secondary | ICD-10-CM | POA: Diagnosis not present

## 2022-03-20 LAB — T-HELPER CELLS CD4/CD8 %
% CD 4 Pos. Lymph.: 13.9 % — ABNORMAL LOW (ref 30.8–58.5)
Absolute CD 4 Helper: 181 /uL — ABNORMAL LOW (ref 359–1519)
Basophils Absolute: 0 10*3/uL (ref 0.0–0.2)
Basos: 0 %
CD3+CD4+ Cells/CD3+CD8+ Cells Bld: 0.2 — ABNORMAL LOW (ref 0.92–3.72)
CD3+CD8+ Cells # Bld: 897 /uL (ref 109–897)
CD3+CD8+ Cells NFr Bld: 69 % — ABNORMAL HIGH (ref 12.0–35.5)
EOS (ABSOLUTE): 0.2 10*3/uL (ref 0.0–0.4)
Eos: 4 %
Hematocrit: 24.5 % — ABNORMAL LOW (ref 37.5–51.0)
Hemoglobin: 7.9 g/dL — ABNORMAL LOW (ref 13.0–17.7)
Immature Grans (Abs): 0.1 10*3/uL (ref 0.0–0.1)
Immature Granulocytes: 1 %
Lymphocytes Absolute: 1.3 10*3/uL (ref 0.7–3.1)
Lymphs: 22 %
MCH: 25.6 pg — ABNORMAL LOW (ref 26.6–33.0)
MCHC: 32.2 g/dL (ref 31.5–35.7)
MCV: 80 fL (ref 79–97)
Monocytes Absolute: 1.2 10*3/uL — ABNORMAL HIGH (ref 0.1–0.9)
Monocytes: 20 %
Neutrophils Absolute: 3.2 10*3/uL (ref 1.4–7.0)
Neutrophils: 53 %
Platelets: 466 10*3/uL — ABNORMAL HIGH (ref 150–450)
RBC: 3.08 x10E6/uL — ABNORMAL LOW (ref 4.14–5.80)
RDW: 16.1 % — ABNORMAL HIGH (ref 11.6–15.4)
WBC: 6.1 10*3/uL (ref 3.4–10.8)

## 2022-03-20 LAB — MRSA CULTURE

## 2022-03-20 LAB — CULTURE, BLOOD (ROUTINE X 2)
Culture: NO GROWTH
Culture: NO GROWTH
Special Requests: ADEQUATE

## 2022-03-20 LAB — COMPREHENSIVE METABOLIC PANEL
ALT: 27 U/L (ref 0–44)
AST: 37 U/L (ref 15–41)
Albumin: 2.4 g/dL — ABNORMAL LOW (ref 3.5–5.0)
Alkaline Phosphatase: 450 U/L — ABNORMAL HIGH (ref 38–126)
Anion gap: 8 (ref 5–15)
BUN: 10 mg/dL (ref 6–20)
CO2: 22 mmol/L (ref 22–32)
Calcium: 8.8 mg/dL — ABNORMAL LOW (ref 8.9–10.3)
Chloride: 109 mmol/L (ref 98–111)
Creatinine, Ser: 0.81 mg/dL (ref 0.61–1.24)
GFR, Estimated: >60 mL/min (ref >60)
Glucose, Bld: 96 mg/dL (ref 70–99)
Potassium: 4 mmol/L (ref 3.5–5.1)
Sodium: 139 mmol/L (ref 135–145)
Total Bilirubin: 0.4 mg/dL (ref 0.3–1.2)
Total Protein: 7.4 g/dL (ref 6.5–8.1)

## 2022-03-20 LAB — ACID FAST SMEAR (AFB, MYCOBACTERIA): Acid Fast Smear: NEGATIVE

## 2022-03-20 LAB — CBC
HCT: 25.2 % — ABNORMAL LOW (ref 39.0–52.0)
Hemoglobin: 8 g/dL — ABNORMAL LOW (ref 13.0–17.0)
MCH: 25.5 pg — ABNORMAL LOW (ref 26.0–34.0)
MCHC: 31.7 g/dL (ref 30.0–36.0)
MCV: 80.3 fL (ref 80.0–100.0)
Platelets: 553 10*3/uL — ABNORMAL HIGH (ref 150–400)
RBC: 3.14 MIL/uL — ABNORMAL LOW (ref 4.22–5.81)
RDW: 17.1 % — ABNORMAL HIGH (ref 11.5–15.5)
WBC: 6.2 10*3/uL (ref 4.0–10.5)
nRBC: 0.3 % — ABNORMAL HIGH (ref 0.0–0.2)

## 2022-03-20 LAB — TOXOPLASMA ANTIBODIES- IGG AND  IGM
Toxoplasma Antibody- IgM: <3 AU/mL (ref 0.0–7.9)
Toxoplasma IgG Ratio: <3 IU/mL (ref 0.0–7.1)

## 2022-03-20 LAB — CMV DNA, QUANTITATIVE, PCR
CMV DNA Quant: 617 IU/mL
Log10 CMV Qn DNA Pl: 2.79 log10 IU/mL

## 2022-03-20 LAB — QUANTIFERON-TB GOLD PLUS (RQFGPL)
QuantiFERON Mitogen Value: >10 IU/mL
QuantiFERON Nil Value: 0.08 IU/mL
QuantiFERON TB1 Ag Value: 0.08 IU/mL
QuantiFERON TB2 Ag Value: 0.08 IU/mL

## 2022-03-20 LAB — EPSTEIN BARR VRS(EBV DNA BY PCR): EBV DNA QN by PCR: POSITIVE IU/mL

## 2022-03-20 LAB — QUANTIFERON-TB GOLD PLUS: QuantiFERON-TB Gold Plus: NEGATIVE

## 2022-03-20 MED ORDER — SODIUM CHLORIDE 3 % IN NEBU
4.0000 mL | INHALATION_SOLUTION | Freq: Once | RESPIRATORY_TRACT | Status: AC | PRN
  Administered 2022-03-21: 4 mL via RESPIRATORY_TRACT
  Filled 2022-03-20: qty 4

## 2022-03-20 MED ORDER — AMOXICILLIN-POT CLAVULANATE 875-125 MG PO TABS
1.0000 | ORAL_TABLET | Freq: Two times a day (BID) | ORAL | Status: DC
  Administered 2022-03-20 – 2022-03-22 (×5): 1 via ORAL
  Filled 2022-03-20 (×5): qty 1

## 2022-03-20 NOTE — Progress Notes (Signed)
PROGRESS NOTE  Michael Chandler  YQM:[id number] DOB: Nov 27, 1985 DOA: 03/15/2022 PCP: Georgann Housekeeper, MD   Brief Narrative: Patient is a 36 year old male history of HIV with last CD4 count of 80, depression,schizophrenia, recent history of cervical laminectomy presented here with complaint of neck pain, cough, dizziness, increased swelling of the neck.  On presentation, he was lethargic, somnolent, febrile.  Lab work showed leukocytosis, hemoglobin of 9.6.  ENT was consulted for left neck pain.  Found to have tenderness on the left neck with lymphadenopathy.  CT neck showed moderate lymphadenopathy with no cellulitis or abscess, also showed pansinus opacification worse on the left..  Patient was admitted for the management of sepsis secondary to neck lymphadenitis, sinusitis.  ID also consulted.  Currently on zosyn.  Planning for left-sided lymph node biopsy by IR.  Assessment & Plan:  Active Problems:   AIDS (acquired immune deficiency syndrome) (HCC)   Major depressive disorder, recurrent severe without psychotic features (HCC)   Candida esophagitis (HCC)   Syphilis   Lymphadenopathy   Pansinusitis   Sepsis (HCC)   Fever  Sepsis: Presented with fever, soft blood pressure, tachycardia, leukocytosis.  Patient is immunocompromised with history of HIV.  Started on broad-spectrum antibiotics. On zosyn. Follow-up cultures,NGTD. ID following, sepsis physiology has resolved.  Neck pain/lymphadenitis: ENT was consulted for left neck pain.  Found to have tenderness on the left neck with lymphadenopathy.  CT neck showed extensive lymphadenopathy with no cellulitis or abscess, also showed pansinus opacification worse on the left.  MRI of the cervical spine confirms significant lymphadenopathy, no discitis or abscess. He has history of cervical laminectomy in 2021. ID recommending for IR guided lymph node biopsy for fungal culture, pathology. TB could not be ruled out, AFB x1 have been negative,2nd is  pending,3rd will be sent today.Quantiferon pending.  Remains in airborne precaution.  HIV: On Biktarvy .recent  lab works show Treponema pallidum antibodies, positive hepatitis B surface antigen also with antibody.  He says he has history of hepatitis B which was treated.  ID following  History of recent Candida esophagitis: Was given Diflucan as an outpatient not picked up yet.  Given a dose of IV Diflucan 200 mg once, continue 100 mg oral daily to complete 14 days course  History of MDD: Patient was hospitalized in November for depression, suicidal ideation.  Currently his mood is stable, remains alert and oriented  Mild elevation of ammonia:Resolved  Normocytic anemia: This is most likely secondary to HIV.  Currently hemoglobin stable in the range of 8.         DVT prophylaxis:enoxaparin (LOVENOX) injection 40 mg Start: 03/17/22 2200 SCDs Start: 03/15/22 2325     Code Status: Full Code  Family Communication: None at bedside, plan discussed with the patient at the bedside  Patient status:Inpatient  Patient is from :Home  Anticipated discharge BM:WUXL  Estimated DC date:2-3 days   Consultants: ID,ENT  Procedures:None  Antimicrobials:  Anti-infectives (From admission, onward)    Start     Dose/Rate Route Frequency Ordered Stop   03/17/22 1000  fluconazole (DIFLUCAN) tablet 100 mg  Status:  Discontinued        100 mg Oral Daily 03/16/22 1131 03/17/22 0833   03/17/22 1000  fluconazole (DIFLUCAN) tablet 200 mg  Status:  Discontinued        200 mg Oral Daily 03/17/22 0833 03/17/22 1020   03/16/22 1730  azithromycin (ZITHROMAX) 500 mg in sodium chloride 0.9 % 250 mL IVPB  Status:  Discontinued  500 mg 250 mL/hr over 60 Minutes Intravenous Every 24 hours 03/15/22 2328 03/16/22 1506   03/16/22 0330  fluconazole (DIFLUCAN) IVPB 200 mg  Status:  Discontinued        200 mg 100 mL/hr over 60 Minutes Intravenous Every 24 hours 03/16/22 0230 03/16/22 1131   03/16/22 0300   vancomycin (VANCOREADY) IVPB 1250 mg/250 mL  Status:  Discontinued        1,250 mg 166.7 mL/hr over 90 Minutes Intravenous Every 8 hours 03/16/22 0212 03/17/22 1020   03/16/22 0130  bictegravir-emtricitabine-tenofovir AF (BIKTARVY) 50-200-25 MG per tablet 1 tablet        1 tablet Oral Daily 03/15/22 2328     03/16/22 0045  piperacillin-tazobactam (ZOSYN) IVPB 3.375 g        3.375 g 12.5 mL/hr over 240 Minutes Intravenous Every 8 hours 03/15/22 2328     03/15/22 1730  azithromycin (ZITHROMAX) 500 mg in sodium chloride 0.9 % 250 mL IVPB        500 mg 250 mL/hr over 60 Minutes Intravenous  Once 03/15/22 1715 03/15/22 1852   03/15/22 1700  vancomycin (VANCOREADY) IVPB 2000 mg/400 mL        2,000 mg 200 mL/hr over 120 Minutes Intravenous  Once 03/15/22 1652 03/15/22 1940   03/15/22 1645  piperacillin-tazobactam (ZOSYN) IVPB 3.375 g        3.375 g 100 mL/hr over 30 Minutes Intravenous  Once 03/15/22 1632 03/15/22 1727       Subjective: Patient seen and examined at the bedside today.  Hemodynamically stable remains ,very comfortable.  The left-sided neck pain is improving but he thinks that the swelling slightly spread on the upper neck.  Remains afebrile.  No new complaints  Objective: Vitals:   03/19/22 0733 03/19/22 0848 03/20/22 0013 03/20/22 0853  BP: 111/69  117/73 109/78  Pulse: 91  99 86  Resp: 16  20 18   Temp: 98.4 F (36.9 C)  (!) 97.3 F (36.3 C) (!) 97.3 F (36.3 C)  TempSrc:      SpO2: 100% 100% 100% 100%  Weight:      Height:        Intake/Output Summary (Last 24 hours) at 03/20/2022 1038 Last data filed at 03/20/2022 0900 Gross per 24 hour  Intake 120 ml  Output 3925 ml  Net -3805 ml   Filed Weights   03/15/22 1543  Weight: 88.4 kg    Examination:   General exam: Overall comfortable, not in distress HEENT: PERRL, lymphadenopathy with mild tenderness in the left neck Respiratory system:  no wheezes or crackles  Cardiovascular system: S1 & S2 heard,  RRR.  Gastrointestinal system: Abdomen is nondistended, soft and nontender. Central nervous system: Alert and oriented Extremities: No edema, no clubbing ,no cyanosis Skin: No rashes, no ulcers,no icterus     Data Reviewed: I have personally reviewed following labs and imaging studies  CBC: Recent Labs  Lab 03/13/22 1108 03/15/22 1548 03/16/22 0036 03/17/22 0357 03/17/22 0755 03/18/22 0648 03/19/22 0402 03/20/22 0607  WBC 22.0* 19.9* 13.7* 12.8*  --  6.1 6.3 6.2  NEUTROABS 13,882* 13.2*  --   --   --   --   --   --   HGB 9.6* 8.8* 8.5* 6.6* 8.2* 7.9* 8.5* 8.0*  HCT 29.3* 27.8* 27.6* 20.6* 25.8* 24.6* 26.8* 25.2*  MCV 81.2 80.8 82.6 80.5  --  79.9* 80.5 80.3  PLT 562* 455* 396 429*  --  454* 532* 553*   Basic  Metabolic Panel: Recent Labs  Lab 03/16/22 0844 03/17/22 0357 03/18/22 0648 03/19/22 0402 03/20/22 0607  NA 130* 135 137 137 139  K 4.4 3.6 4.0 4.2 4.0  CL 103 107 111 108 109  CO2 21* 21* 20* 20* 22  GLUCOSE 92 110* 98 101* 96  BUN 12 10 10 9 10   CREATININE 0.95 0.89 0.88 0.98 0.81  CALCIUM 8.1* 8.5* 8.6* 8.7* 8.8*     Recent Results (from the past 240 hour(s))  Resp Panel by RT-PCR (Flu A&B, Covid) Anterior Nasal Swab     Status: None   Collection Time: 03/15/22  3:48 PM   Specimen: Anterior Nasal Swab  Result Value Ref Range Status   SARS Coronavirus 2 by RT PCR NEGATIVE NEGATIVE Final    Comment: (NOTE) SARS-CoV-2 target nucleic acids are NOT DETECTED.  The SARS-CoV-2 RNA is generally detectable in upper respiratory specimens during the acute phase of infection. The lowest concentration of SARS-CoV-2 viral copies this assay can detect is 138 copies/mL. A negative result does not preclude SARS-Cov-2 infection and should not be used as the sole basis for treatment or other patient management decisions. A negative result may occur with  improper specimen collection/handling, submission of specimen other than nasopharyngeal swab, presence of viral  mutation(s) within the areas targeted by this assay, and inadequate number of viral copies(<138 copies/mL). A negative result must be combined with clinical observations, patient history, and epidemiological information. The expected result is Negative.  Fact Sheet for Patients:  03/17/22  Fact Sheet for Healthcare Providers:  BloggerCourse.com  This test is no t yet approved or cleared by the [employer] FDA and  has been authorized for detection and/or diagnosis of SARS-CoV-2 by FDA under an Emergency Use Authorization (EUA). This EUA will remain  in effect (meaning this test can be used) for the duration of the COVID-19 declaration under Section 564(b)(1) of the Act, 21 U.S.C.section 360bbb-3(b)(1), unless the authorization is terminated  or revoked sooner.       Influenza A by PCR NEGATIVE NEGATIVE Final   Influenza B by PCR NEGATIVE NEGATIVE Final    Comment: (NOTE) The Xpert Xpress SARS-CoV-2/FLU/RSV plus assay is intended as an aid in the diagnosis of influenza from Nasopharyngeal swab specimens and should not be used as a sole basis for treatment. Nasal washings and aspirates are unacceptable for Xpert Xpress SARS-CoV-2/FLU/RSV testing.  Fact Sheet for Patients: [place]  Fact Sheet for Healthcare Providers: BloggerCourse.com  This test is not yet approved or cleared by the [employer] FDA and has been authorized for detection and/or diagnosis of SARS-CoV-2 by FDA under an Emergency Use Authorization (EUA). This EUA will remain in effect (meaning this test can be used) for the duration of the COVID-19 declaration under Section 564(b)(1) of the Act, 21 U.S.C. section 360bbb-3(b)(1), unless the authorization is terminated or revoked.  Performed at [facility], [address] [place], [place]   Culture, blood (routine x 2)      Status: None   Collection Time: 03/15/22  4:26 PM   Specimen: BLOOD  Result Value Ref Range Status   Specimen Description BLOOD BLOOD LEFT HAND  Final   Special Requests   Final    BOTTLES DRAWN AEROBIC AND ANAEROBIC Blood Culture adequate volume   Culture   Final    NO GROWTH 5 DAYS Performed at [facility], [address], [place], [place]    Report Status 03/20/2022 FINAL  Final  Culture,  blood (routine x 2)     Status: None   Collection Time: 03/15/22  4:31 PM   Specimen: BLOOD  Result Value Ref Range Status   Specimen Description BLOOD BLOOD RIGHT HAND  Final   Special Requests   Final    BOTTLES DRAWN AEROBIC AND ANAEROBIC Blood Culture results may not be optimal due to an excessive volume of blood received in culture bottles   Culture   Final    NO GROWTH 5 DAYS Performed at [facility], [address] [place], [place] [address]    Report Status 03/20/2022 FINAL  Final  Urine Culture     Status: Abnormal   Collection Time: 03/15/22  6:10 PM   Specimen: In/Out Cath Urine  Result Value Ref Range Status   Specimen Description   Final    IN/OUT CATH URINE Performed at [facility], [address] [place], [place] [address]    Special Requests   Final    NONE Performed at [facility], [address] [place], [place] [address]    Culture MULTIPLE SPECIES PRESENT, SUGGEST RECOLLECTION (A)  Final   Report Status 03/17/2022 FINAL  Final  MRSA culture     Status: None   Collection Time: 03/16/22 12:00 PM   Specimen: Nasal; Body Fluid  Result Value Ref Range Status   Specimen Description   Final    NASAL SWAB Performed at [facility], [address] [place], [place] [address]    Special Requests   Final    NONE Performed at [facility], [address] [place], [place] [address]    Culture RARE STAPHYLOCOCCUS AUREUS  Final   Report Status 03/20/2022 FINAL  Final   Organism ID, Bacteria  STAPHYLOCOCCUS AUREUS  Final      Susceptibility   Staphylococcus aureus - MIC*    CIPROFLOXACIN <=0.5 SENSITIVE Sensitive     ERYTHROMYCIN >=8 RESISTANT Resistant     GENTAMICIN <=0.5 SENSITIVE Sensitive     OXACILLIN <=0.25 SENSITIVE Sensitive     TETRACYCLINE <=1 SENSITIVE Sensitive     VANCOMYCIN <=0.5 SENSITIVE Sensitive     TRIMETH/SULFA <=10 SENSITIVE Sensitive     CLINDAMYCIN RESISTANT Resistant     RIFAMPIN <=0.5 SENSITIVE Sensitive     Inducible Clindamycin POSITIVE Resistant     * RARE STAPHYLOCOCCUS AUREUS  MRSA Next Gen by PCR, Nasal     Status: None   Collection Time: 03/16/22 12:37 PM   Specimen: Nasal Mucosa; Nasal Swab  Result Value Ref Range Status   MRSA by PCR Next Gen NOT DETECTED NOT DETECTED Final    Comment: (NOTE) The GeneXpert MRSA Assay (FDA approved for NASAL specimens only), is one component of a comprehensive MRSA colonization surveillance program. It is not intended to diagnose MRSA infection nor to guide or monitor treatment for MRSA infections. Test performance is not FDA approved in patients less than 34 years old. Performed at [facility], [address] [place], [place] [address]   Acid Fast Smear (AFB)     Status: None   Collection Time: 03/16/22  8:40 PM   Specimen: Sputum  Result Value Ref Range Status   AFB Specimen Processing Concentration  Final   Acid Fast Smear Negative  Final    Comment: (NOTE) Performed At: [facility] [address] [place], [place] [id number] Jolene SchimkeNagendra Sanjai MD BM:[phone]    Source (AFB) SPUTUM  Final    Comment: Performed at [facility], [phone]  [address], [place], [place] [address]     Radiology Studies: No results found.  Scheduled Meds:  bictegravir-emtricitabine-tenofovir AF  1 tablet Oral Daily   enoxaparin (LOVENOX) injection  40 mg Subcutaneous Q24H   lactulose  10 g Oral BID   mirtazapine  15 mg Oral QHS   thiamine  100 mg Oral Daily   Continuous Infusions:   piperacillin-tazobactam 3.375 g (03/20/22 0451)     LOS: 5 days   Burnadette Pop, MD [facility] P11/27/2023, 10:38 AM

## 2022-03-20 NOTE — Progress Notes (Signed)
ID Pt feeling better No fever Neck pain better   O/e awake and alert Patient Vitals for the past 24 hrs:  BP Temp Pulse Resp SpO2  03/20/22 0853 109/78 (!) 97.3 F (36.3 C) 86 18 100 %  03/20/22 0013 117/73 (!) 97.3 F (36.3 C) 99 20 100 %   Chest b/l air entry [id number] Abd soft CNS non focal Neck- left side mass slightly smaller  Labs    Latest Ref Rng & Units 03/20/2022    6:07 AM 03/19/2022    4:02 AM 03/18/2022    6:48 AM  CBC  WBC 4.0 - 10.5 K/uL 6.2  6.3  6.1   Hemoglobin 13.0 - 17.0 g/dL 8.0  8.5  7.9   Hematocrit 39.0 - 52.0 % 25.2  26.8  24.6   Platelets 150 - 400 K/uL 553  532  454        Latest Ref Rng & Units 03/20/2022    6:07 AM 03/19/2022    4:02 AM 03/18/2022    6:48 AM  CMP  Glucose 70 - 99 mg/dL 96  101  98   BUN 6 - 20 mg/dL _0 Creatinine 0.61 - 1.24 mg/dL 0.81  0.98  0.88   Sodium 135 - 145 mmol/L 139  137  137   Potassium 3.5 - 5.1 mmol/L 4.0  4.2  4.0   Chloride 98 - 111 mmol/L 109  108  111   CO2 22 - 32 mmol/L _1 Calcium 8.9 - 10.3 mg/dL 8.8  8.7  8.6   Total Protein 6.5 - 8.1 g/dL 7.4     Total Bilirubin 0.3 - 1.2 mg/dL 0.4     Alkaline Phos 38 - 126 U/L 450     AST 15 - 41 U/L 37     ALT 0 - 44 U/L 27       ID work up CMV DNA 617- low level- will not treat now- will repeat EBV dna positive but < 35 copies Quantiferon TB gold pending AFB 11/23 Neg 11/25 pending Bartonella Toxo Cd4 pending Fungal antibodies pending Mycobacterium avium blood pending  Impression/recommendation   AIDS- has been non compliant until 2 weeks ago- now on Biktarvy Last VL from 03/13/22 is 3120 Cd4 from 11/20/.23 is 524 ( not sure whether it is accurate) on 03/02/22 it was 85- repeated on 03/18/22 and it is 191 ( 13.9%)  Left side neck mass which is painful- lymphadenoathy In a patient with AIDS the D.D is long ? Mycobacterium avium ( with anemia/high Alk po4- could be a paradoxical reaction due to HAART) sent blood culture for  Afb ?Acute bacteria infection Fungal ? TB ?? Lymphoma  Fever and leucocytosis on presentation with painful lymphadenoathy and CT showing necrotic LN makes acute infection likely Pansinuitis and left mastoid fluid On zosyn- leucocytosis resolved and no fever Change zosyn to PO augmentin  2 sputums for AFB Will need needle biopsy of the mass/LN to be sent for AFB, fungal culture, pathology - discussed with IR   Anemia  Resolved candidal esophagitis  Polysubstance use  HEPB infection - surface antigen, antibody both positive and DNA low level- could be an acute infection which is possibly clearing Will need to follow DNA  Treated syphilis   serofast 1:2- recent L[facility] on [facility]/14/[facility] at [facility]- neg-  VDRL neg as well   H/o cervical spine surgery- MRI  does not show any infection/discitis /  Discussed the management with the patient and care team

## 2022-03-20 NOTE — Consult Note (Signed)
Chief Complaint: Left sided cervical lymphadenopathy. Request is for left cervical lymph node biopsy  Referring Physician(s): Dr. Dessie Coma  Supervising Physician: Juliet Rude  Patient Status: [facility] - In-pt  History of Present Illness: Michael Chandler is a 36 y.o. male History of HIV, schizophrenia, cervical lamination 11.2023. Presented to the ED at [facility] on 11.22.23 with swelling and  pain to the neck and fever. Admitted for sepsis secondary to neck lymphadentitis, sinusitis. Found to have left sided lymphadenopathy. MR cervical from 11.23.23 reads  As demonstrated on the neck CT examination there is massive left-sided cervical lymphadenopathy.  Patient alert sitting up in bed. Endorses some left sided neck pain that has improved during admission. Denies any fevers, headache, chest pain, SOB, cough, abdominal pain, nausea, vomiting or bleeding.    Past Medical History:  Diagnosis Date   HIV (human immunodeficiency virus infection) ([place])    Schizophrenia ([place])    per IVC paperwork, pt states does not have this dx    History reviewed. No pertinent surgical history.  Allergies: Bactrim [sulfamethoxazole-trimethoprim]  Medications: Prior to Admission medications   Medication Sig Start Date End Date Taking? Authorizing Provider  benztropine (COGENTIN) 1 MG tablet Take 1 tablet (1 mg total) by mouth daily. Patient not taking: Reported on 02/28/2022 12/22/21   Clapacs, Madie Reno, MD  BIKTARVY 50-200-25 MG TABS tablet Take 1 tablet by mouth daily. 03/13/22   Mignon Pine, DO  dapsone 100 MG tablet Take 1 tablet (100 mg total) by mouth daily. 03/13/22 09/09/22  Mignon Pine, DO  gabapentin (NEURONTIN) 300 MG capsule Take 1 capsule (300 mg total) by mouth 3 (three) times daily. Patient not taking: Reported on 02/28/2022 12/21/21   Clapacs, Madie Reno, MD  haloperidol (HALDOL) 5 MG tablet Take 1 tablet (5 mg total) by mouth 2 (two) times daily. Patient not taking: Reported on  02/28/2022 12/21/21   Clapacs, Madie Reno, MD  mirtazapine (REMERON SOL-TAB) 15 MG disintegrating tablet Take 1 tablet (15 mg total) by mouth at bedtime. 03/07/22   Leevy-Johnson, Blaine Hamper, NP  traZODone (DESYREL) 150 MG tablet Take 1 tablet (150 mg total) by mouth at bedtime as needed for sleep. Patient not taking: Reported on 02/28/2022 12/21/21   Clapacs, Madie Reno, MD     History reviewed. No pertinent family history.  Social History   Socioeconomic History   Marital status: Single    Spouse name: Not on file   Number of children: Not on file   Years of education: Not on file   Highest education level: Not on file  Occupational History   Not on file  Tobacco Use   Smoking status: Former    Types: Cigarettes   Smokeless tobacco: Never   Tobacco comments:    Patient denies using tobacco products (03/13/22)  Substance and Sexual Activity   Alcohol use: Yes    Comment: occasional   Drug use: Yes    Types: Methamphetamines, Marijuana, Cocaine    Comment: states occasional marijuana and meth use   Sexual activity: Not Currently    Comment: condoms accepted  Other Topics Concern   Not on file  Social History Narrative   Not on file   Social Determinants of Health   Financial Resource Strain: Not on file  Food Insecurity: No Food Insecurity (03/16/2022)   Hunger Vital Sign    Worried About Running Out of Food in the Last Year: Never true    Ran Out of Food in the Last  Year: Never true  Recent Concern: Food Insecurity - Food Insecurity Present (03/02/2022)   Hunger Vital Sign    Worried About Running Out of Food in the Last Year: Not on file    Ran Out of Food in the Last Year: Sometimes true  Transportation Needs: No Transportation Needs (03/16/2022)   PRAPARE - Administrator, Civil Service (Medical): No    Lack of Transportation (Non-Medical): No  Physical Activity: Not on file  Stress: Not on file  Social Connections: Not on file     Review of Systems: A 12  point ROS discussed and pertinent positives are indicated in the HPI above.  All other systems are negative.  Review of Systems  Constitutional:  Negative for fever.  HENT:  Negative for congestion.   Respiratory:  Negative for cough and shortness of breath.   Cardiovascular:  Negative for chest pain.  Gastrointestinal:  Negative for abdominal pain.  Musculoskeletal:  Positive for neck pain (left sided with swelling). Negative for neck stiffness.  Neurological:  Negative for headaches.  Psychiatric/Behavioral:  Negative for behavioral problems and confusion.     Vital Signs: BP 109/78 (BP Location: Left Arm)   Pulse 86   Temp (!) 97.3 F (36.3 C)   Resp 18   Ht 6\' 5"  (1.956 m)   Wt 194 lb 14.2 oz (88.4 kg)   SpO2 100%   BMI 23.11 kg/m     Physical Exam Vitals and nursing note reviewed.  Constitutional:      Appearance: He is well-developed.  HENT:     Head: Normocephalic.  Neck:     Comments: Left sided neck swelling noted Cardiovascular:     Rate and Rhythm: Normal rate and regular rhythm.     Heart sounds: Normal heart sounds.  Pulmonary:     Effort: Pulmonary effort is normal.     Breath sounds: Normal breath sounds.  Musculoskeletal:        General: Normal range of motion.     Cervical back: Normal range of motion.  Skin:    General: Skin is dry.  Neurological:     Mental Status: He is alert and oriented to person, place, and time.     Imaging: MR THORACIC SPINE W WO CONTRAST  Result Date: 03/16/2022 CLINICAL DATA:  Immunocompromised with back pain and fever. EXAM: MRI CERVICAL, THORACIC AND LUMBAR SPINE WITHOUT AND WITH CONTRAST TECHNIQUE: Multiplanar and multiecho pulse sequences of the cervical spine, to include the craniocervical junction and cervicothoracic junction, and thoracic and lumbar spine, were obtained without and with intravenous contrast. CONTRAST:  66mL GADAVIST GADOBUTROL 1 MMOL/ML IV SOLN COMPARISON:  Neck CT 03/15/2022 and prior MR  examinations 09/29/2021. FINDINGS: MRI CERVICAL SPINE FINDINGS Limited examination due to patient motion. Alignment: Normal Vertebrae: Significant artifact associated with the anterior and posterior cervical fusion hardware. No acute bony findings or evidence of discitis or osteomyelitis. Cord: Grossly cord signal intensity. No cord lesions or syrinx. Posterior Fossa, vertebral arteries, paraspinal tissues: As demonstrated on the neck CT there is massive left-sided cervical lymphadenopathy. Disc levels: No cervical disc protrusions spinal or foraminal stenosis. Moderate thinning of the cervical cord at the level of the prior surgery which is stable. MRI THORACIC SPINE FINDINGS Alignment:  He normal Vertebrae: Normal marrow signal. No bone lesions or fractures. No findings for discitis or osteomyelitis. Cord: Normal cord signal intensity. No cord lesions, syrinx or abnormal cord enhancement. Paraspinal and other soft tissues: No significant findings. Disc levels:  Small left paracentral disc protrusion at T5-6. Small central disc protrusion at T8-9. Right paracentral disc protrusion at T9-10. MRI LUMBAR SPINE FINDINGS Segmentation: There are five lumbar type vertebral bodies. The last full intervertebral disc space is labeled L5-S1. Alignment:  Normal Vertebrae: Normal marrow signal. No bone lesions or fractures. No findings suspicious for discitis or osteomyelitis. Conus medullaris and cauda equina: Conus extends to the L1 level. Conus and cauda equina appear normal. Paraspinal and other soft tissues: No significant paraspinal or retroperitoneal findings. Disc levels: L1-2: No significant findings. L2-3: Disc desiccation and disc space narrowing with a small to moderate-sized disc extrusion with mass effect on the ventral thecal sac. Some disc material is coursing up behind L2 and down behind L3. L3-4: No significant findings. L4-5: Mild annular bulge but no spinal or foraminal stenosis. L5-S1 no significant  findings. IMPRESSION: 1. As demonstrated on the neck CT examination there is massive left-sided cervical lymphadenopathy. 2. No findings for discitis or osteomyelitis in the cervical, thoracic or lumbar spines. 3. Small thoracic disc protrusions but no significant spinal or foraminal stenosis. 4. Small to moderate-sized disc extrusion at L2-3 with mass effect on the ventral thecal sac. Electronically Signed   By: Marijo Sanes M.D.   On: 03/16/2022 14:53   MR Lumbar Spine W Wo Contrast  Result Date: 03/16/2022 CLINICAL DATA:  Immunocompromised with back pain and fever. EXAM: MRI CERVICAL, THORACIC AND LUMBAR SPINE WITHOUT AND WITH CONTRAST TECHNIQUE: Multiplanar and multiecho pulse sequences of the cervical spine, to include the craniocervical junction and cervicothoracic junction, and thoracic and lumbar spine, were obtained without and with intravenous contrast. CONTRAST:  76mL GADAVIST GADOBUTROL 1 MMOL/ML IV SOLN COMPARISON:  Neck CT 03/15/2022 and prior MR examinations 09/29/2021. FINDINGS: MRI CERVICAL SPINE FINDINGS Limited examination due to patient motion. Alignment: Normal Vertebrae: Significant artifact associated with the anterior and posterior cervical fusion hardware. No acute bony findings or evidence of discitis or osteomyelitis. Cord: Grossly cord signal intensity. No cord lesions or syrinx. Posterior Fossa, vertebral arteries, paraspinal tissues: As demonstrated on the neck CT there is massive left-sided cervical lymphadenopathy. Disc levels: No cervical disc protrusions spinal or foraminal stenosis. Moderate thinning of the cervical cord at the level of the prior surgery which is stable. MRI THORACIC SPINE FINDINGS Alignment:  He normal Vertebrae: Normal marrow signal. No bone lesions or fractures. No findings for discitis or osteomyelitis. Cord: Normal cord signal intensity. No cord lesions, syrinx or abnormal cord enhancement. Paraspinal and other soft tissues: No significant findings. Disc  levels: Small left paracentral disc protrusion at T5-6. Small central disc protrusion at T8-9. Right paracentral disc protrusion at T9-10. MRI LUMBAR SPINE FINDINGS Segmentation: There are five lumbar type vertebral bodies. The last full intervertebral disc space is labeled L5-S1. Alignment:  Normal Vertebrae: Normal marrow signal. No bone lesions or fractures. No findings suspicious for discitis or osteomyelitis. Conus medullaris and cauda equina: Conus extends to the L1 level. Conus and cauda equina appear normal. Paraspinal and other soft tissues: No significant paraspinal or retroperitoneal findings. Disc levels: L1-2: No significant findings. L2-3: Disc desiccation and disc space narrowing with a small to moderate-sized disc extrusion with mass effect on the ventral thecal sac. Some disc material is coursing up behind L2 and down behind L3. L3-4: No significant findings. L4-5: Mild annular bulge but no spinal or foraminal stenosis. L5-S1 no significant findings. IMPRESSION: 1. As demonstrated on the neck CT examination there is massive left-sided cervical lymphadenopathy. 2. No findings for discitis or  osteomyelitis in the cervical, thoracic or lumbar spines. 3. Small thoracic disc protrusions but no significant spinal or foraminal stenosis. 4. Small to moderate-sized disc extrusion at L2-3 with mass effect on the ventral thecal sac. Electronically Signed   By: Rudie MeyerP.  Gallerani M.D.   On: 03/16/2022 14:53   MR CERVICAL SPINE W WO CONTRAST  Result Date: 03/16/2022 CLINICAL DATA:  Immunocompromised with back pain and fever. EXAM: MRI CERVICAL, THORACIC AND LUMBAR SPINE WITHOUT AND WITH CONTRAST TECHNIQUE: Multiplanar and multiecho pulse sequences of the cervical spine, to include the craniocervical junction and cervicothoracic junction, and thoracic and lumbar spine, were obtained without and with intravenous contrast. CONTRAST:  9mL GADAVIST GADOBUTROL 1 MMOL/ML IV SOLN COMPARISON:  Neck CT 03/15/2022 and  prior MR examinations 09/29/2021. FINDINGS: MRI CERVICAL SPINE FINDINGS Limited examination due to patient motion. Alignment: Normal Vertebrae: Significant artifact associated with the anterior and posterior cervical fusion hardware. No acute bony findings or evidence of discitis or osteomyelitis. Cord: Grossly cord signal intensity. No cord lesions or syrinx. Posterior Fossa, vertebral arteries, paraspinal tissues: As demonstrated on the neck CT there is massive left-sided cervical lymphadenopathy. Disc levels: No cervical disc protrusions spinal or foraminal stenosis. Moderate thinning of the cervical cord at the level of the prior surgery which is stable. MRI THORACIC SPINE FINDINGS Alignment:  He normal Vertebrae: Normal marrow signal. No bone lesions or fractures. No findings for discitis or osteomyelitis. Cord: Normal cord signal intensity. No cord lesions, syrinx or abnormal cord enhancement. Paraspinal and other soft tissues: No significant findings. Disc levels: Small left paracentral disc protrusion at T5-6. Small central disc protrusion at T8-9. Right paracentral disc protrusion at T9-10. MRI LUMBAR SPINE FINDINGS Segmentation: There are five lumbar type vertebral bodies. The last full intervertebral disc space is labeled L5-S1. Alignment:  Normal Vertebrae: Normal marrow signal. No bone lesions or fractures. No findings suspicious for discitis or osteomyelitis. Conus medullaris and cauda equina: Conus extends to the L1 level. Conus and cauda equina appear normal. Paraspinal and other soft tissues: No significant paraspinal or retroperitoneal findings. Disc levels: L1-2: No significant findings. L2-3: Disc desiccation and disc space narrowing with a small to moderate-sized disc extrusion with mass effect on the ventral thecal sac. Some disc material is coursing up behind L2 and down behind L3. L3-4: No significant findings. L4-5: Mild annular bulge but no spinal or foraminal stenosis. L5-S1 no significant  findings. IMPRESSION: 1. As demonstrated on the neck CT examination there is massive left-sided cervical lymphadenopathy. 2. No findings for discitis or osteomyelitis in the cervical, thoracic or lumbar spines. 3. Small thoracic disc protrusions but no significant spinal or foraminal stenosis. 4. Small to moderate-sized disc extrusion at L2-3 with mass effect on the ventral thecal sac. Electronically Signed   By: Rudie MeyerP.  Gallerani M.D.   On: 03/16/2022 14:53   CT HEAD WO CONTRAST (5MM)  Result Date: 03/15/2022 CLINICAL DATA:  Altered mental status. EXAM: CT HEAD WITHOUT CONTRAST TECHNIQUE: Contiguous axial images were obtained from the base of the skull through the vertex without intravenous contrast. RADIATION DOSE REDUCTION: This exam was performed according to the departmental dose-optimization program which includes automated exposure control, adjustment of the mA and/or kV according to patient size and/or use of iterative reconstruction technique. COMPARISON:  Brain MRI dated 09/29/2021. FINDINGS: Brain: The ventricles and sulci are appropriate size for the patient's age. The gray-white matter discrimination is preserved. There is no acute intracranial hemorrhage. No mass effect or midline shift. No extra-axial fluid collection. Vascular:  No hyperdense vessel or unexpected calcification. Skull: Normal. Negative for fracture or focal lesion. Sinuses/Orbits: Diffuse mucoperiosteal thickening of paranasal sinuses with opacification of several ethmoid air cells as well as opacification of the left frontal and sphenoid sinuses. There is left mastoid effusions. Other: None IMPRESSION: 1. No acute intracranial pathology. 2. Paranasal sinus disease. Left mastoid effusions. Electronically Signed   By: Anner Crete M.D.   On: 03/15/2022 20:21   CT Soft Tissue Neck W Contrast  Addendum Date: 03/15/2022   ADDENDUM REPORT: 03/15/2022 20:08 ADDENDUM: There is a dictation error within the final line of the  impression, which should read Emphysema (ICD10-J43.9) rather than Aortic Atherosclerosis (ICD10-I70.0). Electronically Signed   By: Kellie Simmering D.O.   On: 03/15/2022 20:08   Result Date: 03/15/2022 CLINICAL DATA:  Provided history: Soft tissue infection suspected, neck, x-ray done. EXAM: CT NECK WITH CONTRAST TECHNIQUE: Multidetector CT imaging of the neck was performed using the standard protocol following the bolus administration of intravenous contrast. RADIATION DOSE REDUCTION: This exam was performed according to the departmental dose-optimization program which includes automated exposure control, adjustment of the mA and/or kV according to patient size and/or use of iterative reconstruction technique. CONTRAST:  24mL OMNIPAQUE IOHEXOL 300 MG/ML  SOLN COMPARISON:  None. FINDINGS: Pharynx and larynx: Streak and beam hardening artifact arising from dental restoration partially obscures the oral cavity. Within this limitation, there is no appreciable swelling or mass within the oral cavity, pharynx or larynx. No retropharyngeal collection. Salivary glands: The left submandibular gland appears anteriorly displaced by extensive conglomerate lymphadenopathy. The bilateral parotid and submandibular glands are otherwise unremarkable. Thyroid: Unremarkable. Lymph nodes: There is extensive lymphadenopathy throughout the left neck, much of which is conglomerate lymphadenopathy. Notably, an enlarged lymph node within the left lower neck has internal hypodensity, which may reflect suppuration or cystic/necrotic change (for instance as seen on series 6, image 74). A fat plane is poorly delineated between portions of this conglomerate lymphadenopathy and the left sternocleidomastoid muscle. A dominant conglomerate nodal mass measures 10.0 x 5.8 cm (for instance as seen on series 5, image 129). Vascular: The caudal left internal jugular vein is effaced by lymphadenopathy. The major vascular structures of the neck otherwise  appear patent. Limited intracranial: No evidence of acute intracranial abnormality within the field of view. Visualized orbits: No orbital mass or acute orbital finding. Mastoids and visualized paranasal sinuses: Extensive partial opacification of the left frontal sinus due to the presence of mucosal thickening and fluid. Partial opacification of the left greater than right ethmoid air cells due to the presence of mucosal thickening and fluid. Near complete opacification of the left sphenoid sinus to the presence of mucosal thickening and fluid. Moderate to severe partial opacification of the bilateral maxillary sinuses due to the presence of mucosal thickening and fluid. Left mastoid effusion. Skeleton: ACDF hardware spanning the C4-C7 levels. Corpectomy cages are present at C5 and C6. A posterior spinal fusion construct spans the C3-T1 levels. Prior posterior decompression at the C3-C4 through C7-T1 levels. No acute fracture or aggressive osseous lesion. Upper chest: Centrilobular and paraseptal emphysema within the imaged lung apices. These results were called by telephone at the time of interpretation on 03/15/2022 at 5:36 pm to provider [facility] , who verbally acknowledged these results. IMPRESSION: Extensive lymphadenopathy throughout the left neck, as described. This lymphadenopathy may be due to an infectious etiology. Alternatively, this could reflect sequelae of lymphoma or nodal metastatic disease. Correlate clinically and consider direct tissue sampling. Lymphadenopathy effaces the  caudal left internal jugular vein. Pansinusitis. Large left mastoid effusion. Postsurgical changes to the cervical spine, as described. Aortic Atherosclerosis (ICD10-I70.0). Electronically Signed: By: Kellie Simmering D.O. On: 03/15/2022 17:38   DG Chest 2 View  Result Date: 03/15/2022 CLINICAL DATA:  Fever. Recent pneumonia. Neck pain after neck surgery in 2021. EXAM: CHEST - 2 VIEW COMPARISON:  03/01/2022 FINDINGS:  Normal heart size and pulmonary vascularity. No focal airspace disease or consolidation in the lungs. No blunting of costophrenic angles. No pneumothorax. Mediastinal contours appear intact. Postoperative changes in the lower cervical spine. IMPRESSION: No active cardiopulmonary disease. Electronically Signed   By: Lucienne Capers M.D.   On: 03/15/2022 17:28   DG Chest 2 View  Result Date: 03/01/2022 CLINICAL DATA:  Post pneumonia EXAM: CHEST - 2 VIEW COMPARISON:  10/03/2021 FINDINGS: Cardiac size is within normal limits. There are no signs of pulmonary edema or focal pulmonary consolidation. There is no pleural effusion or pneumothorax. There is surgical fusion in cervical spine. IMPRESSION: No active cardiopulmonary disease. Electronically Signed   By: Elmer Picker M.D.   On: 03/01/2022 12:46    Labs:  CBC: Recent Labs    03/17/22 0357 03/17/22 0755 03/18/22 0648 03/19/22 0402 03/20/22 0607  WBC 12.8*  --  6.1 6.3 6.2  HGB 6.6* 8.2* 7.9* 8.5* 8.0*  HCT 20.6* 25.8* 24.6* 26.8* 25.2*  PLT 429*  --  454* 532* 553*    COAGS: No results for input(s): "INR", "APTT" in the last 8760 hours.  BMP: Recent Labs    03/17/22 0357 03/18/22 0648 03/19/22 0402 03/20/22 0607  NA 135 137 137 139  K 3.6 4.0 4.2 4.0  CL 107 111 108 109  CO2 21* 20* 20* 22  GLUCOSE 110* 98 101* 96  BUN 10 10 9 10   CALCIUM 8.5* 8.6* 8.7* 8.8*  CREATININE 0.89 0.88 0.98 0.81  GFRNONAA >60 >60 >60 >60    LIVER FUNCTION TESTS: Recent Labs    03/15/22 1548 03/16/22 0844 03/17/22 0357 03/20/22 0607  BILITOT 0.5 0.5 0.5 0.4  AST 29 26 27  37  ALT 26 21 20 27   ALKPHOS 346* 297* 315* 450*  PROT 8.3* 7.3 7.2 7.4  ALBUMIN 2.8* 2.5* 2.3* 2.4*      Assessment and Plan:  36 y.o. male inpatient. History of HIV, schizophrenia, cervical lamination 11.2023. Presented to the ED at [facility] on 11.22.23 with swelling and  pain to the neck and fever. Admitted for sepsis secondary to neck lymphadentitis,  sinusitis. Found to have left sided lymphadenopathy. MR cervical from 11.23.23 reads  As demonstrated on the neck CT examination there is massive left-sided cervical lymphadenopathy.  no leukocytosis. Alkaline Phosphatase 450. Afebrile. He is on lovenox prophylactic. Allergies include bactrim. Patient is on airborne precautions. Patient is requesting sedation.  NPO since 0900  Risks and benefits of left sided cervical lymph node biopsy was discussed with the patient and/or patient's family including, but not limited to bleeding, infection, damage to adjacent structures or low yield requiring additional tests.  All of the questions were answered and there is agreement to proceed.  Consent signed and in [place].   Thank you for this interesting consult.  I greatly enjoyed meeting Michael Chandler and look forward to participating in their care.  A copy of this report was sent to the requesting provider on this date.  Electronically Signed: Jacqualine Mau, NP 03/20/2022, 11:45 AM   I spent a total of 40 Minutes    in face to  face in clinical consultation, greater than 50% of which was counseling/coordinating care for left sided cervical lymph node biopsy

## 2022-03-20 NOTE — Telephone Encounter (Signed)
Patient currently in hospital.

## 2022-03-21 ENCOUNTER — Inpatient Hospital Stay: Payer: No Typology Code available for payment source | Admitting: Radiology

## 2022-03-21 DIAGNOSIS — B20 Human immunodeficiency virus [HIV] disease: Secondary | ICD-10-CM | POA: Diagnosis not present

## 2022-03-21 DIAGNOSIS — D649 Anemia, unspecified: Secondary | ICD-10-CM

## 2022-03-21 DIAGNOSIS — B1910 Unspecified viral hepatitis B without hepatic coma: Secondary | ICD-10-CM

## 2022-03-21 DIAGNOSIS — R509 Fever, unspecified: Secondary | ICD-10-CM | POA: Diagnosis not present

## 2022-03-21 DIAGNOSIS — D849 Immunodeficiency, unspecified: Secondary | ICD-10-CM

## 2022-03-21 DIAGNOSIS — R591 Generalized enlarged lymph nodes: Secondary | ICD-10-CM | POA: Diagnosis not present

## 2022-03-21 DIAGNOSIS — F1920 Other psychoactive substance dependence, uncomplicated: Secondary | ICD-10-CM

## 2022-03-21 DIAGNOSIS — R590 Localized enlarged lymph nodes: Secondary | ICD-10-CM | POA: Diagnosis not present

## 2022-03-21 HISTORY — PX: IR US GUIDE BX ASP/DRAIN: IMG2392

## 2022-03-21 LAB — CRYPTOCOCCAL ANTIGEN: Crypto Ag: NEGATIVE

## 2022-03-21 LAB — MISC LABCORP TEST (SEND OUT): Labcorp test code: 164284

## 2022-03-21 LAB — HISTOPLASMA ANTIGEN, URINE: Histoplasma Antigen, urine: <0.5 (ref <0.5)

## 2022-03-21 MED ORDER — FENTANYL CITRATE (PF) 100 MCG/2ML IJ SOLN
INTRAMUSCULAR | Status: AC
  Filled 2022-03-21: qty 2

## 2022-03-21 MED ORDER — FENTANYL CITRATE (PF) 100 MCG/2ML IJ SOLN
INTRAMUSCULAR | Status: AC | PRN
  Administered 2022-03-21: 50 ug via INTRAVENOUS

## 2022-03-21 MED ORDER — SODIUM CHLORIDE 3 % IN NEBU
4.0000 mL | INHALATION_SOLUTION | Freq: Every day | RESPIRATORY_TRACT | Status: DC
  Administered 2022-03-21: 4 mL via RESPIRATORY_TRACT
  Filled 2022-03-21 (×2): qty 4

## 2022-03-21 MED ORDER — MIDAZOLAM HCL 2 MG/2ML IJ SOLN
INTRAMUSCULAR | Status: AC | PRN
  Administered 2022-03-21: 1 mg via INTRAVENOUS

## 2022-03-21 MED ORDER — LIDOCAINE HCL 1 % IJ SOLN
INTRAMUSCULAR | Status: AC
  Administered 2022-03-21: 6 mL
  Filled 2022-03-21: qty 20

## 2022-03-21 MED ORDER — MIDAZOLAM HCL 2 MG/2ML IJ SOLN
INTRAMUSCULAR | Status: AC
  Filled 2022-03-21: qty 2

## 2022-03-21 NOTE — Progress Notes (Signed)
PROGRESS NOTE  Michael Chandler  DXA:[id number] DOB: 04-18-1986 DOA: 03/15/2022 PCP: Georgann Housekeeper, MD   Brief Narrative: Patient is a 36 year old male history of HIV with last CD4 count of 80, depression,schizophrenia, recent history of cervical laminectomy presented here with complaint of neck pain, cough, dizziness, increased swelling of the neck.  On presentation, he was lethargic, somnolent, febrile.  Lab work showed leukocytosis, hemoglobin of 9.6.  ENT was consulted for left neck pain.  Found to have tenderness on the left neck with lymphadenopathy.  CT neck showed moderate lymphadenopathy with no cellulitis or abscess, also showed pansinus opacification worse on the left..  Patient was admitted for the management of sepsis secondary to neck lymphadenitis, sinusitis.  ID also consulted.  Planning for left-sided lymph node biopsy by IR.  Assessment & Plan:  Active Problems:   AIDS (acquired immune deficiency syndrome) (HCC)   Major depressive disorder, recurrent severe without psychotic features (HCC)   Candida esophagitis (HCC)   Syphilis   Lymphadenopathy   Pansinusitis   Sepsis (HCC)   Fever  Sepsis: Presented with fever, soft blood pressure, tachycardia, leukocytosis.  Patient is immunocompromised with history of HIV.  Started on broad-spectrum antibiotics. Was on zosyn,now changed to augmentin. Follow-up cultures,NGTD. ID following, sepsis physiology has resolved.  Neck pain/lymphadenitis: ENT was consulted on admission for left neck pain.  Found to have tenderness on the left neck with lymphadenopathy.  CT neck showed extensive lymphadenopathy with no cellulitis or abscess, also showed pansinus opacification worse on the left.  MRI of the cervical spine confirms significant lymphadenopathy, no discitis or abscess. He has history of cervical laminectomy in 2021. ID recommending for IR guided lymph node biopsy for fungal culture, pathology. TB could not be ruled out, AFB x2 have been  negative,,3rd is pending.Quantiferon negative.  Remains in airborne precaution. F/U AFB, fungal culture from lymph node specimen  HIV: On Biktarvy .recent  lab works show Treponema pallidum antibodies, positive hepatitis B surface antigen also with antibody.  He says he has history of hepatitis B which was treated.  ID following  History of recent Candida esophagitis: Was given Diflucan as an outpatient not picked up yet.  Given a dose of IV Diflucan 200 mg once, continue 100 mg oral daily to complete 14 days course  History of MDD: Patient was hospitalized in November for depression, suicidal ideation.  Currently his mood is stable, remains alert and oriented  Mild elevation of ammonia:Resolved  Normocytic anemia: This is most likely secondary to HIV.  Currently hemoglobin stable in the range of 8.         DVT prophylaxis:enoxaparin (LOVENOX) injection 40 mg Start: 03/17/22 2200 SCDs Start: 03/15/22 2325     Code Status: Full Code  Family Communication: None at bedside, plan discussed with the patient at the bedside  Patient status:Inpatient  Patient is from :Home  Anticipated discharge MC:NOBS  Estimated DC date: After full work-up  Consultants: ID,ENT  Procedures:None  Antimicrobials:  Anti-infectives (From admission, onward)    Start     Dose/Rate Route Frequency Ordered Stop   03/20/22 1300  amoxicillin-clavulanate (AUGMENTIN) 875-125 MG per tablet 1 tablet        1 tablet Oral Every 12 hours 03/20/22 1211     03/17/22 1000  fluconazole (DIFLUCAN) tablet 100 mg  Status:  Discontinued        100 mg Oral Daily 03/16/22 1131 03/17/22 0833   03/17/22 1000  fluconazole (DIFLUCAN) tablet 200 mg  Status:  Discontinued  200 mg Oral Daily 03/17/22 0833 03/17/22 1020   03/16/22 1730  azithromycin (ZITHROMAX) 500 mg in sodium chloride 0.9 % 250 mL IVPB  Status:  Discontinued        500 mg 250 mL/hr over 60 Minutes Intravenous Every 24 hours 03/15/22 2328 03/16/22  1506   03/16/22 0330  fluconazole (DIFLUCAN) IVPB 200 mg  Status:  Discontinued        200 mg 100 mL/hr over 60 Minutes Intravenous Every 24 hours 03/16/22 0230 03/16/22 1131   03/16/22 0300  vancomycin (VANCOREADY) IVPB 1250 mg/250 mL  Status:  Discontinued        1,250 mg 166.7 mL/hr over 90 Minutes Intravenous Every 8 hours 03/16/22 0212 03/17/22 1020   03/16/22 0130  bictegravir-emtricitabine-tenofovir AF (BIKTARVY) 50-200-25 MG per tablet 1 tablet        1 tablet Oral Daily 03/15/22 2328     03/16/22 0045  piperacillin-tazobactam (ZOSYN) IVPB 3.375 g  Status:  Discontinued        3.375 g 12.5 mL/hr over 240 Minutes Intravenous Every 8 hours 03/15/22 2328 03/20/22 1211   03/15/22 1730  azithromycin (ZITHROMAX) 500 mg in sodium chloride 0.9 % 250 mL IVPB        500 mg 250 mL/hr over 60 Minutes Intravenous  Once 03/15/22 1715 03/15/22 1852   03/15/22 1700  vancomycin (VANCOREADY) IVPB 2000 mg/400 mL        2,000 mg 200 mL/hr over 120 Minutes Intravenous  Once 03/15/22 1652 03/15/22 1940   03/15/22 1645  piperacillin-tazobactam (ZOSYN) IVPB 3.375 g        3.375 g 100 mL/hr over 30 Minutes Intravenous  Once 03/15/22 1632 03/15/22 1727       Subjective: Patient seen and examined at bedside today.  Hemodynamically stable.  Comfortable.  Denies any significant left-sided neck pain.  There is minimal tenderness on palpation.  He is frustrated because he is hungry and has been kept n.p.o. for the procedure. Objective: Vitals:   03/20/22 0853 03/21/22 0006 03/21/22 0839 03/21/22 1135  BP: 109/78 102/62 130/67 112/70  Pulse: 86 98 85 84  Resp: Temp: (!) 97.3 F (36.3 C) 98.3 F (36.8 C) 97.7 F (36.5 C)   TempSrc:      SpO2: 100% 100% 100% 100%  Weight:      Height:        Intake/Output Summary (Last 24 hours) at 03/21/2022 1148 Last data filed at 03/21/2022 0841 Gross per 24 hour  Intake --  Output 825 ml  Net -825 ml   Filed Weights   03/15/22 1543  Weight:  88.4 kg    Examination:   General exam: Overall comfortable, not in distress HEENT: PERRL, mild left-sided neck swelling with lymphadenopathy Respiratory system:  no wheezes or crackles  Cardiovascular system: S1 & S2 heard, RRR.  Gastrointestinal system: Abdomen is nondistended, soft and nontender. Central nervous system: Alert and oriented Extremities: No edema, no clubbing ,no cyanosis Skin: No rashes, no ulcers,no icterus       Data Reviewed: I have personally reviewed following labs and imaging studies  CBC: Recent Labs  Lab 03/15/22 1548 03/16/22 0036 03/17/22 0357 03/17/22 0755 03/18/22 0648 03/19/22 0402 03/20/22 0607  WBC 19.9*   < > 12.8*  --  6.1  6.1 6.3 6.2  NEUTROABS 13.2*  --   --   --  3.2  --   --   HGB 8.8*   < > 6.6* 8.2*  7.9*  7.9* 8.5* 8.0*  HCT 27.8*   < > 20.6* 25.8* 24.6*  24.5* 26.8* 25.2*  MCV 80.8   < > 80.5  --  79.9*  80 80.5 80.3  PLT 455*   < > 429*  --  454*  466* 532* 553*   < > = values in this interval not displayed.   Basic Metabolic Panel: Recent Labs  Lab 03/16/22 0844 03/17/22 0357 03/18/22 0648 03/19/22 0402 03/20/22 0607  NA 130* 135 137 137 139  K 4.4 3.6 4.0 4.2 4.0  CL 103 107 111 108 109  CO2 21* 21* 20* 20* 22  GLUCOSE 92 110* 98 101* 96  BUN 12 10 10 9 10   CREATININE 0.95 0.89 0.88 0.98 0.81  CALCIUM 8.1* 8.5* 8.6* 8.7* 8.8*     Recent Results (from the past 240 hour(s))  Resp Panel by RT-PCR (Flu A&B, Covid) Anterior Nasal Swab     Status: None   Collection Time: 03/15/22  3:48 PM   Specimen: Anterior Nasal Swab  Result Value Ref Range Status   SARS Coronavirus 2 by RT PCR NEGATIVE NEGATIVE Final    Comment: (NOTE) SARS-CoV-2 target nucleic acids are NOT DETECTED.  The SARS-CoV-2 RNA is generally detectable in upper respiratory specimens during the acute phase of infection. The lowest concentration of SARS-CoV-2 viral copies this assay can detect is 138 copies/mL. A negative result does not  preclude SARS-Cov-2 infection and should not be used as the sole basis for treatment or other patient management decisions. A negative result may occur with  improper specimen collection/handling, submission of specimen other than nasopharyngeal swab, presence of viral mutation(s) within the areas targeted by this assay, and inadequate number of viral copies(<138 copies/mL). A negative result must be combined with clinical observations, patient history, and epidemiological information. The expected result is Negative.  Fact Sheet for Patients:  03/17/22  Fact Sheet for Healthcare Providers:  BloggerCourse.com  This test is no t yet approved or cleared by the [employer] FDA and  has been authorized for detection and/or diagnosis of SARS-CoV-2 by FDA under an Emergency Use Authorization (EUA). This EUA will remain  in effect (meaning this test can be used) for the duration of the COVID-19 declaration under Section 564(b)(1) of the Act, 21 U.S.C.section 360bbb-3(b)(1), unless the authorization is terminated  or revoked sooner.       Influenza A by PCR NEGATIVE NEGATIVE Final   Influenza B by PCR NEGATIVE NEGATIVE Final    Comment: (NOTE) The Xpert Xpress SARS-CoV-2/FLU/RSV plus assay is intended as an aid in the diagnosis of influenza from Nasopharyngeal swab specimens and should not be used as a sole basis for treatment. Nasal washings and aspirates are unacceptable for Xpert Xpress SARS-CoV-2/FLU/RSV testing.  Fact Sheet for Patients: [place]  Fact Sheet for Healthcare Providers: BloggerCourse.com  This test is not yet approved or cleared by the [employer] FDA and has been authorized for detection and/or diagnosis of SARS-CoV-2 by FDA under an Emergency Use Authorization (EUA). This EUA will remain in effect (meaning this test can be used) for the  duration of the COVID-19 declaration under Section 564(b)(1) of the Act, 21 U.S.C. section 360bbb-3(b)(1), unless the authorization is terminated or revoked.  Performed at [facility], [address] [place], [place]   Culture, blood (routine x 2)     Status: None   Collection Time: 03/15/22  4:26 PM   Specimen: BLOOD  Result Value Ref Range Status  Specimen Description BLOOD BLOOD LEFT HAND  Final   Special Requests   Final    BOTTLES DRAWN AEROBIC AND ANAEROBIC Blood Culture adequate volume   Culture   Final    NO GROWTH 5 DAYS Performed at [facility], [address] [place], [place] [address]    Report Status 03/20/2022 FINAL  Final  Culture, blood (routine x 2)     Status: None   Collection Time: 03/15/22  4:31 PM   Specimen: BLOOD  Result Value Ref Range Status   Specimen Description BLOOD BLOOD RIGHT HAND  Final   Special Requests   Final    BOTTLES DRAWN AEROBIC AND ANAEROBIC Blood Culture results may not be optimal due to an excessive volume of blood received in culture bottles   Culture   Final    NO GROWTH 5 DAYS Performed at [facility], [address], [place], [place] [address]    Report Status 03/20/2022 FINAL  Final  Urine Culture     Status: Abnormal   Collection Time: 03/15/22  6:10 PM   Specimen: In/Out Cath Urine  Result Value Ref Range Status   Specimen Description   Final    IN/OUT CATH URINE Performed at [facility], [address], [place], [place] [address]    Special Requests   Final    NONE Performed at [facility], [address] [place], [place] [address]    Culture MULTIPLE SPECIES PRESENT, SUGGEST RECOLLECTION (A)  Final   Report Status 03/17/2022 FINAL  Final  MRSA culture     Status: None   Collection Time: 03/16/22 12:00 PM   Specimen: Nasal; Body Fluid  Result Value Ref Range Status   Specimen Description   Final    NASAL SWAB Performed at [facility],  [address], [place], [place] [address]    Special Requests   Final    NONE Performed at [facility], [address] [place], [place] [address]    Culture RARE STAPHYLOCOCCUS AUREUS  Final   Report Status 03/20/2022 FINAL  Final   Organism ID, Bacteria STAPHYLOCOCCUS AUREUS  Final      Susceptibility   Staphylococcus aureus - MIC*    CIPROFLOXACIN <=0.5 SENSITIVE Sensitive     ERYTHROMYCIN >=8 RESISTANT Resistant     GENTAMICIN <=0.5 SENSITIVE Sensitive     OXACILLIN <=0.25 SENSITIVE Sensitive     TETRACYCLINE <=1 SENSITIVE Sensitive     VANCOMYCIN <=0.5 SENSITIVE Sensitive     TRIMETH/SULFA <=10 SENSITIVE Sensitive     CLINDAMYCIN RESISTANT Resistant     RIFAMPIN <=0.5 SENSITIVE Sensitive     Inducible Clindamycin POSITIVE Resistant     * RARE STAPHYLOCOCCUS AUREUS  MRSA Next Gen by PCR, Nasal     Status: None   Collection Time: 03/16/22 12:37 PM   Specimen: Nasal Mucosa; Nasal Swab  Result Value Ref Range Status   MRSA by PCR Next Gen NOT DETECTED NOT DETECTED Final    Comment: (NOTE) The GeneXpert MRSA Assay (FDA approved for NASAL specimens only), is one component of a comprehensive MRSA colonization surveillance program. It is not intended to diagnose MRSA infection nor to guide or monitor treatment for MRSA infections. Test performance is not FDA approved in patients less than 28 years old. Performed at [facility], [address] [place], [place] [address]   Acid Fast Smear (AFB)     Status: None   Collection Time: 03/16/22  8:40 PM  Specimen: Sputum  Result Value Ref Range Status   AFB Specimen Processing Concentration  Final   Acid Fast Smear Negative  Final    Comment: (NOTE) Performed At: [facility] [address] [place], [place] [id number] Jolene SchimkeNagendra Sanjai MD ON:[id number]:[phone]    Source (AFB) SPUTUM  Final    Comment: Performed at [facility], [address] [place], [place] [address]  Acid Fast Smear (AFB)      Status: None   Collection Time: 03/18/22  7:30 PM   Specimen: Sputum  Result Value Ref Range Status   AFB Specimen Processing Concentration  Final   Acid Fast Smear Negative  Final    Comment: (NOTE) Performed At: [facility] [address] [place], [place] [id number] Jolene SchimkeNagendra Sanjai MD IH:[id number]:[phone]    Source (AFB) SPUTUM  Final    Comment: Performed at [facility], [address] [place], [place] [address]     Radiology Studies: No results found.  Scheduled Meds:  amoxicillin-clavulanate  1 tablet Oral Q12H   bictegravir-emtricitabine-tenofovir AF  1 tablet Oral Daily   enoxaparin (LOVENOX) injection  40 mg Subcutaneous Q24H   fentaNYL       lactulose  10 g Oral BID   lidocaine       midazolam       mirtazapine  15 mg Oral QHS   thiamine  100 mg Oral Daily   Continuous Infusions:     LOS: 6 days   Burnadette PopAmrit Neeley Sedivy, MD [facility] P11/28/2023, 11:48 AM

## 2022-03-21 NOTE — TOC Progression Note (Signed)
Attempt to reach patient to complete risk assessment by phone. Patient line rings busy. Will reattempt later.

## 2022-03-21 NOTE — Progress Notes (Signed)
ID Underwent ln biopsy today Mom at bed side He is feeling better   O/e awake and alert Patient Vitals for the past 24 hrs:  BP Temp Pulse Resp SpO2  03/21/22 1703 124/72 98.4 F (36.9 C) (!) 109 18 100 %  03/21/22 1247 115/85 97.6 F (36.4 C) 81 18 100 %  03/21/22 1227 100/61 -- 84 -- 99 %  03/21/22 1215 100/61 -- 83 -- 100 %  03/21/22 1212 104/63 -- 82 -- 98 %  03/21/22 1200 100/68 -- 84 -- 100 %  03/21/22 1155 97/65 -- 81 20 100 %  03/21/22 1150 105/62 -- 80 19 100 %  03/21/22 1145 112/70 -- 78 16 100 %  03/21/22 1135 112/70 -- 84 18 100 %  03/21/22 0839 130/67 97.7 F (36.5 C) 85 16 100 %  03/21/22 0006 102/62 98.3 F (36.8 C) 98 16 100 %   Chest b/l air entry [id number] Abd soft CNS non focal Neck- left side mass slightly smaller- dresisng in place  Labs    Latest Ref Rng & Units 03/20/2022    6:07 AM 03/19/2022    4:02 AM 03/18/2022    6:48 AM  CBC  WBC 4.0 - 10.5 K/uL 6.2  6.3  6.1    6.1   Hemoglobin 13.0 - 17.0 g/dL 8.0  8.5  7.9    7.9   Hematocrit 39.0 - 52.0 % 25.2  26.8  24.6    24.5   Platelets 150 - 400 K/uL 553  532  454    466        Latest Ref Rng & Units 03/20/2022    6:07 AM 03/19/2022    4:02 AM 03/18/2022    6:48 AM  CMP  Glucose 70 - 99 mg/dL 96  101  98   BUN 6 - 20 mg/dL _0 Creatinine 0.61 - 1.24 mg/dL 0.81  0.98  0.88   Sodium 135 - 145 mmol/L 139  137  137   Potassium 3.5 - 5.1 mmol/L 4.0  4.2  4.0   Chloride 98 - 111 mmol/L 109  108  111   CO2 22 - 32 mmol/L _1 Calcium 8.9 - 10.3 mg/dL 8.8  8.7  8.6   Total Protein 6.5 - 8.1 g/dL 7.4     Total Bilirubin 0.3 - 1.2 mg/dL 0.4     Alkaline Phos 38 - 126 U/L 450     AST 15 - 41 U/L 37     ALT 0 - 44 U/L 27       ID work up CMV DNA 617- low level- will not treat now- will repeat EBV dna positive but < 35 copies Quantiferon TB gold pending Sputum AFB 11/23 Neg 11/25 neg 11/28 P Bartonella Toxo Cd4 pending Fungal antibodies pending Mycobacterium avium  blood pending  Impression/recommendation   AIDS- has been non compliant until 2 weeks ago- now on Biktarvy Last VL from 03/13/22 is 3120 Cd4 from 11/20/.23 is 524 ( not sure whether it is accurate) on 03/02/22 it was 85- repeated on 03/18/22 and it is 191 ( 13.9%)  Left side neck mass which is painful- lymphadenoathy In a patient with AIDS the D.D is long ? Mycobacterium avium ( with anemia/high Alk po4- could be a paradoxical reaction due to HAART) sent blood culture for Afb ?Acute bacteria infection Fungal ? TB ?? Lymphoma Underwent LN biopsy - opathology and cultures sent As  patient stable this can be followed as OP  Fever and leucocytosis on presentation with painful lymphadenoathy and CT showing necrotic LN makes acute infection likely Pansinuitis and left mastoid fluid leucocytosis resolved and no fever On Po augmentin X 1 week    Anemia  Resolved candidal esophagitis  Polysubstance use  HEPB infection - surface antigen, antibody both positive and DNA low level- could be an acute infection which is possibly clearing Will need to follow DNA  Treated syphilis   serofast 1:2- recent [facility]- neg-  VDRL neg as well   H/o cervical spine surgery- MRI  does not show any infection/discitis /  Discussed the management with the patient and his mom He will follow with Dr.Wallace at [facility]- I will inform him tomorrow and get a follow up appt

## 2022-03-21 NOTE — Procedures (Addendum)
Interventional Radiology Procedure Note  Procedure: US Guided Biopsy of left cervical lymph node  Complications: None  Estimated Blood Loss: < 10 mL  Findings: 18 G core biopsy of left cervical LN performed under US guidance.  9 core samples obtained and sent to Pathology and Micro. Samples sent for: surgical path, anaerobic/aerobic cx/GS, fungal cx, AFB cx/smear.  Jodi Marble. Fredia Sorrow, M.D Pager:  [phone]

## 2022-03-22 DIAGNOSIS — D6489 Other specified anemias: Secondary | ICD-10-CM

## 2022-03-22 DIAGNOSIS — R5081 Fever presenting with conditions classified elsewhere: Secondary | ICD-10-CM | POA: Diagnosis not present

## 2022-03-22 LAB — CBC
HCT: 26.2 % — ABNORMAL LOW (ref 39.0–52.0)
Hemoglobin: 8.3 g/dL — ABNORMAL LOW (ref 13.0–17.0)
MCH: 25.6 pg — ABNORMAL LOW (ref 26.0–34.0)
MCHC: 31.7 g/dL (ref 30.0–36.0)
MCV: 80.9 fL (ref 80.0–100.0)
Platelets: 584 10*3/uL — ABNORMAL HIGH (ref 150–400)
RBC: 3.24 MIL/uL — ABNORMAL LOW (ref 4.22–5.81)
RDW: 18.4 % — ABNORMAL HIGH (ref 11.5–15.5)
WBC: 12.7 10*3/uL — ABNORMAL HIGH (ref 4.0–10.5)
nRBC: 0 % (ref 0.0–0.2)

## 2022-03-22 LAB — MISC LABCORP TEST (SEND OUT): Labcorp test code: 832599

## 2022-03-22 MED ORDER — FOLIC ACID 1 MG PO TABS: 1.0000 mg | ORAL_TABLET | Freq: Every day | ORAL | 1 refills | Status: DC

## 2022-03-22 MED ORDER — AMOXICILLIN-POT CLAVULANATE 875-125 MG PO TABS: 1.0000 | ORAL_TABLET | Freq: Two times a day (BID) | ORAL | 0 refills | Status: AC

## 2022-03-22 MED ORDER — VITAMIN B-1 100 MG PO TABS: 100.0000 mg | ORAL_TABLET | Freq: Every day | ORAL | 1 refills | Status: DC

## 2022-03-22 NOTE — Plan of Care (Signed)

## 2022-03-22 NOTE — Care Management Important Message (Signed)
Important Message  Patient Details  Name: Michael Chandler MRN: [id number] Date of Birth: 1985-08-17   Medicare Important Message Given:  Yes  Patient is in an isolation room and I reviewed the Important Message from Medicare with him by phone. He is agreement with his discharge. I wished him a speedy recovery and thanked him for his time.    Olegario Messier [name] 03/22/2022, 12:25 PM

## 2022-03-22 NOTE — Discharge Summary (Signed)
Physician Discharge Summary  Michael Chandler ZOX:[id number] DOB: 1985-06-08 DOA: 03/15/2022  PCP: Georgann Housekeeper, MD  Admit date: 03/15/2022 Discharge date: 03/22/2022  Admitted From: Home Disposition:  Home  Discharge Condition:Stable CODE STATUS:FULL Diet recommendation: Regular  Brief/Interim Summary: Patient is a 36 year old male history of HIV with last CD4 count of 80, depression,schizophrenia, recent history of cervical laminectomy presented here with complaint of neck pain, cough, dizziness, increased swelling of the neck.  On presentation, he was lethargic, somnolent, febrile.  Lab work showed leukocytosis, hemoglobin of 9.6.  ENT was consulted for left neck pain.  Found to have tenderness on the left neck with lymphadenopathy.  CT neck showed moderate lymphadenopathy with no cellulitis or abscess, also showed pansinus opacification worse on the left..  Patient was admitted for the management of sepsis secondary to neck lymphadenitis, sinusitis.  ID also consulted. Underwent  left-sided lymph node biopsy by IR.  Biopsy/pathology report pending.  It will be followed an outpatient.  ID cleared for discharge today.  Following problems were addressed during his hospitalization:  Sepsis: Presented with fever, soft blood pressure, tachycardia, leukocytosis.  Patient is immunocompromised with history of HIV.  Started on broad-spectrum antibiotics. Was on zosyn,now changed to augmentin. Cultures,NGTD. ID was following, sepsis physiology has resolved. We will continue Augmentin for 5 more days.   Neck pain/lymphadenitis: ENT was consulted on admission for left neck pain.  Found to have tenderness on the left neck with lymphadenopathy.  CT neck showed extensive lymphadenopathy with no cellulitis or abscess, also showed pansinus opacification worse on the left.  MRI of the cervical spine confirms significant lymphadenopathy, no discitis or abscess. He has history of cervical laminectomy in  2021. ID recommending for IR guided lymph node biopsy for fungal culture, pathology.Results pending  AFB x2 have been negative,,3rd is pending.Quantiferon negative.   HIV: On Biktarvy .recent  lab works show Treponema pallidum antibodies, positive hepatitis B surface antigen also with antibody.  He says he has history of hepatitis B which was treated.  ID following   History of recent Candida esophagitis: Was given Diflucan as an outpatient not picked up yet. Treated here   History of MDD: Patient was hospitalized in November for depression, suicidal ideation.  Currently his mood is stable, remains alert and oriented   Mild elevation of ammonia:Resolved   Normocytic anemia: This is most likely secondary to HIV.  Currently hemoglobin stable in the range of 8.    Discharge Diagnoses:  Active Problems:   AIDS (acquired immune deficiency syndrome) (HCC)   Major depressive disorder, recurrent severe without psychotic features (HCC)   Candida esophagitis (HCC)   Syphilis   Lymphadenopathy   Pansinusitis   Sepsis (HCC)   Fever    Discharge Instructions  Discharge Instructions     Diet general   Complete by: As directed    Discharge instructions   Complete by: As directed    1)Please follow up with your PCP in a week.Your biopsy report will be followed up as an outpatient 2)Take prescribed medication as instructed 3)Follow up with infectious disease on December 7.   Increase activity slowly   Complete by: As directed    No wound care   Complete by: As directed       Allergies as of 03/22/2022       Reactions   Bactrim [sulfamethoxazole-trimethoprim] Anaphylaxis        Medication List     STOP taking these medications    benztropine 1 MG tablet Commonly  known as: COGENTIN   gabapentin 300 MG capsule Commonly known as: NEURONTIN   haloperidol 5 MG tablet Commonly known as: HALDOL       TAKE these medications    amoxicillin-clavulanate 875-125 MG  tablet Commonly known as: AUGMENTIN Take 1 tablet by mouth every 12 (twelve) hours for 5 days.   Biktarvy 50-200-25 MG Tabs tablet Generic drug: bictegravir-emtricitabine-tenofovir AF Take 1 tablet by mouth daily.   dapsone 100 MG tablet Take 1 tablet (100 mg total) by mouth daily.   folic acid 1 MG tablet Commonly known as: FOLVITE Take 1 tablet (1 mg total) by mouth daily.   mirtazapine 15 MG disintegrating tablet Commonly known as: REMERON SOL-TAB Take 1 tablet (15 mg total) by mouth at bedtime.   thiamine 100 MG tablet Commonly known as: Vitamin B-1 Take 1 tablet (100 mg total) by mouth daily. Start taking on: March 23, 2022   traZODone 150 MG tablet Commonly known as: DESYREL Take 1 tablet (150 mg total) by mouth at bedtime as needed for sleep.        Follow-up Information     Georgann Housekeeper, MD. Schedule an appointment as soon as possible for a visit in 1 week(s).   Specialty: Internal Medicine Contact information: [address] [place] [address] [phone]         Kathlynn Grate, DO Follow up.   Specialties: Infectious Diseases, Internal Medicine Why: You have an appointment on Dec 7 Contact information: [address] [place] [address] [phone]                Allergies  Allergen Reactions   Bactrim [Sulfamethoxazole-Trimethoprim] Anaphylaxis    Consultations: ID   Procedures/Studies: IR US Guide Bx Asp/Drain  Result Date: 03/21/2022 CLINICAL DATA:  Left-sided cervical lymphadenopathy. History of chronic HIV, hepatitis B and treated syphilis. EXAM: ULTRASOUND GUIDED CORE BIOPSY OF LEFT CERVICAL LYMPH NODE MEDICATIONS: Moderate (conscious) sedation was employed during this procedure. A total of Versed 1.0 mg and Fentanyl 50 mcg was administered intravenously by radiology nursing. Moderate Sedation Time: 10 minutes. The patient's level of consciousness and vital signs were monitored continuously by  radiology nursing throughout the procedure under my direct supervision. PROCEDURE: The procedure, risks, benefits, and alternatives were explained to the patient. Questions regarding the procedure were encouraged and answered. The patient understands and consents to the procedure. A time out was performed prior to initiating the procedure. The left neck was prepped with chlorhexidine in a sterile fashion, and a sterile drape was applied covering the operative field. A sterile gown and sterile gloves were used for the procedure. Local anesthesia was provided with 1% Lidocaine. After localizing an appropriate lymph node for biopsy, multiple 18 gauge core biopsy samples were obtained of an enlarged left cervical lymph node under direct ultrasound guidance. Core biopsy samples were submitted on saline soaked Telfa gauze for both pathologic and microbiologic analysis. Additional ultrasound was performed on completion. COMPLICATIONS: None. FINDINGS: The largest identifiable lymph node in the left neck measures up to approximately 5 cm in maximum length. Multiple samples were obtained of this lymph node. A total of 9 core biopsy samples were split for pathologic analysis as well as standard aerobic/anaerobic culture, fungal culture and AFB culture/smear. IMPRESSION: Ultrasound-guided core biopsy performed of an enlarged left cervical lymph node. Multiple core biopsy samples were sent for pathologic analysis as well as multiple culture studies. Electronically Signed   By: Irish Lack M.D.   On:  03/21/2022 12:45   MR THORACIC SPINE W WO CONTRAST  Result Date: 03/16/2022 CLINICAL DATA:  Immunocompromised with back pain and fever. EXAM: MRI CERVICAL, THORACIC AND LUMBAR SPINE WITHOUT AND WITH CONTRAST TECHNIQUE: Multiplanar and multiecho pulse sequences of the cervical spine, to include the craniocervical junction and cervicothoracic junction, and thoracic and lumbar spine, were obtained without and with intravenous  contrast. CONTRAST:  9mL GADAVIST GADOBUTROL 1 MMOL/ML IV SOLN COMPARISON:  Neck CT 03/15/2022 and prior MR examinations 09/29/2021. FINDINGS: MRI CERVICAL SPINE FINDINGS Limited examination due to patient motion. Alignment: Normal Vertebrae: Significant artifact associated with the anterior and posterior cervical fusion hardware. No acute bony findings or evidence of discitis or osteomyelitis. Cord: Grossly cord signal intensity. No cord lesions or syrinx. Posterior Fossa, vertebral arteries, paraspinal tissues: As demonstrated on the neck CT there is massive left-sided cervical lymphadenopathy. Disc levels: No cervical disc protrusions spinal or foraminal stenosis. Moderate thinning of the cervical cord at the level of the prior surgery which is stable. MRI THORACIC SPINE FINDINGS Alignment:  He normal Vertebrae: Normal marrow signal. No bone lesions or fractures. No findings for discitis or osteomyelitis. Cord: Normal cord signal intensity. No cord lesions, syrinx or abnormal cord enhancement. Paraspinal and other soft tissues: No significant findings. Disc levels: Small left paracentral disc protrusion at T5-6. Small central disc protrusion at T8-9. Right paracentral disc protrusion at T9-10. MRI LUMBAR SPINE FINDINGS Segmentation: There are five lumbar type vertebral bodies. The last full intervertebral disc space is labeled L5-S1. Alignment:  Normal Vertebrae: Normal marrow signal. No bone lesions or fractures. No findings suspicious for discitis or osteomyelitis. Conus medullaris and cauda equina: Conus extends to the L1 level. Conus and cauda equina appear normal. Paraspinal and other soft tissues: No significant paraspinal or retroperitoneal findings. Disc levels: L1-2: No significant findings. L2-3: Disc desiccation and disc space narrowing with a small to moderate-sized disc extrusion with mass effect on the ventral thecal sac. Some disc material is coursing up behind L2 and down behind L3. L3-4: No  significant findings. L4-5: Mild annular bulge but no spinal or foraminal stenosis. L5-S1 no significant findings. IMPRESSION: 1. As demonstrated on the neck CT examination there is massive left-sided cervical lymphadenopathy. 2. No findings for discitis or osteomyelitis in the cervical, thoracic or lumbar spines. 3. Small thoracic disc protrusions but no significant spinal or foraminal stenosis. 4. Small to moderate-sized disc extrusion at L2-3 with mass effect on the ventral thecal sac. Electronically Signed   By: Rudie Meyer M.D.   On: 03/16/2022 14:53   MR Lumbar Spine W Wo Contrast  Result Date: 03/16/2022 CLINICAL DATA:  Immunocompromised with back pain and fever. EXAM: MRI CERVICAL, THORACIC AND LUMBAR SPINE WITHOUT AND WITH CONTRAST TECHNIQUE: Multiplanar and multiecho pulse sequences of the cervical spine, to include the craniocervical junction and cervicothoracic junction, and thoracic and lumbar spine, were obtained without and with intravenous contrast. CONTRAST:  9mL GADAVIST GADOBUTROL 1 MMOL/ML IV SOLN COMPARISON:  Neck CT 03/15/2022 and prior MR examinations 09/29/2021. FINDINGS: MRI CERVICAL SPINE FINDINGS Limited examination due to patient motion. Alignment: Normal Vertebrae: Significant artifact associated with the anterior and posterior cervical fusion hardware. No acute bony findings or evidence of discitis or osteomyelitis. Cord: Grossly cord signal intensity. No cord lesions or syrinx. Posterior Fossa, vertebral arteries, paraspinal tissues: As demonstrated on the neck CT there is massive left-sided cervical lymphadenopathy. Disc levels: No cervical disc protrusions spinal or foraminal stenosis. Moderate thinning of the cervical cord at the level of  the prior surgery which is stable. MRI THORACIC SPINE FINDINGS Alignment:  He normal Vertebrae: Normal marrow signal. No bone lesions or fractures. No findings for discitis or osteomyelitis. Cord: Normal cord signal intensity. No cord  lesions, syrinx or abnormal cord enhancement. Paraspinal and other soft tissues: No significant findings. Disc levels: Small left paracentral disc protrusion at T5-6. Small central disc protrusion at T8-9. Right paracentral disc protrusion at T9-10. MRI LUMBAR SPINE FINDINGS Segmentation: There are five lumbar type vertebral bodies. The last full intervertebral disc space is labeled L5-S1. Alignment:  Normal Vertebrae: Normal marrow signal. No bone lesions or fractures. No findings suspicious for discitis or osteomyelitis. Conus medullaris and cauda equina: Conus extends to the L1 level. Conus and cauda equina appear normal. Paraspinal and other soft tissues: No significant paraspinal or retroperitoneal findings. Disc levels: L1-2: No significant findings. L2-3: Disc desiccation and disc space narrowing with a small to moderate-sized disc extrusion with mass effect on the ventral thecal sac. Some disc material is coursing up behind L2 and down behind L3. L3-4: No significant findings. L4-5: Mild annular bulge but no spinal or foraminal stenosis. L5-S1 no significant findings. IMPRESSION: 1. As demonstrated on the neck CT examination there is massive left-sided cervical lymphadenopathy. 2. No findings for discitis or osteomyelitis in the cervical, thoracic or lumbar spines. 3. Small thoracic disc protrusions but no significant spinal or foraminal stenosis. 4. Small to moderate-sized disc extrusion at L2-3 with mass effect on the ventral thecal sac. Electronically Signed   By: Rudie Meyer M.D.   On: 03/16/2022 14:53   MR CERVICAL SPINE W WO CONTRAST  Result Date: 03/16/2022 CLINICAL DATA:  Immunocompromised with back pain and fever. EXAM: MRI CERVICAL, THORACIC AND LUMBAR SPINE WITHOUT AND WITH CONTRAST TECHNIQUE: Multiplanar and multiecho pulse sequences of the cervical spine, to include the craniocervical junction and cervicothoracic junction, and thoracic and lumbar spine, were obtained without and with  intravenous contrast. CONTRAST:  9mL GADAVIST GADOBUTROL 1 MMOL/ML IV SOLN COMPARISON:  Neck CT 03/15/2022 and prior MR examinations 09/29/2021. FINDINGS: MRI CERVICAL SPINE FINDINGS Limited examination due to patient motion. Alignment: Normal Vertebrae: Significant artifact associated with the anterior and posterior cervical fusion hardware. No acute bony findings or evidence of discitis or osteomyelitis. Cord: Grossly cord signal intensity. No cord lesions or syrinx. Posterior Fossa, vertebral arteries, paraspinal tissues: As demonstrated on the neck CT there is massive left-sided cervical lymphadenopathy. Disc levels: No cervical disc protrusions spinal or foraminal stenosis. Moderate thinning of the cervical cord at the level of the prior surgery which is stable. MRI THORACIC SPINE FINDINGS Alignment:  He normal Vertebrae: Normal marrow signal. No bone lesions or fractures. No findings for discitis or osteomyelitis. Cord: Normal cord signal intensity. No cord lesions, syrinx or abnormal cord enhancement. Paraspinal and other soft tissues: No significant findings. Disc levels: Small left paracentral disc protrusion at T5-6. Small central disc protrusion at T8-9. Right paracentral disc protrusion at T9-10. MRI LUMBAR SPINE FINDINGS Segmentation: There are five lumbar type vertebral bodies. The last full intervertebral disc space is labeled L5-S1. Alignment:  Normal Vertebrae: Normal marrow signal. No bone lesions or fractures. No findings suspicious for discitis or osteomyelitis. Conus medullaris and cauda equina: Conus extends to the L1 level. Conus and cauda equina appear normal. Paraspinal and other soft tissues: No significant paraspinal or retroperitoneal findings. Disc levels: L1-2: No significant findings. L2-3: Disc desiccation and disc space narrowing with a small to moderate-sized disc extrusion with mass effect on the ventral thecal  sac. Some disc material is coursing up behind L2 and down behind L3.  L3-4: No significant findings. L4-5: Mild annular bulge but no spinal or foraminal stenosis. L5-S1 no significant findings. IMPRESSION: 1. As demonstrated on the neck CT examination there is massive left-sided cervical lymphadenopathy. 2. No findings for discitis or osteomyelitis in the cervical, thoracic or lumbar spines. 3. Small thoracic disc protrusions but no significant spinal or foraminal stenosis. 4. Small to moderate-sized disc extrusion at L2-3 with mass effect on the ventral thecal sac. Electronically Signed   By: Rudie Meyer M.D.   On: 03/16/2022 14:53   CT HEAD WO CONTRAST ( )  Result Date: 03/15/2022 CLINICAL DATA:  Altered mental status. EXAM: CT HEAD WITHOUT CONTRAST TECHNIQUE: Contiguous axial images were obtained from the base of the skull through the vertex without intravenous contrast. RADIATION DOSE REDUCTION: This exam was performed according to the departmental dose-optimization program which includes automated exposure control, adjustment of the mA and/or kV according to patient size and/or use of iterative reconstruction technique. COMPARISON:  Brain MRI dated 09/29/2021. FINDINGS: Brain: The ventricles and sulci are appropriate size for the patient's age. The gray-white matter discrimination is preserved. There is no acute intracranial hemorrhage. No mass effect or midline shift. No extra-axial fluid collection. Vascular: No hyperdense vessel or unexpected calcification. Skull: Normal. Negative for fracture or focal lesion. Sinuses/Orbits: Diffuse mucoperiosteal thickening of paranasal sinuses with opacification of several ethmoid air cells as well as opacification of the left frontal and sphenoid sinuses. There is left mastoid effusions. Other: None IMPRESSION: 1. No acute intracranial pathology. 2. Paranasal sinus disease. Left mastoid effusions. Electronically Signed   By: Elgie Collard M.D.   On: 03/15/2022 20:21   CT Soft Tissue Neck W Contrast  Addendum Date:  03/15/2022   ADDENDUM REPORT: 03/15/2022 20:08 ADDENDUM: There is a dictation error within the final line of the impression, which should read Emphysema (ICD10-J43.9) rather than Aortic Atherosclerosis (ICD10-I70.0). Electronically Signed   By: Jackey Loge D.O.   On: 03/15/2022 20:08   Result Date: 03/15/2022 CLINICAL DATA:  Provided history: Soft tissue infection suspected, neck, x-ray done. EXAM: CT NECK WITH CONTRAST TECHNIQUE: Multidetector CT imaging of the neck was performed using the standard protocol following the bolus administration of intravenous contrast. RADIATION DOSE REDUCTION: This exam was performed according to the departmental dose-optimization program which includes automated exposure control, adjustment of the mA and/or kV according to patient size and/or use of iterative reconstruction technique. CONTRAST:  5mL OMNIPAQUE IOHEXOL 300 MG/ML  SOLN COMPARISON:  None. FINDINGS: Pharynx and larynx: Streak and beam hardening artifact arising from dental restoration partially obscures the oral cavity. Within this limitation, there is no appreciable swelling or mass within the oral cavity, pharynx or larynx. No retropharyngeal collection. Salivary glands: The left submandibular gland appears anteriorly displaced by extensive conglomerate lymphadenopathy. The bilateral parotid and submandibular glands are otherwise unremarkable. Thyroid: Unremarkable. Lymph nodes: There is extensive lymphadenopathy throughout the left neck, much of which is conglomerate lymphadenopathy. Notably, an enlarged lymph node within the left lower neck has internal hypodensity, which may reflect suppuration or cystic/necrotic change (for instance as seen on series 6, image 74). A fat plane is poorly delineated between portions of this conglomerate lymphadenopathy and the left sternocleidomastoid muscle. A dominant conglomerate nodal mass measures 10.0 x 5.8 cm (for instance as seen on series 5, image 129). Vascular: The  caudal left internal jugular vein is effaced by lymphadenopathy. The major vascular structures of the neck otherwise appear  patent. Limited intracranial: No evidence of acute intracranial abnormality within the field of view. Visualized orbits: No orbital mass or acute orbital finding. Mastoids and visualized paranasal sinuses: Extensive partial opacification of the left frontal sinus due to the presence of mucosal thickening and fluid. Partial opacification of the left greater than right ethmoid air cells due to the presence of mucosal thickening and fluid. Near complete opacification of the left sphenoid sinus to the presence of mucosal thickening and fluid. Moderate to severe partial opacification of the bilateral maxillary sinuses due to the presence of mucosal thickening and fluid. Left mastoid effusion. Skeleton: ACDF hardware spanning the C4-C7 levels. Corpectomy cages are present at C5 and C6. A posterior spinal fusion construct spans the C3-T1 levels. Prior posterior decompression at the C3-C4 through C7-T1 levels. No acute fracture or aggressive osseous lesion. Upper chest: Centrilobular and paraseptal emphysema within the imaged lung apices. These results were called by telephone at the time of interpretation on 03/15/2022 at 5:36 pm to provider [facility] , who verbally acknowledged these results. IMPRESSION: Extensive lymphadenopathy throughout the left neck, as described. This lymphadenopathy may be due to an infectious etiology. Alternatively, this could reflect sequelae of lymphoma or nodal metastatic disease. Correlate clinically and consider direct tissue sampling. Lymphadenopathy effaces the caudal left internal jugular vein. Pansinusitis. Large left mastoid effusion. Postsurgical changes to the cervical spine, as described. Aortic Atherosclerosis (ICD10-I70.0). Electronically Signed: By: Jackey Loge D.O. On: 03/15/2022 17:38   DG Chest 2 View  Result Date: 03/15/2022 CLINICAL DATA:   Fever. Recent pneumonia. Neck pain after neck surgery in 2021. EXAM: CHEST - 2 VIEW COMPARISON:  03/01/2022 FINDINGS: Normal heart size and pulmonary vascularity. No focal airspace disease or consolidation in the lungs. No blunting of costophrenic angles. No pneumothorax. Mediastinal contours appear intact. Postoperative changes in the lower cervical spine. IMPRESSION: No active cardiopulmonary disease. Electronically Signed   By: Burman Nieves M.D.   On: 03/15/2022 17:28   DG Chest 2 View  Result Date: 03/01/2022 CLINICAL DATA:  Post pneumonia EXAM: CHEST - 2 VIEW COMPARISON:  10/03/2021 FINDINGS: Cardiac size is within normal limits. There are no signs of pulmonary edema or focal pulmonary consolidation. There is no pleural effusion or pneumothorax. There is surgical fusion in cervical spine. IMPRESSION: No active cardiopulmonary disease. Electronically Signed   By: Ernie Avena M.D.   On: 03/01/2022 12:46      Subjective: Patient seen and examined at bedside today.  Hemodynamically stable for discharge.  Discharge Exam: Vitals:   03/21/22 2150 03/22/22 0859  BP: 110/60 120/73  Pulse: (!) 102 100  Resp: 17   Temp: 99 F (37.2 C) 98.2 F (36.8 C)  SpO2: 100% 100%   Vitals:   03/21/22 1247 03/21/22 1703 03/21/22 2150 03/22/22 0859  BP: 115/85 124/72 110/60 120/73  Pulse: 81 (!) 109 (!) 102 100  Resp: 18 18 17    Temp: 97.6 F (36.4 C) 98.4 F (36.9 C) 99 F (37.2 C) 98.2 F (36.8 C)  TempSrc:      SpO2: 100% 100% 100% 100%  Weight:      Height:        General: Pt is alert, awake, not in acute distress Cardiovascular: RRR, S1/S2 +, no rubs, no gallops Respiratory: CTA bilaterally, no wheezing, no rhonchi Abdominal: Soft, NT, ND, bowel sounds + Extremities: no edema, no cyanosis    The results of significant diagnostics from this hospitalization (including imaging, microbiology, ancillary and laboratory) are listed below for reference.  Microbiology: Recent  Results (from the past 240 hour(s))  Resp Panel by RT-PCR (Flu A&B, Covid) Anterior Nasal Swab     Status: None   Collection Time: 03/15/22  3:48 PM   Specimen: Anterior Nasal Swab  Result Value Ref Range Status   SARS Coronavirus 2 by RT PCR NEGATIVE NEGATIVE Final    Comment: (NOTE) SARS-CoV-2 target nucleic acids are NOT DETECTED.  The SARS-CoV-2 RNA is generally detectable in upper respiratory specimens during the acute phase of infection. The lowest concentration of SARS-CoV-2 viral copies this assay can detect is 138 copies/mL. A negative result does not preclude SARS-Cov-2 infection and should not be used as the sole basis for treatment or other patient management decisions. A negative result may occur with  improper specimen collection/handling, submission of specimen other than nasopharyngeal swab, presence of viral mutation(s) within the areas targeted by this assay, and inadequate number of viral copies(<138 copies/mL). A negative result must be combined with clinical observations, patient history, and epidemiological information. The expected result is Negative.  Fact Sheet for Patients:  BloggerCourse.com  Fact Sheet for Healthcare Providers:  SeriousBroker.it  This test is no t yet approved or cleared by the [place] FDA and  has been authorized for detection and/or diagnosis of SARS-CoV-2 by FDA under an Emergency Use Authorization (EUA). This EUA will remain  in effect (meaning this test can be used) for the duration of the COVID-19 declaration under Section 564(b)(1) of the Act, 21 U.S.C.section 360bbb-3(b)(1), unless the authorization is terminated  or revoked sooner.       Influenza A by PCR NEGATIVE NEGATIVE Final   Influenza B by PCR NEGATIVE NEGATIVE Final    Comment: (NOTE) The Xpert Xpress SARS-CoV-2/FLU/RSV plus assay is intended as an aid in the diagnosis of influenza from Nasopharyngeal swab  specimens and should not be used as a sole basis for treatment. Nasal washings and aspirates are unacceptable for Xpert Xpress SARS-CoV-2/FLU/RSV testing.  Fact Sheet for Patients: BloggerCourse.com  Fact Sheet for Healthcare Providers: SeriousBroker.it  This test is not yet approved or cleared by the [place] FDA and has been authorized for detection and/or diagnosis of SARS-CoV-2 by FDA under an Emergency Use Authorization (EUA). This EUA will remain in effect (meaning this test can be used) for the duration of the COVID-19 declaration under Section 564(b)(1) of the Act, 21 U.S.C. section 360bbb-3(b)(1), unless the authorization is terminated or revoked.  Performed at [facility], [address] [place], [place] [address]   Culture, blood (routine x 2)     Status: None   Collection Time: 03/15/22  4:26 PM   Specimen: BLOOD  Result Value Ref Range Status   Specimen Description BLOOD BLOOD LEFT HAND  Final   Special Requests   Final    BOTTLES DRAWN AEROBIC AND ANAEROBIC Blood Culture adequate volume   Culture   Final    NO GROWTH 5 DAYS Performed at [facility], [address] [place], [place] [address]    Report Status 03/20/2022 FINAL  Final  Culture, blood (routine x 2)     Status: None   Collection Time: 03/15/22  4:31 PM   Specimen: BLOOD  Result Value Ref Range Status   Specimen Description BLOOD BLOOD RIGHT HAND  Final   Special Requests   Final    BOTTLES DRAWN AEROBIC AND ANAEROBIC Blood Culture results may not be optimal due to an excessive volume of blood received in culture bottles   Culture  Final    NO GROWTH 5 DAYS Performed at [facility], [address] [place], [place] [address]    Report Status 03/20/2022 FINAL  Final  Urine Culture     Status: Abnormal   Collection Time: 03/15/22  6:10 PM   Specimen: In/Out Cath Urine  Result Value Ref Range Status    Specimen Description   Final    IN/OUT CATH URINE Performed at [facility], [address], [place], [place] [address]    Special Requests   Final    NONE Performed at [facility], [address], [place], [place] [address]    Culture MULTIPLE SPECIES PRESENT, SUGGEST RECOLLECTION (A)  Final   Report Status 03/17/2022 FINAL  Final  MRSA culture     Status: None   Collection Time: 03/16/22 12:00 PM   Specimen: Nasal; Body Fluid  Result Value Ref Range Status   Specimen Description   Final    NASAL SWAB Performed at [facility], [address], [place], [place] [address]    Special Requests   Final    NONE Performed at [facility], [address] [place], [place] [address]    Culture RARE STAPHYLOCOCCUS AUREUS  Final   Report Status 03/20/2022 FINAL  Final   Organism ID, Bacteria STAPHYLOCOCCUS AUREUS  Final      Susceptibility   Staphylococcus aureus - MIC*    CIPROFLOXACIN <=0.5 SENSITIVE Sensitive     ERYTHROMYCIN >=8 RESISTANT Resistant     GENTAMICIN <=0.5 SENSITIVE Sensitive     OXACILLIN <=0.25 SENSITIVE Sensitive     TETRACYCLINE <=1 SENSITIVE Sensitive     VANCOMYCIN <=0.5 SENSITIVE Sensitive     TRIMETH/SULFA <=10 SENSITIVE Sensitive     CLINDAMYCIN RESISTANT Resistant     RIFAMPIN <=0.5 SENSITIVE Sensitive     Inducible Clindamycin POSITIVE Resistant     * RARE STAPHYLOCOCCUS AUREUS  MRSA Next Gen by PCR, Nasal     Status: None   Collection Time: 03/16/22 12:37 PM   Specimen: Nasal Mucosa; Nasal Swab  Result Value Ref Range Status   MRSA by PCR Next Gen NOT DETECTED NOT DETECTED Final    Comment: (NOTE) The GeneXpert MRSA Assay (FDA approved for NASAL specimens only), is one component of a comprehensive MRSA colonization surveillance program. It is not intended to diagnose MRSA infection nor to guide or monitor treatment for MRSA infections. Test performance is not FDA approved in patients less than 34  years old. Performed at [facility], [address] [place], [place] [address]   Acid Fast Smear (AFB)     Status: None   Collection Time: 03/16/22  8:40 PM   Specimen: Sputum  Result Value Ref Range Status   AFB Specimen Processing Concentration  Final   Acid Fast Smear Negative  Final    Comment: (NOTE) Performed At: [facility] [address] [place], [place] [id number] Jolene Schimke MD BM:[id number]    Source (AFB) SPUTUM  Final    Comment: Performed at [facility], [address] [place], [place] [address]  Acid Fast Smear (AFB)     Status: None   Collection Time: 03/18/22  7:30 PM   Specimen: Sputum  Result Value Ref Range Status   AFB Specimen Processing Concentration  Final   Acid Fast Smear Negative  Final    Comment: (NOTE) Performed At: [facility] [address] [place], [place] [id number] Jolene Schimke MD QV:[id number]    Source (  AFB) SPUTUM  Final    Comment: Performed at [facility], [address] [place], [place] [address]  Aerobic/Anaerobic Culture w Gram Stain (surgical/deep wound)     Status: None (Preliminary result)   Collection Time: 03/21/22 12:10 PM   Specimen: Neck; Tissue  Result Value Ref Range Status   Specimen Description   Final    NECK LEFT BX Performed at [facility], [address] [place], [place] [address]    Special Requests   Final    Immunocompromised Performed at [facility], [address] [place], [place] [address]    Gram Stain NO WBC SEEN NO ORGANISMS SEEN   Final   Culture   Final    NO GROWTH < 24 HOURS Performed at [facility], [address], [place], [place] [address]    Report Status PENDING  Incomplete     Labs: BNP (last 3 results) No results for input(s): "BNP" in the last 8760 hours. Basic Metabolic Panel: Recent Labs  Lab 03/16/22 0844 03/17/22 0357 03/18/22 0648 03/19/22 0402 03/20/22 0607  NA 130* 135 137 137  139  K 4.4 3.6 4.0 4.2 4.0  CL 103 107 111 108 109  CO2 21* 21* 20* 20* 22  GLUCOSE 92 110* 98 101* 96  BUN 12 10 10 9 10   CREATININE 0.95 0.89 0.88 0.98 0.81  CALCIUM 8.1* 8.5* 8.6* 8.7* 8.8*   Liver Function Tests: Recent Labs  Lab 03/15/22 1548 03/16/22 0844 03/17/22 0357 03/20/22 0607  AST 29 26 27  37  ALT 26 21 20 27   ALKPHOS 346* 297* 315* 450*  BILITOT 0.5 0.5 0.5 0.4  PROT 8.3* 7.3 7.2 7.4  ALBUMIN 2.8* 2.5* 2.3* 2.4*   No results for input(s): "LIPASE", "AMYLASE" in the last 168 hours. Recent Labs  Lab 03/16/22 0036 03/17/22 0357  AMMONIA 39* 27   CBC: Recent Labs  Lab 03/15/22 1548 03/16/22 0036 03/18/22 0648 03/19/22 0402 03/20/22 0607 03/22/22 0407  WBC 19.9*   < > 6.1  6.1 6.3 6.2 12.7*  NEUTROABS 13.2*  --  3.2  --   --   --   HGB 8.8*   < > 7.9*  7.9* 8.5* 8.0* 8.3*  HCT 27.8*   < > 24.6*  24.5* 26.8* 25.2* 26.2*  MCV 80.8   < > 79.9*  80 80.5 80.3 80.9  PLT 455*   < > 454*  466* 532* 553* 584*   < > = values in this interval not displayed.   Cardiac Enzymes: No results for input(s): "CKTOTAL", "CKMB", "CKMBINDEX", "TROPONINI" in the last 168 hours. BNP: Invalid input(s): "POCBNP" CBG: No results for input(s): "GLUCAP" in the last 168 hours. D-Dimer No results for input(s): "DDIMER" in the last 72 hours. Hgb A1c No results for input(s): "HGBA1C" in the last 72 hours. Lipid Profile No results for input(s): "CHOL", "HDL", "LDLCALC", "TRIG", "CHOLHDL", "LDLDIRECT" in the last 72 hours. Thyroid function studies No results for input(s): "TSH", "T4TOTAL", "T3FREE", "THYROIDAB" in the last 72 hours.  Invalid input(s): "FREET3" Anemia work up No results for input(s): "VITAMINB12", "FOLATE", "FERRITIN", "TIBC", "IRON", "RETICCTPCT" in the last 72 hours. Urinalysis    Component Value Date/Time   COLORURINE YELLOW (A) 03/15/2022 1810   APPEARANCEUR CLEAR (A) 03/15/2022 1810   LABSPEC >1.046 (H) 03/15/2022 1810   PHURINE 6.0 03/15/2022  1810   GLUCOSEU NEGATIVE 03/15/2022 1810   HGBUR NEGATIVE 03/15/2022 1810   BILIRUBINUR NEGATIVE 03/15/2022 1810   KETONESUR  NEGATIVE 03/15/2022 1810   PROTEINUR 100 (A) 03/15/2022 1810   NITRITE NEGATIVE 03/15/2022 1810   LEUKOCYTESUR NEGATIVE 03/15/2022 1810   Sepsis Labs Recent Labs  Lab 03/18/22 0648 03/19/22 0402 03/20/22 0607 03/22/22 0407  WBC 6.1  6.1 6.3 6.2 12.7*   Microbiology Recent Results (from the past 240 hour(s))  Resp Panel by RT-PCR (Flu A&B, Covid) Anterior Nasal Swab     Status: None   Collection Time: 03/15/22  3:48 PM   Specimen: Anterior Nasal Swab  Result Value Ref Range Status   SARS Coronavirus 2 by RT PCR NEGATIVE NEGATIVE Final    Comment: (NOTE) SARS-CoV-2 target nucleic acids are NOT DETECTED.  The SARS-CoV-2 RNA is generally detectable in upper respiratory specimens during the acute phase of infection. The lowest concentration of SARS-CoV-2 viral copies this assay can detect is 138 copies/mL. A negative result does not preclude SARS-Cov-2 infection and should not be used as the sole basis for treatment or other patient management decisions. A negative result may occur with  improper specimen collection/handling, submission of specimen other than nasopharyngeal swab, presence of viral mutation(s) within the areas targeted by this assay, and inadequate number of viral copies(<138 copies/mL). A negative result must be combined with clinical observations, patient history, and epidemiological information. The expected result is Negative.  Fact Sheet for Patients:  BloggerCourse.com  Fact Sheet for Healthcare Providers:  SeriousBroker.it  This test is no t yet approved or cleared by the [place] FDA and  has been authorized for detection and/or diagnosis of SARS-CoV-2 by FDA under an Emergency Use Authorization (EUA). This EUA will remain  in effect (meaning this test can be used) for  the duration of the COVID-19 declaration under Section 564(b)(1) of the Act, 21 U.S.C.section 360bbb-3(b)(1), unless the authorization is terminated  or revoked sooner.       Influenza A by PCR NEGATIVE NEGATIVE Final   Influenza B by PCR NEGATIVE NEGATIVE Final    Comment: (NOTE) The Xpert Xpress SARS-CoV-2/FLU/RSV plus assay is intended as an aid in the diagnosis of influenza from Nasopharyngeal swab specimens and should not be used as a sole basis for treatment. Nasal washings and aspirates are unacceptable for Xpert Xpress SARS-CoV-2/FLU/RSV testing.  Fact Sheet for Patients: BloggerCourse.com  Fact Sheet for Healthcare Providers: SeriousBroker.it  This test is not yet approved or cleared by the [place] FDA and has been authorized for detection and/or diagnosis of SARS-CoV-2 by FDA under an Emergency Use Authorization (EUA). This EUA will remain in effect (meaning this test can be used) for the duration of the COVID-19 declaration under Section 564(b)(1) of the Act, 21 U.S.C. section 360bbb-3(b)(1), unless the authorization is terminated or revoked.  Performed at [facility], [address] [place], [place] [address]   Culture, blood (routine x 2)     Status: None   Collection Time: 03/15/22  4:26 PM   Specimen: BLOOD  Result Value Ref Range Status   Specimen Description BLOOD BLOOD LEFT HAND  Final   Special Requests   Final    BOTTLES DRAWN AEROBIC AND ANAEROBIC Blood Culture adequate volume   Culture   Final    NO GROWTH 5 DAYS Performed at [facility], [address], [place], [place] [address]    Report Status 03/20/2022 FINAL  Final  Culture, blood (routine x 2)     Status: None   Collection Time: 03/15/22  4:31 PM   Specimen: BLOOD  Result Value Ref Range Status  Specimen Description BLOOD BLOOD RIGHT HAND  Final   Special Requests   Final    BOTTLES DRAWN AEROBIC AND ANAEROBIC  Blood Culture results may not be optimal due to an excessive volume of blood received in culture bottles   Culture   Final    NO GROWTH 5 DAYS Performed at [facility], [address] [place], [place] [address]    Report Status 03/20/2022 FINAL  Final  Urine Culture     Status: Abnormal   Collection Time: 03/15/22  6:10 PM   Specimen: In/Out Cath Urine  Result Value Ref Range Status   Specimen Description   Final    IN/OUT CATH URINE Performed at [facility], [address] [place], [place] [address]    Special Requests   Final    NONE Performed at [facility], [address], [place], [place] [address]    Culture MULTIPLE SPECIES PRESENT, SUGGEST RECOLLECTION (A)  Final   Report Status 03/17/2022 FINAL  Final  MRSA culture     Status: None   Collection Time: 03/16/22 12:00 PM   Specimen: Nasal; Body Fluid  Result Value Ref Range Status   Specimen Description   Final    NASAL SWAB Performed at [facility], [address] [place], [place] [address]    Special Requests   Final    NONE Performed at [facility], [address] [place], [place] [address]    Culture RARE STAPHYLOCOCCUS AUREUS  Final   Report Status 03/20/2022 FINAL  Final   Organism ID, Bacteria STAPHYLOCOCCUS AUREUS  Final      Susceptibility   Staphylococcus aureus - MIC*    CIPROFLOXACIN <=0.5 SENSITIVE Sensitive     ERYTHROMYCIN >=8 RESISTANT Resistant     GENTAMICIN <=0.5 SENSITIVE Sensitive     OXACILLIN <=0.25 SENSITIVE Sensitive     TETRACYCLINE <=1 SENSITIVE Sensitive     VANCOMYCIN <=0.5 SENSITIVE Sensitive     TRIMETH/SULFA <=10 SENSITIVE Sensitive     CLINDAMYCIN RESISTANT Resistant     RIFAMPIN <=0.5 SENSITIVE Sensitive     Inducible Clindamycin POSITIVE Resistant     * RARE STAPHYLOCOCCUS AUREUS  MRSA Next Gen by PCR, Nasal     Status: None   Collection Time: 03/16/22 12:37 PM   Specimen: Nasal Mucosa; Nasal Swab  Result Value Ref  Range Status   MRSA by PCR Next Gen NOT DETECTED NOT DETECTED Final    Comment: (NOTE) The GeneXpert MRSA Assay (FDA approved for NASAL specimens only), is one component of a comprehensive MRSA colonization surveillance program. It is not intended to diagnose MRSA infection nor to guide or monitor treatment for MRSA infections. Test performance is not FDA approved in patients less than 47 years old. Performed at [facility], [address] [place], [place] [address]   Acid Fast Smear (AFB)     Status: None   Collection Time: 03/16/22  8:40 PM   Specimen: Sputum  Result Value Ref Range Status   AFB Specimen Processing Concentration  Final   Acid Fast Smear Negative  Final    Comment: (NOTE) Performed At: [facility] [address] [place], [place] [id number] Jolene Schimke MD BM:[id number]    Source (AFB) SPUTUM  Final    Comment: Performed at [facility], [address] [place], [place] [address]  Acid Fast Smear (AFB)     Status: None   Collection Time: 03/18/22  7:30 PM   Specimen: Sputum  Result Value Ref Range Status   AFB Specimen Processing Concentration  Final   Acid Fast Smear Negative  Final    Comment: (NOTE) Performed At: [facility] [address] [place], [place] [id number] Jolene SchimkeNagendra Sanjai MD FA:[id number]    Source (AFB) SPUTUM  Final    Comment: Performed at [facility], [address] [place] [id number]  Aerobic/Anaerobic Culture w Gram Stain (surgical/deep wound)     Status: None (Preliminary result)   Collection Time: 03/21/22 12:10 PM   Specimen: Neck; Tissue  Result Value Ref Range Status   Specimen Description   Final    NECK LEFT BX Performed at [facility], [address] [place], [place] [address]    Special Requests   Final    Immunocompromised Performed at [facility], [address] [place], [place] [address]    Gram Stain NO WBC SEEN NO ORGANISMS SEEN    Final   Culture   Final    NO GROWTH < 24 HOURS Performed at [facility], [address] [place], [place] [address]    Report Status PENDING  Incomplete    Please note: You were cared for by a hospitalist during your hospital stay. Once you are discharged, your primary care physician will handle any further medical issues. Please note that NO REFILLS for any discharge medications will be authorized once you are discharged, as it is imperative that you return to your primary care physician (or establish a relationship with a primary care physician if you do not have one) for your post hospital discharge needs so that they can reassess your need for medications and monitor your lab values.    Time coordinating discharge: 40 minutes  SIGNED:   Burnadette PopAmrit Johnathyn Viscomi, MD  [facility] 03/22/2022, 12:08 PM Pager [phone]  If 7PM-7AM, please contact night-coverage [email or web address] Password [email or web address]

## 2022-03-23 ENCOUNTER — Other Ambulatory Visit (HOSPITAL_COMMUNITY): Payer: Self-pay

## 2022-03-23 ENCOUNTER — Encounter: Payer: Self-pay | Admitting: Infectious Diseases

## 2022-03-23 LAB — COMPLETE METABOLIC PANEL WITH GFR
AG Ratio: 0.9 (calc) — ABNORMAL LOW (ref 1.0–2.5)
ALT: 28 U/L (ref 9–46)
AST: 29 U/L (ref 10–40)
Albumin: 3.6 g/dL (ref 3.6–5.1)
Alkaline phosphatase (APISO): 380 U/L — ABNORMAL HIGH (ref 36–130)
BUN: 13 mg/dL (ref 7–25)
CO2: 27 mmol/L (ref 20–32)
Calcium: 9 mg/dL (ref 8.6–10.3)
Chloride: 96 mmol/L — ABNORMAL LOW (ref 98–110)
Creat: 1 mg/dL (ref 0.60–1.26)
Globulin: 4.2 g/dL (calc) — ABNORMAL HIGH (ref 1.9–3.7)
Glucose, Bld: 85 mg/dL (ref 65–99)
Potassium: 4.1 mmol/L (ref 3.5–5.3)
Sodium: 133 mmol/L — ABNORMAL LOW (ref 135–146)
Total Bilirubin: 0.4 mg/dL (ref 0.2–1.2)
Total Protein: 7.8 g/dL (ref 6.1–8.1)
eGFR: 101 mL/min/{1.73_m2} (ref >=60)

## 2022-03-23 LAB — LIPID PANEL
Cholesterol: 164 mg/dL (ref <200)
HDL: 45 mg/dL (ref >=40)
LDL Cholesterol (Calc): 92 mg/dL (calc)
Non-HDL Cholesterol (Calc): 119 mg/dL (calc) (ref <130)
Total CHOL/HDL Ratio: 3.6 (calc) (ref <5.0)
Triglycerides: 172 mg/dL — ABNORMAL HIGH (ref <150)

## 2022-03-23 LAB — CBC WITH DIFFERENTIAL/PLATELET
Absolute Monocytes: 3454 cells/uL — ABNORMAL HIGH (ref 200–950)
Basophils Absolute: 44 cells/uL (ref 0–200)
Basophils Relative: 0.2 %
Eosinophils Absolute: 286 cells/uL (ref 15–500)
Eosinophils Relative: 1.3 %
HCT: 29.3 % — ABNORMAL LOW (ref 38.5–50.0)
Hemoglobin: 9.6 g/dL — ABNORMAL LOW (ref 13.2–17.1)
Lymphs Abs: 4334 cells/uL — ABNORMAL HIGH (ref 850–3900)
MCH: 26.6 pg — ABNORMAL LOW (ref 27.0–33.0)
MCHC: 32.8 g/dL (ref 32.0–36.0)
MCV: 81.2 fL (ref 80.0–100.0)
MPV: 9.5 fL (ref 7.5–12.5)
Monocytes Relative: 15.7 %
Neutro Abs: 13882 cells/uL — ABNORMAL HIGH (ref 1500–7800)
Neutrophils Relative %: 63.1 %
Platelets: 562 10*3/uL — ABNORMAL HIGH (ref 140–400)
RBC: 3.61 10*6/uL — ABNORMAL LOW (ref 4.20–5.80)
RDW: 15.9 % — ABNORMAL HIGH (ref 11.0–15.0)
Total Lymphocyte: 19.7 %
WBC: 22 10*3/uL — ABNORMAL HIGH (ref 3.8–10.8)

## 2022-03-23 LAB — URINALYSIS
Bilirubin Urine: NEGATIVE
Glucose, UA: NEGATIVE
Hgb urine dipstick: NEGATIVE
Ketones, ur: NEGATIVE
Leukocytes,Ua: NEGATIVE
Nitrite: NEGATIVE
Specific Gravity, Urine: 1.016 (ref 1.001–1.035)
pH: 7 (ref 5.0–8.0)

## 2022-03-23 LAB — HIV-1 RNA QUANT-NO REFLEX-BLD
HIV 1 RNA Quant: 3120 Copies/mL — ABNORMAL HIGH
HIV-1 RNA Quant, Log: 3.49 Log cps/mL — ABNORMAL HIGH

## 2022-03-23 LAB — RPR TITER: RPR Titer: 1:2 {titer} — ABNORMAL HIGH

## 2022-03-23 LAB — HIV-1 GENOTYPING (RTI,PI,IN INHBTR)
Date Viral Load Collected: 11202023
HIV-1 Genotype: DETECTED — AB

## 2022-03-23 LAB — HEPATITIS B SURFACE ANTIGEN
Confirmation: REACTIVE — AB
Hepatitis B Surface Ag: REACTIVE — AB

## 2022-03-23 LAB — QUANTIFERON-TB GOLD PLUS
Mitogen-NIL: 3.21 IU/mL
NIL: 0.26 IU/mL
QuantiFERON-TB Gold Plus: NEGATIVE
TB1-NIL: <0 IU/mL
TB2-NIL: <0 IU/mL

## 2022-03-23 LAB — ACID FAST SMEAR (AFB, MYCOBACTERIA): Acid Fast Smear: NEGATIVE

## 2022-03-23 LAB — BARTONELLA ANTIBODY PANEL
B Quintana IgM: NEGATIVE titer
B henselae IgG: NEGATIVE titer
B henselae IgM: NEGATIVE titer
B quintana IgG: NEGATIVE titer

## 2022-03-23 LAB — HEPATITIS B SURFACE ANTIBODY,QUALITATIVE: Hep B S Ab: REACTIVE — AB

## 2022-03-23 LAB — HEPATITIS B DNA, ULTRAQUANTITATIVE, PCR
Hepatitis B DNA: NOT DETECTED IU/mL
Hepatitis B virus DNA: NOT DETECTED Log IU/mL

## 2022-03-23 LAB — SURGICAL PATHOLOGY

## 2022-03-23 LAB — RPR: RPR Ser Ql: REACTIVE — AB

## 2022-03-23 LAB — GLUCOSE 6 PHOSPHATE DEHYDROGENASE: G-6PDH: 18.6 U/g Hgb (ref 7.0–20.5)

## 2022-03-23 LAB — HEPATITIS C ANTIBODY: Hepatitis C Ab: NONREACTIVE

## 2022-03-23 LAB — HEPATITIS A ANTIBODY, TOTAL: Hepatitis A AB,Total: REACTIVE — AB

## 2022-03-23 LAB — HEPATITIS B CORE ANTIBODY, TOTAL: Hep B Core Total Ab: NONREACTIVE

## 2022-03-23 LAB — FLUORESCENT TREPONEMAL AB(FTA)-IGG-BLD: Fluorescent Treponemal ABS: REACTIVE — AB

## 2022-03-24 ENCOUNTER — Ambulatory Visit (HOSPITAL_COMMUNITY): Payer: No Typology Code available for payment source

## 2022-03-24 LAB — ACID FAST SMEAR (AFB, MYCOBACTERIA): Acid Fast Smear: NEGATIVE

## 2022-03-26 LAB — AEROBIC/ANAEROBIC CULTURE W GRAM STAIN (SURGICAL/DEEP WOUND)
Culture: NO GROWTH
Gram Stain: NONE SEEN

## 2022-03-30 ENCOUNTER — Ambulatory Visit: Payer: No Typology Code available for payment source | Admitting: Nurse Practitioner

## 2022-03-30 ENCOUNTER — Other Ambulatory Visit: Payer: Self-pay

## 2022-03-30 ENCOUNTER — Ambulatory Visit: Payer: No Typology Code available for payment source | Admitting: Internal Medicine

## 2022-03-30 NOTE — Progress Notes (Deleted)
[facility]   CHIEF COMPLAINT    HIV follow up.    SUBJECTIVE:    Michael Chandler is a 36 y.o. male with PMHx as below who presents to the clinic for HIV follow up.   Please see A&P for the details of today's visit and status of the patient's medical problems.   Patient's Medications  New Prescriptions   No medications on file  Previous Medications   BIKTARVY 50-200-25 MG TABS TABLET    Take 1 tablet by mouth daily.   DAPSONE 100 MG TABLET    Take 1 tablet (100 mg total) by mouth daily.   FOLIC ACID (FOLVITE) 1 MG TABLET    Take 1 tablet (1 mg total) by mouth daily.   MIRTAZAPINE (REMERON SOL-TAB) 15 MG DISINTEGRATING TABLET    Take 1 tablet (15 mg total) by mouth at bedtime.   THIAMINE (VITAMIN B-1) 100 MG TABLET    Take 1 tablet (100 mg total) by mouth daily.   TRAZODONE (DESYREL) 150 MG TABLET    Take 1 tablet (150 mg total) by mouth at bedtime as needed for sleep.  Modified Medications   No medications on file  Discontinued Medications   No medications on file      Past Medical History:  Diagnosis Date   HIV (human immunodeficiency virus infection) ([place])    Schizophrenia ([place])    per IVC paperwork, pt states does not have this dx    Social History   Tobacco Use   Smoking status: Former    Types: Cigarettes   Smokeless tobacco: Never   Tobacco comments:    Patient denies using tobacco products (03/13/22)  Substance Use Topics   Alcohol use: Yes    Comment: occasional   Drug use: Yes    Types: Methamphetamines, Marijuana, Cocaine    Comment: states occasional marijuana and meth use    No family history on file.  Allergies  Allergen Reactions   Bactrim [Sulfamethoxazole-Trimethoprim] Anaphylaxis    ROS   OBJECTIVE:    There were no vitals filed for this visit.   There is no height or weight on file to calculate BMI.  Physical Exam  Labs and Microbiology:    Latest Ref Rng & Units 03/20/2022    6:07 AM  03/19/2022    4:02 AM 03/18/2022    6:48 AM  CMP  Glucose 70 - 99 mg/dL 96  101  98   BUN 6 - 20 mg/dL _0 Creatinine 0.61 - 1.24 mg/dL 0.81  0.98  0.88   Sodium 135 - 145 mmol/L 139  137  137   Potassium 3.5 - 5.1 mmol/L 4.0  4.2  4.0   Chloride 98 - 111 mmol/L 109  108  111   CO2 22 - 32 mmol/L _1 Calcium 8.9 - 10.3 mg/dL 8.8  8.7  8.6   Total Protein 6.5 - 8.1 g/dL 7.4     Total Bilirubin 0.3 - 1.2 mg/dL 0.4     Alkaline Phos 38 - 126 U/L 450     AST 15 - 41 U/L 37     ALT 0 - 44 U/L 27         Latest Ref Rng & Units 03/22/2022    4:07 AM 03/20/2022    6:07 AM 03/19/2022    4:02 AM  CBC  WBC 4.0 - 10.5 K/uL  12.7  6.2  6.3   Hemoglobin 13.0 - 17.0 g/dL 8.3  8.0  8.5   Hematocrit 39.0 - 52.0 % 26.2  25.2  26.8   Platelets 150 - 400 K/uL 584  553  532      Lab Results  Component Value Date   HIV1RNAQUANT 3,120 (H) 03/13/2022   HIV1RNAQUANT 174,000 12/18/2021   CD4TABS 524 03/13/2022    RPR and STI: Lab Results  Component Value Date   LABRPR REACTIVE (A) 03/13/2022   LABRPR Reactive (A) 03/02/2022   LABRPR Reactive (A) 12/20/2021   LABRPR Reactive (A) 09/29/2021   RPRTITER 1:2 (H) 03/13/2022    STI Results GC CT  03/13/2022 10:36 AM Negative  Negative     Hepatitis B: Lab Results  Component Value Date   HEPBSAB REACTIVE (A) 03/13/2022   HEPBSAG REACTIVE (A) 03/13/2022   HEPBCAB NON-REACTIVE 03/13/2022   Hepatitis C: Lab Results  Component Value Date   HEPCAB NON-REACTIVE 03/13/2022   Hepatitis A: Lab Results  Component Value Date   HAV REACTIVE (A) 03/13/2022   Lipids: Lab Results  Component Value Date   CHOL 164 03/13/2022   TRIG 172 (H) 03/13/2022   HDL 45 03/13/2022   CHOLHDL 3.6 03/13/2022   VLDL 50 (H) 03/04/2022   [employer] 92 03/13/2022      ASSESSMENT & PLAN:    No problem-specific Assessment & Plan notes found for this encounter.   No orders of the defined types were placed in this encounter.       *** Vaccines Influenza: give every year COVID: recommend vaccination if not already done Prevnar 20: Give x 1 if no prior pneumonia vaccine.    - If only 1 dose of either PPSV 23 OR PCV 13 ----> give PCV 20 if > 1 year since last vaccine  - If received both PPSV 23 AND PCV 13 ----> give PCV 20 if > 5 years since last vaccine.  If < 5 years then wait to give PCV 20 Pneumovax-23: (if CD4 >200) give twice every 5 years apart before age 70, then once at age 5.  Give >8 weeks from [place] Prevnar-13: (preferably when CD4 >200) give once, give >1 year from last Pneumovax-23 Hepatitis A: give Havrix 2 dose series at 0 and 6-12 months if non-immune Hepatitis B: give Heplisav 2 dose series at 0 and 4 weeks if non-immune.  Repeat serology 2 months after vaccine and revaccinate if needed MenACWY: 2 dose primary series 8 weeks apart, then 1 dose booster every 5 years HPV: Gardasil-9 at 0, 2, and 6 months for ages 9-26 should be vaccinated.  Ages 55-45 should be offered if appropriate Tdap: give every 10 years Shingles: give Shingrix 2 dose series at 0 and 2-6 months if >50 years on ART with CD4 cell count >200 Varicella: primary vaccination may be considered in VZV seronegative persons aged >8 years (if CD4 >200)  MMR: vaccine should be given if born in [birth year] or after and do not have immunity (if CD4 >200)  Screening DEXA Scan: if age >7 Quantiferon: check at initiation of care Hepatitis C: check at initiation of care.  Screen annually if risk factors HLA B5701: check at initiation of care G6PD: check if starting therapy with oxidant drugs Lipids: check annually Urinalysis: check annually or every 6 months if on tenofovir Hgb A1c: check annually  ASCVD Risk Score Consider high-intensity statin therapy if 10-year ASCVD risk score >7.5% The ASCVD Risk score (Arnett DK, et  al., 2019) failed to calculate for the following reasons:   The 2019 ASCVD risk score is only valid for ages 78 to  [facility] [place] 03/30/2022, 7:17 AM  HIV: Patient will continue Biktarvy daily and discussed importance of adherence with patient.  Repeat viral load today and follow up in 1 month.  Need for PCP PPx: Continue dapsone for PCP prophylaxis.    Syphilis: Serofast at 1:2.  Chronic HBV: Treated with Biktarvy.  Recent HBV DNA PCR was undetectable and LFTs normal.  Discitis: Hx of C5-C6 osteodiscitis and epidural abscess s/p surgical decompression November 2021 per the notes in Care Everywhere.  He reports this was done in [place].  He reports having an abscess that was decompressed with hardware placement but unable to provide further details.  MRI 03/16/22 with no evidence of acute issues.  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  Status post IR guided LN biopsy with acid fast bacilli noted on pathology.  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  He is on ART and this will be continued.  Will add Azithromycin 582m PO daily and Ethambutol 128mkg PO daily.  Anticipate 12 months of therapy.  Will not add 3rd agent for now given he is on ART with improved viral load.

## 2022-03-30 NOTE — Progress Notes (Deleted)
[facility] MD/PA/NP Initial Note  03/30/2022 8:51 AM Michael Chandler  MRN:  [id number]  Chief Complaint:   HPI: Michael Chandler, 36 yo male, presents for _____. He was seen in the ED for suicidal ideation on 02/28/22. Currently, he   Visit Diagnosis: No diagnosis found.  Past Psychiatric History:   Past Medical History:  Past Medical History:  Diagnosis Date   HIV (human immunodeficiency virus infection) ([facility])    Schizophrenia ([facility])    per IVC paperwork, pt states does not have this dx    Past Surgical History:  Procedure Laterality Date   IR US GUIDE BX ASP/DRAIN  03/21/2022    Family Psychiatric History: ***  Family History: No family history on file.  Social History:  Social History   Socioeconomic History   Marital status: Single    Spouse name: Not on file   Number of children: Not on file   Years of education: Not on file   Highest education level: Not on file  Occupational History   Not on file  Tobacco Use   Smoking status: Former    Types: Cigarettes   Smokeless tobacco: Never   Tobacco comments:    Patient denies using tobacco products (03/13/22)  Substance and Sexual Activity   Alcohol use: Yes    Comment: occasional   Drug use: Yes    Types: Methamphetamines, Marijuana, Cocaine    Comment: states occasional marijuana and meth use   Sexual activity: Not Currently    Comment: condoms accepted  Other Topics Concern   Not on file  Social History Narrative   Not on file   Social Determinants of Health   Financial Resource Strain: Not on file  Food Insecurity: No Food Insecurity (03/16/2022)   Hunger Vital Sign    Worried About Running Out of Food in the Last Year: Never true    Ran Out of Food in the Last Year: Never true  Recent Concern: Food Insecurity - Food Insecurity Present (03/02/2022)   Hunger Vital Sign    Worried About Running Out of Food in the Last Year: Not on file    Ran Out of Food in the Last Year: Sometimes true  Transportation Needs: No  Transportation Needs (03/16/2022)   PRAPARE - Administrator, Civil Service (Medical): No    Lack of Transportation (Non-Medical): No  Physical Activity: Not on file  Stress: Not on file  Social Connections: Not on file    Allergies:  Allergies  Allergen Reactions   Bactrim [Sulfamethoxazole-Trimethoprim] Anaphylaxis    Metabolic Disorder Labs: Lab Results  Component Value Date   HGBA1C 5.7 (H) 03/04/2022   MPG 116.89 03/04/2022   MPG 131.24 12/18/2021   No results found for: "PROLACTIN" Lab Results  Component Value Date   CHOL 164 03/13/2022   TRIG 172 (H) 03/13/2022   HDL 45 03/13/2022   CHOLHDL 3.6 03/13/2022   VLDL 50 (H) 03/04/2022   LDLCALC 92 03/13/2022   LDLCALC 89 03/04/2022   Lab Results  Component Value Date   TSH 0.659 03/04/2022    Therapeutic Level Labs: No results found for: "LITHIUM" No results found for: "VALPROATE" No results found for: "CBMZ"  Current Medications: Current Outpatient Medications  Medication Sig Dispense Refill   BIKTARVY 50-200-25 MG TABS tablet Take 1 tablet by mouth daily. 30 tablet 5   dapsone 100 MG tablet Take 1 tablet (100 mg total) by mouth daily. 30 tablet 5   folic acid (FOLVITE) 1 MG tablet Take  1 tablet (1 mg total) by mouth daily. 30 tablet 1   mirtazapine (REMERON SOL-TAB) 15 MG disintegrating tablet Take 1 tablet (15 mg total) by mouth at bedtime. 7 tablet 0   thiamine (VITAMIN B-1) 100 MG tablet Take 1 tablet (100 mg total) by mouth daily. 30 tablet 1   traZODone (DESYREL) 150 MG tablet Take 1 tablet (150 mg total) by mouth at bedtime as needed for sleep. (Patient not taking: Reported on 02/28/2022) 60 tablet 1   No current facility-administered medications for this visit.    Medication Side Effects: {Medication Side Effects (Optional):12147}  Orders placed this visit:  No orders of the defined types were placed in this encounter.   Psychiatric Specialty Exam:  Review of Systems  There were no  vitals taken for this visit.There is no height or weight on file to calculate BMI.  General Appearance: {PSY:[id number]}  Eye Contact:  {PSY:22684}  Speech:  {PSY:[phone]}  Volume:  {PSY:22686}  Mood:  {PSY:22306}  Affect:  {PSY:513-031-5481}  Thought Process:  {PSY:22688}  Orientation:  {PSY:22689}  Thought Content: {PSY:22690}   Suicidal Thoughts:  {PSY:22692}  Homicidal Thoughts:  {PSY:22692}  Memory:  {PSY:[phone]}  Judgement:  {PSY:22694}  Insight:  {PSY:22695}  Psychomotor Activity:  {PSY:22696}  Concentration:  {PSY:21399}  Recall:  {PSY:22877}  Fund of Knowledge: {PSY:22877}  Language: [id number]  Assets:  {PSY:22698}  ADL's:  {PSY:22290}  Cognition: [id number]  Prognosis:  {PSY:22877}   Screenings:  PHQ2-9    [facility] Visit from 03/13/2022 in [facility]  PHQ-2 Total Score 0      Flowsheet Row ED to Hosp-Admission (Discharged) from 03/15/2022 in [facility] (1A) ED from 02/28/2022 in [facility] DEPT ED from 12/16/2021 in [facility] No Risk High Risk No Risk       Receiving Psychotherapy: [id number]  Treatment Plan/Recommendations: ***    Monna Fam, NP

## 2022-03-31 ENCOUNTER — Other Ambulatory Visit (HOSPITAL_COMMUNITY): Payer: Self-pay

## 2022-04-02 ENCOUNTER — Inpatient Hospital Stay
Admission: EM | Admit: 2022-04-02 | Discharge: 2022-04-08 | DRG: 975 | Discharge status: Against Medical Advice | Payer: No Typology Code available for payment source | Attending: Internal Medicine | Admitting: Internal Medicine

## 2022-04-02 DIAGNOSIS — R000 Tachycardia, unspecified: Secondary | ICD-10-CM | POA: Diagnosis not present

## 2022-04-02 DIAGNOSIS — D75839 Thrombocytosis, unspecified: Secondary | ICD-10-CM | POA: Diagnosis present

## 2022-04-02 DIAGNOSIS — B59 Pneumocystosis: Secondary | ICD-10-CM

## 2022-04-02 DIAGNOSIS — R45851 Suicidal ideations: Secondary | ICD-10-CM

## 2022-04-02 DIAGNOSIS — F203 Undifferentiated schizophrenia: Secondary | ICD-10-CM

## 2022-04-02 DIAGNOSIS — J189 Pneumonia, unspecified organism: Secondary | ICD-10-CM | POA: Diagnosis not present

## 2022-04-02 DIAGNOSIS — R6510 Systemic inflammatory response syndrome (SIRS) of non-infectious origin without acute organ dysfunction: Secondary | ICD-10-CM

## 2022-04-02 DIAGNOSIS — Z888 Allergy status to other drugs, medicaments and biological substances status: Secondary | ICD-10-CM

## 2022-04-02 DIAGNOSIS — Z1152 Encounter for screening for COVID-19: Secondary | ICD-10-CM

## 2022-04-02 DIAGNOSIS — B1910 Unspecified viral hepatitis B without hepatic coma: Secondary | ICD-10-CM | POA: Diagnosis present

## 2022-04-02 DIAGNOSIS — B20 Human immunodeficiency virus [HIV] disease: Secondary | ICD-10-CM

## 2022-04-02 DIAGNOSIS — A310 Pulmonary mycobacterial infection: Secondary | ICD-10-CM | POA: Diagnosis present

## 2022-04-02 DIAGNOSIS — R509 Fever, unspecified: Secondary | ICD-10-CM | POA: Diagnosis not present

## 2022-04-02 DIAGNOSIS — Z91148 Patient's other noncompliance with medication regimen for other reason: Secondary | ICD-10-CM

## 2022-04-02 DIAGNOSIS — Z87891 Personal history of nicotine dependence: Secondary | ICD-10-CM

## 2022-04-02 DIAGNOSIS — F1590 Other stimulant use, unspecified, uncomplicated: Secondary | ICD-10-CM | POA: Diagnosis present

## 2022-04-02 DIAGNOSIS — F419 Anxiety disorder, unspecified: Secondary | ICD-10-CM | POA: Diagnosis present

## 2022-04-02 DIAGNOSIS — F29 Unspecified psychosis not due to a substance or known physiological condition: Principal | ICD-10-CM

## 2022-04-02 DIAGNOSIS — F1290 Cannabis use, unspecified, uncomplicated: Secondary | ICD-10-CM | POA: Diagnosis present

## 2022-04-02 DIAGNOSIS — J168 Pneumonia due to other specified infectious organisms: Secondary | ICD-10-CM | POA: Diagnosis not present

## 2022-04-02 DIAGNOSIS — Z79899 Other long term (current) drug therapy: Secondary | ICD-10-CM

## 2022-04-02 LAB — SALICYLATE LEVEL: Salicylate Lvl: <7 mg/dL — ABNORMAL LOW (ref 7.0–30.0)

## 2022-04-02 LAB — COMPREHENSIVE METABOLIC PANEL
ALT: 18 U/L (ref 0–44)
AST: 33 U/L (ref 15–41)
Albumin: 3.1 g/dL — ABNORMAL LOW (ref 3.5–5.0)
Alkaline Phosphatase: 293 U/L — ABNORMAL HIGH (ref 38–126)
Anion gap: 8 (ref 5–15)
BUN: 11 mg/dL (ref 6–20)
CO2: 24 mmol/L (ref 22–32)
Calcium: 8.6 mg/dL — ABNORMAL LOW (ref 8.9–10.3)
Chloride: 102 mmol/L (ref 98–111)
Creatinine, Ser: 0.99 mg/dL (ref 0.61–1.24)
GFR, Estimated: >60 mL/min (ref >60)
Glucose, Bld: 81 mg/dL (ref 70–99)
Potassium: 3.6 mmol/L (ref 3.5–5.1)
Sodium: 134 mmol/L — ABNORMAL LOW (ref 135–145)
Total Bilirubin: 0.7 mg/dL (ref 0.3–1.2)
Total Protein: 8.7 g/dL — ABNORMAL HIGH (ref 6.5–8.1)

## 2022-04-02 LAB — URINE DRUG SCREEN, QUALITATIVE (ARMC ONLY)
Amphetamines, Ur Screen: POSITIVE — AB
Barbiturates, Ur Screen: NOT DETECTED
Benzodiazepine, Ur Scrn: NOT DETECTED
Cannabinoid 50 Ng, Ur ~~LOC~~: NOT DETECTED
Cocaine Metabolite,Ur ~~LOC~~: NOT DETECTED
MDMA (Ecstasy)Ur Screen: NOT DETECTED
Methadone Scn, Ur: NOT DETECTED
Opiate, Ur Screen: NOT DETECTED
Phencyclidine (PCP) Ur S: NOT DETECTED
Tricyclic, Ur Screen: NOT DETECTED

## 2022-04-02 LAB — CBC
HCT: 27.9 % — ABNORMAL LOW (ref 39.0–52.0)
Hemoglobin: 8.5 g/dL — ABNORMAL LOW (ref 13.0–17.0)
MCH: 26.2 pg (ref 26.0–34.0)
MCHC: 30.5 g/dL (ref 30.0–36.0)
MCV: 85.8 fL (ref 80.0–100.0)
Platelets: 485 10*3/uL — ABNORMAL HIGH (ref 150–400)
RBC: 3.25 MIL/uL — ABNORMAL LOW (ref 4.22–5.81)
RDW: 19.3 % — ABNORMAL HIGH (ref 11.5–15.5)
WBC: 11.4 10*3/uL — ABNORMAL HIGH (ref 4.0–10.5)
nRBC: 0 % (ref 0.0–0.2)

## 2022-04-02 LAB — ACETAMINOPHEN LEVEL: Acetaminophen (Tylenol), Serum: <10 ug/mL — ABNORMAL LOW (ref 10–30)

## 2022-04-02 LAB — ETHANOL: Alcohol, Ethyl (B): <10 mg/dL (ref <10)

## 2022-04-02 NOTE — ED Notes (Signed)
IVC prior to arrival/ Consult ordered/pending

## 2022-04-02 NOTE — ED Provider Notes (Signed)
[facility] Provider Note    Event Date/Time   First MD Initiated Contact with Patient 04/02/22 2048     (approximate)   History   No chief complaint on file.   HPI  Michael Chandler is a 36 y.o. male with history of HIV, depression, and schizophrenia who presents under involuntary commitment for erratic behavior.  Per the commitment paperwork, the patient has not been compliant with his medications and has been acting out aggressively towards family members and threatening them.  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  The patient himself denies any acute psychiatric or medical complaints.  He states he woke up and was suddenly brought into the hospital and does not know why.  I reviewed the past medical records.  The patient was just admitted medically.  Per the hospitalist discharge summary from 11/29 he presented with neck pain and cough, and neck swelling.  He was treated for sepsis secondary to lymphadenitis and sinusitis.   Physical Exam   Triage Vital Signs: ED Triage Vitals [04/02/22 2041]  Enc Vitals Group     BP 121/77     Pulse Rate (!) 116     Resp 18     Temp 99.2 F (37.3 C)     Temp Source Oral     SpO2 98 %     Weight 200 lb (90.7 kg)     Height      Head Circumference      Peak Flow      Pain Score      Pain Loc      Pain Edu?      Excl. in GC?     Most recent vital signs: Vitals:   04/02/22 2041  BP: 121/77  Pulse: (!) 116  Resp: 18  Temp: 99.2 F (37.3 C)  SpO2: 98%     General: Alert, comfortable appearing. CV:  Good peripheral perfusion.  Resp:  Normal effort.  Abd:  No distention.  Other:  Neck supple with full range of motion.  Disorganized thought, tangential in answering questions.   ED Results / Procedures / Treatments   Labs (all labs ordered are listed, but only abnormal results are displayed) Labs Reviewed  COMPREHENSIVE METABOLIC PANEL -  Abnormal; Notable for the following components:      Result Value   Sodium 134 (*)    Calcium 8.6 (*)    Total Protein 8.7 (*)    Albumin 3.1 (*)    Alkaline Phosphatase 293 (*)    All other components within normal limits  SALICYLATE LEVEL - Abnormal; Notable for the following components:   Salicylate Lvl <7.0 (*)    All other components within normal limits  ACETAMINOPHEN LEVEL - Abnormal; Notable for the following components:   Acetaminophen (Tylenol), Serum <10 (*)    All other components within normal limits  CBC - Abnormal; Notable for the following components:   WBC 11.4 (*)    RBC 3.25 (*)    Hemoglobin 8.5 (*)    HCT 27.9 (*)    RDW 19.3 (*)    Platelets 485 (*)    All other components within normal limits  URINE DRUG SCREEN, QUALITATIVE ([facility] ONLY) - Abnormal; Notable for the following components:   Amphetamines, Ur Screen POSITIVE (*)    All other components within normal limits  ETHANOL     EKG    RADIOLOGY  PROCEDURES:  Critical Care performed: No  Procedures   MEDICATIONS ORDERED IN ED: Medications - No data to display   IMPRESSION / MDM / ASSESSMENT AND PLAN / ED COURSE  I reviewed the triage vital signs and the nursing notes.  36 year old male with PMH as noted above presents under involuntary commitment for erratic behavior including threats towards others as well as possible psychosis.  The patient denies any acute psychiatric or medical complaints, although he is disorganized and tangential in answering questions.  Physical exam is otherwise unremarkable.  Differential diagnosis includes, but is not limited to, exacerbation of schizophrenia, other acute psychosis, substance induced psychiatric disorder.  Will obtain lab workup for medical screening, psychiatry and TTS consult, and reassess.  Patient's presentation is most consistent with severe exacerbation of chronic illness.  ----------------------------------------- 10:01 PM on  04/02/2022 -----------------------------------------  Lab workup is overall unremarkable.  He has mild leukocytosis which is trending down from his recent admission.  CBC also shows chronic anemia.  There is mild elevation to the alkaline phosphatase which is also trending down from the admission.  UDS is positive for amphetamines.  Psychiatry consult is pending.  ----------------------------------------- 11:11 PM on 04/02/2022 -----------------------------------------  I consulted and discussed the case with NP Dixon from psychiatry who evaluated the patient.  She recommends inpatient psychiatric admission.   FINAL CLINICAL IMPRESSION(S) / ED DIAGNOSES   Final diagnoses:  Psychosis, unspecified psychosis type ([facility])     Rx / DC Orders   ED Discharge Orders     None        Note:  This document was prepared using Dragon voice recognition software and may include unintentional dictation errors.    Dionne Bucy, MD 04/02/22 2312
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
## 2022-04-02 NOTE — BH Assessment (Addendum)
Referral information for Psychiatric Hospitalization faxed to:  [facility] [phone])  Earlene Plater [phone]), No male beds available at this time.  [address] [phone]),   [facility] [phone] -or- [phone]),   [facility] [phone])  [facility] [phone])  Mannie Stabile [phone])
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
## 2022-04-03 ENCOUNTER — Emergency Department: Payer: No Typology Code available for payment source

## 2022-04-03 DIAGNOSIS — R000 Tachycardia, unspecified: Secondary | ICD-10-CM | POA: Diagnosis not present

## 2022-04-03 DIAGNOSIS — R509 Fever, unspecified: Secondary | ICD-10-CM | POA: Diagnosis not present

## 2022-04-03 LAB — COMPREHENSIVE METABOLIC PANEL
ALT: 16 U/L (ref 0–44)
AST: 33 U/L (ref 15–41)
Albumin: 2.6 g/dL — ABNORMAL LOW (ref 3.5–5.0)
Alkaline Phosphatase: 256 U/L — ABNORMAL HIGH (ref 38–126)
Anion gap: 8 (ref 5–15)
BUN: 13 mg/dL (ref 6–20)
CO2: 23 mmol/L (ref 22–32)
Calcium: 8.3 mg/dL — ABNORMAL LOW (ref 8.9–10.3)
Chloride: 103 mmol/L (ref 98–111)
Creatinine, Ser: 1.14 mg/dL (ref 0.61–1.24)
GFR, Estimated: >60 mL/min (ref >60)
Glucose, Bld: 98 mg/dL (ref 70–99)
Potassium: 4 mmol/L (ref 3.5–5.1)
Sodium: 134 mmol/L — ABNORMAL LOW (ref 135–145)
Total Bilirubin: 0.6 mg/dL (ref 0.3–1.2)
Total Protein: 8 g/dL (ref 6.5–8.1)

## 2022-04-03 LAB — CBC WITH DIFFERENTIAL/PLATELET
Abs Immature Granulocytes: 0.05 10*3/uL (ref 0.00–0.07)
Basophils Absolute: 0 10*3/uL (ref 0.0–0.1)
Basophils Relative: 0 %
Eosinophils Absolute: 0.1 10*3/uL (ref 0.0–0.5)
Eosinophils Relative: 1 %
HCT: 28.5 % — ABNORMAL LOW (ref 39.0–52.0)
Hemoglobin: 8.7 g/dL — ABNORMAL LOW (ref 13.0–17.0)
Immature Granulocytes: 1 %
Lymphocytes Relative: 40 %
Lymphs Abs: 2.9 10*3/uL (ref 0.7–4.0)
MCH: 26 pg (ref 26.0–34.0)
MCHC: 30.5 g/dL (ref 30.0–36.0)
MCV: 85.3 fL (ref 80.0–100.0)
Monocytes Absolute: 1.3 10*3/uL — ABNORMAL HIGH (ref 0.1–1.0)
Monocytes Relative: 18 %
Neutro Abs: 2.8 10*3/uL (ref 1.7–7.7)
Neutrophils Relative %: 40 %
Platelets: 425 10*3/uL — ABNORMAL HIGH (ref 150–400)
RBC: 3.34 MIL/uL — ABNORMAL LOW (ref 4.22–5.81)
RDW: 19.1 % — ABNORMAL HIGH (ref 11.5–15.5)
WBC: 7.1 10*3/uL (ref 4.0–10.5)
nRBC: 0 % (ref 0.0–0.2)

## 2022-04-03 LAB — RESP PANEL BY RT-PCR (RSV, FLU A&B, COVID)  RVPGX2
Influenza A by PCR: NEGATIVE
Influenza B by PCR: NEGATIVE
Resp Syncytial Virus by PCR: NEGATIVE
SARS Coronavirus 2 by RT PCR: NEGATIVE

## 2022-04-03 LAB — URINALYSIS, ROUTINE W REFLEX MICROSCOPIC
Bacteria, UA: NONE SEEN
Bilirubin Urine: NEGATIVE
Glucose, UA: NEGATIVE mg/dL
Hgb urine dipstick: NEGATIVE
Ketones, ur: NEGATIVE mg/dL
Leukocytes,Ua: NEGATIVE
Nitrite: NEGATIVE
Protein, ur: 30 mg/dL — AB
Specific Gravity, Urine: 1.016 (ref 1.005–1.030)
Squamous Epithelial / HPF: NONE SEEN (ref 0–5)
pH: 7 (ref 5.0–8.0)

## 2022-04-03 LAB — LACTIC ACID, PLASMA: Lactic Acid, Venous: 1.2 mmol/L (ref 0.5–1.9)

## 2022-04-03 LAB — SARS CORONAVIRUS 2 BY RT PCR: SARS Coronavirus 2 by RT PCR: NEGATIVE

## 2022-04-03 MED ORDER — ACETAMINOPHEN 500 MG PO TABS
1000.0000 mg | ORAL_TABLET | Freq: Once | ORAL | Status: AC
  Administered 2022-04-03: 1000 mg via ORAL
  Filled 2022-04-03: qty 2

## 2022-04-03 MED ORDER — BENZTROPINE MESYLATE 1 MG PO TABS
1.0000 mg | ORAL_TABLET | Freq: Every day | ORAL | Status: DC
  Administered 2022-04-03 – 2022-04-08 (×6): 1 mg via ORAL
  Filled 2022-04-03 (×6): qty 1

## 2022-04-03 MED ORDER — HALOPERIDOL 5 MG PO TABS
5.0000 mg | ORAL_TABLET | Freq: Two times a day (BID) | ORAL | Status: DC
  Administered 2022-04-03 – 2022-04-08 (×10): 5 mg via ORAL
  Filled 2022-04-03 (×10): qty 1
  Filled 2022-04-03: qty 10
  Filled 2022-04-03: qty 1

## 2022-04-03 MED ORDER — LORAZEPAM 2 MG/ML IJ SOLN
1.0000 mg | Freq: Once | INTRAMUSCULAR | Status: AC | PRN
  Administered 2022-04-06: 1 mg via INTRAMUSCULAR
  Filled 2022-04-03: qty 1

## 2022-04-03 MED ORDER — SODIUM CHLORIDE 0.9 % IV SOLN
500.0000 mg | Freq: Once | INTRAVENOUS | Status: AC
  Administered 2022-04-03: 500 mg via INTRAVENOUS
  Filled 2022-04-03 (×3): qty 5

## 2022-04-03 MED ORDER — DAPSONE 100 MG PO TABS
100.0000 mg | ORAL_TABLET | Freq: Every day | ORAL | Status: DC
  Administered 2022-04-03 – 2022-04-07 (×5): 100 mg via ORAL
  Filled 2022-04-03 (×5): qty 1

## 2022-04-03 MED ORDER — FOLIC ACID 1 MG PO TABS
1.0000 mg | ORAL_TABLET | Freq: Every day | ORAL | Status: DC
  Administered 2022-04-03 – 2022-04-08 (×6): 1 mg via ORAL
  Filled 2022-04-03 (×6): qty 1

## 2022-04-03 MED ORDER — SODIUM CHLORIDE 0.9 % IV SOLN
1.0000 g | Freq: Once | INTRAVENOUS | Status: AC
  Administered 2022-04-03: 1 g via INTRAVENOUS
  Filled 2022-04-03: qty 10

## 2022-04-03 MED ORDER — BICTEGRAVIR-EMTRICITAB-TENOFOV 50-200-25 MG PO TABS
1.0000 | ORAL_TABLET | Freq: Every day | ORAL | Status: DC
  Administered 2022-04-03 – 2022-04-05 (×3): 1 via ORAL
  Filled 2022-04-03 (×3): qty 1

## 2022-04-03 MED ORDER — MIRTAZAPINE 15 MG PO TBDP
15.0000 mg | ORAL_TABLET | Freq: Every day | ORAL | Status: DC
  Administered 2022-04-03 – 2022-04-07 (×5): 15 mg via ORAL
  Filled 2022-04-03 (×6): qty 1

## 2022-04-03 MED ORDER — THIAMINE MONONITRATE 100 MG PO TABS
100.0000 mg | ORAL_TABLET | Freq: Every day | ORAL | Status: DC
  Administered 2022-04-03 – 2022-04-08 (×6): 100 mg via ORAL
  Filled 2022-04-03 (×6): qty 1

## 2022-04-03 MED ORDER — OLANZAPINE 5 MG PO TBDP
10.0000 mg | ORAL_TABLET | Freq: Once | ORAL | Status: AC
  Administered 2022-04-03: 10 mg via ORAL
  Filled 2022-04-03: qty 2

## 2022-04-03 MED ORDER — HYDROXYZINE HCL 50 MG PO TABS
25.0000 mg | ORAL_TABLET | Freq: Three times a day (TID) | ORAL | Status: DC | PRN
  Administered 2022-04-03 – 2022-04-07 (×4): 25 mg via ORAL
  Filled 2022-04-03 (×5): qty 1

## 2022-04-03 NOTE — Progress Notes (Addendum)
At 2100 Mannie Stabile called to inquire about referral. Pt is under review.    At 1700 this CSW faxed referral to out of network providers. CSW will assist and follow with placement.   Destination  Service Provider Address Phone Fax  [facility]  [address] [place] [address] [phone]  [facility]  [address] [place] [address] [phone]  [facility]  [address], [place] [address] [phone]  [facility]  [address] [place] [address] [phone]  [facility]  [address], [place] [address] [phone]  [facility]  [address] [place] [address] [phone]  [facility]  [address] [place] [address] [phone]  [facility]  [address] [place] [address] [phone]  [facility]  [address] [place], [place] [address] [phone]  [facility] [address] [place] [address] [phone]  [facility]  [address] [place] [address] [phone]   Maryjean Ka, MSW, LCSWA 04/03/2022 9:05 PM

## 2022-04-03 NOTE — ED Notes (Signed)
Pt was asked to do a COVID test, pt became actively more aggressive with staff. RN notified

## 2022-04-03 NOTE — ED Notes (Signed)
Pt moved to room 22 from [facility] with this nurse and security. Pt tolerated well.

## 2022-04-03 NOTE — ED Notes (Signed)
Pt given meal tray and drink.

## 2022-04-03 NOTE — ED Notes (Signed)
Patient loudly demanding more juice, explained per policy we could only give water at this time, pt responded "I'm a grown ass man I don't won't no water."  Attempted to explain, but patient would speak loudly over this RN and EDT.  Patient then stating that he is hungry and wanted another sandwich tray, explained to patient that there were no more trays in the department.  Patient again speaking loudly and then began to demand food and drink.  MD made aware, patient to be moved the [facility].

## 2022-04-03 NOTE — ED Provider Notes (Signed)
Emergency Medicine Observation Re-evaluation Note  Michael Chandler is a 36 y.o. male, seen on rounds today.  Pt initially presented to the ED for complaints of No chief complaint on file. Currently, the patient is laying in bed eating, denies any complaints.  Physical Exam  BP 98/77 (BP Location: Left Arm)   Pulse 88   Temp 98.4 F (36.9 C) (Oral)   Resp 18   Wt 90.7 kg   SpO2 98%   BMI 23.72 kg/m  Physical Exam Constitutional: Resting comfortably. Eyes: Conjunctivae are normal. Head: Atraumatic. Nose: No congestion/rhinnorhea. Mouth/Throat: Mucous membranes are moist. Neck: Normal ROM Cardiovascular: No cyanosis noted. Respiratory: Normal respiratory effort. Gastrointestinal: Non-distended. Genitourinary: deferred Musculoskeletal: No lower extremity tenderness nor edema. Neurologic:  Normal speech and language. No gross focal neurologic deficits are appreciated. Skin:  Skin is warm, dry and intact. No rash noted.   ED Course / MDM  EKG:   I have reviewed the labs performed to date as well as medications administered while in observation.  Recent changes in the last 24 hours include none.  Plan  Current plan is for psychiatric admission, awaiting placement.    Chesley Noon, MD 04/03/22 1058

## 2022-04-03 NOTE — ED Notes (Signed)
Pt given snack. 

## 2022-04-03 NOTE — ED Notes (Signed)
Pt asked to take a shower. Pt given clean hospital scrubs and clean supplies

## 2022-04-03 NOTE — ED Notes (Addendum)
MD notified of pt VS recheck; xray at the bedside

## 2022-04-03 NOTE — ED Notes (Signed)
Snacks given to pt. Pt refused water at this moment.

## 2022-04-03 NOTE — BH Assessment (Signed)
Referral Checks:   [facility] [phone]) Pt under review.   Michael Chandler [phone]), No answer   Michael Chandler [phone]), Per Darl Pikes, pt is denied; reason unspecified.   Michael Chandler [phone]- [phone]), Per Cassandra, Pt denied due to financial.   Michael Chandler [phone]) A voicemail was left requesting a call back.    [facility] [phone]) No answer; a voicemail requesting a callback.   Michael Chandler [phone]) No answer; a voicemail requesting a callback.

## 2022-04-03 NOTE — ED Notes (Signed)
C/O anxiety. Also requesting additional food trays.  Reinforced that dinner tray had been given and eaten, no additional tray will be obtained.  Understanding verbalized.

## 2022-04-03 NOTE — ED Notes (Signed)
Dr. Darnelle Catalan informed of pt abnormal VS. Labs and xray ordered by MD. Pt states he is not feeling badly and denies body aches cough or congestion.

## 2022-04-03 NOTE — ED Notes (Signed)
Patient given sandwich tray and juice °

## 2022-04-03 NOTE — ED Notes (Signed)
Snack and drink given 

## 2022-04-03 NOTE — ED Notes (Addendum)
This nurse spoke to Dr. Darnelle Catalan to inform him of pt chest xray results. Md requests pt be moved back to quad for antibiotic and supportive medical care. Charge nurse and quad nurse aware. Report given.

## 2022-04-03 NOTE — ED Provider Notes (Signed)
Patient running a low-grade fever is somewhat tachypneic was more tachycardic earlier blood pressure is on the low side.  I will order flu and RSV chest x-ray and other lab work to evaluate him more fully.  He is not complaining of any pain or dysuria or coughing.   Arnaldo Natal, MD 04/03/22 2100

## 2022-04-03 NOTE — ED Notes (Signed)
ivc/pending placement.. 

## 2022-04-03 NOTE — ED Notes (Signed)
Pt asking to have his light turned on in his room. Pt states he is going to sleep. Lights turned off by security; provided for pt comfort.

## 2022-04-04 ENCOUNTER — Inpatient Hospital Stay: Payer: No Typology Code available for payment source | Admitting: Infectious Diseases

## 2022-04-04 ENCOUNTER — Other Ambulatory Visit: Payer: Self-pay

## 2022-04-04 DIAGNOSIS — Z1152 Encounter for screening for COVID-19: Secondary | ICD-10-CM | POA: Diagnosis not present

## 2022-04-04 DIAGNOSIS — D649 Anemia, unspecified: Secondary | ICD-10-CM

## 2022-04-04 DIAGNOSIS — F1290 Cannabis use, unspecified, uncomplicated: Secondary | ICD-10-CM | POA: Diagnosis not present

## 2022-04-04 DIAGNOSIS — R45851 Suicidal ideations: Secondary | ICD-10-CM | POA: Diagnosis not present

## 2022-04-04 DIAGNOSIS — Z87891 Personal history of nicotine dependence: Secondary | ICD-10-CM

## 2022-04-04 DIAGNOSIS — D75839 Thrombocytosis, unspecified: Secondary | ICD-10-CM | POA: Diagnosis not present

## 2022-04-04 DIAGNOSIS — R6510 Systemic inflammatory response syndrome (SIRS) of non-infectious origin without acute organ dysfunction: Secondary | ICD-10-CM | POA: Diagnosis not present

## 2022-04-04 DIAGNOSIS — Z888 Allergy status to other drugs, medicaments and biological substances status: Secondary | ICD-10-CM | POA: Diagnosis not present

## 2022-04-04 DIAGNOSIS — B20 Human immunodeficiency virus [HIV] disease: Secondary | ICD-10-CM | POA: Diagnosis not present

## 2022-04-04 DIAGNOSIS — F209 Schizophrenia, unspecified: Secondary | ICD-10-CM

## 2022-04-04 DIAGNOSIS — A310 Pulmonary mycobacterial infection: Secondary | ICD-10-CM | POA: Diagnosis not present

## 2022-04-04 DIAGNOSIS — Z91148 Patient's other noncompliance with medication regimen for other reason: Secondary | ICD-10-CM | POA: Diagnosis not present

## 2022-04-04 DIAGNOSIS — B1910 Unspecified viral hepatitis B without hepatic coma: Secondary | ICD-10-CM | POA: Diagnosis not present

## 2022-04-04 DIAGNOSIS — F419 Anxiety disorder, unspecified: Secondary | ICD-10-CM | POA: Diagnosis not present

## 2022-04-04 DIAGNOSIS — B59 Pneumocystosis: Secondary | ICD-10-CM | POA: Diagnosis not present

## 2022-04-04 DIAGNOSIS — J189 Pneumonia, unspecified organism: Principal | ICD-10-CM

## 2022-04-04 DIAGNOSIS — R000 Tachycardia, unspecified: Secondary | ICD-10-CM | POA: Diagnosis not present

## 2022-04-04 DIAGNOSIS — Z79899 Other long term (current) drug therapy: Secondary | ICD-10-CM | POA: Diagnosis not present

## 2022-04-04 DIAGNOSIS — F203 Undifferentiated schizophrenia: Secondary | ICD-10-CM | POA: Diagnosis not present

## 2022-04-04 DIAGNOSIS — F1590 Other stimulant use, unspecified, uncomplicated: Secondary | ICD-10-CM | POA: Diagnosis not present

## 2022-04-04 MED ORDER — ENOXAPARIN SODIUM 40 MG/0.4ML IJ SOSY
40.0000 mg | PREFILLED_SYRINGE | INTRAMUSCULAR | Status: DC
  Administered 2022-04-04 – 2022-04-07 (×4): 40 mg via SUBCUTANEOUS
  Filled 2022-04-04 (×4): qty 0.4

## 2022-04-04 MED ORDER — SODIUM CHLORIDE 0.9 % IV SOLN
500.0000 mg | Freq: Once | INTRAVENOUS | Status: AC
  Administered 2022-04-04: 500 mg via INTRAVENOUS
  Filled 2022-04-04: qty 5

## 2022-04-04 MED ORDER — RIFAMPIN 300 MG PO CAPS
600.0000 mg | ORAL_CAPSULE | Freq: Every day | ORAL | Status: DC
  Filled 2022-04-04: qty 2

## 2022-04-04 MED ORDER — LACTATED RINGERS IV SOLN: INTRAVENOUS | Status: DC

## 2022-04-04 MED ORDER — ETHAMBUTOL HCL 400 MG PO TABS
1200.0000 mg | ORAL_TABLET | Freq: Every day | ORAL | Status: DC
  Administered 2022-04-04 – 2022-04-08 (×5): 1200 mg via ORAL
  Filled 2022-04-04 (×7): qty 3

## 2022-04-04 MED ORDER — ETHAMBUTOL HCL 400 MG PO TABS
15.0000 mg/kg | ORAL_TABLET | Freq: Every day | ORAL | Status: DC
  Filled 2022-04-04: qty 1.5

## 2022-04-04 MED ORDER — AZITHROMYCIN 500 MG PO TABS
500.0000 mg | ORAL_TABLET | Freq: Every day | ORAL | Status: DC
  Administered 2022-04-05 – 2022-04-08 (×4): 500 mg via ORAL
  Filled 2022-04-04 (×4): qty 1

## 2022-04-04 MED ORDER — SODIUM CHLORIDE 0.9 % IV BOLUS
1000.0000 mL | Freq: Once | INTRAVENOUS | Status: AC
  Administered 2022-04-04: 1000 mL via INTRAVENOUS

## 2022-04-04 NOTE — Consult Note (Signed)
NAME: Michael Chandler  DOB: 1986-01-12  MRN: [id number]  Date/Time: 04/04/2022 3:12 PM  REQUESTING PROVIDER: Agbata Subjective:  REASON FOR CONSULT: Fever/MAC ? Michael Chandler is a 36 y.o. male with a history of AIDS, schizophrenia, substance use was recently in [facility] 11/22-11/29  with fever and enlarged cervical  lymph node on the left side and was tretaed with antibiotics and also under went biopsy. 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 He is brought back to the ED after mom got IVC for his psychotic behavior In the ED he had fever and had CXR which showed b/l infiltrate He does not complain of sob or cough The lymphnodes on the left side is smaller than before He is on [place] from 03/13/22 Cd4 is 181 from 03/18/22   Past Medical History:  Diagnosis Date   HIV (human immunodeficiency virus infection) ([place])    Schizophrenia ([place])    per IVC paperwork, pt states does not have this dx    Past Surgical History:  Procedure Laterality Date   IR US GUIDE BX ASP/DRAIN  03/21/2022    Social History   Socioeconomic History   Marital status: Single    Spouse name: Not on file   Number of children: Not on file   Years of education: Not on file   Highest education level: Not on file  Occupational History   Not on file  Tobacco Use   Smoking status: Former    Types: Cigarettes   Smokeless tobacco: Never   Tobacco comments:    Patient denies using tobacco products (03/13/22)  Substance and Sexual Activity   Alcohol use: Yes    Comment: occasional   Drug use: Yes    Types: Methamphetamines, Marijuana, Cocaine    Comment: states occasional marijuana and meth use   Sexual activity: Not Currently    Comment: condoms accepted  Other Topics Concern   Not on file  Social  History Narrative   Not on file   Social Determinants of Health   Financial Resource Strain: Not on file  Food Insecurity: No Food Insecurity (03/16/2022)   Hunger Vital Sign    Worried About Running Out of Food in the Last Year: Never true    Ran Out of Food in the Last Year: Never true  Recent Concern: [place] Present (03/02/2022)   Hunger Vital Sign    Worried About [place] in the Last Year: Not on file    Ran Out of Food in the Last Year: Sometimes true  Transportation Needs: No Transportation Needs (03/16/2022)   PRAPARE - Hydrologist (Medical): No    Lack of Transportation (Non-Medical): No  Physical Activity: Not on file  Stress: Not on file  Social Connections: Not on file  Intimate Partner Violence: Not At Risk (03/16/2022)   Humiliation, Afraid, Rape, and Kick questionnaire    Fear of Current or Ex-Partner: No    Emotionally Abused: No    Physically Abused: No    Sexually Abused: No    No family history on file. Allergies  Allergen Reactions   Bactrim [Sulfamethoxazole-Trimethoprim] Anaphylaxis   I? Current Facility-Administered Medications  Medication Dose Route Frequency Provider Last  Rate Last Admin   [START ON 04/05/2022] azithromycin (ZITHROMAX) tablet 500 mg  500 mg Oral Daily [facility], Joellyn Quails, MD       benztropine (COGENTIN) tablet 1 mg  1 mg Oral Daily Waldon Merl F, NP   1 mg at 04/04/22 1006   bictegravir-emtricitabine-tenofovir AF (BIKTARVY) 50-200-25 MG per tablet 1 tablet  1 tablet Oral Daily Blake Divine, MD   1 tablet at 04/04/22 1011   dapsone tablet 100 mg  100 mg Oral Daily Blake Divine, MD   100 mg at 04/04/22 1006   ethambutol (MYAMBUTOL) tablet 1,200 mg  1,200 mg Oral Daily Tsosie Billing, MD   1,200 mg at 81/82/99 3716   folic acid (FOLVITE) tablet 1 mg  1 mg Oral Daily Blake Divine, MD   1 mg at 04/04/22 1006   haloperidol (HALDOL) tablet 5 mg  5 mg  Oral BID Waldon Merl F, NP   5 mg at 04/04/22 1006   hydrOXYzine (ATARAX) tablet 25 mg  25 mg Oral TID PRN Sherlon Handing, NP   25 mg at 04/03/22 1709   lactated ringers infusion   Intravenous Continuous Agbata, Tochukwu, MD 125 mL/hr at 04/04/22 1233 Restarted at 04/04/22 1233   LORazepam (ATIVAN) injection 1 mg  1 mg Intramuscular Once PRN Duffy Bruce, MD       mirtazapine (REMERON SOL-TAB) disintegrating tablet 15 mg  15 mg Oral QHS Blake Divine, MD   15 mg at 04/03/22 2304   thiamine (VITAMIN B1) tablet 100 mg  100 mg Oral Daily Blake Divine, MD   100 mg at 04/04/22 1007     Abtx:  Anti-infectives (From admission, onward)    Start     Dose/Rate Route Frequency Ordered Stop   04/05/22 1000  azithromycin (ZITHROMAX) tablet 500 mg        500 mg Oral Daily 04/04/22 0936     04/04/22 1000  rifampin (RIFADIN) capsule 600 mg  Status:  Discontinued        600 mg Oral Daily 04/04/22 0748 04/04/22 0934   04/04/22 1000  ethambutol (MYAMBUTOL) tablet 1,350 mg  Status:  Discontinued        15 mg/kg  90.7 kg Oral Daily 04/04/22 0748 04/04/22 0935   04/04/22 1000  ethambutol (MYAMBUTOL) tablet 1,200 mg        1,200 mg Oral Daily 04/04/22 0935     04/04/22 0800  azithromycin (ZITHROMAX) 500 mg in sodium chloride 0.9 % 250 mL IVPB        500 mg 250 mL/hr over 60 Minutes Intravenous  Once 04/04/22 0748 04/04/22 0951   04/03/22 2200  ceFEPIme (MAXIPIME) 1 g in sodium chloride 0.9 % 100 mL IVPB        1 g 200 mL/hr over 30 Minutes Intravenous  Once 04/03/22 2129 04/03/22 2256   04/03/22 2130  azithromycin (ZITHROMAX) 500 mg in sodium chloride 0.9 % 250 mL IVPB        500 mg 250 mL/hr over 60 Minutes Intravenous  Once 04/03/22 2129 04/04/22 0005   04/03/22 1300  bictegravir-emtricitabine-tenofovir AF (BIKTARVY) 50-200-25 MG per tablet 1 tablet        1 tablet Oral Daily 04/03/22 1227     04/03/22 1300  dapsone tablet 100 mg        100 mg Oral Daily 04/03/22 1227         REVIEW  OF SYSTEMS:  Const: + fever, negative chills, some  weight loss Eyes: negative diplopia  or visual changes, negative eye pain ENT: negative coryza, negative sore throat Resp: negative cough, hemoptysis, dyspnea Cards: negative for chest pain, palpitations, lower extremity edema GU: negative for frequency, dysuria and hematuria GI: Negative for abdominal pain, diarrhea, bleeding, constipation Skin: negative for rash and pruritus Heme: negative for easy bruising and gum/nose bleeding MS: negative for myalgias, arthralgias, back pain and muscle weakness Neurolo:negative for headaches, dizziness, vertigo, memory problems  Psych: pt is not aware of his behavior  Pt has bene in denial with his AIDS/HIV diagnosis and has denied it multiple times, but of late he is taking meds Endocrine: negative for thyroid, diabetes Allergy/Immunology- sulfa  ?  Objective:  VITALS:  BP 119/74 (BP Location: Left Arm)   Pulse 99   Temp 98.7 F (37.1 C)   Resp 16   Ht _0  (1.956 m)   Wt 90.7 kg   SpO2 100%   BMI 23.72 kg/m   PHYSICAL EXAM:  General: in bed, alert  Sitter in the room.  Head: Normocephalic, without obvious abnormality, atraumatic. Eyes: Conjunctivae clear, anicteric sclerae. Pupils are equal ENT Nares normal. No drainage or sinus tenderness. Lips, mucosa, and tongue normal. No Thrush Neck: Supple, symmetrical, no adenopathy, thyroid: non tender no carotid bruit and no JVD. Back: No CVA tenderness. Lungs: b/l air entry Heart: Regular rate and rhythm, no murmur, rub or gallop. Abdomen: Soft, non-tender,not distended. Bowel sounds normal. No masses Extremities: atraumatic, no cyanosis. No edema. No clubbing Skin: No rashes or lesions. Or bruising Lymph: Cervical, supraclavicular normal. Neurologic: Grossly non-focal Pertinent Labs Lab Results CBC    Component Value Date/Time   WBC 7.1 04/03/2022 2140   RBC 3.34 (L) 04/03/2022 2140   HGB 8.7 (L) 04/03/2022 2140   HGB 7.9  (L) 03/18/2022 0648   HCT 28.5 (L) 04/03/2022 2140   HCT 24.5 (L) 03/18/2022 0648   PLT 425 (H) 04/03/2022 2140   PLT 466 (H) 03/18/2022 0648   MCV 85.3 04/03/2022 2140   MCV 80 03/18/2022 0648   MCH 26.0 04/03/2022 2140   MCHC 30.5 04/03/2022 2140   RDW 19.1 (H) 04/03/2022 2140   RDW 16.1 (H) 03/18/2022 0648   LYMPHSABS 2.9 04/03/2022 2140   LYMPHSABS 1.3 03/18/2022 0648   MONOABS 1.3 (H) 04/03/2022 2140   EOSABS 0.1 04/03/2022 2140   EOSABS 0.2 03/18/2022 0648   BASOSABS 0.0 04/03/2022 2140   BASOSABS 0.0 03/18/2022 0648       Latest Ref Rng & Units 04/03/2022    9:40 PM 04/02/2022    8:45 PM 03/20/2022    6:07 AM  CMP  Glucose 70 - 99 mg/dL 98  81  96   BUN 6 - 20 mg/dL _1 Creatinine 0.61 - 1.24 mg/dL 1.14  0.99  0.81   Sodium 135 - 145 mmol/L 134  134  139   Potassium 3.5 - 5.1 mmol/L 4.0  3.6  4.0   Chloride 98 - 111 mmol/L 103  102  109   CO2 22 - 32 mmol/L _2 Calcium 8.9 - 10.3 mg/dL 8.3  8.6  8.8   Total Protein 6.5 - 8.1 g/dL 8.0  8.7  7.4   Total Bilirubin 0.3 - 1.2 mg/dL 0.6  0.7  0.4   Alkaline Phos 38 - 126 U/L 256  293  450   AST 15 - 41 U/L 33  33  37   ALT 0 - 44 U/L 16  45  27       Microbiology: Recent Results (from the past 240 hour(s))  SARS Coronavirus 2 by RT PCR (hospital order, performed in [facility] hospital lab) *cepheid single result test* Anterior Nasal Swab     Status: None   Collection Time: 04/03/22  9:21 AM   Specimen: Anterior Nasal Swab  Result Value Ref Range Status   SARS Coronavirus 2 by RT PCR NEGATIVE NEGATIVE Final    Comment: (NOTE) SARS-CoV-2 target nucleic acids are NOT DETECTED.  The SARS-CoV-2 RNA is generally detectable in upper and lower respiratory specimens during the acute phase of infection. The lowest concentration of SARS-CoV-2 viral copies this assay can detect is 250 copies / mL. A negative result does not preclude SARS-CoV-2 infection and should not be used as the sole basis for  treatment or other patient management decisions.  A negative result may occur with improper specimen collection / handling, submission of specimen other than nasopharyngeal swab, presence of viral mutation(s) within the areas targeted by this assay, and inadequate number of viral copies (<250 copies / mL). A negative result must be combined with clinical observations, patient history, and epidemiological information.  Fact Sheet for Patients:   https://www.patel.info/  Fact Sheet for Healthcare Providers: https://hall.com/  This test is not yet approved or  cleared by the [place] FDA and has been authorized for detection and/or diagnosis of SARS-CoV-2 by FDA under an Emergency Use Authorization (EUA).  This EUA will remain in effect (meaning this test can be used) for the duration of the COVID-19 declaration under Section 564(b)(1) of the Act, 21 U.S.C. section 360bbb-3(b)(1), unless the authorization is terminated or revoked sooner.  Performed at [facility], [address] [place], [place] [address]   Resp panel by RT-PCR (RSV, Flu A&B, Covid) Anterior Nasal Swab     Status: None   Collection Time: 04/03/22  9:20 PM   Specimen: Anterior Nasal Swab  Result Value Ref Range Status   SARS Coronavirus 2 by RT PCR NEGATIVE NEGATIVE Final    Comment: (NOTE) SARS-CoV-2 target nucleic acids are NOT DETECTED.  The SARS-CoV-2 RNA is generally detectable in upper respiratory specimens during the acute phase of infection. The lowest concentration of SARS-CoV-2 viral copies this assay can detect is 138 copies/mL. A negative result does not preclude SARS-Cov-2 infection and should not be used as the sole basis for treatment or other patient management decisions. A negative result may occur with  improper specimen collection/handling, submission of specimen other than nasopharyngeal swab, presence of viral mutation(s) within  the areas targeted by this assay, and inadequate number of viral copies(<138 copies/mL). A negative result must be combined with clinical observations, patient history, and epidemiological information. The expected result is Negative.  Fact Sheet for Patients:  EntrepreneurPulse.com.au  Fact Sheet for Healthcare Providers:  IncredibleEmployment.be  This test is no t yet approved or cleared by the [place] FDA and  has been authorized for detection and/or diagnosis of SARS-CoV-2 by FDA under an Emergency Use Authorization (EUA). This EUA will remain  in effect (meaning this test can be used) for the duration of the COVID-19 declaration under Section 564(b)(1) of the Act, 21 U.S.C.section 360bbb-3(b)(1), unless the authorization is terminated  or revoked sooner.       Influenza A by PCR NEGATIVE NEGATIVE Final   Influenza B by PCR NEGATIVE NEGATIVE Final    Comment: (NOTE) The Xpert Xpress SARS-CoV-2/FLU/RSV plus assay is intended as an aid in the  diagnosis of influenza from Nasopharyngeal swab specimens and should not be used as a sole basis for treatment. Nasal washings and aspirates are unacceptable for Xpert Xpress SARS-CoV-2/FLU/RSV testing.  Fact Sheet for Patients: EntrepreneurPulse.com.au  Fact Sheet for Healthcare Providers: IncredibleEmployment.be  This test is not yet approved or cleared by the [place] FDA and has been authorized for detection and/or diagnosis of SARS-CoV-2 by FDA under an Emergency Use Authorization (EUA). This EUA will remain in effect (meaning this test can be used) for the duration of the COVID-19 declaration under Section 564(b)(1) of the Act, 21 U.S.C. section 360bbb-3(b)(1), unless the authorization is terminated or revoked.     Resp Syncytial Virus by PCR NEGATIVE NEGATIVE Final    Comment: (NOTE) Fact Sheet for  Patients: EntrepreneurPulse.com.au  Fact Sheet for Healthcare Providers: IncredibleEmployment.be  This test is not yet approved or cleared by the [place] FDA and has been authorized for detection and/or diagnosis of SARS-CoV-2 by FDA under an Emergency Use Authorization (EUA). This EUA will remain in effect (meaning this test can be used) for the duration of the COVID-19 declaration under Section 564(b)(1) of the Act, 21 U.S.C. section 360bbb-3(b)(1), unless the authorization is terminated or revoked.  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 Will have to choose the HAARt regimen carefully while on the 3rd agent. 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 ? ___________________________________________________ Discussed with patient, requesting provider Note:  This document was prepared using Dragon voice recognition software and may include unintentional dictation errors.

## 2022-04-04 NOTE — ED Provider Notes (Signed)
Emergency Medicine Observation Re-evaluation Note  Michael Chandler is a 36 y.o. male, seen on rounds today.  Pt initially presented to the ED for complaints of No chief complaint on file. Currently, the patient is resting, voices no medical complaints.  Physical Exam  BP 99/61 (BP Location: Right Arm)   Pulse 94   Temp 98.1 F (36.7 C) (Oral)   Resp (!) 22   Wt 90.7 kg   SpO2 99%   BMI 23.72 kg/m  Physical Exam General: Resting in no acute distress Cardiac: No cyanosis Lungs: Equal rise and fall Psych: Not agitated  ED Course / MDM  EKG:   I have reviewed the labs performed to date as well as medications administered while in observation.  Recent changes in the last 24 hours include no events overnight.  Plan  Current plan is for psychiatric disposition.    Irean Hong, MD 04/04/22 [phone]

## 2022-04-04 NOTE — ED Notes (Signed)
Breakfast placed at bedside. 

## 2022-04-04 NOTE — Progress Notes (Signed)
Patient arrived from ER via w/c with security officer present. Patient's belongings labeled and sent back down to ER for holding.

## 2022-04-04 NOTE — Assessment & Plan Note (Signed)
Treatment as outlined in 3 

## 2022-04-04 NOTE — Consult Note (Signed)
Patient is on medical; unit, being treated for pneumonia. Writer approached patient for re=-assessment. He is laying in bed with sitter at bedside. Patient admits to using  methamphetamine prior to admission. UDS was positive for amphetamines on presentation to ED. He adamantly denies allegations that were in the IVC. He states that he was definitely not outside in his underwear, "It's cold outside, I wouldn't be there in my underwear." Patient is alert and oriented x 3. He admits to having a mental illness, but states "my mom has problems, too." He states that he is going to be moving out in January. He denies Auditory or visual hallucinations at this time; perceptions appear to be normal.  Plan is to admit him to the psychiatric inpatient unit when he is medically cleared.   Vanetta Mulders, PMHNP-BC

## 2022-04-04 NOTE — BH Assessment (Addendum)
At 6:19 Mannie Stabile called to inquire about pt's medical status. This Clinical research associate informed representative Burna Mortimer that pt is no longer medically cleared at this time per ER MD Malinda.

## 2022-04-04 NOTE — ED Provider Notes (Signed)
-----------------------------------------   7:50 AM on 04/04/2022 ----------------------------------------- I have personally seen and evaluated the patient.  He states he has been feeling somewhat more weak over the last couple days and he has had an increasing cough.  It appears yesterday the patient did spike a fever to 100.7 has been intermittently tachycardic and at times tachypneic.  Patient's chest x-ray is concerning for pneumonia.  Patient has a history of HIV immunocompromise most recent CD4 count of 80 per chart review.  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  Patient received cefepime and Zithromax yesterday.  We will dose Zithromax rifampin and ethambutol to cover for Mycobacterium avium complex.  Reassuringly patient's white blood cell count was normal with a reassuring CBC.  Patient's COVID/flu/RSV panel performed yesterday was negative.  I reviewed the patient's discharge summary from approximately 10 days ago patient was discharged with 5 days of antibiotics.  Patient states he completed the 5 days of antibiotics but began feeling weak and fatigued several days later.  Given the patient's immunocompromise status meeting SIRS criteria with signs of pneumonia on chest x-ray and positive MAC culture we will admit to the hospitalist service for continued antibiotics and treatment.   Harvest Dark, MD 04/04/22 [phone]
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
## 2022-04-04 NOTE — Assessment & Plan Note (Signed)
Patient with fever Tmax of 100.7, nonproductive cough, myalgias and fatigue Chest x-ray shows bilateral interstitial prominence with patchy airspace opacities throughout the right lung, suspicious for multifocal pneumonia. Patient is also positive for Mycobacterium avium complex We will place patient empirically on Rocephin and Zithromax Consult infectious disease

## 2022-04-04 NOTE — ED Notes (Signed)
IVC/pt admitted medically
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
## 2022-04-04 NOTE — Assessment & Plan Note (Signed)
Patient with a history of HIV/AIDS with last known CD4 count of 181 from 11/23 Continue Biktarvy

## 2022-04-05 ENCOUNTER — Other Ambulatory Visit (HOSPITAL_COMMUNITY): Payer: Self-pay

## 2022-04-05 DIAGNOSIS — A310 Pulmonary mycobacterial infection: Secondary | ICD-10-CM | POA: Diagnosis not present

## 2022-04-05 DIAGNOSIS — J189 Pneumonia, unspecified organism: Secondary | ICD-10-CM | POA: Diagnosis not present

## 2022-04-05 DIAGNOSIS — F209 Schizophrenia, unspecified: Secondary | ICD-10-CM | POA: Diagnosis not present

## 2022-04-05 DIAGNOSIS — B20 Human immunodeficiency virus [HIV] disease: Secondary | ICD-10-CM | POA: Diagnosis not present

## 2022-04-05 DIAGNOSIS — B59 Pneumocystosis: Secondary | ICD-10-CM | POA: Diagnosis not present

## 2022-04-05 DIAGNOSIS — F203 Undifferentiated schizophrenia: Secondary | ICD-10-CM | POA: Diagnosis not present

## 2022-04-05 LAB — HEPATITIS B SURFACE ANTIGEN: Hepatitis B Surface Ag: NONREACTIVE

## 2022-04-05 MED ORDER — EMTRICITABINE-TENOFOVIR DF 200-300 MG PO TABS
1.0000 | ORAL_TABLET | Freq: Every day | ORAL | Status: DC
  Administered 2022-04-06 – 2022-04-08 (×3): 1 via ORAL
  Filled 2022-04-05 (×3): qty 1

## 2022-04-05 MED ORDER — DOLUTEGRAVIR SODIUM 50 MG PO TABS
50.0000 mg | ORAL_TABLET | Freq: Every day | ORAL | Status: DC
  Administered 2022-04-06 – 2022-04-08 (×3): 50 mg via ORAL
  Filled 2022-04-05 (×4): qty 1

## 2022-04-05 MED ORDER — RIFABUTIN 150 MG PO CAPS
300.0000 mg | ORAL_CAPSULE | Freq: Every day | ORAL | Status: DC
  Administered 2022-04-05 – 2022-04-08 (×4): 300 mg via ORAL
  Filled 2022-04-05 (×4): qty 2
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
## 2022-04-05 NOTE — Progress Notes (Signed)
   Date of Admission:  04/02/2022     ID: Michael Chandler is a 36 y.o. male  Principal Problem:   Multifocal pneumonia Active Problems:   Schizophrenia, undifferentiated ([place])   Psychosis ([place])   AIDS (acquired immune deficiency syndrome) ([place])    Subjective: Patient wants to know why he is on mental watch. Otherwise no complaints  Medications:   azithromycin  500 mg Oral Daily   benztropine  1 mg Oral Daily   bictegravir-emtricitabine-tenofovir AF  1 tablet Oral Daily   dapsone  100 mg Oral Daily   enoxaparin (LOVENOX) injection  40 mg Subcutaneous Q24H   ethambutol  1,200 mg Oral Daily   folic acid  1 mg Oral Daily   haloperidol  5 mg Oral BID   mirtazapine  15 mg Oral QHS   thiamine  100 mg Oral Daily    Objective: Vital signs in last 24 hours: Temp:  [97.6 F (36.4 C)-100 F (37.8 C)] 97.9 F (36.6 C) (12/13 1148) Pulse Rate:  [76-104] 83 (12/13 1148) Resp:  [16-20] 16 (12/13 1148) BP: (101-129)/(51-85) 124/85 (12/13 1148) SpO2:  [100 %] 100 % (12/13 1148)   PHYSICAL EXAM:  General: In bed under covers.  On calling his name he wakes up and has a normal conversation.  Head: Normocephalic, without obvious abnormality, atraumatic. Eyes: Conjunctivae clear, anicteric sclerae. Pupils are equal ENT Nares normal. No drainage or sinus tenderness. Lips, mucosa, and tongue normal. No Thrush Neck: Supple, symmetrical, no adenopathy, thyroid: non tender no carotid bruit and no JVD. Back: No CVA tenderness. Lungs: Clear to auscultation bilaterally. No Wheezing or Rhonchi. No rales. Heart: Regular rate and rhythm, no murmur, rub or gallop. Abdomen: Soft, non-tender,not distended. Bowel sounds normal. No masses Extremities: atraumatic, no cyanosis. No edema. No clubbing Skin: No rashes or lesions. Or bruising Lymph: Cervical, supraclavicular normal. Neurologic: Grossly non-focal  Lab Results Recent Labs    04/02/22 2045 04/03/22 2140  WBC 11.4* 7.1  HGB 8.5* 8.7*   HCT 27.9* 28.5*  NA 134* 134*  K 3.6 4.0  CL 102 103  CO2 24 23  BUN 11 13  CREATININE 0.99 1.14   Liver Panel Recent Labs    04/02/22 2045 04/03/22 2140  PROT 8.7* 8.0  ALBUMIN 3.1* 2.6*  AST 33 33  ALT 18 16  ALKPHOS 293* 256*  BILITOT 0.7 0.6   Sedimentation Rate No results for input(s): "ESRSEDRATE" in the last 72 hours. C-Reactive Protein No results for input(s): "CRP" in the last 72 hours.  Microbiology:  Studies/Results: DG Chest Portable 1 View  Result Date: 04/03/2022 CLINICAL DATA:  Tachycardia, fever EXAM: PORTABLE CHEST 1 VIEW COMPARISON:  03/15/2022 FINDINGS: The heart size and mediastinal contours are within normal limits. Bilateral interstitial prominence with patchy airspace opacities throughout the right lung. No pleural effusion or pneumothorax. The visualized skeletal structures are unremarkable. IMPRESSION: Bilateral interstitial prominence with patchy airspace opacities throughout the right lung, suspicious for multifocal pneumonia. Electronically Signed   By: Davina Poke D.O.   On: 04/03/2022 20:57     Assessment/Plan: AIDS currently on Biktarvy.  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  Will add rifabutin.  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  DNA negative.  Will repeat the test. Schizophrenia Discussed the management with the care team.

## 2022-04-05 NOTE — TOC CM/SW Note (Signed)
  Transition of Care [facility]) Screening Note   Patient Details  Name: Michael Chandler Date of Birth: 1986/03/06   Transition of Care [facility] CM/SW Contact:    Tempie Hoist, LCSWA Phone Number: 04/05/2022, 3:19 PM    Transition of Care Department [facility]) has reviewed patient and no TOC needs have been identified at this time. We will continue to monitor patient advancement through interdisciplinary progression rounds. If new patient transition needs arise, please place a TOC consult.

## 2022-04-06 ENCOUNTER — Other Ambulatory Visit (HOSPITAL_COMMUNITY): Payer: Self-pay

## 2022-04-06 DIAGNOSIS — F203 Undifferentiated schizophrenia: Secondary | ICD-10-CM | POA: Diagnosis not present

## 2022-04-06 DIAGNOSIS — J189 Pneumonia, unspecified organism: Secondary | ICD-10-CM | POA: Diagnosis not present

## 2022-04-06 DIAGNOSIS — B20 Human immunodeficiency virus [HIV] disease: Secondary | ICD-10-CM | POA: Diagnosis not present

## 2022-04-06 LAB — COMPREHENSIVE METABOLIC PANEL
ALT: 13 U/L (ref 0–44)
AST: 27 U/L (ref 15–41)
Albumin: 2.5 g/dL — ABNORMAL LOW (ref 3.5–5.0)
Alkaline Phosphatase: 223 U/L — ABNORMAL HIGH (ref 38–126)
Anion gap: 7 (ref 5–15)
BUN: 10 mg/dL (ref 6–20)
CO2: 23 mmol/L (ref 22–32)
Calcium: 8.1 mg/dL — ABNORMAL LOW (ref 8.9–10.3)
Chloride: 103 mmol/L (ref 98–111)
Creatinine, Ser: 1.02 mg/dL (ref 0.61–1.24)
GFR, Estimated: >60 mL/min (ref >60)
Glucose, Bld: 112 mg/dL — ABNORMAL HIGH (ref 70–99)
Potassium: 3.9 mmol/L (ref 3.5–5.1)
Sodium: 133 mmol/L — ABNORMAL LOW (ref 135–145)
Total Bilirubin: 0.5 mg/dL (ref 0.3–1.2)
Total Protein: 7.6 g/dL (ref 6.5–8.1)

## 2022-04-06 LAB — CBC
HCT: 27.3 % — ABNORMAL LOW (ref 39.0–52.0)
Hemoglobin: 8.4 g/dL — ABNORMAL LOW (ref 13.0–17.0)
MCH: 25.8 pg — ABNORMAL LOW (ref 26.0–34.0)
MCHC: 30.8 g/dL (ref 30.0–36.0)
MCV: 83.7 fL (ref 80.0–100.0)
Platelets: 443 10*3/uL — ABNORMAL HIGH (ref 150–400)
RBC: 3.26 MIL/uL — ABNORMAL LOW (ref 4.22–5.81)
RDW: 18.3 % — ABNORMAL HIGH (ref 11.5–15.5)
WBC: 7.9 10*3/uL (ref 4.0–10.5)
nRBC: 0 % (ref 0.0–0.2)

## 2022-04-06 LAB — HEPATITIS B E ANTIBODY: Hep B E Ab: NEGATIVE

## 2022-04-06 LAB — HEPATITIS B DNA, ULTRAQUANTITATIVE, PCR
HBV DNA SERPL PCR-ACNC: NOT DETECTED IU/mL
HBV DNA SERPL PCR-LOG IU: UNDETERMINED log10 IU/mL

## 2022-04-06 LAB — HEPATITIS B SURFACE ANTIBODY, QUANTITATIVE: Hep B S AB Quant (Post): 102.3 m[IU]/mL (ref >9.9)

## 2022-04-06 LAB — HEPATITIS B E ANTIGEN: Hep B E Ag: NEGATIVE

## 2022-04-06 NOTE — TOC Benefit Eligibility Note (Signed)
Patient Product/process development scientist completed.    The patient is currently admitted and upon discharge could be taking rifabutin 150 mg.  The current 30 day co-pay is $1.45.   The patient is currently admitted and upon discharge could be taking ethambutol 400 mg.  The current 30 day co-pay is $1.45.   The patient is currently admitted and upon discharge could be taking emtricitabine-tenofovir 200-300 MG tablet.  The current 30 day co-pay is $1.45.   The patient is currently admitted and upon discharge could be taking Tivicay tablet.  The current 30 day co-pay is $4.30.   The patient is currently admitted and upon discharge could be taking Biktarvy tablet.  The current 30 day co-pay is $4.30.   The patient is currently admitted and upon discharge could be taking azithromycin 500 mg tablet.  The current 30 day co-pay is $0.00.   The patient is insured through [employer] Medicare Part D   Roland Earl, CPHT Pharmacy Patient Advocate Specialist [facility] Patient Advocate Team Direct Number: [phone]  Fax: [phone]

## 2022-04-06 NOTE — Progress Notes (Signed)
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 Continue on IV azithromycin, ethambutol and rifabutin as per ID. ID following and recs apprec    AIDS: w/ last known CD4 count of 181 from 11/23. Biktarvy was d/c and pt started on truvada, dolutegravir as per ID. ID following and recs apprec   Schizophrenia: undifferentiated. Presented to the ER under IVC papers for threatening his mother and sister and erratic behavior. Per mother he had been noncompliant with prescribed medications. Continue on haldol, benztropine, hydroxyzine, & mirtazapine. Will be admitted to inpatient psych once medically cleared   Normocytic anemia: H&H are labile. No need for a transfusion currently   Thrombocytosis: etiology unclear. Will continue to monitor     DVT prophylaxis:  lovenox Code Status: full  Family Communication:  Disposition Plan: likely d/c to inpatient psych at [facility]  Level of care: Telemetry Medical  Status is: Inpatient Remains inpatient appropriate because: severity of illness    Consultants:  ID  Procedures:   Antimicrobials: azithromycin ethambutol    Subjective: Pt c/o fatigue   Objective: Vitals:   04/05/22 1638 04/05/22 1953 04/05/22 2349 04/06/22 0415  BP: (!) 106/59 116/71 117/76 113/62  Pulse: 86 98 99 (!) 110  Resp: _0 Temp: 97.7 F (36.5 C) 98.7 F (37.1 C) 100 F (37.8 C) 98.4 F (36.9 C)  TempSrc:      SpO2: 100% 100% 98% 100%  Weight:      Height:        Intake/Output Summary (Last 24 hours) at 04/06/2022 0741 Last data filed at 04/05/2022 1830 Gross per 24  hour  Intake 240 ml  Output 2725 ml  Net -2485 ml   Filed Weights   04/02/22 2041  Weight: 90.7 kg    Examination:  General exam: Appears comfortable  Respiratory system: decreased breath sounds b/l  Cardiovascular system: S1/S2+. No rubs or clicks  Gastrointestinal system: Abd is soft, NT, ND & normal bowel sounds  Central nervous system: Alert and awake. Moves all extremities  Psychiatry: Judgement and insight appears near baseline. Flat mood and affect     Data Reviewed: I have personally reviewed following labs and imaging studies  CBC: Recent Labs  Lab 04/02/22 2045 04/03/22 2140 04/06/22 0255  WBC 11.4* 7.1 7.9  NEUTROABS  --  2.8  --   HGB 8.5* 8.7* 8.4*  HCT 27.9* 28.5* 27.3*  MCV 85.8 85.3 83.7  PLT 485* 425* 353*   Basic Metabolic Panel: Recent Labs  Lab 04/02/22 2045 04/03/22 2140 04/06/22 0255  NA 134* 134* 133*  K 3.6 4.0 3.9  CL 102 103 103  CO2 _1 GLUCOSE 81 98 112*  BUN _2 CREATININE 0.99 1.14 1.02  CALCIUM 8.6* 8.3* 8.1*   GFR: Estimated Creatinine Clearance: 128.6 mL/min (by C-G formula based on SCr of 1.02 mg/dL). Liver Function Tests: Recent Labs  Lab 04/02/22 2045 04/03/22 2140 04/06/22 0255  AST 33 33 27  ALT _3 ALKPHOS 293* 256* 223*  BILITOT 0.7 0.6 0.5  PROT 8.7* 8.0 7.6  ALBUMIN 3.1* 2.6* 2.5*  No results for input(s): "LIPASE", "AMYLASE" in the last 168 hours. No results for input(s): "AMMONIA" in the last 168 hours. Coagulation Profile: No results for input(s): "INR", "PROTIME" in the last 168 hours. Cardiac Enzymes: No results for input(s): "CKTOTAL", "CKMB", "CKMBINDEX", "TROPONINI" in the last 168 hours. BNP (last 3 results) No results for input(s): "PROBNP" in the last 8760 hours. HbA1C: No results for input(s): "HGBA1C" in the last 72 hours. CBG: No results for input(s): "GLUCAP" in the last 168 hours. Lipid Profile: No results for input(s): "CHOL", "HDL", "LDLCALC", "TRIG",  "CHOLHDL", "LDLDIRECT" in the last 72 hours. Thyroid Function Tests: No results for input(s): "TSH", "T4TOTAL", "FREET4", "T3FREE", "THYROIDAB" in the last 72 hours. Anemia Panel: No results for input(s): "VITAMINB12", "FOLATE", "FERRITIN", "TIBC", "IRON", "RETICCTPCT" in the last 72 hours. Sepsis Labs: Recent Labs  Lab 04/03/22 2140  LATICACIDVEN 1.2    Recent Results (from the past 240 hour(s))  SARS Coronavirus 2 by RT PCR (hospital order, performed in [facility] hospital lab) *cepheid single result test* Anterior Nasal Swab     Status: None   Collection Time: 04/03/22  9:21 AM   Specimen: Anterior Nasal Swab  Result Value Ref Range Status   SARS Coronavirus 2 by RT PCR NEGATIVE NEGATIVE Final    Comment: (NOTE) SARS-CoV-2 target nucleic acids are NOT DETECTED.  The SARS-CoV-2 RNA is generally detectable in upper and lower respiratory specimens during the acute phase of infection. The lowest concentration of SARS-CoV-2 viral copies this assay can detect is 250 copies / mL. A negative result does not preclude SARS-CoV-2 infection and should not be used as the sole basis for treatment or other patient management decisions.  A negative result may occur with improper specimen collection / handling, submission of specimen other than nasopharyngeal swab, presence of viral mutation(s) within the areas targeted by this assay, and inadequate number of viral copies (<250 copies / mL). A negative result must be combined with clinical observations, patient history, and epidemiological information.  Fact Sheet for Patients:   https://www.patel.info/  Fact Sheet for Healthcare Providers: https://hall.com/  This test is not yet approved or  cleared by the [place] FDA and has been authorized for detection and/or diagnosis of SARS-CoV-2 by FDA under an Emergency Use Authorization (EUA).  This EUA will remain in effect (meaning this test can  be used) for the duration of the COVID-19 declaration under Section 564(b)(1) of the Act, 21 U.S.C. section 360bbb-3(b)(1), unless the authorization is terminated or revoked sooner.  Performed at [facility], [address] [place], [place] [address]   Resp panel by RT-PCR (RSV, Flu A&B, Covid) Anterior Nasal Swab     Status: None   Collection Time: 04/03/22  9:20 PM   Specimen: Anterior Nasal Swab  Result Value Ref Range Status   SARS Coronavirus 2 by RT PCR NEGATIVE NEGATIVE Final    Comment: (NOTE) SARS-CoV-2 target nucleic acids are NOT DETECTED.  The SARS-CoV-2 RNA is generally detectable in upper respiratory specimens during the acute phase of infection. The lowest concentration of SARS-CoV-2 viral copies this assay can detect is 138 copies/mL. A negative result does not preclude SARS-Cov-2 infection and should not be used as the sole basis for treatment or other patient management decisions. A negative result may occur with  improper specimen collection/handling, submission of specimen other than nasopharyngeal swab, presence of viral mutation(s) within the areas targeted by this assay, and inadequate number of viral copies(<138 copies/mL). A negative result must be combined  with clinical observations, patient history, and epidemiological information. The expected result is Negative.  Fact Sheet for Patients:  EntrepreneurPulse.com.au  Fact Sheet for Healthcare Providers:  IncredibleEmployment.be  This test is no t yet approved or cleared by the [place] FDA and  has been authorized for detection and/or diagnosis of SARS-CoV-2 by FDA under an Emergency Use Authorization (EUA). This EUA will remain  in effect (meaning this test can be used) for the duration of the COVID-19 declaration under Section 564(b)(1) of the Act, 21 U.S.C.section 360bbb-3(b)(1), unless the authorization is terminated  or revoked sooner.        Influenza A by PCR NEGATIVE NEGATIVE Final   Influenza B by PCR NEGATIVE NEGATIVE Final    Comment: (NOTE) The Xpert Xpress SARS-CoV-2/FLU/RSV plus assay is intended as an aid in the diagnosis of influenza from Nasopharyngeal swab specimens and should not be used as a sole basis for treatment. Nasal washings and aspirates are unacceptable for Xpert Xpress SARS-CoV-2/FLU/RSV testing.  Fact Sheet for Patients: EntrepreneurPulse.com.au  Fact Sheet for Healthcare Providers: IncredibleEmployment.be  This test is not yet approved or cleared by the [place] FDA and has been authorized for detection and/or diagnosis of SARS-CoV-2 by FDA under an Emergency Use Authorization (EUA). This EUA will remain in effect (meaning this test can be used) for the duration of the COVID-19 declaration under Section 564(b)(1) of the Act, 21 U.S.C. section 360bbb-3(b)(1), unless the authorization is terminated or revoked.     Resp Syncytial Virus by PCR NEGATIVE NEGATIVE Final    Comment: (NOTE) Fact Sheet for Patients: EntrepreneurPulse.com.au  Fact Sheet for Healthcare Providers: IncredibleEmployment.be  This test is not yet approved or cleared by the [place] FDA and has been authorized for detection and/or diagnosis of SARS-CoV-2 by FDA under an Emergency Use Authorization (EUA). This EUA will remain in effect (meaning this test can be used) for the duration of the COVID-19 declaration under Section 564(b)(1) of the Act, 21 U.S.C. section 360bbb-3(b)(1), unless the authorization is terminated or revoked.  Performed at [facility], [address] [place], [place] [address]          Radiology Studies: No results found.      Scheduled Meds:  azithromycin  500 mg Oral Daily   benztropine  1 mg Oral Daily   dapsone  100 mg Oral Daily   emtricitabine-tenofovir  1 tablet Oral Daily   And    dolutegravir  50 mg Oral Daily   enoxaparin (LOVENOX) injection  40 mg Subcutaneous Q24H   ethambutol  1,200 mg Oral Daily   folic acid  1 mg Oral Daily   haloperidol  5 mg Oral BID   mirtazapine  15 mg Oral QHS   rifabutin  300 mg Oral Daily   thiamine  100 mg Oral Daily   Continuous Infusions:  lactated ringers 75 mL/hr at 04/05/22 1830     LOS: 2 days    Time spent: 30 mins    Wyvonnia Dusky, MD [facility] Pager [phone]  If 7PM-7AM, please contact night-coverage [email or web address] 04/06/2022, 7:41 AM

## 2022-04-06 NOTE — Consult Note (Signed)
1700: Patient is sleeping soundly. Bedside sitter states that she has been there for about an hour and the sitter that she relieved said he has been sleeping most of the day. No mention of agitation or hallucinations. Writer has reached out to patient's bedside RN, Alejandro Mulling,  via  phone to ask if there have been any concerning behaviors from patient. Stanton Kidney states that there have been no concerning behavior, no evidence of hallucinations. He has been sleeping most of the day. It appears that patient 's psychiatric symptoms may be improving to baseline. Patient should be re-assessed tomorrow to determine if patient continues to require inpatient psychiatric admission when medically cleared. Of note, patient's UDS was positive for amphetamines on presentation to the ED, which was likely a factor in his symptomatology. Patient has been taking his medication as prescribed.   Vanetta Mulders, PMHNP-BC

## 2022-04-07 DIAGNOSIS — J189 Pneumonia, unspecified organism: Secondary | ICD-10-CM | POA: Diagnosis not present

## 2022-04-07 DIAGNOSIS — B20 Human immunodeficiency virus [HIV] disease: Secondary | ICD-10-CM | POA: Diagnosis not present

## 2022-04-07 DIAGNOSIS — B59 Pneumocystosis: Secondary | ICD-10-CM | POA: Diagnosis not present

## 2022-04-07 DIAGNOSIS — A310 Pulmonary mycobacterial infection: Secondary | ICD-10-CM | POA: Diagnosis not present

## 2022-04-07 DIAGNOSIS — F203 Undifferentiated schizophrenia: Secondary | ICD-10-CM | POA: Diagnosis not present

## 2022-04-07 LAB — COMPREHENSIVE METABOLIC PANEL
ALT: 13 U/L (ref 0–44)
AST: 23 U/L (ref 15–41)
Albumin: 2.6 g/dL — ABNORMAL LOW (ref 3.5–5.0)
Alkaline Phosphatase: 219 U/L — ABNORMAL HIGH (ref 38–126)
Anion gap: 3 — ABNORMAL LOW (ref 5–15)
BUN: 8 mg/dL (ref 6–20)
CO2: 25 mmol/L (ref 22–32)
Calcium: 8.7 mg/dL — ABNORMAL LOW (ref 8.9–10.3)
Chloride: 106 mmol/L (ref 98–111)
Creatinine, Ser: 0.91 mg/dL (ref 0.61–1.24)
GFR, Estimated: >60 mL/min (ref >60)
Glucose, Bld: 89 mg/dL (ref 70–99)
Potassium: 4.2 mmol/L (ref 3.5–5.1)
Sodium: 134 mmol/L — ABNORMAL LOW (ref 135–145)
Total Bilirubin: 0.3 mg/dL (ref 0.3–1.2)
Total Protein: 8.2 g/dL — ABNORMAL HIGH (ref 6.5–8.1)

## 2022-04-07 LAB — CBC
HCT: 28.5 % — ABNORMAL LOW (ref 39.0–52.0)
Hemoglobin: 8.8 g/dL — ABNORMAL LOW (ref 13.0–17.0)
MCH: 25.6 pg — ABNORMAL LOW (ref 26.0–34.0)
MCHC: 30.9 g/dL (ref 30.0–36.0)
MCV: 82.8 fL (ref 80.0–100.0)
Platelets: 449 10*3/uL — ABNORMAL HIGH (ref 150–400)
RBC: 3.44 MIL/uL — ABNORMAL LOW (ref 4.22–5.81)
RDW: 18.1 % — ABNORMAL HIGH (ref 11.5–15.5)
WBC: 9.3 10*3/uL (ref 4.0–10.5)
nRBC: 0 % (ref 0.0–0.2)

## 2022-04-07 MED ORDER — ATOVAQUONE 750 MG/5ML PO SUSP
750.0000 mg | Freq: Two times a day (BID) | ORAL | Status: DC
  Administered 2022-04-08: 750 mg via ORAL
  Filled 2022-04-07 (×2): qty 5

## 2022-04-07 NOTE — Progress Notes (Signed)
Date of Admission:  04/02/2022     ID: Michael Chandler is a 36 y.o. 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[place])    Subjective: Pt is doing better No fever No sob No cough  Medications:   azithromycin  500 mg Oral Daily   benztropine  1 mg Oral Daily   dapsone  100 mg Oral Daily   emtricitabine-tenofovir  1 tablet Oral Daily   And   dolutegravir  50 mg Oral Daily   enoxaparin (LOVENOX) injection  40 mg Subcutaneous Q24H   ethambutol  1,200 mg Oral Daily   folic acid  1 mg Oral Daily   haloperidol  5 mg Oral BID   mirtazapine  15 mg Oral QHS   rifabutin  300 mg Oral Daily   thiamine  100 mg Oral Daily    Objective: Vital signs in last 24 hours: Temp:  [97.2 F (36.2 C)-98.9 F (37.2 C)] 98.6 F (37 C) (12/15 0713) Pulse Rate:  [96-106] 96 (12/15 0713) Resp:  [16-19] 18 (12/15 0713) BP: (108-125)/(59-75) 108/59 (12/15 0713) SpO2:  [97 %-100 %] 99 % (12/15 0713)   PHYSICAL EXAM:  General:awake and alert- No distress Eyes: Conjunctivae clear, anicteric sclerae. Pupils are equal ENT Nares normal. No drainage or sinus tenderness. Lips, mucosa, and tongue normal. No Thrush Neck: Supple, symmetrical, no adenopathy, thyroid: non tender no carotid bruit and no JVD. Back: No CVA tenderness. Lungs: Clear to auscultation bilaterally. No Wheezing or Rhonchi. No rales. Heart: Regular rate and rhythm, no murmur, rub or gallop. Abdomen: Soft, non-tender,not distended. Bowel sounds normal. No masses Extremities: atraumatic, no cyanosis. No edema. No clubbing Skin: No rashes or lesions. Or bruising Lymph: cervical lymphnode on the left side smaller Neurologic: Grossly non-focal  Lab Results Recent Labs    04/06/22 0255 04/07/22 0629  WBC 7.9 9.3  HGB 8.4* 8.8*  HCT 27.3* 28.5*   NA 133* 134*  K 3.9 4.2  CL 103 106  CO2 23 25  BUN 10 8  CREATININE 1.02 0.91   Liver Panel Recent Labs    04/06/22 0255 04/07/22 0629  PROT 7.6 8.2*  ALBUMIN 2.5* 2.6*  AST 27 23  ALT 13 13  ALKPHOS 223* 219*  BILITOT 0.5 0.3  Microbiology:  Studies/Results: CXR b/l infiltrate   Assessment/Plan: AIDS on  Truvada and dolutegravir because of Mycobacterium AVIUM therapy biktarvy cannot be given  Mycobacterium avium intracellulare of the lymph nodes Likely dissemination He is currently on azithromycin 580m QD and ethambutol 12045mQD. and rifabutin 30052mD.  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  IF he is going home this weekend Then his meds are the following Truvada ( 200-300mg43m tablet a day Tivicay 50mg 75m a day Ethambutol 1200mg a47m ( 400mg X368mithromycin 500mg a d4mifabutin 300mg a da56m150mg x2) A8mquone 750 mg Q 12 Please make sure that all these meds are available from  the outside pharmacy and he is able to get them over the weekend Also our  pharmacist will have to educate him on side effects, adherence .  Appt made for 04/20/22 with me at 9.45 am ID will follow him peripherally this weekend. Call if needed

## 2022-04-07 NOTE — Consult Note (Signed)
[facility] Face-to-Face Psychiatry Consult   Reason for Consult: Consult for this 36 year old man with a history of psychotic disorder and substance abuse.  Currently in the hospital recovering from pneumonia. Referring Physician:  Sherryll Burger Patient Identification: Michael Chandler MRN:  [id number] Principal Diagnosis: Multifocal pneumonia Diagnosis:  Principal Problem:   Multifocal pneumonia Active Problems:   Schizophrenia, undifferentiated (HCC)   Psychosis (HCC)   AIDS (acquired immune deficiency syndrome) (HCC)   MAI (mycobacterium avium-intracellulare) infection (HCC)   Total Time spent with patient: 30 minutes  Subjective:   Michael Chandler is a 36 y.o. male patient admitted with "I am feeling pretty good".  HPI: Patient seen and chart reviewed.  Patient known from previous encounters.  36 year old man with a history of substance abuse primarily with methamphetamine as well as possible psychotic disorder on top of history of being HIV positive.  Patient presented to the hospital with confusion and hallucinations with reports of inappropriate behavior like standing outside in his underwear.  On reevaluation today the patient denies any hallucinations.  He is alert and oriented.  Makes good eye contact.  Euthymic affect.  States his mood is feeling fine.  Denies suicidal or homicidal ideation.  Able to articulate a basic plan for self-care going forward.  Patient admits that he had been using amphetamines again recently.  Past Psychiatric History: Past history of presentation with psychotic symptoms usually in the context of drug abuse.  Schizophrenia diagnosis remains on chart although it is not clear whether he really has a psychotic disorder outside of substance use problems  Risk to Self:   Risk to Others:   Prior Inpatient Therapy:   Prior Outpatient Therapy:    Past Medical History:  Past Medical History:  Diagnosis Date   HIV (human immunodeficiency virus infection) (HCC)    Schizophrenia  (HCC)    per IVC paperwork, pt states does not have this dx    Past Surgical History:  Procedure Laterality Date   IR US GUIDE BX ASP/DRAIN  03/21/2022   Family History: No family history on file. Family Psychiatric  History: See previous Social History:  Social History   Substance and Sexual Activity  Alcohol Use Yes   Comment: occasional     Social History   Substance and Sexual Activity  Drug Use Yes   Types: Methamphetamines, Marijuana, Cocaine   Comment: states occasional marijuana and meth use    Social History   Socioeconomic History   Marital status: Single    Spouse name: Not on file   Number of children: Not on file   Years of education: Not on file   Highest education level: Not on file  Occupational History   Not on file  Tobacco Use   Smoking status: Former    Types: Cigarettes   Smokeless tobacco: Never   Tobacco comments:    Patient denies using tobacco products (03/13/22)  Substance and Sexual Activity   Alcohol use: Yes    Comment: occasional   Drug use: Yes    Types: Methamphetamines, Marijuana, Cocaine    Comment: states occasional marijuana and meth use   Sexual activity: Not Currently    Comment: condoms accepted  Other Topics Concern   Not on file  Social History Narrative   Not on file   Social Determinants of Health   Financial Resource Strain: Not on file  Food Insecurity: No Food Insecurity (03/16/2022)   Hunger Vital Sign    Worried About Running Out of Food in the Last  Year: Never true    Ran Out of Food in the Last Year: Never true  Recent Concern: Food Insecurity - Food Insecurity Present (03/02/2022)   Hunger Vital Sign    Worried About Running Out of Food in the Last Year: Not on file    Ran Out of Food in the Last Year: Sometimes true  Transportation Needs: No Transportation Needs (03/16/2022)   PRAPARE - Administrator, Civil ServiceTransportation    Lack of Transportation (Medical): No    Lack of Transportation (Non-Medical): No  Physical  Activity: Not on file  Stress: Not on file  Social Connections: Not on file   Additional Social History:    Allergies:   Allergies  Allergen Reactions   Bactrim [Sulfamethoxazole-Trimethoprim] Anaphylaxis    Labs:  Results for orders placed or performed during the hospital encounter of 04/02/22 (from the past 48 hour(s))  CBC     Status: Abnormal   Collection Time: 04/06/22  2:55 AM  Result Value Ref Range   WBC 7.9 4.0 - 10.5 K/uL   RBC 3.26 (L) 4.22 - 5.81 MIL/uL   Hemoglobin 8.4 (L) 13.0 - 17.0 g/dL   HCT 40.927.3 (L) 81.139.0 - 91.452.0 %   MCV 83.7 80.0 - 100.0 fL   MCH 25.8 (L) 26.0 - 34.0 pg   MCHC 30.8 30.0 - 36.0 g/dL   RDW 78.218.3 (H) 95.611.5 - 21.315.5 %   Platelets 443 (H) 150 - 400 K/uL   nRBC 0.0 0.0 - 0.2 %    Comment: Performed at [facility], [address] [place], [place] [address]  Comprehensive metabolic panel     Status: Abnormal   Collection Time: 04/06/22  2:55 AM  Result Value Ref Range   Sodium 133 (L) 135 - 145 mmol/L   Potassium 3.9 3.5 - 5.1 mmol/L   Chloride 103 98 - 111 mmol/L   CO2 23 22 - 32 mmol/L   Glucose, Bld 112 (H) 70 - 99 mg/dL    Comment: Glucose reference range applies only to samples taken after fasting for at least 8 hours.   BUN 10 6 - 20 mg/dL   Creatinine, Ser 8.461.02 0.61 - 1.24 mg/dL   Calcium 8.1 (L) 8.9 - 10.3 mg/dL   Total Protein 7.6 6.5 - 8.1 g/dL   Albumin 2.5 (L) 3.5 - 5.0 g/dL   AST 27 15 - 41 U/L   ALT 13 0 - 44 U/L   Alkaline Phosphatase 223 (H) 38 - 126 U/L   Total Bilirubin 0.5 0.3 - 1.2 mg/dL   GFR, Estimated >96>60 >29>60 mL/min    Comment: (NOTE) Calculated using the CKD-EPI Creatinine Equation (2021)    Anion gap 7 5 - 15    Comment: Performed at [facility], [address] [place], [place] [address]  CBC     Status: Abnormal   Collection Time: 04/07/22  6:29 AM  Result Value Ref Range   WBC 9.3 4.0 - 10.5 K/uL   RBC 3.44 (L) 4.22 - 5.81 MIL/uL   Hemoglobin 8.8 (L) 13.0 - 17.0 g/dL   HCT 32.428.5 (L) 40.139.0  - 52.0 %   MCV 82.8 80.0 - 100.0 fL   MCH 25.6 (L) 26.0 - 34.0 pg   MCHC 30.9 30.0 - 36.0 g/dL   RDW 02.718.1 (H) 25.311.5 - 66.415.5 %   Platelets 449 (H) 150 - 400 K/uL   nRBC 0.0 0.0 - 0.2 %    Comment: Performed at [facility], [address]  [place], [place] [address]  Comprehensive metabolic panel     Status: Abnormal   Collection Time: 04/07/22  6:29 AM  Result Value Ref Range   Sodium 134 (L) 135 - 145 mmol/L   Potassium 4.2 3.5 - 5.1 mmol/L   Chloride 106 98 - 111 mmol/L   CO2 25 22 - 32 mmol/L   Glucose, Bld 89 70 - 99 mg/dL    Comment: Glucose reference range applies only to samples taken after fasting for at least 8 hours.   BUN 8 6 - 20 mg/dL   Creatinine, Ser 9.37 0.61 - 1.24 mg/dL   Calcium 8.7 (L) 8.9 - 10.3 mg/dL   Total Protein 8.2 (H) 6.5 - 8.1 g/dL   Albumin 2.6 (L) 3.5 - 5.0 g/dL   AST 23 15 - 41 U/L   ALT 13 0 - 44 U/L   Alkaline Phosphatase 219 (H) 38 - 126 U/L   Total Bilirubin 0.3 0.3 - 1.2 mg/dL   GFR, Estimated >16 >96 mL/min    Comment: (NOTE) Calculated using the CKD-EPI Creatinine Equation (2021)    Anion gap 3 (L) 5 - 15    Comment: Performed at [facility], [address] [place], [place] [address]    Current Facility-Administered Medications  Medication Dose Route Frequency Provider Last Rate Last Admin   azithromycin (ZITHROMAX) tablet 500 mg  500 mg Oral Daily Ravishankar, Rhodia Albright, MD   500 mg at 04/07/22 1056   benztropine (COGENTIN) tablet 1 mg  1 mg Oral Daily Gabriel Cirri F, NP   1 mg at 04/07/22 1056   dapsone tablet 100 mg  100 mg Oral Daily Chesley Noon, MD   100 mg at 04/07/22 1055   emtricitabine-tenofovir (TRUVADA) 200-300 MG per tablet 1 tablet  1 tablet Oral Daily Lynn Ito, MD   1 tablet at 04/07/22 1055   And   dolutegravir (TIVICAY) tablet 50 mg  50 mg Oral Daily Ravishankar, Rhodia Albright, MD   50 mg at 04/06/22 1607   enoxaparin (LOVENOX) injection 40 mg  40 mg Subcutaneous Q24H Agbata,  Tochukwu, MD   40 mg at 04/06/22 1752   ethambutol (MYAMBUTOL) tablet 1,200 mg  1,200 mg Oral Daily Lynn Ito, MD   1,200 mg at 04/06/22 1017   folic acid (FOLVITE) tablet 1 mg  1 mg Oral Daily Chesley Noon, MD   1 mg at 04/07/22 1056   haloperidol (HALDOL) tablet 5 mg  5 mg Oral BID Gabriel Cirri F, NP   5 mg at 04/07/22 1055   hydrOXYzine (ATARAX) tablet 25 mg  25 mg Oral TID PRN Vanetta Mulders, NP   25 mg at 04/06/22 2209   mirtazapine (REMERON SOL-TAB) disintegrating tablet 15 mg  15 mg Oral QHS Chesley Noon, MD   15 mg at 04/06/22 2208   rifabutin (MYCOBUTIN) capsule 300 mg  300 mg Oral Daily Lynn Ito, MD   300 mg at 04/07/22 1054   thiamine (VITAMIN B1) tablet 100 mg  100 mg Oral Daily Chesley Noon, MD   100 mg at 04/07/22 1055    Musculoskeletal: Strength & Muscle Tone: within normal limits Gait & Station: normal Patient leans: N/A            Psychiatric Specialty Exam:  Presentation  General Appearance:  Appropriate for Environment  Eye Contact: Fair  Speech: Clear and Coherent  Speech Volume: Normal  Handedness: Right   Mood and Affect  Mood: Dysphoric  Affect: Congruent   Thought Process  Thought Processes: Disorganized; Irrevelant  Descriptions of Associations:Tangential  Orientation:Full (Time, Place and Person)  Thought Content:Illogical  History of Schizophrenia/Schizoaffective disorder:Yes  Duration of Psychotic Symptoms:Greater than six months  Hallucinations:No data recorded Ideas of Reference:Paranoia; Delusions  Suicidal Thoughts:No data recorded Homicidal Thoughts:No data recorded  Sensorium  Memory: Immediate Poor; Remote Poor  Judgment: Impaired  Insight: None   Executive Functions  Concentration: Fair  Attention Span: Fair  Recall: [employer] of Knowledge: Fair  Language: Fair   Psychomotor Activity  Psychomotor Activity:No data recorded  Assets   Assets: Financial Resources/Insurance; Housing; Social Support   Sleep  Sleep:No data recorded  Physical Exam: Physical Exam Vitals and nursing note reviewed.  Constitutional:      Appearance: Normal appearance.  HENT:     Head: Normocephalic and atraumatic.     Mouth/Throat:     Pharynx: Oropharynx is clear.  Eyes:     Pupils: Pupils are equal, round, and reactive to light.  Cardiovascular:     Rate and Rhythm: Normal rate and regular rhythm.  Pulmonary:     Effort: Pulmonary effort is normal.     Breath sounds: Normal breath sounds.  Abdominal:     General: Abdomen is flat.     Palpations: Abdomen is soft.  Musculoskeletal:        General: Normal range of motion.  Skin:    General: Skin is warm and dry.  Neurological:     General: No focal deficit present.     Mental Status: He is alert. Mental status is at baseline.  Psychiatric:        Attention and Perception: Attention normal.        Mood and Affect: Mood normal.        Speech: Speech normal.        Behavior: Behavior is cooperative.        Thought Content: Thought content normal.        Cognition and Memory: Cognition normal.    Review of Systems  Constitutional: Negative.   HENT: Negative.    Eyes: Negative.   Respiratory: Negative.    Cardiovascular: Negative.   Gastrointestinal: Negative.   Musculoskeletal: Negative.   Skin: Negative.   Neurological: Negative.   Psychiatric/Behavioral: Negative.     Blood pressure (!) 108/59, pulse 96, temperature 98.6 F (37 C), resp. rate 18, height 6\' 5"  (1.956 m), weight 90.7 kg, SpO2 99 %. Body mass index is 23.72 kg/m.  Treatment Plan Summary: Plan on reevaluation today the patient does not meet commitment criteria.  He there is no sign that he is dangerous to himself or others.  He denies psychotic symptoms.  He does not appear to be having acute psychosis.  Patient's IVC will be discontinued and the proper paperwork will be filed on the chart.  Patient  has been counseled that methamphetamine is clearly bad for him and strongly encouraged to discontinue it.  Furthermore I recommend that he continue on the haloperidol and Cogentin for now to assist in recovery.  He should be referred [facility] back to [facility] for outpatient mental health treatment.  Disposition: Patient does not meet criteria for psychiatric inpatient admission.  [facility], MD 04/07/2022 12:03 PM

## 2022-04-07 NOTE — Progress Notes (Signed)
  Progress Note   Patient: Michael Chandler NOT:[id number] DOB: 10-02-1985 DOA: 04/02/2022     3 DOS: the patient was seen and examined on 04/07/2022   Principal Problem:   Multifocal pneumonia Active Problems:   AIDS (acquired immune deficiency syndrome) ([place])   Schizophrenia, undifferentiated ([place])   Psychosis ([place])   MAI (mycobacterium avium-intracellulare) infection ([place])  Brief hospital course: Assessment and Plan: * Multifocal pneumonia Patient with fever Tmax of 100.7, nonproductive cough, myalgias and fatigue Chest x-ray shows bilateral interstitial prominence with patchy airspace opacities throughout the right lung, suspicious for multifocal pneumonia. Patient is also positive for Mycobacterium avium complex Continue on IV azithromycin, ethambutol and rifabutin as per ID.  Fungitell > 500 - Likely Pneumocystis per ID - stop Dapsone, started atovaquone   AIDS: w/ last known CD4 count of 181 from 11/23. Biktarvy was d/c and pt started on truvada, dolutegravir as per ID. See ID note today for meds recommendation at DC   Schizophrenia: undifferentiated. Presented to the ER under IVC papers for threatening his mother and sister and erratic behavior. Per mother he had been noncompliant with prescribed medications. Continue on haldol, benztropine, hydroxyzine, & mirtazapine.  Psych reevaluated him today and cleared him, IVC D/Ced. No need for inpatient psych any more.   Normocytic anemia: H&H are labile. No need for a transfusion currently    Thrombocytosis: etiology unclear. Will continue to monitor    Sinus tachycardia - likely deconditioned. Will request ambulation tomorrow once IVC lifted today. Could consider PT eval if need.      Subjective: Became tachycardic (140's) on minimal exertion (going to bathroom) per nursing. Patient felt some palpitation but now ok, not feeling like he can leave today. Sitter at bedside  Physical Exam: Vitals:   04/07/22 0332 04/07/22 0713  04/07/22 1240 04/07/22 2043  BP: 114/71 (!) 108/59 (!) 109/59 113/65  Pulse: (!) 106 96 92 89  Resp: _0 Temp: 98.9 F (37.2 C) 98.6 F (37 C) 98 F (36.7 C) 98.7 F (37.1 C)  TempSrc:      SpO2: 99% 99% 100% 99%  Weight:      Height:       General exam: Appears comfortable  Respiratory system: decreased breath sounds b/l  Cardiovascular system: S1/S2+. No rubs or clicks  Gastrointestinal system: Abd is soft, NT, ND & normal bowel sounds  Central nervous system: Alert and awake. Moves all extremities  Psychiatry: Judgement and insight appears near baseline. Data Reviewed:  There are no new results to review at this time.  Family Communication: none  Disposition: Status is: Inpatient Remains inpatient appropriate because: will need to get ambulated to make sure his vital remain stable. IVC being lifted today after psych eval. Monitor for any behavioral issues tonight  Planned Discharge Destination: Home   DVT prophylaxis - Lovenox Time spent: 25 minutes  Author: Max Sane, MD 04/07/2022 9:31 PM  For on call review [email or web address].

## 2022-04-08 DIAGNOSIS — J189 Pneumonia, unspecified organism: Secondary | ICD-10-CM | POA: Diagnosis not present

## 2022-04-08 LAB — COMPREHENSIVE METABOLIC PANEL
ALT: 14 U/L (ref 0–44)
AST: 26 U/L (ref 15–41)
Albumin: 2.5 g/dL — ABNORMAL LOW (ref 3.5–5.0)
Alkaline Phosphatase: 212 U/L — ABNORMAL HIGH (ref 38–126)
Anion gap: 6 (ref 5–15)
BUN: 9 mg/dL (ref 6–20)
CO2: 25 mmol/L (ref 22–32)
Calcium: 8.6 mg/dL — ABNORMAL LOW (ref 8.9–10.3)
Chloride: 103 mmol/L (ref 98–111)
Creatinine, Ser: 0.89 mg/dL (ref 0.61–1.24)
GFR, Estimated: >60 mL/min (ref >60)
Glucose, Bld: 100 mg/dL — ABNORMAL HIGH (ref 70–99)
Potassium: 3.9 mmol/L (ref 3.5–5.1)
Sodium: 134 mmol/L — ABNORMAL LOW (ref 135–145)
Total Bilirubin: 0.4 mg/dL (ref 0.3–1.2)
Total Protein: 8.5 g/dL — ABNORMAL HIGH (ref 6.5–8.1)

## 2022-04-08 LAB — CBC
HCT: 29 % — ABNORMAL LOW (ref 39.0–52.0)
Hemoglobin: 8.9 g/dL — ABNORMAL LOW (ref 13.0–17.0)
MCH: 25.3 pg — ABNORMAL LOW (ref 26.0–34.0)
MCHC: 30.7 g/dL (ref 30.0–36.0)
MCV: 82.4 fL (ref 80.0–100.0)
Platelets: 486 10*3/uL — ABNORMAL HIGH (ref 150–400)
RBC: 3.52 MIL/uL — ABNORMAL LOW (ref 4.22–5.81)
RDW: 18 % — ABNORMAL HIGH (ref 11.5–15.5)
WBC: 8.6 10*3/uL (ref 4.0–10.5)
nRBC: 0 % (ref 0.0–0.2)

## 2022-04-08 LAB — HEPATITIS B CORE ANTIBODY, TOTAL: Hep B Core Total Ab: NONREACTIVE

## 2022-04-08 MED ORDER — ATOVAQUONE 750 MG/5ML PO SUSP: 750.0000 mg | Freq: Two times a day (BID) | ORAL | 0 refills | Status: AC

## 2022-04-08 MED ORDER — RIFABUTIN 150 MG PO CAPS: 300.0000 mg | ORAL_CAPSULE | Freq: Every day | ORAL | 0 refills | Status: AC

## 2022-04-08 MED ORDER — BENZTROPINE MESYLATE 1 MG PO TABS: 1.0000 mg | ORAL_TABLET | Freq: Every day | ORAL | 0 refills | Status: DC

## 2022-04-08 MED ORDER — HALOPERIDOL 5 MG PO TABS: 5.0000 mg | ORAL_TABLET | Freq: Two times a day (BID) | ORAL | 0 refills | Status: DC

## 2022-04-08 MED ORDER — AZITHROMYCIN 500 MG PO TABS: 500.0000 mg | ORAL_TABLET | Freq: Every day | ORAL | 0 refills | Status: AC

## 2022-04-08 MED ORDER — DOLUTEGRAVIR SODIUM 50 MG PO TABS: 50.0000 mg | ORAL_TABLET | Freq: Every day | ORAL | 0 refills | Status: DC

## 2022-04-08 MED ORDER — ETHAMBUTOL HCL 400 MG PO TABS: 1200.0000 mg | ORAL_TABLET | Freq: Every day | ORAL | 0 refills | Status: AC

## 2022-04-08 MED ORDER — EMTRICITABINE-TENOFOVIR DF 200-300 MG PO TABS: 1.0000 | ORAL_TABLET | Freq: Every day | ORAL | 0 refills | Status: AC

## 2022-04-08 NOTE — Discharge Summary (Addendum)
PATIENT LEFT HOSPITAL AGAINST MEDICAL ADVICE.    He was cleared by psychiatry Dr. Toni Amend yesterday who discontinued patient's IVC.  He has insight and capacity to make his own medical decisions.  I discussed the risks and benefits of leaving the hospital.  I voiced my concerns that the patient's heart rate, infection, and other medications have not been fully monitored and treated.  He understands the information, declines further treatment options, and understands the consequences of leaving.  He insists on leaving the hospital against medical advice.  I asked the patient to return if he changes his mind about evaluation and treatment.   Patient: Michael Chandler MRN: [id number] DOB: Jun 26, 1985  Admit date:     04/02/2022  Discharge date: 04/08/22  Discharge Physician: Baldwin Jamaica   PCP: Georgann Housekeeper, MD   Recommendations at discharge:    I instructed patient to follow up with Infectious Disease Dr. Rivka Safer.  Appointment is scheduled fro 12/28. I sent prescriptions for his medications to [facility] in [place] at patient's request. 3.   I recommended followup with [facility] for outpatient mental health treatment. 4.   Discontinue Trazodone to avoid drug interactions / serotonin syndrome.  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 [facility] Course: As above.  Assessment and Plan: 36 year old male with PMH of AIDS on Truvada and Dolutegravir admitted with fevers and cough found to be due to multifocal pneumonia secondary to MAI with likely dissemination and mild PJP.    Patient was discharged on this regimen per Infectious Disease Dr. Rivka Safer: Truvada (  200-300mg ) /1 tablet a day Tivicay 50mg  once a day Ethambutol 1200mg  a day ( 400mg  X3) Azithromycin 500mg  a day Rifabutin 300mg  a day 9 150mg  x2) Atovaquone 750 mg Q 12  He will follow up in her clinic on 12/28.    Hepatitis B surface antigen and antibody were positive.  DNA was negative.  Repeat test was negative indicating no infection.  Psychiatry evaluated patient on 12/15, cleared him and discontinued IVC.  He does have mild tachycardia with minimal exertion while walking around the room. This will need to be re-evaluated outpatient when patient's anxiety is under control.  Consultants: Infectious Disease Disposition: Home Diet recommendation:  Discharge Diet Orders (From admission, onward)     Start     Ordered   04/08/22 0000  Diet - low sodium heart healthy        04/08/22 1138           DISCHARGE MEDICATION: Allergies as of 04/08/2022       Reactions   Bactrim [sulfamethoxazole-trimethoprim] Anaphylaxis        Medication List     STOP taking these medications    Biktarvy 50-200-25 MG Tabs tablet Generic drug: bictegravir-emtricitabine-tenofovir AF   dapsone 100 MG tablet   traZODone 150 MG tablet Commonly known as: DESYREL       TAKE these medications    atovaquone 750 MG/5ML suspension Commonly known as: MEPRON Take 5 mLs (750 mg total) by mouth 2 (two) times daily with a meal for 21 days.   azithromycin 500 MG tablet Commonly known as: ZITHROMAX Take 1 tablet (500 mg total) by mouth daily. Start taking on:  April 09, 2022   benztropine 1 MG tablet Commonly known as: COGENTIN Take 1 tablet (1 mg total) by mouth daily. Start taking on: April 09, 2022   dolutegravir 50 MG tablet Commonly known as: TIVICAY Take 1 tablet (50 mg total) by mouth daily. Start taking on: April 09, 2022   emtricitabine-tenofovir 200-300 MG tablet Commonly known as: TRUVADA Take 1 tablet by mouth daily. Start taking on: April 09, 2022    ethambutol 400 MG tablet Commonly known as: MYAMBUTOL Take 3 tablets (1,200 mg total) by mouth daily. Start taking on: April 09, 2022   folic acid 1 MG tablet Commonly known as: FOLVITE Take 1 tablet (1 mg total) by mouth daily.   haloperidol 5 MG tablet Commonly known as: HALDOL Take 1 tablet (5 mg total) by mouth 2 (two) times daily.   mirtazapine 15 MG disintegrating tablet Commonly known as: REMERON SOL-TAB Take 1 tablet (15 mg total) by mouth at bedtime.   rifabutin 150 MG capsule Commonly known as: MYCOBUTIN Take 2 capsules (300 mg total) by mouth daily. Start taking on: April 09, 2022   thiamine 100 MG tablet Commonly known as: Vitamin B-1 Take 1 tablet (100 mg total) by mouth daily.        Discharge Exam: Filed Weights   04/02/22 2041  Weight: 90.7 kg   Examination: General exam: alert and oriented, mental status intact HEENT: NCAT, PERRL Respiratory system: CTAB no WRR Cardiovascular system: Did not appreciate a murmur, regular, No JVD. Gastrointestinal system: No flank pain, Abdomen soft, NT,ND, BS+. Nervous System: No focal deficits. Extremities: No edema, distal peripheral pulses palpable.  Skin: No rashes, No bruises, No icterus. MSK: Muscle tone wnl  Condition at discharge: stable  The results of significant diagnostics from this hospitalization (including imaging, microbiology, ancillary and laboratory) are listed below for reference.   Imaging Studies: DG Chest Portable 1 View  Result Date: 04/03/2022 CLINICAL DATA:  Tachycardia, fever EXAM: PORTABLE CHEST 1 VIEW COMPARISON:  03/15/2022 FINDINGS: The heart size and mediastinal contours are within normal limits. Bilateral interstitial prominence with patchy airspace opacities throughout the right lung. No pleural effusion or pneumothorax. The visualized skeletal structures are unremarkable. IMPRESSION: Bilateral interstitial prominence with patchy airspace opacities throughout the right  lung, suspicious for multifocal pneumonia. Electronically Signed   By: Duanne Guess D.O.   On: 04/03/2022 20:57   IR US Guide Bx Asp/Drain  Result Date: 03/21/2022 CLINICAL DATA:  Left-sided cervical lymphadenopathy. History of chronic HIV, hepatitis B and treated syphilis. EXAM: ULTRASOUND GUIDED CORE BIOPSY OF LEFT CERVICAL LYMPH NODE MEDICATIONS: Moderate (conscious) sedation was employed during this procedure. A total of Versed 1.0 mg and Fentanyl 50 mcg was administered intravenously by radiology nursing. Moderate Sedation Time: 10 minutes. The patient's level of consciousness and vital signs were monitored continuously by radiology nursing throughout the procedure under my direct supervision. PROCEDURE: The procedure, risks, benefits, and alternatives were explained to the patient. Questions regarding the procedure were encouraged and answered. The patient understands and consents to the procedure. A time out was performed prior to initiating the procedure. The left neck was prepped with chlorhexidine in a sterile fashion, and a sterile drape was applied covering the operative field. A sterile gown and sterile gloves were used for the procedure. Local anesthesia was provided with 1% Lidocaine. After localizing an appropriate lymph node for biopsy, multiple 18 gauge core biopsy samples were obtained of an enlarged left cervical lymph node under direct ultrasound guidance. Core biopsy samples  were submitted on saline soaked Telfa gauze for both pathologic and microbiologic analysis. Additional ultrasound was performed on completion. COMPLICATIONS: None. FINDINGS: The largest identifiable lymph node in the left neck measures up to approximately 5 cm in maximum length. Multiple samples were obtained of this lymph node. A total of 9 core biopsy samples were split for pathologic analysis as well as standard aerobic/anaerobic culture, fungal culture and AFB culture/smear. IMPRESSION: Ultrasound-guided core  biopsy performed of an enlarged left cervical lymph node. Multiple core biopsy samples were sent for pathologic analysis as well as multiple culture studies. Electronically Signed   By:Irish Lackgata M.D.   On: 03/21/2022 12:45   MR THORACIC SPINE W WO CONTRAST  Result Date: 03/16/2022 CLINICAL DATA:  Immunocompromised with back pain and fever. EXAM: MRI CERVICAL, THORACIC AND LUMBAR SPINE WITHOUT AND WITH CONTRAST TECHNIQUE: Multiplanar and multiecho pulse sequences of the cervical spine, to include the craniocervical junction and cervicothoracic junction, and thoracic and lumbar spine, were obtained without and with intravenous contrast. CONTRAST:  9mL GADAVIST GADOBUTROL 1 MMOL/ML IV SOLN COMPARISON:  Neck CT 03/15/2022 and prior MR examinations 09/29/2021. FINDINGS: MRI CERVICAL SPINE FINDINGS Limited examination due to patient motion. Alignment: Normal Vertebrae: Significant artifact associated with the anterior and posterior cervical fusion hardware. No acute bony findings or evidence of discitis or osteomyelitis. Cord: Grossly cord signal intensity. No cord lesions or syrinx. Posterior Fossa, vertebral arteries, paraspinal tissues: As demonstrated on the neck CT there is massive left-sided cervical lymphadenopathy. Disc levels: No cervical disc protrusions spinal or foraminal stenosis. Moderate thinning of the cervical cord at the level of the prior surgery which is stable. MRI THORACIC SPINE FINDINGS Alignment:  He normal Vertebrae: Normal marrow signal. No bone lesions or fractures. No findings for discitis or osteomyelitis. Cord: Normal cord signal intensity. No cord lesions, syrinx or abnormal cord enhancement. Paraspinal and other soft tissues: No significant findings. Disc levels: Small left paracentral disc protrusion at T5-6. Small central disc protrusion at T8-9. Right paracentral disc protrusion at T9-10. MRI LUMBAR SPINE FINDINGS Segmentation: There are five lumbar type vertebral bodies. The  last full intervertebral disc space is labeled L5-S1. Alignment:  Normal Vertebrae: Normal marrow signal. No bone lesions or fractures. No findings suspicious for discitis or osteomyelitis. Conus medullaris and cauda equina: Conus extends to the L1 level. Conus and cauda equina appear normal. Paraspinal and other soft tissues: No significant paraspinal or retroperitoneal findings. Disc levels: L1-2: No significant findings. L2-3: Disc desiccation and disc space narrowing with a small to moderate-sized disc extrusion with mass effect on the ventral thecal sac. Some disc material is coursing up behind L2 and down behind L3. L3-4: No significant findings. L4-5: Mild annular bulge but no spinal or foraminal stenosis. L5-S1 no significant findings. IMPRESSION: 1. As demonstrated on the neck CT examination there is massive left-sided cervical lymphadenopathy. 2. No findings for discitis or osteomyelitis in the cervical, thoracic or lumbar spines. 3. Small thoracic disc protrusions but no significant spinal or foraminal stenosis. 4. Small to moderate-sized disc extrusion at L2-3 with mass effect on the ventral thecal sac. Electronically Signed   By: Rudie Meyer M.D.   On: 03/16/2022 14:53   MR Lumbar Spine W Wo Contrast  Result Date: 03/16/2022 CLINICAL DATA:  Immunocompromised with back pain and fever. EXAM: MRI CERVICAL, THORACIC AND LUMBAR SPINE WITHOUT AND WITH CONTRAST TECHNIQUE: Multiplanar and multiecho pulse sequences of the cervical spine, to include the craniocervical junction and cervicothoracic junction, and thoracic and lumbar  spine, were obtained without and with intravenous contrast. CONTRAST:  9mL GADAVIST GADOBUTROL 1 MMOL/ML IV SOLN COMPARISON:  Neck CT 03/15/2022 and prior MR examinations 09/29/2021. FINDINGS: MRI CERVICAL SPINE FINDINGS Limited examination due to patient motion. Alignment: Normal Vertebrae: Significant artifact associated with the anterior and posterior cervical fusion hardware.  No acute bony findings or evidence of discitis or osteomyelitis. Cord: Grossly cord signal intensity. No cord lesions or syrinx. Posterior Fossa, vertebral arteries, paraspinal tissues: As demonstrated on the neck CT there is massive left-sided cervical lymphadenopathy. Disc levels: No cervical disc protrusions spinal or foraminal stenosis. Moderate thinning of the cervical cord at the level of the prior surgery which is stable. MRI THORACIC SPINE FINDINGS Alignment:  He normal Vertebrae: Normal marrow signal. No bone lesions or fractures. No findings for discitis or osteomyelitis. Cord: Normal cord signal intensity. No cord lesions, syrinx or abnormal cord enhancement. Paraspinal and other soft tissues: No significant findings. Disc levels: Small left paracentral disc protrusion at T5-6. Small central disc protrusion at T8-9. Right paracentral disc protrusion at T9-10. MRI LUMBAR SPINE FINDINGS Segmentation: There are five lumbar type vertebral bodies. The last full intervertebral disc space is labeled L5-S1. Alignment:  Normal Vertebrae: Normal marrow signal. No bone lesions or fractures. No findings suspicious for discitis or osteomyelitis. Conus medullaris and cauda equina: Conus extends to the L1 level. Conus and cauda equina appear normal. Paraspinal and other soft tissues: No significant paraspinal or retroperitoneal findings. Disc levels: L1-2: No significant findings. L2-3: Disc desiccation and disc space narrowing with a small to moderate-sized disc extrusion with mass effect on the ventral thecal sac. Some disc material is coursing up behind L2 and down behind L3. L3-4: No significant findings. L4-5: Mild annular bulge but no spinal or foraminal stenosis. L5-S1 no significant findings. IMPRESSION: 1. As demonstrated on the neck CT examination there is massive left-sided cervical lymphadenopathy. 2. No findings for discitis or osteomyelitis in the cervical, thoracic or lumbar spines. 3. Small thoracic disc  protrusions but no significant spinal or foraminal stenosis. 4. Small to moderate-sized disc extrusion at L2-3 with mass effect on the ventral thecal sac. Electronically Signed   By: Rudie Meyer M.D.   On: 03/16/2022 14:53   MR CERVICAL SPINE W WO CONTRAST  Result Date: 03/16/2022 CLINICAL DATA:  Immunocompromised with back pain and fever. EXAM: MRI CERVICAL, THORACIC AND LUMBAR SPINE WITHOUT AND WITH CONTRAST TECHNIQUE: Multiplanar and multiecho pulse sequences of the cervical spine, to include the craniocervical junction and cervicothoracic junction, and thoracic and lumbar spine, were obtained without and with intravenous contrast. CONTRAST:  9mL GADAVIST GADOBUTROL 1 MMOL/ML IV SOLN COMPARISON:  Neck CT 03/15/2022 and prior MR examinations 09/29/2021. FINDINGS: MRI CERVICAL SPINE FINDINGS Limited examination due to patient motion. Alignment: Normal Vertebrae: Significant artifact associated with the anterior and posterior cervical fusion hardware. No acute bony findings or evidence of discitis or osteomyelitis. Cord: Grossly cord signal intensity. No cord lesions or syrinx. Posterior Fossa, vertebral arteries, paraspinal tissues: As demonstrated on the neck CT there is massive left-sided cervical lymphadenopathy. Disc levels: No cervical disc protrusions spinal or foraminal stenosis. Moderate thinning of the cervical cord at the level of the prior surgery which is stable. MRI THORACIC SPINE FINDINGS Alignment:  He normal Vertebrae: Normal marrow signal. No bone lesions or fractures. No findings for discitis or osteomyelitis. Cord: Normal cord signal intensity. No cord lesions, syrinx or abnormal cord enhancement. Paraspinal and other soft tissues: No significant findings. Disc levels: Small left paracentral disc  protrusion at T5-6. Small central disc protrusion at T8-9. Right paracentral disc protrusion at T9-10. MRI LUMBAR SPINE FINDINGS Segmentation: There are five lumbar type vertebral bodies. The last  full intervertebral disc space is labeled L5-S1. Alignment:  Normal Vertebrae: Normal marrow signal. No bone lesions or fractures. No findings suspicious for discitis or osteomyelitis. Conus medullaris and cauda equina: Conus extends to the L1 level. Conus and cauda equina appear normal. Paraspinal and other soft tissues: No significant paraspinal or retroperitoneal findings. Disc levels: L1-2: No significant findings. L2-3: Disc desiccation and disc space narrowing with a small to moderate-sized disc extrusion with mass effect on the ventral thecal sac. Some disc material is coursing up behind L2 and down behind L3. L3-4: No significant findings. L4-5: Mild annular bulge but no spinal or foraminal stenosis. L5-S1 no significant findings. IMPRESSION: 1. As demonstrated on the neck CT examination there is massive left-sided cervical lymphadenopathy. 2. No findings for discitis or osteomyelitis in the cervical, thoracic or lumbar spines. 3. Small thoracic disc protrusions but no significant spinal or foraminal stenosis. 4. Small to moderate-sized disc extrusion at L2-3 with mass effect on the ventral thecal sac. Electronically Signed   By: Rudie Meyer M.D.   On: 03/16/2022 14:53   CT HEAD WO CONTRAST ( )  Result Date: 03/15/2022 CLINICAL DATA:  Altered mental status. EXAM: CT HEAD WITHOUT CONTRAST TECHNIQUE: Contiguous axial images were obtained from the base of the skull through the vertex without intravenous contrast. RADIATION DOSE REDUCTION: This exam was performed according to the departmental dose-optimization program which includes automated exposure control, adjustment of the mA and/or kV according to patient size and/or use of iterative reconstruction technique. COMPARISON:  Brain MRI dated 09/29/2021. FINDINGS: Brain: The ventricles and sulci are appropriate size for the patient's age. The gray-white matter discrimination is preserved. There is no acute intracranial hemorrhage. No mass effect or  midline shift. No extra-axial fluid collection. Vascular: No hyperdense vessel or unexpected calcification. Skull: Normal. Negative for fracture or focal lesion. Sinuses/Orbits: Diffuse mucoperiosteal thickening of paranasal sinuses with opacification of several ethmoid air cells as well as opacification of the left frontal and sphenoid sinuses. There is left mastoid effusions. Other: None IMPRESSION: 1. No acute intracranial pathology. 2. Paranasal sinus disease. Left mastoid effusions. Electronically Signed   By: Elgie Collard M.D.   On: 03/15/2022 20:21   CT Soft Tissue Neck W Contrast  Addendum Date: 03/15/2022   ADDENDUM REPORT: 03/15/2022 20:08 ADDENDUM: There is a dictation error within the final line of the impression, which should read Emphysema (ICD10-J43.9) rather than Aortic Atherosclerosis (ICD10-I70.0). Electronically Signed   By: Jackey Loge D.O.   On: 03/15/2022 20:08   Result Date: 03/15/2022 CLINICAL DATA:  Provided history: Soft tissue infection suspected, neck, x-ray done. EXAM: CT NECK WITH CONTRAST TECHNIQUE: Multidetector CT imaging of the neck was performed using the standard protocol following the bolus administration of intravenous contrast. RADIATION DOSE REDUCTION: This exam was performed according to the departmental dose-optimization program which includes automated exposure control, adjustment of the mA and/or kV according to patient size and/or use of iterative reconstruction technique. CONTRAST:  75mL OMNIPAQUE IOHEXOL 300 MG/ML  SOLN COMPARISON:  None. FINDINGS: Pharynx and larynx: Streak and beam hardening artifact arising from dental restoration partially obscures the oral cavity. Within this limitation, there is no appreciable swelling or mass within the oral cavity, pharynx or larynx. No retropharyngeal collection. Salivary glands: The left submandibular gland appears anteriorly displaced by extensive conglomerate lymphadenopathy. The bilateral parotid  and  submandibular glands are otherwise unremarkable. Thyroid: Unremarkable. Lymph nodes: There is extensive lymphadenopathy throughout the left neck, much of which is conglomerate lymphadenopathy. Notably, an enlarged lymph node within the left lower neck has internal hypodensity, which may reflect suppuration or cystic/necrotic change (for instance as seen on series 6, image 74). A fat plane is poorly delineated between portions of this conglomerate lymphadenopathy and the left sternocleidomastoid muscle. A dominant conglomerate nodal mass measures 10.0 x 5.8 cm (for instance as seen on series 5, image 129). Vascular: The caudal left internal jugular vein is effaced by lymphadenopathy. The major vascular structures of the neck otherwise appear patent. Limited intracranial: No evidence of acute intracranial abnormality within the field of view. Visualized orbits: No orbital mass or acute orbital finding. Mastoids and visualized paranasal sinuses: Extensive partial opacification of the left frontal sinus due to the presence of mucosal thickening and fluid. Partial opacification of the left greater than right ethmoid air cells due to the presence of mucosal thickening and fluid. Near complete opacification of the left sphenoid sinus to the presence of mucosal thickening and fluid. Moderate to severe partial opacification of the bilateral maxillary sinuses due to the presence of mucosal thickening and fluid. Left mastoid effusion. Skeleton: ACDF hardware spanning the C4-C7 levels. Corpectomy cages are present at C5 and C6. A posterior spinal fusion construct spans the C3-T1 levels. Prior posterior decompression at the C3-C4 through C7-T1 levels. No acute fracture or aggressive osseous lesion. Upper chest: Centrilobular and paraseptal emphysema within the imaged lung apices. These results were called by telephone at the time of interpretation on 03/15/2022 at 5:36 pm to provider [facility] , who verbally acknowledged  these results. IMPRESSION: Extensive lymphadenopathy throughout the left neck, as described. This lymphadenopathy may be due to an infectious etiology. Alternatively, this could reflect sequelae of lymphoma or nodal metastatic disease. Correlate clinically and consider direct tissue sampling. Lymphadenopathy effaces the caudal left internal jugular vein. Pansinusitis. Large left mastoid effusion. Postsurgical changes to the cervical spine, as described. Aortic Atherosclerosis (ICD10-I70.0). Electronically Signed: By: Jackey Loge D.O. On: 03/15/2022 17:38   DG Chest 2 View  Result Date: 03/15/2022 CLINICAL DATA:  Fever. Recent pneumonia. Neck pain after neck surgery in 2021. EXAM: CHEST - 2 VIEW COMPARISON:  03/01/2022 FINDINGS: Normal heart size and pulmonary vascularity. No focal airspace disease or consolidation in the lungs. No blunting of costophrenic angles. No pneumothorax. Mediastinal contours appear intact. Postoperative changes in the lower cervical spine. IMPRESSION: No active cardiopulmonary disease. Electronically Signed   By: Burman Nieves M.D.   On: 03/15/2022 17:28    Microbiology: Results for orders placed or performed during the hospital encounter of 04/02/22  SARS Coronavirus 2 by RT PCR (hospital order, performed in [facility] hospital lab) *cepheid single result test* Anterior Nasal Swab     Status: None   Collection Time: 04/03/22  9:21 AM   Specimen: Anterior Nasal Swab  Result Value Ref Range Status   SARS Coronavirus 2 by RT PCR NEGATIVE NEGATIVE Final    Comment: (NOTE) SARS-CoV-2 target nucleic acids are NOT DETECTED.  The SARS-CoV-2 RNA is generally detectable in upper and lower respiratory specimens during the acute phase of infection. The lowest concentration of SARS-CoV-2 viral copies this assay can detect is 250 copies / mL. A negative result does not preclude SARS-CoV-2 infection and should not be used as the sole basis for treatment or other patient  management decisions.  A negative result may occur with improper  specimen collection / handling, submission of specimen other than nasopharyngeal swab, presence of viral mutation(s) within the areas targeted by this assay, and inadequate number of viral copies (<250 copies / mL). A negative result must be combined with clinical observations, patient history, and epidemiological information.  Fact Sheet for Patients:   RoadLapTop.co.za  Fact Sheet for Healthcare Providers: http://kim-miller.com/  This test is not yet approved or  cleared by the [place] FDA and has been authorized for detection and/or diagnosis of SARS-CoV-2 by FDA under an Emergency Use Authorization (EUA).  This EUA will remain in effect (meaning this test can be used) for the duration of the COVID-19 declaration under Section 564(b)(1) of the Act, 21 U.S.C. section 360bbb-3(b)(1), unless the authorization is terminated or revoked sooner.  Performed at [facility], [address] [place], [place] [address]   Resp panel by RT-PCR (RSV, Flu A&B, Covid) Anterior Nasal Swab     Status: None   Collection Time: 04/03/22  9:20 PM   Specimen: Anterior Nasal Swab  Result Value Ref Range Status   SARS Coronavirus 2 by RT PCR NEGATIVE NEGATIVE Final    Comment: (NOTE) SARS-CoV-2 target nucleic acids are NOT DETECTED.  The SARS-CoV-2 RNA is generally detectable in upper respiratory specimens during the acute phase of infection. The lowest concentration of SARS-CoV-2 viral copies this assay can detect is 138 copies/mL. A negative result does not preclude SARS-Cov-2 infection and should not be used as the sole basis for treatment or other patient management decisions. A negative result may occur with  improper specimen collection/handling, submission of specimen other than nasopharyngeal swab, presence of viral mutation(s) within the areas targeted by this assay,  and inadequate number of viral copies(<138 copies/mL). A negative result must be combined with clinical observations, patient history, and epidemiological information. The expected result is Negative.  Fact Sheet for Patients:  BloggerCourse.com  Fact Sheet for Healthcare Providers:  SeriousBroker.it  This test is no t yet approved or cleared by the [place] FDA and  has been authorized for detection and/or diagnosis of SARS-CoV-2 by FDA under an Emergency Use Authorization (EUA). This EUA will remain  in effect (meaning this test can be used) for the duration of the COVID-19 declaration under Section 564(b)(1) of the Act, 21 U.S.C.section 360bbb-3(b)(1), unless the authorization is terminated  or revoked sooner.       Influenza A by PCR NEGATIVE NEGATIVE Final   Influenza B by PCR NEGATIVE NEGATIVE Final    Comment: (NOTE) The Xpert Xpress SARS-CoV-2/FLU/RSV plus assay is intended as an aid in the diagnosis of influenza from Nasopharyngeal swab specimens and should not be used as a sole basis for treatment. Nasal washings and aspirates are unacceptable for Xpert Xpress SARS-CoV-2/FLU/RSV testing.  Fact Sheet for Patients: BloggerCourse.com  Fact Sheet for Healthcare Providers: SeriousBroker.it  This test is not yet approved or cleared by the [place] FDA and has been authorized for detection and/or diagnosis of SARS-CoV-2 by FDA under an Emergency Use Authorization (EUA). This EUA will remain in effect (meaning this test can be used) for the duration of the COVID-19 declaration under Section 564(b)(1) of the Act, 21 U.S.C. section 360bbb-3(b)(1), unless the authorization is terminated or revoked.     Resp Syncytial Virus by PCR NEGATIVE NEGATIVE Final    Comment: (NOTE) Fact Sheet for Patients: BloggerCourse.com  Fact Sheet for  Healthcare Providers: SeriousBroker.it  This test is not yet approved or cleared by the [place] FDA and has  been authorized for detection and/or diagnosis of SARS-CoV-2 by FDA under an Emergency Use Authorization (EUA). This EUA will remain in effect (meaning this test can be used) for the duration of the COVID-19 declaration under Section 564(b)(1) of the Act, 21 U.S.C. section 360bbb-3(b)(1), unless the authorization is terminated or revoked.  Performed at [facility], [address] [place], [place] [id number]     Labs: CBC: Recent Labs  Lab 04/02/22 2045 04/03/22 2140 04/06/22 0255 04/07/22 0629 04/08/22 0349  WBC 11.4* 7.1 7.9 9.3 8.6  NEUTROABS  --  2.8  --   --   --   HGB 8.5* 8.7* 8.4* 8.8* 8.9*  HCT 27.9* 28.5* 27.3* 28.5* 29.0*  MCV 85.8 85.3 83.7 82.8 82.4  PLT 485* 425* 443* 449* 486*   Basic Metabolic Panel: Recent Labs  Lab 04/02/22 2045 04/03/22 2140 04/06/22 0255 04/07/22 0629 04/08/22 0349  NA 134* 134* 133* 134* 134*  K 3.6 4.0 3.9 4.2 3.9  CL 102 103 103 106 103  CO2 24 23 23 25 25   GLUCOSE 81 98 112* 89 100*  BUN 11 13 10 8 9   CREATININE 0.99 1.14 1.02 0.91 0.89  CALCIUM 8.6* 8.3* 8.1* 8.7* 8.6*   Liver Function Tests: Recent Labs  Lab 04/02/22 2045 04/03/22 2140 04/06/22 0255 04/07/22 0629 04/08/22 0349  AST 33 33 27 23 26   ALT 18 16 13 13 14   ALKPHOS 293* 256* 223* 219* 212*  BILITOT 0.7 0.6 0.5 0.3 0.4  PROT 8.7* 8.0 7.6 8.2* 8.5*  ALBUMIN 3.1* 2.6* 2.5* 2.6* 2.5*    Discharge time spent: greater than 30 minutes.  Signed: Baldwin JamaicaHannah Sharifa Bucholz, MD [facility] 04/08/2022

## 2022-04-08 NOTE — Progress Notes (Signed)
Patient signed AMA form and is leaving AMA MD aware.  Patient said he will pick up prescriptions called into pharmacy and will follow up with Dr Lynne Logan office.

## 2022-04-09 LAB — HEPATITIS B SURFACE ANTIBODY, QUANTITATIVE: Hep B S AB Quant (Post): 108.8 m[IU]/mL (ref >9.9)

## 2022-04-10 LAB — MISC LABCORP TEST (SEND OUT): Labcorp test code: 832599

## 2022-04-11 ENCOUNTER — Telehealth: Payer: Self-pay

## 2022-04-11 LAB — AFB ORGANISM ID BY DNA PROBE
M avium complex: POSITIVE — AB
M avium complex: POSITIVE — AB
M avium complex: POSITIVE — AB
M tuberculosis complex: NEGATIVE
M tuberculosis complex: NEGATIVE

## 2022-04-11 LAB — ACID FAST CULTURE WITH REFLEXED SENSITIVITIES (MYCOBACTERIA)
Acid Fast Culture: POSITIVE — AB
Acid Fast Culture: POSITIVE — AB
Acid Fast Culture: POSITIVE — AB

## 2022-04-11 NOTE — Telephone Encounter (Signed)
I attempted to contact the patient to follow up with him. Patient did not answer and unable to leave a VM.  Michael Chandler'

## 2022-04-11 NOTE — Telephone Encounter (Signed)
-----   Message from Lynn Ito, MD sent at 04/11/2022  1:31 PM EST ----- Can you ask him whether he got all his meds from the pharmacy and is taking them ( truvada+ tivicay+ ethambutol+rifabutin+azithromycin+atovaquone)  after he left AMA? He has a follow up appt with me next week and will have to keep it. thx ----- Message ----- From: Leory Plowman, Lab In [place] Sent: 03/31/2022  10:35 PM EST To: Lynn Ito, MD

## 2022-04-12 NOTE — Telephone Encounter (Signed)
Second attempt to reach patient, no answer and voicemail box full.   Sandie Ano, RN

## 2022-04-13 NOTE — Telephone Encounter (Signed)
I attempted to contact the patient on his mother's contact number. Patient refused to speak with our office on the phone. Dr. Rivka Safer aware.  Michael Chandler [name] Loveless

## 2022-04-19 LAB — MAC SUSCEPTIBILITY BROTH
Ciprofloxacin: >8
Clarithromycin: 1
Doxycycline: >8
Minocycline: 8
Moxifloxacin: 1
Rifabutin: 0.25
Rifampin: >4
Streptomycin: 32

## 2022-04-19 LAB — AFB ORGANISM ID BY DNA PROBE: M avium complex: POSITIVE — AB

## 2022-04-19 LAB — ACID FAST CULTURE WITH REFLEXED SENSITIVITIES (MYCOBACTERIA): Acid Fast Culture: POSITIVE — AB

## 2022-04-20 ENCOUNTER — Encounter: Payer: Self-pay | Admitting: Infectious Diseases

## 2022-04-20 ENCOUNTER — Ambulatory Visit: Payer: No Typology Code available for payment source | Attending: Infectious Diseases | Admitting: Infectious Diseases

## 2022-04-20 VITALS — BP 149/109 | HR 59 | Temp 96.8°F | Ht 77.0 in | Wt 202.0 lb

## 2022-04-20 DIAGNOSIS — F209 Schizophrenia, unspecified: Secondary | ICD-10-CM | POA: Diagnosis not present

## 2022-04-20 DIAGNOSIS — R69 Illness, unspecified: Secondary | ICD-10-CM | POA: Diagnosis not present

## 2022-04-20 DIAGNOSIS — B20 Human immunodeficiency virus [HIV] disease: Secondary | ICD-10-CM | POA: Insufficient documentation

## 2022-04-20 DIAGNOSIS — D649 Anemia, unspecified: Secondary | ICD-10-CM | POA: Diagnosis not present

## 2022-04-20 DIAGNOSIS — A310 Pulmonary mycobacterial infection: Secondary | ICD-10-CM | POA: Diagnosis not present

## 2022-04-20 DIAGNOSIS — F419 Anxiety disorder, unspecified: Secondary | ICD-10-CM | POA: Diagnosis not present

## 2022-04-20 DIAGNOSIS — F1990 Other psychoactive substance use, unspecified, uncomplicated: Secondary | ICD-10-CM | POA: Insufficient documentation

## 2022-04-20 DIAGNOSIS — A319 Mycobacterial infection, unspecified: Secondary | ICD-10-CM | POA: Insufficient documentation

## 2022-04-20 NOTE — Patient Instructions (Signed)
You are ehre for follow up of HIV / mycobacterium avium infection- you are c/o enlarging left neck lymphnodes- you have not been taking the antibiotics correctly- you have been taking 1/3 rd the dose Please follow the antibiotic regimen as given below Azithromycin 500mg  Once a day Ethambutol 400mg  capsiule and you take 3 capsules a day Rifabutin is a 150mg  tablet and you take 2 pills aday Atovaquone liquid- take a teaspoon one in the monring and one in the evening Take Tivicay 50mg  tablet one a day Truvada itablet a day Do not take Biktarvy Follow up 2 weeks

## 2022-04-20 NOTE — Progress Notes (Signed)
NAME: Michael Chandler  DOB: [birth year]  MRN: [id number]  Date/Time: 04/20/2022 11:29 AM   Subjective:   ? Michael Chandler is a 36 y.o. 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, schizophrenia, polysubstance use, c5-c5 osteodiscitis and epidural abscess and had surgical decompression Nov 2021 MSSA + , fusion cervical spine with hardware non compliant with visits and medication  is here for follow up after he left AMA, pt was recently in the hospital between 04/02/22-04/08/22 IVC Once he was cleared from psychiatry he left AMA the next day and is here for follow up  His infectious history is as follows Was diagnosed with HIV some time in the past but was non compliant with meds and also was not acknowledging he had HIV In Oct 2023 he was admitted to [facility] in [facility] 10/24-1-0/30 23 with vomiting, malaise, sob and was seen by ID physician- His note is  in Care everywhere.extensive workup for Ois During that hospitalization he was diagnosed with viral pneumonia due to Rhino/adeno virus, ( PJP was ruled out by sputum neg for DFA but he had positive B D glucan at 458 which was thought to be  due to candida esophagitis, LDH 493)  diarrhea due to cryptosporidium and EPEC.  As he had headache he underwent LP on 02/15/22 which was Normal  normal protein and glucose, negative cryptococcus and neg VDRL   Other labs Serum RPR 1:2 ( treated in the past) TPA positive Beta D glucan was positive at 468 HIV RNA 160,170 CD4 -48 ( 6%) HEPb surface antigen positive Heb B Surface antibody was positive as well Core neg HEPB DNA 661 G6PD N Toxo IgG /IgM neg CT chest upper lobe GGO- emphysematous changes     He was started on biktavy, dapsone and fluconazole and discharged on the same. He was admitted to [facility] [place] in Nov 2023   He had ID out patient appt at  [facility] with Dr.Wallace on 03/13/22. During that visit as he was c/o neck pain  the plan was to do MRI  He had also done some labs and as the WBC was high his team was trying to reach patient on 11/21 with no success.    He ended up in [facility] ED on 03/15/22 with fever, cough, left sided neck pain . Underwent extensive workup  ID work up CMV DNA 617- low level- will not treat now- will repeat EBV dna positive but < 35 copies Quantiferon TB gold neg Sputum AFB 11/23 Neg 11/25 neg 11/28Neg Bartonella-neg Toxo-neg Cd4 pending Fungal antibodies neg Mycobacterium avium blood pending including biopsy of the lymphnode on 03/21/22  He was discharged after that on [place] and asked to follow up as OP- He missed the appt with his IDP in [place] on 03/30/22  The LN biopsy showed  granulomas and AFB and culture came back as MAC. He was readmitted to [facility] under IVC and as he had fever was in the medical side He was started on triple MAC therapy with 1228m Etambutol, rifabutin 3075mand azithromycin 50024mHe was also started on atovaquone 750m45mD. The biktarvy was discontinued because of drug interactions and was changed to truvada and Dolutegravir- Pt was released from IVC by psychiatrist and he left AMA the next day on 04/08/22 ( Saturday) HE is here for follow up He says th lymphnode which was getting better before has gotten worse He went home and got all his meds and came back to my  visit On reviewing all the meds it was evident that he was taking them incorrectly- He was taking ony 1 tablet of ethambutol instead of 3, 1 of rifabutin instead of 2 and was taking both biktarvy and tivicay+truvada Also was taking atovaquone once a day and not measuring it but drinking it directly from the bottle- HE is supposed to take 1 tsp twice a day. Pt is able to read the medication labels well but he was not doing so at home. Currently living with his mom but says he is moving to a hotel as he needs his space No fever Was having some night sweats     Past Medical History:  Diagnosis Date    HIV (human immunodeficiency virus infection) ([place])    Schizophrenia ([place])    per IVC paperwork, pt states does not have this dx    Past Surgical History:  Procedure Laterality Date   IR US GUIDE BX ASP/DRAIN  03/21/2022    Social History   Socioeconomic History   Marital status: Single    Spouse name: Not on file   Number of children: Not on file   Years of education: Not on file   Highest education level: Not on file  Occupational History   Not on file  Tobacco Use   Smoking status: Former    Types: Cigarettes   Smokeless tobacco: Never   Tobacco comments:    Patient denies using tobacco products (03/13/22)  Substance and Sexual Activity   Alcohol use: Yes    Comment: occasional   Drug use: Yes    Types: Methamphetamines, Marijuana, Cocaine    Comment: states occasional marijuana and meth use   Sexual activity: Not Currently    Comment: condoms accepted  Other Topics Concern   Not on file  Social History Narrative   Not on file   Social Determinants of Health   Financial Resource Strain: Not on file  Food Insecurity: No Food Insecurity (03/16/2022)   Hunger Vital Sign    Worried About Running Out of Food in the Last Year: Never true    [place] in the Last Year: Never true  Recent Concern: [place] Present (03/02/2022)   Hunger Vital Sign    Worried About Running Out of Food in the Last Year: Not on file    Ran Out of Food in the Last Year: Sometimes true  Transportation Needs: No Transportation Needs (03/16/2022)   PRAPARE - Hydrologist (Medical): No    Lack of Transportation (Non-Medical): No  Physical Activity: Not on file  Stress: Not on file  Social Connections: Not on file  Intimate Partner Violence: Not At Risk (03/16/2022)   Humiliation, Afraid, Rape, and Kick questionnaire    Fear of Current or Ex-Partner: No    Emotionally Abused: No    Physically Abused: No    Sexually Abused: No     No family history on file. Allergies  Allergen Reactions   Bactrim [Sulfamethoxazole-Trimethoprim] Anaphylaxis   I? Current Outpatient Medications  Medication Sig Dispense Refill   atovaquone (MEPRON) 750 MG/5ML suspension Take 5 mLs (750 mg total) by mouth 2 (two) times daily with a meal for 21 days. 210 mL 0   azithromycin (ZITHROMAX) 500 MG tablet Take 1 tablet (500 mg total) by mouth daily. 30 tablet 0   benztropine (COGENTIN) 1 MG tablet Take 1 tablet (1 mg total) by mouth daily. 30 tablet 0  dolutegravir (TIVICAY) 50 MG tablet Take 1 tablet (50 mg total) by mouth daily. 30 tablet 0   emtricitabine-tenofovir (TRUVADA) 200-300 MG tablet Take 1 tablet by mouth daily. 30 tablet 0   ethambutol (MYAMBUTOL) 400 MG tablet Take 3 tablets (1,200 mg total) by mouth daily. 90 tablet 0   folic acid (FOLVITE) 1 MG tablet Take 1 tablet (1 mg total) by mouth daily. 30 tablet 1   haloperidol (HALDOL) 5 MG tablet Take 1 tablet (5 mg total) by mouth 2 (two) times daily. 60 tablet 0   mirtazapine (REMERON SOL-TAB) 15 MG disintegrating tablet Take 1 tablet (15 mg total) by mouth at bedtime. 7 tablet 0   rifabutin (MYCOBUTIN) 150 MG capsule Take 2 capsules (300 mg total) by mouth daily. 60 capsule 0   thiamine (VITAMIN B-1) 100 MG tablet Take 1 tablet (100 mg total) by mouth daily. 30 tablet 1   No current facility-administered medications for this visit.     Abtx:  Anti-infectives (From admission, onward)    None       REVIEW OF SYSTEMS:  Const: negative fever, negative chills, negative weight loss Eyes: negative diplopia or visual changes, negative eye pain ENT: negative coryza, negative sore throat Resp: negative cough, hemoptysis, dyspnea Cards: negative for chest pain, palpitations, lower extremity edema GU: negative for frequency, dysuria and hematuria GI: Negative for abdominal pain, diarrhea, bleeding, constipation Skin: negative for rash and pruritus Heme: negative for easy  bruising and gum/nose bleeding MS: negative for myalgias, arthralgias, back pain and muscle weakness Neurolo:negative for headaches, dizziness, vertigo, memory problems  Psych: negative for feelings of anxiety, depression  Endocrine: negative for thyroid, diabetes Allergy/Immunology- negative for any medication or food allergies ? Pertinent Positives include : Objective:  VITALS:  BP (!) 149/109   Pulse (!) 59   Temp (!) 96.8 F (36 C) (Temporal)   Ht _0  (1.956 m)   Wt 202 lb (91.6 kg)   BMI 23.95 kg/m   PHYSICAL EXAM:  General: Alert, cooperative, no distress, appears stated age.  Head: Normocephalic, without obvious abnormality, atraumatic. Eyes: Conjunctivae clear, anicteric sclerae. Pupils are equal, pale ENT Nares normal. No drainage or sinus tenderness. Lips, mucosa, and tongue normal. No Thrush Neck: left side lymphadenopathy no carotid bruit and no JVD. Back: No CVA tenderness. Lungs: Clear to auscultation bilaterally. No Wheezing or Rhonchi. No rales. Heart: Regular rate and rhythm, no murmur, rub or gallop. Abdomen: Soft, non-tender,not distended. Bowel sounds normal. No masses Extremities: atraumatic, no cyanosis. No edema. No clubbing Skin: No rashes or lesions. Or bruising Lymph: Cervical, supraclavicular normal. Neurologic: Grossly non-focal Pertinent Labs       Latest Ref Rng & Units 04/08/2022    3:49 AM 04/07/2022    6:29 AM 04/06/2022    2:55 AM  CMP  Glucose 70 - 99 mg/dL 100  89  112   BUN 6 - 20 mg/dL _1 Creatinine 0.61 - 1.24 mg/dL 0.89  0.91  1.02   Sodium 135 - 145 mmol/L 134  134  133   Potassium 3.5 - 5.1 mmol/L 3.9  4.2  3.9   Chloride 98 - 111 mmol/L 103  106  103   CO2 22 - 32 mmol/L _2 Calcium 8.9 - 10.3 mg/dL 8.6  8.7  8.1   Total Protein 6.5 - 8.1 g/dL 8.5  8.2  7.6   Total Bilirubin 0.3 - 1.2 mg/dL 0.4  0.3  0.5   Alkaline Phos 38 - 126 U/L 212  219  223   AST 15 - 41 U/L _0 ALT 0 - 44 U/L _1 ID work up CMV DNA 617- low level- will not treat now- will repeat EBV dna positive but < 35 copies Quantiferon TB gold neg Sputum AFB 11/23 Neg 11/25 neg 11/28Neg Bartonella-neg Toxo-neg Fungal antibodies neg Mycobacterium avium blood pending 11/23, 11/25 and 11/28 sputum smear negative but culture positive  X 3 MAC 11/28 LN needle biopsy  - Afb seen in pathology smear and culture positive for MAC 03/18/22- Blood for AFB- neg so far    ?Cd4 on 03/18/22 -181 ( 13.9%)was 99 ( 6.6%) on 12/18/21 VL 3134 from 02/25/22 Impression/Recommendation AIDS- pt has been taking both bikr=tarvy and tivicay_truvada regimen Asked him to stop Biktarvy  Mycobacterium avium intracellulare- on ethambutol , azithromycin and rifabutin --worsening cervical lymphnode after improving?. This is due to him not taking the right dose - he is taking 1/3 rd the dose- Went thru the medications with him and asked him to take it correctly  Being treated as PCP with atovaquone- not taking it correctly- Pt was able to go home and bring all his medication bottles and we went over them correctly  Anemia  Schizophrenia with anxiety- this puts him at risk for non adherence Polysubstance use  Pt is moving from his mom's place to a hotel as he wants to have his own space- This puts him at risk for non adherence  HE will come back to se eme in 2 weeks to evaluate how he feels and how the lymphnodes are after he takes meds correctly  Treated syphilis- will repeat rpr next visit  HEPB surface antibody positive,  neg surface antigen, neg DNA after being positive earlier Will keep a close eye? ? _will do labs next visit__________________________________________________ Discussed with patient,in great detail Note:  This document was prepared using Dragon voice recognition software and may include unintentional dictation errors.

## 2022-04-21 LAB — FUNGAL ORGANISM REFLEX

## 2022-04-21 LAB — FUNGUS CULTURE WITH STAIN

## 2022-04-21 LAB — FUNGUS CULTURE RESULT

## 2022-05-04 ENCOUNTER — Inpatient Hospital Stay: Payer: No Typology Code available for payment source | Admitting: Infectious Diseases

## 2022-05-05 ENCOUNTER — Other Ambulatory Visit (HOSPITAL_COMMUNITY): Payer: Self-pay

## 2022-05-05 LAB — MISC LABCORP TEST (SEND OUT): Labcorp test code: 183764

## 2022-05-09 ENCOUNTER — Ambulatory Visit: Payer: No Typology Code available for payment source | Admitting: Infectious Diseases

## 2022-05-10 ENCOUNTER — Telehealth: Payer: Self-pay

## 2022-05-10 ENCOUNTER — Other Ambulatory Visit: Payer: Self-pay

## 2022-05-10 ENCOUNTER — Emergency Department
Admission: EM | Admit: 2022-05-10 | Discharge: 2022-05-10 | Disposition: A | Discharge status: Home / Self Care | Payer: No Typology Code available for payment source | Attending: Emergency Medicine | Admitting: Emergency Medicine

## 2022-05-10 DIAGNOSIS — G4700 Insomnia, unspecified: Secondary | ICD-10-CM | POA: Diagnosis not present

## 2022-05-10 DIAGNOSIS — Z79899 Other long term (current) drug therapy: Secondary | ICD-10-CM | POA: Diagnosis not present

## 2022-05-10 DIAGNOSIS — Z1339 Encounter for screening examination for other mental health and behavioral disorders: Secondary | ICD-10-CM | POA: Insufficient documentation

## 2022-05-10 DIAGNOSIS — F419 Anxiety disorder, unspecified: Secondary | ICD-10-CM | POA: Diagnosis not present

## 2022-05-10 DIAGNOSIS — Z21 Asymptomatic human immunodeficiency virus [HIV] infection status: Secondary | ICD-10-CM | POA: Insufficient documentation

## 2022-05-10 DIAGNOSIS — F209 Schizophrenia, unspecified: Secondary | ICD-10-CM | POA: Diagnosis not present

## 2022-05-10 DIAGNOSIS — B20 Human immunodeficiency virus [HIV] disease: Secondary | ICD-10-CM

## 2022-05-10 LAB — URINE DRUG SCREEN, QUALITATIVE (ARMC ONLY)
Amphetamines, Ur Screen: POSITIVE — AB
Barbiturates, Ur Screen: NOT DETECTED
Benzodiazepine, Ur Scrn: NOT DETECTED
Cannabinoid 50 Ng, Ur ~~LOC~~: NOT DETECTED
Cocaine Metabolite,Ur ~~LOC~~: NOT DETECTED
MDMA (Ecstasy)Ur Screen: NOT DETECTED
Methadone Scn, Ur: NOT DETECTED
Opiate, Ur Screen: NOT DETECTED
Phencyclidine (PCP) Ur S: NOT DETECTED
Tricyclic, Ur Screen: NOT DETECTED

## 2022-05-10 LAB — ACETAMINOPHEN LEVEL: Acetaminophen (Tylenol), Serum: <10 ug/mL — ABNORMAL LOW (ref 10–30)

## 2022-05-10 LAB — COMPREHENSIVE METABOLIC PANEL
ALT: 30 U/L (ref 0–44)
AST: 40 U/L (ref 15–41)
Albumin: 3.1 g/dL — ABNORMAL LOW (ref 3.5–5.0)
Alkaline Phosphatase: 158 U/L — ABNORMAL HIGH (ref 38–126)
Anion gap: 7 (ref 5–15)
BUN: 14 mg/dL (ref 6–20)
CO2: 26 mmol/L (ref 22–32)
Calcium: 8.6 mg/dL — ABNORMAL LOW (ref 8.9–10.3)
Chloride: 101 mmol/L (ref 98–111)
Creatinine, Ser: 1.06 mg/dL (ref 0.61–1.24)
GFR, Estimated: >60 mL/min (ref >60)
Glucose, Bld: 96 mg/dL (ref 70–99)
Potassium: 3.7 mmol/L (ref 3.5–5.1)
Sodium: 134 mmol/L — ABNORMAL LOW (ref 135–145)
Total Bilirubin: 0.5 mg/dL (ref 0.3–1.2)
Total Protein: 9.3 g/dL — ABNORMAL HIGH (ref 6.5–8.1)

## 2022-05-10 LAB — CBC
HCT: 35.6 % — ABNORMAL LOW (ref 39.0–52.0)
Hemoglobin: 10.9 g/dL — ABNORMAL LOW (ref 13.0–17.0)
MCH: 25.6 pg — ABNORMAL LOW (ref 26.0–34.0)
MCHC: 30.6 g/dL (ref 30.0–36.0)
MCV: 83.8 fL (ref 80.0–100.0)
Platelets: 478 10*3/uL — ABNORMAL HIGH (ref 150–400)
RBC: 4.25 MIL/uL (ref 4.22–5.81)
RDW: 18.7 % — ABNORMAL HIGH (ref 11.5–15.5)
WBC: 8.6 10*3/uL (ref 4.0–10.5)
nRBC: 0 % (ref 0.0–0.2)

## 2022-05-10 LAB — SALICYLATE LEVEL: Salicylate Lvl: <7 mg/dL — ABNORMAL LOW (ref 7.0–30.0)

## 2022-05-10 LAB — ETHANOL: Alcohol, Ethyl (B): <10 mg/dL (ref <10)

## 2022-05-10 MED ORDER — DOXYCYCLINE HYCLATE 100 MG PO CAPS: 100.0000 mg | ORAL_CAPSULE | Freq: Two times a day (BID) | ORAL | 0 refills | Status: DC

## 2022-05-10 MED ORDER — TRAZODONE HCL 150 MG PO TABS: 150.0000 mg | ORAL_TABLET | Freq: Every day | ORAL | 0 refills | Status: DC

## 2022-05-10 MED ORDER — TRAZODONE HCL 50 MG PO TABS
150.0000 mg | ORAL_TABLET | ORAL | Status: AC
  Administered 2022-05-10: 150 mg via ORAL
  Filled 2022-05-10: qty 1

## 2022-05-10 MED ORDER — HALOPERIDOL 5 MG PO TABS
5.0000 mg | ORAL_TABLET | Freq: Once | ORAL | Status: AC
  Administered 2022-05-10: 5 mg via ORAL
  Filled 2022-05-10: qty 1

## 2022-05-10 MED ORDER — HALOPERIDOL 5 MG PO TABS: 5.0000 mg | ORAL_TABLET | Freq: Two times a day (BID) | ORAL | 0 refills | Status: DC

## 2022-05-10 MED ORDER — BENZTROPINE MESYLATE 1 MG PO TABS: 1.0000 mg | ORAL_TABLET | Freq: Every day | ORAL | 0 refills | Status: DC

## 2022-05-10 MED ORDER — GABAPENTIN 300 MG PO CAPS: 300.0000 mg | ORAL_CAPSULE | Freq: Three times a day (TID) | ORAL | 0 refills | Status: DC

## 2022-05-10 NOTE — ED Notes (Signed)
All pt belongings placed in 2 bags. Pt dressed into wine scrubs

## 2022-05-10 NOTE — ED Notes (Signed)
Snack and beverage provided  

## 2022-05-10 NOTE — ED Provider Notes (Signed)
[facility] Provider Note    Event Date/Time   First MD Initiated Contact with Patient 05/10/22 2106     (approximate)   History   Chief Complaint: Psychiatric Evaluation   HPI  Michael Chandler is a 37 y.o. male with a history of HIV, schizophrenia, syphilis, HSV, hepatitis, M AI infection of left cervical lymph node, polysubstance abuse who is brought to the ED due to insomnia and anxiety and racing thoughts.  Patient reports that he ran out of his schizophrenia medications.  Reviewing electronic medical record from recent hospitalization in [place], this includes Haldol, trazodone, Cogentin, gabapentin.  Patient confirms these are the correct medications.  Denies SI HI or hallucinations or paranoia.  He feels that everything is under control at home and denies any safety concerns.  Reviewed outside records from his recent infectious disease clinic visit with Dr. Steva Ready, noting that he has already had biopsy of this left cervical mass which is shown to be MAC infection.  Has been slowly worsening due to patient taking triple antibiotic therapy incorrectly.  Currently patient denies any chest pain shortness of breath or difficulty swallowing.  No fever.  No back pain.  His main complaint tonight is running out of medications that he needs to take at nighttime to be addressed.     Physical Exam   Triage Vital Signs: ED Triage Vitals [05/10/22 2052]  Enc Vitals Group     BP 134/75     Pulse Rate (!) 103     Resp 18     Temp 98.3 F (36.8 C)     Temp Source Oral     SpO2 100 %     Weight 200 lb (90.7 kg)     Height      Head Circumference      Peak Flow      Pain Score 0     Pain Loc      Pain Edu?      Excl. in [place]?     Most recent vital signs: Vitals:   05/10/22 2052  BP: 134/75  Pulse: (!) 103  Resp: 18  Temp: 98.3 F (36.8 C)  SpO2: 100%    General: Awake, no distress.  CV:  Good peripheral perfusion.  Resp:  Normal effort.   Abd:  No distention.  Other:  Left neck has a swollen mass overlying the area of the SCM.  No fluctuance, no crepitus, no inflammatory soft tissue changes.  Not tender.  Ultrasound evaluation of the area shows a minimal fluid collection, not amenable to incision and drainage or aspiration.  There is a small overlying area of skin thinning, possibly pustule head formation.   ED Results / Procedures / Treatments   Labs (all labs ordered are listed, but only abnormal results are displayed) Labs Reviewed  COMPREHENSIVE METABOLIC PANEL - Abnormal; Notable for the following components:      Result Value   Sodium 134 (*)    Calcium 8.6 (*)    Total Protein 9.3 (*)    Albumin 3.1 (*)    Alkaline Phosphatase 158 (*)    All other components within normal limits  CBC - Abnormal; Notable for the following components:   Hemoglobin 10.9 (*)    HCT 35.6 (*)    MCH 25.6 (*)    RDW 18.7 (*)    Platelets 478 (*)    All other components within normal limits  ETHANOL  SALICYLATE LEVEL  ACETAMINOPHEN LEVEL  URINE  DRUG SCREEN, QUALITATIVE ([facility] ONLY)     EKG    RADIOLOGY    PROCEDURES:  Procedures   MEDICATIONS ORDERED IN ED: Medications  haloperidol (HALDOL) tablet 5 mg (has no administration in time range)  traZODone (DESYREL) tablet 150 mg (has no administration in time range)     IMPRESSION / MDM / ASSESSMENT AND PLAN / ED COURSE  I reviewed the triage vital signs and the nursing notes.                                Patient's presentation is most consistent with exacerbation of chronic illness.  Patient presents with feeling more anxious in the setting of running out of his Haldol and trazodone.  No SI HI or hallucinations, thought content is rational and linear.  He does not appear to be disorganized or dangerous to himself or others.  I sent new prescriptions for his medications to the pharmacy.  Encouraged him again to take all of his antibiotics as prescribed and  continue following up with infectious disease.       FINAL CLINICAL IMPRESSION(S) / ED DIAGNOSES   Final diagnoses:  Chronic schizophrenia ([place])  Currently asymptomatic HIV infection, with history of HIV-related illness ([place])     Rx / DC Orders   ED Discharge Orders          Ordered    doxycycline (VIBRAMYCIN) 100 MG capsule  2 times daily,   Status:  Discontinued        05/10/22 2153    haloperidol (HALDOL) 5 MG tablet  2 times daily        05/10/22 2153    traZODone (DESYREL) 150 MG tablet  Daily at bedtime        05/10/22 2153    benztropine (COGENTIN) 1 MG tablet  Daily        05/10/22 2153    gabapentin (NEURONTIN) 300 MG capsule  3 times daily        05/10/22 2153             Note:  This document was prepared using Dragon voice recognition software and may include unintentional dictation errors.   Carrie Mew, MD 05/10/22 2203

## 2022-05-10 NOTE — ED Notes (Signed)
MD at bedside for pt evaluation

## 2022-05-10 NOTE — ED Triage Notes (Addendum)
Pt brought in by mother voluntarily, reports he has been having some insomnia x a few days, as well as racing thoughts. States he is hearing voices "blabbering". Denies any AH/SI/HI, denies any substance use. States he has been out of his meds x a few weeks. Hx of schizophrenia and HIV. Reports L neck pain and swelling related to a L neck mass increasing in size per pt

## 2022-05-10 NOTE — ED Notes (Signed)
Pt called his mom and informed her of his pending discharge. Pt awaiting ride home from him mom at this time. Resting with blanket covering his face.

## 2022-05-10 NOTE — ED Notes (Signed)
Pt in restroom getting changed and ED tech went to restroom to check on pt. Pt sitting on floor next to the toilet changing his pants. Pt states he had been asleep on the stretcher and briefly felt a little dizzy after getting up so quickly. Denies dizziness currently.  MD aware and came to check on pt to ensure he was still appropriate to be discharged home. Pt denies pain or dizziness currently. no distress noted.

## 2022-05-10 NOTE — Discharge Instructions (Addendum)
Apply warm compresses to the swollen area of the left side of your neck three times a day for the next week.  Be sure to take all of the antibiotics that Dr. Delaine Lame discussed with you according to the instructions on the labels.    Continue taking your other medications as prescribed and follow up with the Infectious Disease specialist.

## 2022-05-10 NOTE — Telephone Encounter (Signed)
Patient called office today stating he is currently in the area and would like to know if he can stop by and see Dr. Delaine Lame regarding missed appointment. Informed patient that provider only has clinic on Tuesday and Thursday, but could help reschedule visit.  Patient declined to reschedule appointment and ended call. Leatrice Jewels, RMA

## 2022-05-16 ENCOUNTER — Other Ambulatory Visit (HOSPITAL_COMMUNITY): Payer: Self-pay

## 2022-05-21 ENCOUNTER — Emergency Department: Payer: No Typology Code available for payment source

## 2022-05-21 ENCOUNTER — Emergency Department
Admission: EM | Admit: 2022-05-21 | Discharge: 2022-05-21 | Disposition: A | Discharge status: Home / Self Care | Payer: No Typology Code available for payment source | Attending: Emergency Medicine | Admitting: Emergency Medicine

## 2022-05-21 DIAGNOSIS — M7989 Other specified soft tissue disorders: Secondary | ICD-10-CM | POA: Diagnosis not present

## 2022-05-21 DIAGNOSIS — W19XXXA Unspecified fall, initial encounter: Secondary | ICD-10-CM | POA: Diagnosis not present

## 2022-05-21 DIAGNOSIS — R9431 Abnormal electrocardiogram [ECG] [EKG]: Secondary | ICD-10-CM | POA: Insufficient documentation

## 2022-05-21 DIAGNOSIS — M79675 Pain in left toe(s): Secondary | ICD-10-CM | POA: Insufficient documentation

## 2022-05-21 DIAGNOSIS — M2012 Hallux valgus (acquired), left foot: Secondary | ICD-10-CM | POA: Insufficient documentation

## 2022-05-21 DIAGNOSIS — F209 Schizophrenia, unspecified: Secondary | ICD-10-CM | POA: Diagnosis not present

## 2022-05-21 DIAGNOSIS — E860 Dehydration: Secondary | ICD-10-CM | POA: Diagnosis not present

## 2022-05-21 DIAGNOSIS — R42 Dizziness and giddiness: Secondary | ICD-10-CM | POA: Diagnosis not present

## 2022-05-21 DIAGNOSIS — S0990XA Unspecified injury of head, initial encounter: Secondary | ICD-10-CM | POA: Diagnosis not present

## 2022-05-21 DIAGNOSIS — I959 Hypotension, unspecified: Secondary | ICD-10-CM | POA: Diagnosis not present

## 2022-05-21 LAB — CBC WITH DIFFERENTIAL/PLATELET
Abs Immature Granulocytes: 0.09 10*3/uL — ABNORMAL HIGH (ref 0.00–0.07)
Basophils Absolute: 0 10*3/uL (ref 0.0–0.1)
Basophils Relative: 0 %
Eosinophils Absolute: 0.1 10*3/uL (ref 0.0–0.5)
Eosinophils Relative: 1 %
HCT: 31.8 % — ABNORMAL LOW (ref 39.0–52.0)
Hemoglobin: 9.9 g/dL — ABNORMAL LOW (ref 13.0–17.0)
Immature Granulocytes: 1 %
Lymphocytes Relative: 14 %
Lymphs Abs: 1.8 10*3/uL (ref 0.7–4.0)
MCH: 25.4 pg — ABNORMAL LOW (ref 26.0–34.0)
MCHC: 31.1 g/dL (ref 30.0–36.0)
MCV: 81.5 fL (ref 80.0–100.0)
Monocytes Absolute: 2 10*3/uL — ABNORMAL HIGH (ref 0.1–1.0)
Monocytes Relative: 16 %
Neutro Abs: 8.3 10*3/uL — ABNORMAL HIGH (ref 1.7–7.7)
Neutrophils Relative %: 68 %
Platelets: 444 10*3/uL — ABNORMAL HIGH (ref 150–400)
RBC: 3.9 MIL/uL — ABNORMAL LOW (ref 4.22–5.81)
RDW: 17.5 % — ABNORMAL HIGH (ref 11.5–15.5)
WBC: 12.4 10*3/uL — ABNORMAL HIGH (ref 4.0–10.5)
nRBC: 0 % (ref 0.0–0.2)

## 2022-05-21 LAB — COMPREHENSIVE METABOLIC PANEL
ALT: 19 U/L (ref 0–44)
AST: 23 U/L (ref 15–41)
Albumin: 2.6 g/dL — ABNORMAL LOW (ref 3.5–5.0)
Alkaline Phosphatase: 128 U/L — ABNORMAL HIGH (ref 38–126)
Anion gap: 10 (ref 5–15)
BUN: 11 mg/dL (ref 6–20)
CO2: 25 mmol/L (ref 22–32)
Calcium: 8.5 mg/dL — ABNORMAL LOW (ref 8.9–10.3)
Chloride: 100 mmol/L (ref 98–111)
Creatinine, Ser: 0.86 mg/dL (ref 0.61–1.24)
GFR, Estimated: >60 mL/min (ref >60)
Glucose, Bld: 101 mg/dL — ABNORMAL HIGH (ref 70–99)
Potassium: 3.9 mmol/L (ref 3.5–5.1)
Sodium: 135 mmol/L (ref 135–145)
Total Bilirubin: 0.5 mg/dL (ref 0.3–1.2)
Total Protein: 8.2 g/dL — ABNORMAL HIGH (ref 6.5–8.1)

## 2022-05-21 LAB — ETHANOL: Alcohol, Ethyl (B): <10 mg/dL (ref <10)

## 2022-05-21 MED ORDER — SODIUM CHLORIDE 0.9 % IV BOLUS
1000.0000 mL | Freq: Once | INTRAVENOUS | Status: AC
  Administered 2022-05-21: 1000 mL via INTRAVENOUS

## 2022-05-21 NOTE — ED Notes (Signed)
Pt given apple sauce at this time.  OK per EDP Joni Fears verbal.

## 2022-05-21 NOTE — ED Provider Notes (Signed)
[facility] Provider Note    Event Date/Time   First MD Initiated Contact with Patient 05/21/22 1756     (approximate)   History   Chief Complaint: Fall and Dizziness   HPI  Michael Chandler is a 37 y.o. 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  He is fallen 3 times due to the dizziness today.  He had 1 episode of passing out.  Denies headache or neck pain.  No vision changes paresthesias or motor weakness.  Currently feels fine except for pain in his left great toe due to twisting it when he fell.    Physical Exam   Triage Vital Signs: ED Triage Vitals  Enc Vitals Group     BP 05/21/22 1754 117/68     Pulse Rate 05/21/22 1754 90     Resp 05/21/22 1754 14     Temp 05/21/22 1754 97.7 F (36.5 C)     Temp src --      SpO2 --      Weight 05/21/22 1755 205 lb (93 kg)     Height 05/21/22 1755 6\' 5"  (1.956 m)     Head Circumference --      Peak Flow --      Pain Score 05/21/22 1755 8     Pain Loc --      Pain Edu? --      Excl. in [place]? --     Most recent vital signs: Vitals:   05/21/22 1930 05/21/22 1936  BP: 110/79   Pulse: 82   Resp: 16   Temp:  (!) 97.4 F (36.3 C)  SpO2: 100%     General: Awake, no distress.  CV:  Good peripheral perfusion.  Regular rate rhythm Resp:  Normal effort.  Good auscultation bilaterally Abd:  No distention.  Soft nontender Other:  Neuro intact.  Left neck mass similar to previous.  Not tender or inflamed, no drainage, no fluctuance.  Left first MTP joint with tenderness and mild amount of localized swelling.  Dry mucous membranes.   ED Results / Procedures / Treatments   Labs (all labs ordered are listed, but only abnormal results are displayed) Labs Reviewed  COMPREHENSIVE METABOLIC PANEL - Abnormal; Notable for the following components:      Result Value   Calcium 8.5 (*)    Total Protein 8.2 (*)    Albumin 2.6 (*)     Alkaline Phosphatase 128 (*)    All other components within normal limits  CBC WITH DIFFERENTIAL/PLATELET - Abnormal; Notable for the following components:   WBC 12.4 (*)    RBC 3.90 (*)    Hemoglobin 9.9 (*)    HCT 31.8 (*)    MCH 25.4 (*)    RDW 17.5 (*)    Platelets 444 (*)    Neutro Abs 8.3 (*)    Monocytes Absolute 2.0 (*)    Abs Immature Granulocytes 0.09 (*)    All other components within normal limits  ETHANOL     EKG Interpreted by me Sinus rhythm rate of 92.  Normal axis and intervals.  Normal QRS ST segments and T waves.  No evidence of arrhythmia.   RADIOLOGY X-ray left foot interpreted by me, negative for fracture.  No dislocation.  Radiology report reviewed.   PROCEDURES:  Procedures   MEDICATIONS ORDERED IN ED: Medications  sodium chloride 0.9 % bolus 1,000  mL (1,000 mLs Intravenous New Bag/Given 05/21/22 1933)     IMPRESSION / MDM / ASSESSMENT AND PLAN / ED COURSE  I reviewed the triage vital signs and the nursing notes.  DDx: Dehydration, electrolyte abnormality, AKI, anemia, benign syncope, toe fracture  Patient's presentation is most consistent with acute presentation with potential threat to life or bodily function.  Patient presents with syncope at home, appears dehydrated.  Will give IV fluids, obtain labs, x-ray left foot   Clinical Course as of 05/21/22 2030  Sun May 21, 2022  1923 PIV inserted by me due to difficult stick [PS]  2002 Labs and foot x-ray all unremarkable.  Clinically his presentation is consistent with dehydration.  Vital signs are normal.  Will complete fluid bolus, and plan for discharge. [PS]    Clinical Course User Index [PS] Carrie Mew, MD     FINAL CLINICAL IMPRESSION(S) / ED DIAGNOSES   Final diagnoses:  Dehydration     Rx / DC Orders   ED Discharge Orders     None        Note:  This document was prepared using Dragon voice recognition software and may include unintentional dictation  errors.   Carrie Mew, MD 05/21/22 2030

## 2022-05-21 NOTE — ED Triage Notes (Signed)
Patient presents to the ER from home via ambulance with c/o dizziness for 3 months. Patient had 3 falls at home today with positive LOC.  Per EMS patient had positive orthostatic BP 121/81 sitting, 105/55 standing.

## 2022-05-21 NOTE — ED Notes (Signed)
Pr states that he will take Tylenol when he gets home for his pain.

## 2022-05-25 ENCOUNTER — Other Ambulatory Visit (HOSPITAL_COMMUNITY): Payer: Self-pay

## 2022-05-30 ENCOUNTER — Other Ambulatory Visit (HOSPITAL_COMMUNITY): Payer: Self-pay

## 2022-06-25 ENCOUNTER — Other Ambulatory Visit: Payer: Self-pay

## 2022-06-25 ENCOUNTER — Emergency Department: Payer: No Typology Code available for payment source

## 2022-06-25 ENCOUNTER — Inpatient Hospital Stay
Admission: EM | Admit: 2022-06-25 | Discharge: 2022-06-29 | DRG: 975 | Disposition: A | Discharge status: Home / Self Care | Payer: No Typology Code available for payment source | Attending: Student | Admitting: Student

## 2022-06-25 DIAGNOSIS — Z1152 Encounter for screening for COVID-19: Secondary | ICD-10-CM | POA: Diagnosis not present

## 2022-06-25 DIAGNOSIS — Z8661 Personal history of infections of the central nervous system: Secondary | ICD-10-CM

## 2022-06-25 DIAGNOSIS — D649 Anemia, unspecified: Secondary | ICD-10-CM | POA: Diagnosis not present

## 2022-06-25 DIAGNOSIS — Z91199 Patient's noncompliance with other medical treatment and regimen due to unspecified reason: Secondary | ICD-10-CM | POA: Diagnosis not present

## 2022-06-25 DIAGNOSIS — Z87891 Personal history of nicotine dependence: Secondary | ICD-10-CM

## 2022-06-25 DIAGNOSIS — J984 Other disorders of lung: Secondary | ICD-10-CM | POA: Diagnosis not present

## 2022-06-25 DIAGNOSIS — B20 Human immunodeficiency virus [HIV] disease: Secondary | ICD-10-CM | POA: Diagnosis present

## 2022-06-25 DIAGNOSIS — L0211 Cutaneous abscess of neck: Secondary | ICD-10-CM | POA: Diagnosis present

## 2022-06-25 DIAGNOSIS — F1520 Other stimulant dependence, uncomplicated: Secondary | ICD-10-CM | POA: Diagnosis present

## 2022-06-25 DIAGNOSIS — E872 Acidosis, unspecified: Secondary | ICD-10-CM | POA: Diagnosis not present

## 2022-06-25 DIAGNOSIS — D849 Immunodeficiency, unspecified: Secondary | ICD-10-CM | POA: Diagnosis present

## 2022-06-25 DIAGNOSIS — Z8739 Personal history of other diseases of the musculoskeletal system and connective tissue: Secondary | ICD-10-CM | POA: Diagnosis not present

## 2022-06-25 DIAGNOSIS — N179 Acute kidney failure, unspecified: Secondary | ICD-10-CM | POA: Diagnosis present

## 2022-06-25 DIAGNOSIS — A310 Pulmonary mycobacterial infection: Secondary | ICD-10-CM | POA: Diagnosis present

## 2022-06-25 DIAGNOSIS — F203 Undifferentiated schizophrenia: Secondary | ICD-10-CM | POA: Diagnosis not present

## 2022-06-25 DIAGNOSIS — G4700 Insomnia, unspecified: Secondary | ICD-10-CM | POA: Diagnosis present

## 2022-06-25 DIAGNOSIS — Z981 Arthrodesis status: Secondary | ICD-10-CM | POA: Diagnosis not present

## 2022-06-25 DIAGNOSIS — F419 Anxiety disorder, unspecified: Secondary | ICD-10-CM | POA: Diagnosis not present

## 2022-06-25 DIAGNOSIS — R000 Tachycardia, unspecified: Secondary | ICD-10-CM | POA: Diagnosis not present

## 2022-06-25 DIAGNOSIS — I881 Chronic lymphadenitis, except mesenteric: Secondary | ICD-10-CM | POA: Diagnosis not present

## 2022-06-25 DIAGNOSIS — Z79899 Other long term (current) drug therapy: Secondary | ICD-10-CM

## 2022-06-25 DIAGNOSIS — R6520 Severe sepsis without septic shock: Secondary | ICD-10-CM | POA: Diagnosis not present

## 2022-06-25 DIAGNOSIS — Z91148 Patient's other noncompliance with medication regimen for other reason: Secondary | ICD-10-CM

## 2022-06-25 DIAGNOSIS — E860 Dehydration: Secondary | ICD-10-CM | POA: Diagnosis present

## 2022-06-25 DIAGNOSIS — A419 Sepsis, unspecified organism: Secondary | ICD-10-CM | POA: Diagnosis not present

## 2022-06-25 DIAGNOSIS — L03221 Cellulitis of neck: Secondary | ICD-10-CM | POA: Diagnosis present

## 2022-06-25 DIAGNOSIS — Z882 Allergy status to sulfonamides status: Secondary | ICD-10-CM

## 2022-06-25 DIAGNOSIS — R221 Localized swelling, mass and lump, neck: Secondary | ICD-10-CM | POA: Diagnosis not present

## 2022-06-25 LAB — CBC WITH DIFFERENTIAL/PLATELET
Abs Immature Granulocytes: 0.18 10*3/uL — ABNORMAL HIGH (ref 0.00–0.07)
Basophils Absolute: 0 10*3/uL (ref 0.0–0.1)
Basophils Relative: 0 %
Eosinophils Absolute: 0 10*3/uL (ref 0.0–0.5)
Eosinophils Relative: 0 %
HCT: 31.9 % — ABNORMAL LOW (ref 39.0–52.0)
Hemoglobin: 9.7 g/dL — ABNORMAL LOW (ref 13.0–17.0)
Immature Granulocytes: 1 %
Lymphocytes Relative: 12 %
Lymphs Abs: 1.7 10*3/uL (ref 0.7–4.0)
MCH: 25.2 pg — ABNORMAL LOW (ref 26.0–34.0)
MCHC: 30.4 g/dL (ref 30.0–36.0)
MCV: 82.9 fL (ref 80.0–100.0)
Monocytes Absolute: 1.5 10*3/uL — ABNORMAL HIGH (ref 0.1–1.0)
Monocytes Relative: 11 %
Neutro Abs: 10.4 10*3/uL — ABNORMAL HIGH (ref 1.7–7.7)
Neutrophils Relative %: 76 %
Platelets: 263 10*3/uL (ref 150–400)
RBC: 3.85 MIL/uL — ABNORMAL LOW (ref 4.22–5.81)
RDW: 18 % — ABNORMAL HIGH (ref 11.5–15.5)
WBC: 13.7 10*3/uL — ABNORMAL HIGH (ref 4.0–10.5)
nRBC: 0 % (ref 0.0–0.2)

## 2022-06-25 LAB — PROTIME-INR
INR: 1.1 (ref 0.8–1.2)
Prothrombin Time: 14 seconds (ref 11.4–15.2)

## 2022-06-25 LAB — COMPREHENSIVE METABOLIC PANEL
ALT: 22 U/L (ref 0–44)
AST: 34 U/L (ref 15–41)
Albumin: 3.2 g/dL — ABNORMAL LOW (ref 3.5–5.0)
Alkaline Phosphatase: 97 U/L (ref 38–126)
Anion gap: 12 (ref 5–15)
BUN: 13 mg/dL (ref 6–20)
CO2: 19 mmol/L — ABNORMAL LOW (ref 22–32)
Calcium: 8.5 mg/dL — ABNORMAL LOW (ref 8.9–10.3)
Chloride: 104 mmol/L (ref 98–111)
Creatinine, Ser: 1.73 mg/dL — ABNORMAL HIGH (ref 0.61–1.24)
GFR, Estimated: 52 mL/min — ABNORMAL LOW (ref >60)
Glucose, Bld: 94 mg/dL (ref 70–99)
Potassium: 3.2 mmol/L — ABNORMAL LOW (ref 3.5–5.1)
Sodium: 135 mmol/L (ref 135–145)
Total Bilirubin: 0.9 mg/dL (ref 0.3–1.2)
Total Protein: 8.6 g/dL — ABNORMAL HIGH (ref 6.5–8.1)

## 2022-06-25 LAB — RESP PANEL BY RT-PCR (RSV, FLU A&B, COVID)  RVPGX2
Influenza A by PCR: NEGATIVE
Influenza B by PCR: NEGATIVE
Resp Syncytial Virus by PCR: NEGATIVE
SARS Coronavirus 2 by RT PCR: NEGATIVE

## 2022-06-25 LAB — LACTIC ACID, PLASMA
Lactic Acid, Venous: 1.2 mmol/L (ref 0.5–1.9)
Lactic Acid, Venous: 0.7 mmol/L (ref 0.5–1.9)

## 2022-06-25 MED ORDER — MORPHINE SULFATE (PF) 2 MG/ML IV SOLN: 2.0000 mg | INTRAVENOUS | Status: DC | PRN

## 2022-06-25 MED ORDER — METRONIDAZOLE 500 MG/100ML IV SOLN
500.0000 mg | Freq: Once | INTRAVENOUS | Status: AC
  Administered 2022-06-25: 500 mg via INTRAVENOUS
  Filled 2022-06-25: qty 100

## 2022-06-25 MED ORDER — IOHEXOL 300 MG/ML  SOLN
75.0000 mL | Freq: Once | INTRAMUSCULAR | Status: AC | PRN
  Administered 2022-06-25: 75 mL via INTRAVENOUS

## 2022-06-25 MED ORDER — SODIUM CHLORIDE 0.9 % IV SOLN
2.0000 g | INTRAVENOUS | Status: DC
  Administered 2022-06-26 – 2022-06-29 (×4): 2 g via INTRAVENOUS
  Filled 2022-06-25 (×2): qty 20
  Filled 2022-06-25 (×2): qty 2

## 2022-06-25 MED ORDER — VANCOMYCIN HCL 1750 MG/350ML IV SOLN
1750.0000 mg | Freq: Once | INTRAVENOUS | Status: AC
  Administered 2022-06-25: 1750 mg via INTRAVENOUS
  Filled 2022-06-25: qty 350

## 2022-06-25 MED ORDER — LACTATED RINGERS IV BOLUS (SEPSIS)
1000.0000 mL | Freq: Once | INTRAVENOUS | Status: AC
  Administered 2022-06-25: 1000 mL via INTRAVENOUS

## 2022-06-25 MED ORDER — SODIUM CHLORIDE 0.9 % IV BOLUS: 1000.0000 mL | Freq: Once | INTRAVENOUS | Status: DC

## 2022-06-25 MED ORDER — SODIUM CHLORIDE 0.9 % IV SOLN
2.0000 g | Freq: Once | INTRAVENOUS | Status: AC
  Administered 2022-06-25: 2 g via INTRAVENOUS
  Filled 2022-06-25: qty 12.5

## 2022-06-25 MED ORDER — HALOPERIDOL 0.5 MG PO TABS
5.0000 mg | ORAL_TABLET | Freq: Two times a day (BID) | ORAL | Status: DC
  Administered 2022-06-26 – 2022-06-29 (×8): 5 mg via ORAL
  Filled 2022-06-25 (×9): qty 10

## 2022-06-25 MED ORDER — TRAZODONE HCL 50 MG PO TABS
150.0000 mg | ORAL_TABLET | Freq: Every day | ORAL | Status: DC
  Administered 2022-06-26 – 2022-06-28 (×4): 150 mg via ORAL
  Filled 2022-06-25 (×5): qty 3

## 2022-06-25 MED ORDER — AZITHROMYCIN 500 MG PO TABS
500.0000 mg | ORAL_TABLET | Freq: Every day | ORAL | Status: DC
  Administered 2022-06-26 – 2022-06-29 (×4): 500 mg via ORAL
  Filled 2022-06-25 (×4): qty 1

## 2022-06-25 MED ORDER — ONDANSETRON HCL 4 MG/2ML IJ SOLN: 4.0000 mg | Freq: Four times a day (QID) | INTRAMUSCULAR | Status: DC | PRN

## 2022-06-25 MED ORDER — VANCOMYCIN HCL IN DEXTROSE 1-5 GM/200ML-% IV SOLN: 1000.0000 mg | Freq: Once | INTRAVENOUS | Status: DC

## 2022-06-25 MED ORDER — LACTATED RINGERS IV SOLN
150.0000 mL/h | INTRAVENOUS | Status: AC
  Administered 2022-06-26: 150 mL/h via INTRAVENOUS

## 2022-06-25 MED ORDER — ONDANSETRON HCL 4 MG PO TABS: 4.0000 mg | ORAL_TABLET | Freq: Four times a day (QID) | ORAL | Status: DC | PRN

## 2022-06-25 NOTE — Consult Note (Signed)
PHARMACY -  BRIEF ANTIBIOTIC NOTE   Pharmacy has received consult(s) for vancomycin and cefepime from an ED provider.  The patient's profile has been reviewed for ht/wt/allergies/indication/available labs.    One time order(s) placed for cefepime 2 g and vancomycin 1750 mg  Further antibiotics/pharmacy consults should be ordered by admitting physician if indicated.                       Thank you, Vick Frees, PharmD 06/25/2022  8:31 PM

## 2022-06-25 NOTE — Assessment & Plan Note (Deleted)
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

## 2022-06-25 NOTE — ED Notes (Signed)
Transported to CT 

## 2022-06-25 NOTE — Assessment & Plan Note (Signed)
Hemoglobin at baseline at 9.7.  Baseline 8.8-9.9

## 2022-06-25 NOTE — ED Triage Notes (Signed)
Pt to ED from home by [employer]. Called out by mother bc patient was "freaking out", [employer] advised they felt like it was excited delirium. Pt was not making any sense and incoherent and seeing things on scene. Mom advised he has not been taking his medications at home and was getting aggressive with mother at home.   Pt is NOT under IVC at this time but is brought in handcuffs.   Pt denies SI/HI.  Pt has large swelling to neck and airway. Pt denies any other symptoms.

## 2022-06-25 NOTE — Progress Notes (Signed)
Pt being followed by [facility] for Sepsis protocol. 

## 2022-06-25 NOTE — Assessment & Plan Note (Signed)
Previously on tivicay-truvada regimen when last seen by ID December 2023

## 2022-06-25 NOTE — ED Provider Notes (Signed)
[facility] Provider Note    Event Date/Time   First MD Initiated Contact with Patient 06/25/22 2000     (approximate)  History   Chief Complaint: Psychiatric Evaluation and Facial Swelling  HPI  Michael Chandler is a 37 y.o. male with a past medical history of HIV, schizophrenia, presents to the emergency department for medical evaluation.  According to police they were initially called out to the patient's residence for him acting agitated and in a manic-like state.  When they went to the residence they found the patient to be much more calm however they found him to be quite tachycardic around 160 bpm his blood pressure was low when he had a large abscess to the left side of his neck.  Patient states the abscess or swollen area to the left neck has been present since October.  Here the patient is calm, he is answering questions appropriately including orientation questions.  Patient is somewhat slow in his responses but they appear to be accurate.  Patient was able to tell me the swelling in the left neck has been present since October but recently has been draining.  Denies any known fever.  Patient found to be hypotensive 82/46 in the emergency department tachycardic to 160 and tachypneic to 24 breaths/min.  Physical Exam   Triage Vital Signs: ED Triage Vitals  Enc Vitals Group     BP 06/25/22 2000 (!) 82/46     Pulse Rate 06/25/22 2000 (!) 160     Resp 06/25/22 2000 (!) 24     Temp 06/25/22 2000 98.9 F (37.2 C)     Temp Source 06/25/22 2000 Oral     SpO2 06/25/22 2000 99 %     Weight 06/25/22 1954 205 lb 0.4 oz (93 kg)     Height 06/25/22 1954 '6\' 5"'$  (1.956 m)     Head Circumference --      Peak Flow --      Pain Score 06/25/22 1954 0     Pain Loc --      Pain Edu? --      Excl. in [place]? --     Most recent vital signs: Vitals:   06/25/22 2000  BP: (!) 82/46  Pulse: (!) 160  Resp: (!) 24  Temp: 98.9 F (37.2 C)  SpO2: 99%    General: Awake, no  distress.  CV:  Good peripheral perfusion.  Regular rate and rhythm  Resp:  Normal effort.  Equal breath sounds bilaterally.  Abd:  No distention.  Soft, nontender.  No rebound or guarding. Other:  Patient has a tender swollen area to the left anterior neck   ED Results / Procedures / Treatments   EKG  EKG viewed and interpreted by myself shows sinus tachycardia 118 bpm with a narrow QRS, normal axis, normal intervals, no concerning ST changes.  RADIOLOGY  I have reviewed the CT images of the neck patient appears to have significant swelling left side the neck possible abscess to this area. Radiology is read the CT scan is extensive soft tissue swelling and inflammation consistent with cellulitis also several areas consistent with abscess.  As well as significant lymphadenopathy of the left side.   MEDICATIONS ORDERED IN ED: Medications  sodium chloride 0.9 % bolus 1,000 mL (has no administration in time range)    IMPRESSION / MDM / ASSESSMENT AND PLAN / ED COURSE  I reviewed the triage vital signs and the nursing notes.  Patient's presentation is  most consistent with acute presentation with potential threat to life or bodily function.  Patient presents emergency department for medical evaluation due to tachycardia low blood pressure and a mass to the left side of his neck.  Initially called out to the scene per report for agitation" delirium."  The patient has since calm down but due to his abnormal vitals and left neck mass they brought the patient to the emergency department for evaluation.  Here the patient is hypotensive tachycardic and tachypneic.  Patient has what appears to be an abscess for cellulitis to the left anterior neck.  Given the patient's tachycardia tachypnea hypotension and likely infectious source we will start the patient on broad-spectrum antibiotics for likely sepsis obtain labs, cultures obtain a CT scan with contrast of the neck.  Patient agreeable to plan.   Currently calm and cooperative.  CT is consistent with significant swelling signs of cellulitis and deeper abscess.  Patient's white blood cell count is elevated at 13.7 again consistent with sepsis, chemistry shows mild renal insufficiency otherwise no concerning findings.  COVID and flu as well as RSV are negative.  Given the patient's elevated white blood cell count CT findings we will continue on IV antibiotics and admit to the hospital service for further workup and treatment.  Patient agreeable to plan of care.    CRITICAL CARE Performed by: Harvest Dark   Total critical care time: 30 minutes  Critical care time was exclusive of separately billable procedures and treating other patients.  Critical care was necessary to treat or prevent imminent or life-threatening deterioration.  Critical care was time spent personally by me on the following activities: development of treatment plan with patient and/or surrogate as well as nursing, discussions with consultants, evaluation of patient's response to treatment, examination of patient, obtaining history from patient or surrogate, ordering and performing treatments and interventions, ordering and review of laboratory studies, ordering and review of radiographic studies, pulse oximetry and re-evaluation of patient's condition.   FINAL CLINICAL IMPRESSION(S) / ED DIAGNOSES   Sepsis Cellulitis   Note:  This document was prepared using Dragon voice recognition software and may include unintentional dictation errors.   Harvest Dark, MD 06/25/22 2207

## 2022-06-25 NOTE — H&P (Signed)
History and Physical    Patient: Michael Chandler DOB: July 25, 1985 DOA: 06/25/2022 DOS: the patient was seen and examined on 06/25/2022 PCP: Pcp, No  Patient coming from: Home  Chief Complaint:  Chief Complaint  Patient presents with   Psychiatric Evaluation   Facial Swelling    HPI: Michael Chandler is a 38 y.o. 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  Upon arrival to the ED patient was calm unable to contribute to history.  He was noted to have a swelling on the left side of his neck which has been ongoing for months but recently started draining.  He denied fever. ED course and data review: On arrival BP 82/46 and pulse 160 with respirations 24.  Afebrile.  O2 sat 99% on room air.  Labs notable for WBC of 13,700 with lactic acid 1.2.  Creatinine 1.73, up from baseline of 0.86, with bicarb of 19.  Potassium 3.2.  Hemoglobin 9.7 which is around his baseline.  Respiratory viral panel negative. CT soft tissue neck consistent with cellulitis and abscess with extensive left-sided cervical adenopathy and prior fusion with no visible hardware complication as outlined as follows: IMPRESSION: 1. Extensive soft tissue swelling with inflammatory stranding throughout the anterolateral left neck, concerning for acute soft tissue infection/cellulitis. Multiple superimposed hypodense collections within this region measure up to 3.3 cm, concerning for abscesses. 2. Superimposed  extensive left-sided cervical adenopathy. Internal hypodensity within a few of these nodes consistent with suppuration and/or necrosis. While these findings are favored to reflect changes of infection, clinical follow-up to resolution recommended and to ensure no underlying neoplastic process is present. Correlation with dedicated nodal histologic sampling could be performed for further evaluation as warranted. 3. Prior fusion at C4-C7 with C5 and C6 corpectomy. Prior posterior decompression with fusion at C3 through T1. No visible hardware complication.  The ED provider spoke with ENT, Dr. Richardson Landry who will see patient in the a.m. and also recommended ID consult to follow based on history.  Patient started on vancomycin, cefepime and Flagyl and given sepsis fluid bolus.  Hospitalist consulted for admission.   Review of Systems: As mentioned in the history of present illness. All other systems reviewed and are negative.  Past Medical History:  Diagnosis Date   HIV (human immunodeficiency virus infection) ([place])    Schizophrenia ([place])    per IVC paperwork, pt states does not have this dx   Past Surgical History:  Procedure Laterality Date   IR US GUIDE BX ASP/DRAIN  03/21/2022   Social History:  reports that he has quit smoking. His smoking use included cigarettes. He has never used smokeless tobacco. He reports current alcohol use. He reports current drug use. Drugs: Methamphetamines, Marijuana, and Cocaine.  Allergies  Allergen Reactions   Bactrim [Sulfamethoxazole-Trimethoprim] Anaphylaxis    History reviewed. No pertinent family history.  Prior to Admission medications   Medication Sig Start Date End Date Taking? Authorizing Provider  benztropine (COGENTIN) 1 MG tablet Take 1 tablet (1 mg total) by mouth daily. 05/10/22 06/09/22  Carrie Mew, MD  dolutegravir [facility]) 50  MG tablet Take 1 tablet (50 mg total) by mouth daily. 04/09/22   George Hugh, MD  folic acid  (FOLVITE) 1 MG tablet Take 1 tablet (1 mg total) by mouth daily. Patient not taking: Reported on 04/20/2022 03/22/22 03/22/23  Shelly Coss, MD  gabapentin (NEURONTIN) 300 MG capsule Take 1 capsule (300 mg total) by mouth 3 (three) times daily. 05/10/22 06/09/22  Carrie Mew, MD  haloperidol (HALDOL) 5 MG tablet Take 1 tablet (5 mg total) by mouth 2 (two) times daily. 05/10/22 06/09/22  Carrie Mew, MD  mirtazapine (REMERON SOL-TAB) 15 MG disintegrating tablet Take 1 tablet (15 mg total) by mouth at bedtime. Patient not taking: Reported on 05/10/2022 03/07/22   Oneida Alar A, NP  thiamine (VITAMIN B-1) 100 MG tablet Take 1 tablet (100 mg total) by mouth daily. Patient not taking: Reported on 04/20/2022 03/23/22   Shelly Coss, MD  traZODone (DESYREL) 150 MG tablet Take 1 tablet (150 mg total) by mouth at bedtime. 05/10/22 06/09/22  Carrie Mew, MD    Physical Exam: Vitals:   06/25/22 2110 06/25/22 2124 06/25/22 2130 06/25/22 2215  BP: 114/67  (!) 94/52 107/66  Pulse: (!) 103 97 99 90  Resp: 18 18 (!) 22 18  Temp:      TempSrc:      SpO2: 97% 94% 98% 100%  Weight:      Height:       Physical Exam Vitals and nursing note reviewed.  Constitutional:      General: He is not in acute distress. HENT:     Head: Normocephalic and atraumatic.  Neck:     Comments: See pic below Cardiovascular:     Rate and Rhythm: Regular rhythm. Tachycardia present.     Heart sounds: Normal heart sounds.  Pulmonary:     Effort: Pulmonary effort is normal.     Breath sounds: Normal breath sounds.  Abdominal:     Palpations: Abdomen is soft.     Tenderness: There is no abdominal tenderness.  Lymphadenopathy:     Cervical: Cervical adenopathy present.     Left cervical: Superficial cervical adenopathy present.  Neurological:     Mental Status: Mental status is at baseline.     Labs on Admission: I have personally reviewed following labs and imaging studies  CBC: Recent  Labs  Lab 06/25/22 1958  WBC 13.7*  NEUTROABS 10.4*  HGB 9.7*  HCT 31.9*  MCV 82.9  PLT 99991111   Basic Metabolic Panel: Recent Labs  Lab 06/25/22 1958  NA 135  K 3.2*  CL 104  CO2 19*  GLUCOSE 94  BUN 13  CREATININE 1.73*  CALCIUM 8.5*   GFR: Estimated Creatinine Clearance: 75.1 mL/min (A) (by C-G formula based on SCr of 1.73 mg/dL (H)). Liver Function Tests: Recent Labs  Lab 06/25/22 1958  AST 34  ALT 22  ALKPHOS 97  BILITOT 0.9  PROT 8.6*  ALBUMIN 3.2*   No results for input(s): "LIPASE", "AMYLASE" in the last 168 hours. No results for input(s): "AMMONIA" in the last 168 hours. Coagulation Profile: Recent Labs  Lab 06/25/22 2027  INR 1.1   Cardiac Enzymes: No results for input(s): "CKTOTAL", "CKMB", "CKMBINDEX", "TROPONINI" in the last 168 hours. BNP (last 3 results) No results for input(s): "PROBNP" in the last 8760 hours. HbA1C: No results for input(s): "HGBA1C" in the last 72 hours. CBG: No results for input(s): "GLUCAP" in the last 168 hours. Lipid Profile: No results for input(s): "CHOL", "HDL", "LDLCALC", "TRIG", "CHOLHDL", "  LDLDIRECT" in the last 72 hours. Thyroid Function Tests: No results for input(s): "TSH", "T4TOTAL", "FREET4", "T3FREE", "THYROIDAB" in the last 72 hours. Anemia Panel: No results for input(s): "VITAMINB12", "FOLATE", "FERRITIN", "TIBC", "IRON", "RETICCTPCT" in the last 72 hours. Urine analysis:    Component Value Date/Time   COLORURINE YELLOW (A) 04/03/2022 2115   APPEARANCEUR CLEAR (A) 04/03/2022 2115   LABSPEC 1.016 04/03/2022 2115   PHURINE 7.0 04/03/2022 2115   GLUCOSEU NEGATIVE 04/03/2022 2115   HGBUR NEGATIVE 04/03/2022 2115   BILIRUBINUR NEGATIVE 04/03/2022 2115   Greasy NEGATIVE 04/03/2022 2115   PROTEINUR 30 (A) 04/03/2022 2115   NITRITE NEGATIVE 04/03/2022 2115   LEUKOCYTESUR NEGATIVE 04/03/2022 2115    Radiological Exams on Admission: CT Soft Tissue Neck W Contrast  Result Date: 06/25/2022 CLINICAL  DATA:  Initial evaluation for soft tissue infection. EXAM: CT NECK WITH CONTRAST TECHNIQUE: Multidetector CT imaging of the neck was performed using the standard protocol following the bolus administration of intravenous contrast. RADIATION DOSE REDUCTION: This exam was performed according to the departmental dose-optimization program which includes automated exposure control, adjustment of the mA and/or kV according to patient size and/or use of iterative reconstruction technique. CONTRAST:  36m OMNIPAQUE IOHEXOL 300 MG/ML  SOLN COMPARISON:  Prior study from 03/15/2022. FINDINGS: Pharynx and larynx: Oral cavity within normal limits. Oropharynx and nasopharynx within normal limits. No retropharyngeal collection. Epiglottis within normal limits. Hypopharynx and supraglottic larynx within normal limits. Negative glottis. Subglottic airway patent clear. Salivary glands: Parotid glands and right submandibular gland are normal. Inflammatory changes partially surround the left submandibular gland related to the acute inflammatory process within the left neck. Gland itself felt to be within normal limits. Thyroid: Normal. Lymph nodes: Multiple enlarged lymph nodes and/or nodal conglomerate seen extending along the left cervical chain. For reference purposes, the largest node measures up to approximately 2.8 cm in short axis at level 2 (series 2, image 49). Few scattered areas of internal hypodensity consistent with internal suppuration and/or necrosis. Extensive overlying soft tissue swelling with inflammatory stranding within the adjacent anterolateral left neck. The left sternocleidomastoid muscle is thickened and edematous. Superimposed hypodense collection at the posterior margin of the left SCM at the left lateral neck measures 1.9 x 1.4 x 2.0 cm, consistent with abscess (series 2, image 61). Additional collection located superiorly within the left posterior paraspinous musculature measures 2.0 x 1.3 x 3.3 cm (series  2, image 42). An additional probable collection positioned along the deep aspect of the left sternocleidomastoid muscle measures approximately 2.5 x 1.5 x 1.0 cm (series 2, image 65). Few additional scattered subcentimeter hypodensities noted within this region, likely small micro abscesses. No pathologic adenopathy seen within the contralateral right neck. Vascular: Normal intravascular enhancement seen throughout the neck. Left IJ is partially compressed by the left-sided adenopathy. Limited intracranial: Unremarkable. Visualized orbits: Unremarkable. Mastoids and visualized paranasal sinuses: Mild mucosal thickening present about the left maxillary sinus. Visualized paranasal sinuses are otherwise largely clear. Trace left mastoid effusion noted. Middle ear cavities are clear. Skeleton: No discrete or worrisome osseous lesions. Prior fusion at C4-C7 with C5 and C6 corpectomy. Prior posterior decompression with fusion at C3 through T1. No visible hardware complication. Upper chest: Visualized upper chest demonstrates no acute finding. Small bulla noted at the medial left upper lobe. Other: None. IMPRESSION: 1. Extensive soft tissue swelling with inflammatory stranding throughout the anterolateral left neck, concerning for acute soft tissue infection/cellulitis. Multiple superimposed hypodense collections within this region measure up to 3.3 cm, concerning for  abscesses. 2. Superimposed extensive left-sided cervical adenopathy. Internal hypodensity within a few of these nodes consistent with suppuration and/or necrosis. While these findings are favored to reflect changes of infection, clinical follow-up to resolution recommended and to ensure no underlying neoplastic process is present. Correlation with dedicated nodal histologic sampling could be performed for further evaluation as warranted. 3. Prior fusion at C4-C7 with C5 and C6 corpectomy. Prior posterior decompression with fusion at C3 through T1. No visible  hardware complication. Electronically Signed   By: Jeannine Boga M.D.   On: 06/25/2022 21:28   DG Chest Port 1 View  Result Date: 06/25/2022 CLINICAL DATA:  Suspected sepsis EXAM: PORTABLE CHEST 1 VIEW COMPARISON:  04/03/2022 FINDINGS: Cardiac shadow is stable. The lungs are well aerated bilaterally without focal infiltrate or effusion. No acute bony abnormality is noted. IMPRESSION: No active disease. Electronically Signed   By: Inez Catalina M.D.   On: 06/25/2022 20:30     Data Reviewed: Relevant notes from primary care and specialist visits, past discharge summaries as available in EHR, including Care Everywhere. 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 IR consulted for possible drainage Please consult ID in the a.m. Pain medication Will keep n.p.o. from midnight and hold DVT chemoprophylaxis in case of procedure  AKI (acute kidney injury) ([place]) Metabolic acidosis Secondary to sepsis Should improve with IV fluid resuscitation IV hydration and monitor renal function Lab Results  Component Value Date   CREATININE 1.73 (H) 06/25/2022   CREATININE 0.86 05/21/2022   CREATININE 1.06 05/10/2022      HIV/AIDS (human immunodeficiency virus infection) ([place]) Previously on tivicay-truvada regimen when last seen by  ID December 2023  Schizophrenia, undifferentiated ([place]) Depression Continue Haldol, Cogentin, Remeron and trazodone, though compliance uncertain  H/O 99991111 discitis and MSSA epidural abscess s/p surgical decompression and fusion with hardware 2021 No visible hardware complication on CT  Chronic anemia Hemoglobin at baseline at 9.7.  Baseline 8.8-9.9        DVT prophylaxis: SCD  Consults: ENT, Dr. Richardson Landry, IR.  ID to be consulted in the a.m.  Advance Care Planning:   Code Status: Prior   Family Communication: none  Disposition Plan: Back to previous home environment  Severity of Illness: The appropriate patient status for this patient is INPATIENT. Inpatient status is judged to be reasonable and necessary in order to provide the required intensity of service to ensure the patient's safety. The patient's presenting symptoms, physical exam findings, and initial radiographic and laboratory data in the context of their chronic comorbidities is felt to place them at high risk for further clinical deterioration. Furthermore, it is not anticipated that the patient will be medically stable for discharge from the hospital within 2 midnights of admission.   * I certify that at the point of admission it is my clinical judgment that the patient will require inpatient hospital care spanning beyond 2 midnights from the point of admission due to high intensity  of service, high risk for further deterioration and high frequency of surveillance required.*  Author: Athena Masse, MD 06/25/2022 10:23 PM  For on call review [email or web address].

## 2022-06-25 NOTE — Assessment & Plan Note (Addendum)
No visible hardware complication on CT

## 2022-06-25 NOTE — Progress Notes (Signed)
CODE SEPSIS - PHARMACY COMMUNICATION  **Broad Spectrum Antibiotics should be administered within 1 hour of Sepsis diagnosis**  Time Code Sepsis Called/Page Received: 2020  Antibiotics Ordered: 2020  Time of 1st antibiotic administration: 2034  Additional action taken by pharmacy: N/A  If necessary, Name of Provider/Nurse Contacted: [place] ,PharmD Clinical Pharmacist  06/25/2022  8:28 PM

## 2022-06-25 NOTE — H&P (Incomplete)
History and Physical    Patient: Michael Chandler DOB: February 02, 1986 DOA: 06/25/2022 DOS: the patient was seen and examined on 06/25/2022 PCP: Pcp, No  Patient coming from: Home  Chief Complaint:  Chief Complaint  Patient presents with  . Psychiatric Evaluation  . Facial Swelling    HPI: Michael Chandler is a 36 y.o. 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  Upon arrival to the ED patient was calm unable to contribute to history.  He was noted to have a swelling on the left side of his neck which has been ongoing for months but recently started draining.  He denied fever. ED course and data review: On arrival BP 82/46 and pulse 160 with respirations 24.  Afebrile.  O2 sat 99% on room air.  Labs notable for WBC of 13,700 with lactic acid 1.2.  Creatinine 1.73, up from baseline of 0.86, with bicarb of 19.  Potassium 3.2.  Hemoglobin 9.7 which is around his baseline.  Respiratory viral panel negative. CT soft tissue neck consistent with cellulitis and abscess with extensive left-sided cervical adenopathy and prior fusion with no visible hardware complication as outlined as follows: IMPRESSION: 1. Extensive soft tissue swelling with inflammatory stranding throughout the anterolateral left neck, concerning for acute soft tissue infection/cellulitis. Multiple superimposed hypodense collections within this region measure up to 3.3 cm, concerning for abscesses. 2. Superimposed  extensive left-sided cervical adenopathy. Internal hypodensity within a few of these nodes consistent with suppuration and/or necrosis. While these findings are favored to reflect changes of infection, clinical follow-up to resolution recommended and to ensure no underlying neoplastic process is present. Correlation with dedicated nodal histologic sampling could be performed for further evaluation as warranted. 3. Prior fusion at C4-C7 with C5 and C6 corpectomy. Prior posterior decompression with fusion at C3 through T1. No visible hardware complication.  The ED provider spoke with ENT, Dr. Richardson Landry who will see patient in the a.m. and also recommended ID consult to follow based on history.  Patient started on vancomycin, cefepime and Flagyl and given sepsis fluid bolus.  Hospitalist consulted for admission.   Review of Systems: As mentioned in the history of present illness. All other systems reviewed and are negative.  Past Medical History:  Diagnosis Date  . HIV (human immunodeficiency virus infection) ([place])   . Schizophrenia ([place])    per IVC paperwork, pt states does not have this dx   Past Surgical History:  Procedure Laterality Date  . IR US GUIDE BX ASP/DRAIN  03/21/2022   Social History:  reports that he has quit smoking. His smoking use included cigarettes. He has never used smokeless tobacco. He reports current alcohol use. He reports current drug use. Drugs: Methamphetamines, Marijuana, and Cocaine.  Allergies  Allergen Reactions  . Bactrim [Sulfamethoxazole-Trimethoprim] Anaphylaxis    History reviewed. No pertinent family history.  Prior to Admission medications   Medication Sig Start Date End Date Taking? Authorizing Provider  benztropine (COGENTIN) 1 MG tablet Take 1 tablet (1 mg total) by mouth daily. 05/10/22 06/09/22  Carrie Mew, MD  dolutegravir [facility]) 50  MG tablet Take 1 tablet (50 mg total) by mouth daily. 04/09/22   George Hugh, MD  folic acid  (FOLVITE) 1 MG tablet Take 1 tablet (1 mg total) by mouth daily. Patient not taking: Reported on 04/20/2022 03/22/22 03/22/23  Shelly Coss, MD  gabapentin (NEURONTIN) 300 MG capsule Take 1 capsule (300 mg total) by mouth 3 (three) times daily. 05/10/22 06/09/22  Carrie Mew, MD  haloperidol (HALDOL) 5 MG tablet Take 1 tablet (5 mg total) by mouth 2 (two) times daily. 05/10/22 06/09/22  Carrie Mew, MD  mirtazapine (REMERON SOL-TAB) 15 MG disintegrating tablet Take 1 tablet (15 mg total) by mouth at bedtime. Patient not taking: Reported on 05/10/2022 03/07/22   Oneida Alar A, NP  thiamine (VITAMIN B-1) 100 MG tablet Take 1 tablet (100 mg total) by mouth daily. Patient not taking: Reported on 04/20/2022 03/23/22   Shelly Coss, MD  traZODone (DESYREL) 150 MG tablet Take 1 tablet (150 mg total) by mouth at bedtime. 05/10/22 06/09/22  Carrie Mew, MD    Physical Exam: Vitals:   06/25/22 2110 06/25/22 2124 06/25/22 2130 06/25/22 2215  BP: 114/67  (!) 94/52 107/66  Pulse: (!) 103 97 99 90  Resp: 18 18 (!) 22 18  Temp:      TempSrc:      SpO2: 97% 94% 98% 100%  Weight:      Height:       Physical Exam  Labs on Admission: I have personally reviewed following labs and imaging studies  CBC: Recent Labs  Lab 06/25/22 1958  WBC 13.7*  NEUTROABS 10.4*  HGB 9.7*  HCT 31.9*  MCV 82.9  PLT 99991111   Basic Metabolic Panel: Recent Labs  Lab 06/25/22 1958  NA 135  K 3.2*  CL 104  CO2 19*  GLUCOSE 94  BUN 13  CREATININE 1.73*  CALCIUM 8.5*   GFR: Estimated Creatinine Clearance: 75.1 mL/min (A) (by C-G formula based on SCr of 1.73 mg/dL (H)). Liver Function Tests: Recent Labs  Lab 06/25/22 1958  AST 34  ALT 22  ALKPHOS 97  BILITOT 0.9  PROT 8.6*  ALBUMIN 3.2*   No results for input(s): "LIPASE", "AMYLASE" in the last 168 hours. No results for input(s): "AMMONIA" in the last 168 hours. Coagulation Profile: Recent Labs  Lab 06/25/22 2027  INR 1.1    Cardiac Enzymes: No results for input(s): "CKTOTAL", "CKMB", "CKMBINDEX", "TROPONINI" in the last 168 hours. BNP (last 3 results) No results for input(s): "PROBNP" in the last 8760 hours. HbA1C: No results for input(s): "HGBA1C" in the last 72 hours. CBG: No results for input(s): "GLUCAP" in the last 168 hours. Lipid Profile: No results for input(s): "CHOL", "HDL", "LDLCALC", "TRIG", "CHOLHDL", "LDLDIRECT" in the last 72 hours. Thyroid Function Tests: No results for input(s): "TSH", "T4TOTAL", "FREET4", "T3FREE", "THYROIDAB" in the last 72 hours. Anemia Panel: No results for input(s): "VITAMINB12", "FOLATE", "FERRITIN", "TIBC", "IRON", "RETICCTPCT" in the last 72 hours. Urine analysis:    Component Value Date/Time   COLORURINE YELLOW (A) 04/03/2022 2115   APPEARANCEUR CLEAR (A) 04/03/2022 2115   LABSPEC 1.016 04/03/2022 2115   PHURINE 7.0 04/03/2022 2115   GLUCOSEU NEGATIVE 04/03/2022 2115   HGBUR NEGATIVE 04/03/2022 2115   BILIRUBINUR NEGATIVE 04/03/2022 2115   [place] NEGATIVE 04/03/2022 2115   PROTEINUR 30 (A) 04/03/2022 2115   NITRITE NEGATIVE 04/03/2022 2115   LEUKOCYTESUR NEGATIVE 04/03/2022 2115    Radiological Exams on Admission: CT Soft Tissue Neck W Contrast  Result Date: 06/25/2022 CLINICAL  DATA:  Initial evaluation for soft tissue infection. EXAM: CT NECK WITH CONTRAST TECHNIQUE: Multidetector CT imaging of the neck was performed using the standard protocol following the bolus administration of intravenous contrast. RADIATION DOSE REDUCTION: This exam was performed according to the departmental dose-optimization program which includes automated exposure control, adjustment of the mA and/or kV according to patient size and/or use of iterative reconstruction technique. CONTRAST:  89m OMNIPAQUE IOHEXOL 300 MG/ML  SOLN COMPARISON:  Prior study from 03/15/2022. FINDINGS: Pharynx and larynx: Oral cavity within normal limits. Oropharynx and nasopharynx within normal  limits. No retropharyngeal collection. Epiglottis within normal limits. Hypopharynx and supraglottic larynx within normal limits. Negative glottis. Subglottic airway patent clear. Salivary glands: Parotid glands and right submandibular gland are normal. Inflammatory changes partially surround the left submandibular gland related to the acute inflammatory process within the left neck. Gland itself felt to be within normal limits. Thyroid: Normal. Lymph nodes: Multiple enlarged lymph nodes and/or nodal conglomerate seen extending along the left cervical chain. For reference purposes, the largest node measures up to approximately 2.8 cm in short axis at level 2 (series 2, image 49). Few scattered areas of internal hypodensity consistent with internal suppuration and/or necrosis. Extensive overlying soft tissue swelling with inflammatory stranding within the adjacent anterolateral left neck. The left sternocleidomastoid muscle is thickened and edematous. Superimposed hypodense collection at the posterior margin of the left SCM at the left lateral neck measures 1.9 x 1.4 x 2.0 cm, consistent with abscess (series 2, image 61). Additional collection located superiorly within the left posterior paraspinous musculature measures 2.0 x 1.3 x 3.3 cm (series 2, image 42). An additional probable collection positioned along the deep aspect of the left sternocleidomastoid muscle measures approximately 2.5 x 1.5 x 1.0 cm (series 2, image 65). Few additional scattered subcentimeter hypodensities noted within this region, likely small micro abscesses. No pathologic adenopathy seen within the contralateral right neck. Vascular: Normal intravascular enhancement seen throughout the neck. Left IJ is partially compressed by the left-sided adenopathy. Limited intracranial: Unremarkable. Visualized orbits: Unremarkable. Mastoids and visualized paranasal sinuses: Mild mucosal thickening present about the left maxillary sinus. Visualized  paranasal sinuses are otherwise largely clear. Trace left mastoid effusion noted. Middle ear cavities are clear. Skeleton: No discrete or worrisome osseous lesions. Prior fusion at C4-C7 with C5 and C6 corpectomy. Prior posterior decompression with fusion at C3 through T1. No visible hardware complication. Upper chest: Visualized upper chest demonstrates no acute finding. Small bulla noted at the medial left upper lobe. Other: None. IMPRESSION: 1. Extensive soft tissue swelling with inflammatory stranding throughout the anterolateral left neck, concerning for acute soft tissue infection/cellulitis. Multiple superimposed hypodense collections within this region measure up to 3.3 cm, concerning for abscesses. 2. Superimposed extensive left-sided cervical adenopathy. Internal hypodensity within a few of these nodes consistent with suppuration and/or necrosis. While these findings are favored to reflect changes of infection, clinical follow-up to resolution recommended and to ensure no underlying neoplastic process is present. Correlation with dedicated nodal histologic sampling could be performed for further evaluation as warranted. 3. Prior fusion at C4-C7 with C5 and C6 corpectomy. Prior posterior decompression with fusion at C3 through T1. No visible hardware complication. Electronically Signed   By: BJeannine BogaM.D.   On: 06/25/2022 21:28   DG Chest Port 1 View  Result Date: 06/25/2022 CLINICAL DATA:  Suspected sepsis EXAM: PORTABLE CHEST 1 VIEW COMPARISON:  04/03/2022 FINDINGS: Cardiac shadow is stable. The lungs are well aerated bilaterally without focal infiltrate or effusion.  No acute bony abnormality is noted. IMPRESSION: No active disease. Electronically Signed   By: Inez Catalina M.D.   On: 06/25/2022 20:30     Data Reviewed: Relevant notes from primary care and specialist visits, past discharge summaries as available in EHR, including Care Everywhere. 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 IR consulted for possible drainage Please consult ID in the a.m. Pain medication Will keep n.p.o. from midnight and hold DVT chemoprophylaxis in case of procedure  AKI (acute kidney injury) ([place]) Metabolic acidosis Secondary to sepsis Should improve with IV fluid resuscitation IV hydration and monitor renal function Lab Results  Component Value Date   CREATININE 1.73 (H) 06/25/2022   CREATININE 0.86 05/21/2022   CREATININE 1.06 05/10/2022      HIV/AIDS (human immunodeficiency virus infection) ([place]) Previously on tivicay-truvada regimen when last seen by ID December 2023  Schizophrenia, undifferentiated ([place]) Depression Continue Haldol, Cogentin, Remeron and trazodone, though compliance uncertain  H/O 99991111 discitis and MSSA epidural abscess s/p surgical decompression and fusion with hardware 2021 No visible hardware complication on CT  Chronic anemia Hemoglobin at baseline at 9.7.  Baseline 8.8-9.9        DVT prophylaxis: SCD  Consults: ENT, Dr. Richardson Landry, IR.  ID to be consulted in the a.m.  Advance Care Planning:   Code Status: Prior   Family Communication: none  Disposition Plan: Back to previous home environment  Severity of Illness: The appropriate patient status for this patient is  INPATIENT. Inpatient status is judged to be reasonable and necessary in order to provide the required intensity of service to ensure the patient's safety. The patient's presenting symptoms, physical exam findings, and initial radiographic and laboratory data in the context of their chronic comorbidities is felt to place them at high risk for further clinical deterioration. Furthermore, it is not anticipated that the patient will be medically stable for discharge from the hospital within 2 midnights of admission.   * I certify that at the point of admission it is my clinical judgment that the patient will require inpatient hospital care spanning beyond 2 midnights from the point of admission due to high intensity of service, high risk for further deterioration and high frequency of surveillance required.*  Author: Athena Masse, MD 06/25/2022 10:23 PM  For on call review [email or web address].

## 2022-06-25 NOTE — Assessment & Plan Note (Addendum)
Metabolic acidosis Secondary to sepsis Should improve with IV fluid resuscitation IV hydration and monitor renal function Lab Results  Component Value Date   CREATININE 1.73 (H) 06/25/2022   CREATININE 0.86 05/21/2022   CREATININE 1.06 05/10/2022

## 2022-06-25 NOTE — Assessment & Plan Note (Addendum)
Depression Continue Haldol, Cogentin, Remeron and trazodone, though compliance uncertain

## 2022-06-26 DIAGNOSIS — F203 Undifferentiated schizophrenia: Secondary | ICD-10-CM

## 2022-06-26 DIAGNOSIS — I881 Chronic lymphadenitis, except mesenteric: Secondary | ICD-10-CM | POA: Diagnosis not present

## 2022-06-26 DIAGNOSIS — A310 Pulmonary mycobacterial infection: Secondary | ICD-10-CM | POA: Diagnosis not present

## 2022-06-26 DIAGNOSIS — B20 Human immunodeficiency virus [HIV] disease: Secondary | ICD-10-CM

## 2022-06-26 DIAGNOSIS — R6520 Severe sepsis without septic shock: Secondary | ICD-10-CM | POA: Diagnosis not present

## 2022-06-26 DIAGNOSIS — A419 Sepsis, unspecified organism: Secondary | ICD-10-CM | POA: Diagnosis not present

## 2022-06-26 DIAGNOSIS — N179 Acute kidney failure, unspecified: Secondary | ICD-10-CM | POA: Diagnosis not present

## 2022-06-26 LAB — BASIC METABOLIC PANEL
Anion gap: 6 (ref 5–15)
BUN: 12 mg/dL (ref 6–20)
CO2: 23 mmol/L (ref 22–32)
Calcium: 8.3 mg/dL — ABNORMAL LOW (ref 8.9–10.3)
Chloride: 107 mmol/L (ref 98–111)
Creatinine, Ser: 1.34 mg/dL — ABNORMAL HIGH (ref 0.61–1.24)
GFR, Estimated: >60 mL/min (ref >60)
Glucose, Bld: 86 mg/dL (ref 70–99)
Potassium: 3.4 mmol/L — ABNORMAL LOW (ref 3.5–5.1)
Sodium: 136 mmol/L (ref 135–145)

## 2022-06-26 LAB — CBC
HCT: 28.5 % — ABNORMAL LOW (ref 39.0–52.0)
Hemoglobin: 9 g/dL — ABNORMAL LOW (ref 13.0–17.0)
MCH: 24.9 pg — ABNORMAL LOW (ref 26.0–34.0)
MCHC: 31.6 g/dL (ref 30.0–36.0)
MCV: 78.9 fL — ABNORMAL LOW (ref 80.0–100.0)
Platelets: 273 10*3/uL (ref 150–400)
RBC: 3.61 MIL/uL — ABNORMAL LOW (ref 4.22–5.81)
RDW: 17.7 % — ABNORMAL HIGH (ref 11.5–15.5)
WBC: 8.2 10*3/uL (ref 4.0–10.5)
nRBC: 0 % (ref 0.0–0.2)

## 2022-06-26 LAB — PROTIME-INR
INR: 1.1 (ref 0.8–1.2)
Prothrombin Time: 14.2 seconds (ref 11.4–15.2)

## 2022-06-26 LAB — PROCALCITONIN: Procalcitonin: 0.23 ng/mL

## 2022-06-26 LAB — CORTISOL-AM, BLOOD: Cortisol - AM: 5.7 ug/dL — ABNORMAL LOW (ref 6.7–22.6)

## 2022-06-26 MED ORDER — ETHAMBUTOL HCL 400 MG PO TABS
1200.0000 mg | ORAL_TABLET | Freq: Every day | ORAL | Status: DC
  Administered 2022-06-26 – 2022-06-29 (×4): 1200 mg via ORAL
  Filled 2022-06-26 (×4): qty 3

## 2022-06-26 MED ORDER — DOLUTEGRAVIR SODIUM 50 MG PO TABS
50.0000 mg | ORAL_TABLET | Freq: Every day | ORAL | Status: DC
  Administered 2022-06-26 – 2022-06-29 (×4): 50 mg via ORAL
  Filled 2022-06-26 (×4): qty 1

## 2022-06-26 MED ORDER — RIFABUTIN 150 MG PO CAPS
300.0000 mg | ORAL_CAPSULE | Freq: Every day | ORAL | Status: DC
  Administered 2022-06-26 – 2022-06-29 (×4): 300 mg via ORAL
  Filled 2022-06-26 (×5): qty 2

## 2022-06-26 MED ORDER — VANCOMYCIN HCL IN DEXTROSE 1-5 GM/200ML-% IV SOLN
1000.0000 mg | Freq: Three times a day (TID) | INTRAVENOUS | Status: DC
  Administered 2022-06-26: 1000 mg via INTRAVENOUS
  Filled 2022-06-26 (×2): qty 200

## 2022-06-26 MED ORDER — EMTRICITABINE-TENOFOVIR DF 200-300 MG PO TABS
1.0000 | ORAL_TABLET | Freq: Every day | ORAL | Status: DC
  Administered 2022-06-26 – 2022-06-29 (×4): 1 via ORAL
  Filled 2022-06-26 (×4): qty 1

## 2022-06-26 MED ORDER — VANCOMYCIN HCL 750 MG/150ML IV SOLN
750.0000 mg | Freq: Three times a day (TID) | INTRAVENOUS | Status: DC
  Administered 2022-06-26: 750 mg via INTRAVENOUS
  Filled 2022-06-26 (×2): qty 150

## 2022-06-26 MED ORDER — POTASSIUM CHLORIDE CRYS ER 20 MEQ PO TBCR
40.0000 meq | EXTENDED_RELEASE_TABLET | Freq: Once | ORAL | Status: AC
  Administered 2022-06-26: 40 meq via ORAL
  Filled 2022-06-26: qty 2

## 2022-06-26 MED ORDER — VANCOMYCIN HCL 750 MG/150ML IV SOLN
750.0000 mg | Freq: Three times a day (TID) | INTRAVENOUS | Status: DC
  Filled 2022-06-26: qty 150

## 2022-06-26 MED ORDER — EMTRICITABINE-TENOFOVIR AF 200-25 MG PO TABS
1.0000 | ORAL_TABLET | Freq: Every day | ORAL | Status: DC
  Filled 2022-06-26: qty 1

## 2022-06-26 MED ORDER — BENZTROPINE MESYLATE 1 MG PO TABS
1.0000 mg | ORAL_TABLET | Freq: Every day | ORAL | Status: DC
  Administered 2022-06-26 – 2022-06-29 (×4): 1 mg via ORAL
  Filled 2022-06-26 (×4): qty 1

## 2022-06-26 NOTE — Consult Note (Signed)
Michael Chandler, Michael Chandler TO:[id number] [birth year] Michael Nearing, MD  Reason for Consult: chronic lymphadenitis Requesting Physician: Karmen Bongo, MD Consulting Physician: Michael Nearing, MD  HPI: This 37 y.o. year old male was admitted on 06/25/2022 for Cellulitis of neck [L03.221] Severe sepsis ([place]) [A41.9, R65.20] Sepsis, due to unspecified organism, unspecified whether acute organ dysfunction present ([place]) [A41.9]. 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 He was in the hospital in December and left AMA.  Diagnosis of MAI cervical lymphadenitis was made in November when he was in the hospital and interventional radiology aspirated some of the nodes.  He was brought to the ED due to insomnia and anxiety and racing thoughts.  Patient reports that he ran out of his schizophrenia medications.  Reviewing electronic medical record from recent hospitalization in [place], this includes Haldol, trazodone, Cogentin, gabapentin.  He is a poor historian but he says he has been on some antibiotics recently. Reviewed outside records from his recent infectious disease clinic visit with Dr. Steva Ready.  He denies any difficulty swallowing or breathing but has had some drainage recently from the left neck. Medications:  Current Facility-Administered Medications  Medication Dose Route Frequency Provider Last Rate Last Admin   azithromycin (ZITHROMAX) tablet 500 mg  500 mg Oral Daily Judd Gaudier V, MD       cefTRIAXone (ROCEPHIN) 2 g in sodium chloride 0.9 % 100 mL IVPB  2 g Intravenous Q24H Judd Gaudier V, MD 200 mL/hr at 06/26/22 0419 2 g at 06/26/22 0419   haloperidol (HALDOL) tablet 5 mg  5 mg Oral BID Athena Masse, MD   5 mg at 06/26/22 0038   lactated  ringers infusion  150 mL/hr Intravenous Continuous Judd Gaudier V, MD 150 mL/hr at 06/26/22 0028 150 mL/hr at 06/26/22 0028   morphine (PF) 2 MG/ML injection 2 mg  2 mg Intravenous Q2H PRN Athena Masse, MD       ondansetron [facility]) tablet 4 mg  4 mg Oral Q6H PRN Athena Masse, MD       Or   ondansetron [facility]) injection 4 mg  4 mg Intravenous Q6H PRN Athena Masse, MD       traZODone (DESYREL) tablet 150 mg  150 mg Oral QHS Judd Gaudier V, MD   150 mg at 06/26/22 0038   vancomycin (VANCOREADY) IVPB 750 mg/150 mL  750 mg Intravenous Q8H Judd Gaudier V, MD 150 mL/hr at 06/26/22 0518 750 mg at 06/26/22 0518  .  Medications Prior to Admission  Medication Sig Dispense Refill   azithromycin (ZITHROMAX) 500 MG tablet Take 500 mg by mouth daily.     benztropine (COGENTIN) 1 MG tablet Take 1 tablet (1 mg total) by mouth daily. 30 tablet 0   dolutegravir (TIVICAY) 50 MG tablet Take 1 tablet (50 mg total) by mouth daily. 30 tablet 0   folic acid (FOLVITE) 1 MG tablet Take 1 tablet (1 mg total) by mouth daily. (Patient not taking: Reported on 04/20/2022) 30 tablet 1   gabapentin (NEURONTIN) 300 MG capsule Take 1 capsule (300 mg total) by mouth 3 (three) times daily. 90 capsule 0   haloperidol (HALDOL) 5 MG tablet Take 1 tablet (5 mg total) by mouth 2 (two) times daily. 60 tablet 0   mirtazapine (  REMERON SOL-TAB) 15 MG disintegrating tablet Take 1 tablet (15 mg total) by mouth at bedtime. (Patient not taking: Reported on 05/10/2022) 7 tablet 0   thiamine (VITAMIN B-1) 100 MG tablet Take 1 tablet (100 mg total) by mouth daily. (Patient not taking: Reported on 04/20/2022) 30 tablet 1   traZODone (DESYREL) 150 MG tablet Take 1 tablet (150 mg total) by mouth at bedtime. 30 tablet 0    Allergies:  Allergies  Allergen Reactions   Bactrim [Sulfamethoxazole-Trimethoprim] Anaphylaxis    PMH:  Past Medical History:  Diagnosis Date   HIV (human immunodeficiency virus infection) ([place])     Schizophrenia ([place])    per IVC paperwork, pt states does not have this dx    Fam Hx: History reviewed. No pertinent family history.  Soc Hx:  Social History   Socioeconomic History   Marital status: Single    Spouse name: Not on file   Number of children: Not on file   Years of education: Not on file   Highest education level: Not on file  Occupational History   Not on file  Tobacco Use   Smoking status: Former    Types: Cigarettes   Smokeless tobacco: Never   Tobacco comments:    Patient denies using tobacco products (03/13/22)  Substance and Sexual Activity   Alcohol use: Yes    Comment: occasional   Drug use: Yes    Types: Methamphetamines, Marijuana, Cocaine    Comment: states occasional marijuana and meth use   Sexual activity: Not Currently    Comment: condoms accepted  Other Topics Concern   Not on file  Social History Narrative   Not on file   Social Determinants of Health   Financial Resource Strain: Not on file  Food Insecurity: No Food Insecurity (06/25/2022)   Hunger Vital Sign    Worried About Running Out of Food in the Last Year: Never true    Ran Out of Food in the Last Year: Never true  Transportation Needs: No Transportation Needs (06/25/2022)   PRAPARE - Hydrologist (Medical): No    Lack of Transportation (Non-Medical): No  Physical Activity: Not on file  Stress: Not on file  Social Connections: Not on file  Intimate Partner Violence: Not At Risk (06/25/2022)   Humiliation, Afraid, Rape, and Kick questionnaire    Fear of Current or Ex-Partner: No    Emotionally Abused: No    Physically Abused: No    Sexually Abused: No    PSH:  Past Surgical History:  Procedure Laterality Date   IR US GUIDE BX ASP/DRAIN  03/21/2022  . Procedures since admission: No admission procedures for hospital encounter.  ROS: Review of systems normal other than 12 systems except per HPI.  PHYSICAL EXAM  Vitals: Blood pressure 105/62,  pulse 64, temperature 97.8 F (36.6 C), temperature source Oral, resp. rate 18, height '6\' 5"'$  (1.956 m), weight 93.5 kg, SpO2 100 %.. General: Well-developed, Well-nourished in no acute distress.  He was resting quietly when I entered the room, no stridor or labored breathing. Mood: Mood and affect cooperative but slightly adversarial during examination of the neck  Vocal Quality: No hoarseness. Communicates verbally. head and Face: NCAT. No facial asymmetry. No visible skin lesions. No significant facial scars. No tenderness with sinus percussion. Facial strength normal and symmetric. Ears: External ears with normal landmarks, no lesions. External auditory canals free of infection, cerumen noted on the left. Tympanic membrane on the right is intact  with good landmarks . No middle ear effusion. Hearing: Speech reception grossly normal. Nose: External nose normal with midline dorsum and no lesions or deformity. Nasal Cavity reveals essentially midline septum with normal inferior turbinates. No significant mucosal congestion or erythema. Nasal secretions are minimal and clear. No polyps seen on anterior rhinoscopy. Oral Cavity/ Oropharynx: Lips are normal with no lesions. Teeth no frank dental caries. Gingiva healthy with no lesions or gingivitis. Oropharynx including tongue, buccal mucosa, floor of mouth, hard and soft palate, uvula and posterior pharynx free of exudates, erythema or lesions with normal symmetry and hydration.  Indirect Laryngoscopy/Nasopharyngoscopy: Visualization of the larynx, hypopharynx and nasopharynx is not possible in this setting with routine examination. Neck: Left neck is tender with a draining fistula site on the anterior aspect of the sternocleidomastoid. The trachea is midline. Thyroid gland is soft, nontender and symmetric with no masses or enlargement. Parotid and submandibular glands are soft, nontender and symmetric, without masses. Lymphatic: Cervical lymph nodes  diffusely enlarged and tender on the left but soft with no crepitance. Respiratory: Normal respiratory effort without labored breathing. Cardiovascular: Carotid pulse shows regular rate and rhythm  MEDICAL DECISION MAKING: Data Review:  Results for orders placed or performed during the hospital encounter of 06/25/22 (from the past 48 hour(s))  Comprehensive metabolic panel     Status: Abnormal   Collection Time: 06/25/22  7:58 PM  Result Value Ref Range   Sodium 135 135 - 145 mmol/L   Potassium 3.2 (L) 3.5 - 5.1 mmol/L   Chloride 104 98 - 111 mmol/L   CO2 19 (L) 22 - 32 mmol/L   Glucose, Bld 94 70 - 99 mg/dL    Comment: Glucose reference range applies only to samples taken after fasting for at least 8 hours.   BUN 13 6 - 20 mg/dL   Creatinine, Ser 1.73 (H) 0.61 - 1.24 mg/dL   Calcium 8.5 (L) 8.9 - 10.3 mg/dL   Total Protein 8.6 (H) 6.5 - 8.1 g/dL   Albumin 3.2 (L) 3.5 - 5.0 g/dL   AST 34 15 - 41 U/L   ALT 22 0 - 44 U/L   Alkaline Phosphatase 97 38 - 126 U/L   Total Bilirubin 0.9 0.3 - 1.2 mg/dL   GFR, Estimated 52 (L) >60 mL/min    Comment: (NOTE) Calculated using the CKD-EPI Creatinine Equation (2021)    Anion gap 12 5 - 15    Comment: Performed at [facility], [address] [place], [place] [address]  Lactic acid, plasma     Status: None   Collection Time: 06/25/22  7:58 PM  Result Value Ref Range   Lactic Acid, Venous 1.2 0.5 - 1.9 mmol/L    Comment: Performed at [facility], [address] [place], [place] [address]  CBC with Differential     Status: Abnormal   Collection Time: 06/25/22  7:58 PM  Result Value Ref Range   WBC 13.7 (H) 4.0 - 10.5 K/uL   RBC 3.85 (L) 4.22 - 5.81 MIL/uL   Hemoglobin 9.7 (L) 13.0 - 17.0 g/dL   HCT 31.9 (L) 39.0 - 52.0 %   MCV 82.9 80.0 - 100.0 fL   MCH 25.2 (L) 26.0 - 34.0 pg   MCHC 30.4 30.0 - 36.0 g/dL   RDW 18.0 (H) 11.5 - 15.5 %   Platelets 263 150 - 400 K/uL   nRBC 0.0 0.0 - 0.2 %   Neutrophils Relative %  76 %   Neutro Abs  10.4 (H) 1.7 - 7.7 K/uL   Lymphocytes Relative 12 %   Lymphs Abs 1.7 0.7 - 4.0 K/uL   Monocytes Relative 11 %   Monocytes Absolute 1.5 (H) 0.1 - 1.0 K/uL   Eosinophils Relative 0 %   Eosinophils Absolute 0.0 0.0 - 0.5 K/uL   Basophils Relative 0 %   Basophils Absolute 0.0 0.0 - 0.1 K/uL   Immature Granulocytes 1 %   Abs Immature Granulocytes 0.18 (H) 0.00 - 0.07 K/uL    Comment: Performed at [facility], [address], [place], [place] [address]  Culture, blood (Routine x 2)     Status: None (Preliminary result)   Collection Time: 06/25/22  8:16 PM   Specimen: BLOOD LEFT ARM  Result Value Ref Range   Specimen Description BLOOD LEFT ARM    Special Requests      BOTTLES DRAWN AEROBIC AND ANAEROBIC Blood Culture results may not be optimal due to an inadequate volume of blood received in culture bottles   Culture      NO GROWTH < 12 HOURS Performed at [facility], [address], [place], [place] [address]    Report Status PENDING   Culture, blood (Routine x 2)     Status: None (Preliminary result)   Collection Time: 06/25/22  8:25 PM   Specimen: Right Antecubital; Blood  Result Value Ref Range   Specimen Description RIGHT ANTECUBITAL    Special Requests      BOTTLES DRAWN AEROBIC AND ANAEROBIC Blood Culture adequate volume   Culture      NO GROWTH < 12 HOURS Performed at [facility], [address] [place], [place] [address]    Report Status PENDING   Protime-INR     Status: None   Collection Time: 06/25/22  8:27 PM  Result Value Ref Range   Prothrombin Time 14.0 11.4 - 15.2 seconds   INR 1.1 0.8 - 1.2    Comment: (NOTE) INR goal varies based on device and disease states. Performed at [facility], [address] [place], [place] [address]   Resp panel by RT-PCR (RSV, Flu A&B, Covid) Anterior Nasal Swab     Status: None   Collection Time: 06/25/22  8:32 PM   Specimen: Anterior Nasal Swab  Result Value Ref  Range   SARS Coronavirus 2 by RT PCR NEGATIVE NEGATIVE    Comment: (NOTE) SARS-CoV-2 target nucleic acids are NOT DETECTED.  The SARS-CoV-2 RNA is generally detectable in upper respiratory specimens during the acute phase of infection. The lowest concentration of SARS-CoV-2 viral copies this assay can detect is 138 copies/mL. A negative result does not preclude SARS-Cov-2 infection and should not be used as the sole basis for treatment or other patient management decisions. A negative result may occur with  improper specimen collection/handling, submission of specimen other than nasopharyngeal swab, presence of viral mutation(s) within the areas targeted by this assay, and inadequate number of viral copies(<138 copies/mL). A negative result must be combined with clinical observations, patient history, and epidemiological information. The expected result is Negative.  Fact Sheet for Patients:  EntrepreneurPulse.com.au  Fact Sheet for Healthcare Providers:  IncredibleEmployment.be  This test is no t yet approved or cleared by the [place] FDA and  has been authorized for detection and/or diagnosis of SARS-CoV-2 by FDA under an Emergency Use Authorization (EUA). This EUA will remain  in effect (meaning this test can be used) for the duration of the COVID-19 declaration under Section 564(b)(1)  of the Act, 21 U.S.C.section 360bbb-3(b)(1), unless the authorization is terminated  or revoked sooner.       Influenza A by PCR NEGATIVE NEGATIVE   Influenza B by PCR NEGATIVE NEGATIVE    Comment: (NOTE) The Xpert Xpress SARS-CoV-2/FLU/RSV plus assay is intended as an aid in the diagnosis of influenza from Nasopharyngeal swab specimens and should not be used as a sole basis for treatment. Nasal washings and aspirates are unacceptable for Xpert Xpress SARS-CoV-2/FLU/RSV testing.  Fact Sheet for  Patients: EntrepreneurPulse.com.au  Fact Sheet for Healthcare Providers: IncredibleEmployment.be  This test is not yet approved or cleared by the [place] FDA and has been authorized for detection and/or diagnosis of SARS-CoV-2 by FDA under an Emergency Use Authorization (EUA). This EUA will remain in effect (meaning this test can be used) for the duration of the COVID-19 declaration under Section 564(b)(1) of the Act, 21 U.S.C. section 360bbb-3(b)(1), unless the authorization is terminated or revoked.     Resp Syncytial Virus by PCR NEGATIVE NEGATIVE    Comment: (NOTE) Fact Sheet for Patients: EntrepreneurPulse.com.au  Fact Sheet for Healthcare Providers: IncredibleEmployment.be  This test is not yet approved or cleared by the [place] FDA and has been authorized for detection and/or diagnosis of SARS-CoV-2 by FDA under an Emergency Use Authorization (EUA). This EUA will remain in effect (meaning this test can be used) for the duration of the COVID-19 declaration under Section 564(b)(1) of the Act, 21 U.S.C. section 360bbb-3(b)(1), unless the authorization is terminated or revoked.  Performed at [facility], [address] [place], [place] [address]   Lactic acid, plasma     Status: None   Collection Time: 06/25/22 11:08 PM  Result Value Ref Range   Lactic Acid, Venous 0.7 0.5 - 1.9 mmol/L    Comment: Performed at [facility], [address] [place], [place] [address]  Protime-INR     Status: None   Collection Time: 06/26/22  3:26 AM  Result Value Ref Range   Prothrombin Time 14.2 11.4 - 15.2 seconds   INR 1.1 0.8 - 1.2    Comment: (NOTE) INR goal varies based on device and disease states. Performed at [facility], [address] [place], [place] [address]   Procalcitonin     Status: None   Collection Time: 06/26/22  3:26 AM  Result Value Ref Range    Procalcitonin 0.23 ng/mL    Comment:        Interpretation: PCT (Procalcitonin) <= 0.5 ng/mL: Systemic infection (sepsis) is not likely. Local bacterial infection is possible. (NOTE)       Sepsis PCT Algorithm           Lower Respiratory Tract                                      Infection PCT Algorithm    ----------------------------     ----------------------------         PCT < 0.25 ng/mL                PCT < 0.10 ng/mL          Strongly encourage             Strongly discourage   discontinuation of antibiotics    initiation of antibiotics    ----------------------------     -----------------------------       PCT 0.25 - 0.50 ng/mL  PCT 0.10 - 0.25 ng/mL               OR       >80% decrease in PCT            Discourage initiation of                                            antibiotics      Encourage discontinuation           of antibiotics    ----------------------------     -----------------------------         PCT >= 0.50 ng/mL              PCT 0.26 - 0.50 ng/mL               AND        <80% decrease in PCT             Encourage initiation of                                             antibiotics       Encourage continuation           of antibiotics    ----------------------------     -----------------------------        PCT >= 0.50 ng/mL                  PCT > 0.50 ng/mL               AND         increase in PCT                  Strongly encourage                                      initiation of antibiotics    Strongly encourage escalation           of antibiotics                                     -----------------------------                                           PCT <= 0.25 ng/mL                                                 OR                                        > 80% decrease in PCT  Discontinue / Do not initiate                                             antibiotics  Performed at [facility], [address] [place], [place] [id number]   Basic metabolic panel     Status: Abnormal   Collection Time: 06/26/22  3:26 AM  Result Value Ref Range   Sodium 136 135 - 145 mmol/L   Potassium 3.4 (L) 3.5 - 5.1 mmol/L   Chloride 107 98 - 111 mmol/L   CO2 23 22 - 32 mmol/L   Glucose, Bld 86 70 - 99 mg/dL    Comment: Glucose reference range applies only to samples taken after fasting for at least 8 hours.   BUN 12 6 - 20 mg/dL   Creatinine, Ser 1.34 (H) 0.61 - 1.24 mg/dL   Calcium 8.3 (L) 8.9 - 10.3 mg/dL   GFR, Estimated >60 >60 mL/min    Comment: (NOTE) Calculated using the CKD-EPI Creatinine Equation (2021)    Anion gap 6 5 - 15    Comment: Performed at [facility], [address] [place], [place] [address]  CBC     Status: Abnormal   Collection Time: 06/26/22  3:26 AM  Result Value Ref Range   WBC 8.2 4.0 - 10.5 K/uL   RBC 3.61 (L) 4.22 - 5.81 MIL/uL   Hemoglobin 9.0 (L) 13.0 - 17.0 g/dL   HCT 28.5 (L) 39.0 - 52.0 %   MCV 78.9 (L) 80.0 - 100.0 fL   MCH 24.9 (L) 26.0 - 34.0 pg   MCHC 31.6 30.0 - 36.0 g/dL   RDW 17.7 (H) 11.5 - 15.5 %   Platelets 273 150 - 400 K/uL   nRBC 0.0 0.0 - 0.2 %    Comment: Performed at [facility], [address] [place], [place] [address]  . CT Soft Tissue Neck W Contrast  Result Date: 06/25/2022 CLINICAL DATA:  Initial evaluation for soft tissue infection. EXAM: CT NECK WITH CONTRAST TECHNIQUE: Multidetector CT imaging of the neck was performed using the standard protocol following the bolus administration of intravenous contrast. RADIATION DOSE REDUCTION: This exam was performed according to the departmental dose-optimization program which includes automated exposure control, adjustment of the mA and/or kV according to patient size and/or use of iterative reconstruction technique. CONTRAST:  43m OMNIPAQUE IOHEXOL 300 MG/ML  SOLN COMPARISON:  Prior study from 03/15/2022. FINDINGS: Pharynx and larynx: Oral cavity within  normal limits. Oropharynx and nasopharynx within normal limits. No retropharyngeal collection. Epiglottis within normal limits. Hypopharynx and supraglottic larynx within normal limits. Negative glottis. Subglottic airway patent clear. Salivary glands: Parotid glands and right submandibular gland are normal. Inflammatory changes partially surround the left submandibular gland related to the acute inflammatory process within the left neck. Gland itself felt to be within normal limits. Thyroid: Normal. Lymph nodes: Multiple enlarged lymph nodes and/or nodal conglomerate seen extending along the left cervical chain. For reference purposes, the largest node measures up to approximately 2.8 cm in short axis at level 2 (series 2, image 49). Few scattered areas of internal hypodensity consistent with internal suppuration and/or necrosis. Extensive overlying soft tissue swelling with inflammatory stranding within the adjacent anterolateral left neck. The left sternocleidomastoid muscle is thickened and edematous. Superimposed hypodense collection at the posterior margin of the left SCM at the left lateral neck  measures 1.9 x 1.4 x 2.0 cm, consistent with abscess (series 2, image 61). Additional collection located superiorly within the left posterior paraspinous musculature measures 2.0 x 1.3 x 3.3 cm (series 2, image 42). An additional probable collection positioned along the deep aspect of the left sternocleidomastoid muscle measures approximately 2.5 x 1.5 x 1.0 cm (series 2, image 65). Few additional scattered subcentimeter hypodensities noted within this region, likely small micro abscesses. No pathologic adenopathy seen within the contralateral right neck. Vascular: Normal intravascular enhancement seen throughout the neck. Left IJ is partially compressed by the left-sided adenopathy. Limited intracranial: Unremarkable. Visualized orbits: Unremarkable. Mastoids and visualized paranasal sinuses: Mild mucosal thickening  present about the left maxillary sinus. Visualized paranasal sinuses are otherwise largely clear. Trace left mastoid effusion noted. Middle ear cavities are clear. Skeleton: No discrete or worrisome osseous lesions. Prior fusion at C4-C7 with C5 and C6 corpectomy. Prior posterior decompression with fusion at C3 through T1. No visible hardware complication. Upper chest: Visualized upper chest demonstrates no acute finding. Small bulla noted at the medial left upper lobe. Other: None. IMPRESSION: 1. Extensive soft tissue swelling with inflammatory stranding throughout the anterolateral left neck, concerning for acute soft tissue infection/cellulitis. Multiple superimposed hypodense collections within this region measure up to 3.3 cm, concerning for abscesses. 2. Superimposed extensive left-sided cervical adenopathy. Internal hypodensity within a few of these nodes consistent with suppuration and/or necrosis. While these findings are favored to reflect changes of infection, clinical follow-up to resolution recommended and to ensure no underlying neoplastic process is present. Correlation with dedicated nodal histologic sampling could be performed for further evaluation as warranted. 3. Prior fusion at C4-C7 with C5 and C6 corpectomy. Prior posterior decompression with fusion at C3 through T1. No visible hardware complication. Electronically Signed   By: Jeannine Boga M.D.   On: 06/25/2022 21:28   DG Chest Port 1 View  Result Date: 06/25/2022 CLINICAL DATA:  Suspected sepsis EXAM: PORTABLE CHEST 1 VIEW COMPARISON:  04/03/2022 FINDINGS: Cardiac shadow is stable. The lungs are well aerated bilaterally without focal infiltrate or effusion. No acute bony abnormality is noted. IMPRESSION: No active disease. Electronically Signed   By: Inez Catalina M.D.   On: 06/25/2022 20:30  .   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  This has been going on for months.  These are chronic infections and the nodes can drain intermittently.  He is followed by infectious disease.  PLAN: Defer recommendations on antibiotic management to infectious disease, however typical management involves a combination of clarithromycin, rifampin and ethambutol.  Recommend consulting interventional radiology to aspirate accessible abscessed nodes.  Patient would require sedation for drainage, , and the deeper suppurative nodes would not be appropriate for bedside I&D anyway.  In addition I&D can result in chronic draining tracts so aspiration would be the best choice.  As discussed with the emergency room I think this patient would be best managed at a tertiary care center given the nature of this infection.  Management options typically include either antibiotic management, or antibiotic management in conjunction with complete excision of the involved lymph nodes.  Certainly in his case this would require an extensive neck dissection which we would not be able to offer locally.   Michael Nearing, MD 06/26/2022 8:17 AM

## 2022-06-26 NOTE — Consult Note (Signed)
Interventional radiology has reviewed the CT and appear to have downgraded their impression of the hypodensities seen in the nodes from abscesses up to 2 cm to now saying most of this is edema with small fluid collections that they do not feel IR drainage is required for. Would continue medical management with antibiotics per ID. No surgical intervention from our standpoint, however with persisting disease, would recommend tertiary care referral as radical neck dissection could be a consideration in extreme cases. This is not something we can offer locally. I did reach out to [facility] head and neck surgery and they are reviewing his CT and may confer with their ID people on this case. Will see what they say.

## 2022-06-26 NOTE — Hospital Course (Addendum)
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  He was lost to f/u and apparently noncompliant with medications.  He presented with police overnight for agitation and was found to have severe sepsis associated with cellulitis and abscess of the neck.  He was started on Cefepime and Flagyl with plan for ENT consultation, IR consultation for possible drainage, and need for ID consultation.  ENT has seen the patient and notes that these are chronic in nature with intermittent drainage.  He recommends IR and ID consultations with possible need for transfer to a tertiary care center should surgical intervention be needed since this would require an extensive neck dissection.  ID is consulting.  IR has looked at images and notes soft tissue swelling without a drainable abscess.

## 2022-06-26 NOTE — Progress Notes (Signed)
Imaging and case were reviewed with Dr Denna Haggard. Based on review of the patient's imaging, soft-tissue swelling of the neck seen on exam is largely soft-tissue edema. While there are several small fluid collections visualized, these are not amenable to IR image-guided drainage. IR will not be performing drainage at this point in time, recommend further management as recommended by ENT team. Please contact IR team with any questions or concerns.  Lura Em, PA-C 06/26/2022

## 2022-06-26 NOTE — Assessment & Plan Note (Signed)
-  Thought to be associated with cellulitis of the neck, with known h/o MAI -SIRS criteria in this patient on admission included: tachycardia, tachypnea  -Patient had evidence of acute organ failure with recurrent hypotension (SBP < 90 or MAP < 65 x 2 readings) that was not easily explained by another condition. -While awaiting blood cultures, this appears to be a preseptic condition. -Sepsis protocol initiated -Blood cultures pending but NTD -Admitted due to: hemodynamic instability; immunocompromised infection -Sepsis physiology appears to be resolved -Treat with IV Rocephin/Rifabutin for cellulitis per ID -negative lactate -Procalcitonin level mildly elevated -ID is consulting -IR does not believe there is a drainable collection at this time -ENT does not see an indication for intervention but if needed patient is likely to need a tertiary care facility

## 2022-06-26 NOTE — Progress Notes (Signed)
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  He was lost to f/u and apparently noncompliant with medications.  He presented with police overnight for agitation and was found to have severe sepsis associated with cellulitis and abscess of the neck.  He was started on Cefepime and Flagyl with plan for ENT consultation, IR consultation for possible drainage, and need for ID consultation.  ENT has seen the patient and notes that these are chronic in nature with intermittent drainage.  He recommends IR and ID consultations with possible need for transfer to a tertiary care center should surgical intervention be needed since this would require an extensive neck dissection.  ID is consulting.  IR has looked at images and notes soft tissue swelling without a drainable abscess.  Assessment and Plan: * Severe sepsis ([place]) -Thought to be associated with cellulitis of the neck -SIRS criteria in this patient includes: tachycardia, tachypnea  -Patient has evidence of acute organ failure with recurrent hypotension (SBP < 90 or MAP < 65 x 2 readings) that is not easily explained by another condition. -While awaiting blood cultures, this appears to be a preseptic condition. 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 -ENT consulted, does not recommend current intervention and reports need for tertiary center if needed -IR consulted for possible drainage but sees no discernable pocket for drainage -ID consulted  AKI (acute kidney injury) ([place]) -Patient without baseline compromised renal function  -Current creatinine is increased at least 1.5 times compared to baseline and presumed to have occurred within the last 7 days -Likely due to prerenal secondary to severe dehydration in the setting of infection -Creatinine has already improved since presentation and is expected to normalize -IVF -Follow up renal function by BMP -Avoid ACEI and NSAIDs   HIV/AIDS (human immunodeficiency virus infection) ([place]) -CD4 in 02/2022 was 181 (improved from 99 in 11/2021) -Viral load from 02/2022 was 3134 -Currently reported to be on Tivicay-Truvada but last filled for 30 days in December so unlikely  -MAC - on ethambutol, azithromycin, rifabutin (compliance uncertain - not on Med rec) -Also supposed to be taking atovaquone - (compliance uncertain - not on Med rec) -He was due to f/u with ID in 2 weeks (so mid-January) and does not appear to have followed up -ID is consulting now  Schizophrenia, undifferentiated ([place]) -Continue Haldol, Cogentin, and trazodone, though compliance uncertain  H/O [id number] discitis and MSSA epidural abscess s/p surgical decompression and fusion with hardware 2021 -No visible hardware complication on CT  Chronic anemia -Appears to be at/near baseline at this time -Recheck CBC in AM       Subjective: The patient was somnolent and disinterested at the time of my evaluation.  He had very limited verbalization.  However, shortly thereafter he did ask nursing staff for a diet order.   Physical  Exam: Vitals:   06/25/22 2215 06/25/22 2321 06/26/22 0437 06/26/22 0900  BP: 107/66 114/68 105/62 117/74  Pulse: 90 87 64 83  Resp: '18 18 18 20  '$ Temp:  98.6 F (37 C) 97.8 F (36.6 C) 98 F (36.7 C)  TempSrc:  Oral Oral Oral  SpO2: 100% 99% 100%   Weight: 93.5 kg     Height: '6\' 5"'$  (1.956 m)      General:  Appears somnolent, withdrawn Eyes:  normal lids, iris ENT:  grossly normal hearing, lips & tongue, mmm Neck:  left neck fluctuant mass with surrounding erythema   Cardiovascular:  RRR, no m/r/g. No LE edema.  Respiratory:   CTA bilaterally with no wheezes/rales/rhonchi.  Normal respiratory effort. Abdomen:  soft, NT, ND Skin:  no rash or induration seen on limited exam other than as above Musculoskeletal:  grossly normal tone BUE/BLE, good ROM, no bony abnormality Psychiatric:  flat/somnolent mood and affect, speech quite sparse Neurologic:  unable to perform due to unwillingness of patient to participate in exam   Radiological Exams on Admission: Independently reviewed - see discussion in A/P where applicable  CT Soft Tissue Neck W Contrast  Result Date: 06/25/2022 CLINICAL DATA:  Initial evaluation for soft tissue infection. EXAM: CT NECK WITH CONTRAST TECHNIQUE: Multidetector CT imaging of the neck was performed using the standard protocol following the bolus administration of intravenous contrast. RADIATION DOSE REDUCTION: This exam was performed according to the departmental dose-optimization program which includes automated exposure control, adjustment of the mA and/or kV according to patient size and/or use of iterative reconstruction technique. CONTRAST:  6m OMNIPAQUE IOHEXOL 300 MG/ML  SOLN COMPARISON:  Prior study from 03/15/2022. FINDINGS: Pharynx and larynx: Oral cavity within normal limits. Oropharynx and nasopharynx within normal limits. No retropharyngeal collection. Epiglottis within normal limits. Hypopharynx and supraglottic larynx within normal limits. Negative  glottis. Subglottic airway patent clear. Salivary glands: Parotid glands and right submandibular gland are normal. Inflammatory changes partially surround the left submandibular gland related to the acute inflammatory process within the left neck. Gland itself felt to be within normal limits. Thyroid: Normal. Lymph nodes: Multiple enlarged lymph nodes and/or nodal conglomerate seen extending along the left cervical chain. For reference purposes, the largest node measures up to approximately 2.8 cm in short axis at level 2 (series 2, image 49). Few scattered areas of internal hypodensity consistent with internal suppuration and/or necrosis. Extensive overlying soft tissue swelling with inflammatory stranding within the adjacent anterolateral left neck. The left sternocleidomastoid muscle is thickened and edematous. Superimposed hypodense collection at the posterior margin of the left SCM at the left lateral neck measures 1.9 x 1.4 x 2.0 cm, consistent with abscess (series 2, image 61). Additional collection located superiorly within the left posterior paraspinous musculature measures 2.0 x 1.3 x 3.3 cm (series 2, image 42). An additional probable collection positioned along the deep aspect of the left sternocleidomastoid muscle measures approximately 2.5 x 1.5 x 1.0 cm (series 2, image 65). Few additional scattered subcentimeter hypodensities noted within this region, likely small micro abscesses. No pathologic adenopathy seen within the contralateral right neck. Vascular: Normal intravascular enhancement seen throughout the neck. Left IJ is partially compressed by the left-sided adenopathy. Limited intracranial: Unremarkable. Visualized orbits: Unremarkable. Mastoids and visualized paranasal sinuses: Mild mucosal thickening present about the left maxillary sinus. Visualized paranasal sinuses are otherwise largely clear. Trace left mastoid effusion noted. Middle ear  cavities are clear. Skeleton: No discrete or  worrisome osseous lesions. Prior fusion at C4-C7 with C5 and C6 corpectomy. Prior posterior decompression with fusion at C3 through T1. No visible hardware complication. Upper chest: Visualized upper chest demonstrates no acute finding. Small bulla noted at the medial left upper lobe. Other: None. IMPRESSION: 1. Extensive soft tissue swelling with inflammatory stranding throughout the anterolateral left neck, concerning for acute soft tissue infection/cellulitis. Multiple superimposed hypodense collections within this region measure up to 3.3 cm, concerning for abscesses. 2. Superimposed extensive left-sided cervical adenopathy. Internal hypodensity within a few of these nodes consistent with suppuration and/or necrosis. While these findings are favored to reflect changes of infection, clinical follow-up to resolution recommended and to ensure no underlying neoplastic process is present. Correlation with dedicated nodal histologic sampling could be performed for further evaluation as warranted. 3. Prior fusion at C4-C7 with C5 and C6 corpectomy. Prior posterior decompression with fusion at C3 through T1. No visible hardware complication. Electronically Signed   By: Jeannine Boga M.D.   On: 06/25/2022 21:28   DG Chest Port 1 View  Result Date: 06/25/2022 CLINICAL DATA:  Suspected sepsis EXAM: PORTABLE CHEST 1 VIEW COMPARISON:  04/03/2022 FINDINGS: Cardiac shadow is stable. The lungs are well aerated bilaterally without focal infiltrate or effusion. No acute bony abnormality is noted. IMPRESSION: No active disease. Electronically Signed   By: Inez Catalina M.D.   On: 06/25/2022 20:30    EKG: not done today   Labs on Admission: I have personally reviewed the available labs and imaging studies at the time of the admission.  Pertinent labs:    K+ 3.4 BUN 12/Creatinine 1.34/GFR >60; 13/1.73/52 on presentation with normal baseline Lactate 0.7 Procalcitonin 0.23 WBC 8.2 Hgb 9.0 INR 1.1 COVID/flu/RSV  negative  Family Communication: None listed, patient would not provide contact information at the time of admission  Disposition: Status is: Inpatient Remains inpatient appropriate because: IV antibiotics, ID consulting   Planned Discharge Destination: Home  Time spent: 50 minutes  Author: Karmen Bongo, MD 06/26/2022 3:10 PM  For on call review [email or web address].

## 2022-06-26 NOTE — Progress Notes (Signed)
Pharmacy Antibiotic Note  Michael Chandler is a 37 y.o. male admitted on 06/25/2022 with sepsis/neck abscess.  Pharmacy has been consulted for Vancomycin dosing.  -Hx: AIDS, Mycobacterium avium infection, c5-c7 osteodiscitis and MSSA epidural abscess followed outpatient by ID.  Plan: Scr improved 1.73>>1.34  Will adjust Vancomycin from 750 mg to 1000 mg IV Q8H   Goal AUC 400-550. AUC = 525 Cmin  = 16.6 Scr 1.34  F/u Scr, cultures  Height: '6\' 5"'$  (195.6 cm) Weight: 93.5 kg (206 lb 2.1 oz) IBW/kg (Calculated) : 89.1  Temp (24hrs), Avg:98.4 F (36.9 C), Min:97.8 F (36.6 C), Max:98.9 F (37.2 C)  Recent Labs  Lab 06/25/22 1958 06/25/22 2308 06/26/22 0326  WBC 13.7*  --  8.2  CREATININE 1.73*  --  1.34*  LATICACIDVEN 1.2 0.7  --      Estimated Creatinine Clearance: 97 mL/min (A) (by C-G formula based on SCr of 1.34 mg/dL (H)).    Allergies  Allergen Reactions   Bactrim [Sulfamethoxazole-Trimethoprim] Anaphylaxis    Antimicrobials this admission:  Metronidazole/Cefepime x 1 each 3/3 Vancomycin 3/3 (evening)>> Ceftriaxone 3/4>> Azithromycin 3/4>>     (on PTA per Med Rec)  Dose adjustments this admission: 3/4: Vancomycin 750 mg to 1000 mg IV Q8H  Microbiology results:  3/3 BCx: NGTD  UCx:    Sputum:    MRSA PCR:   Thank you for allowing pharmacy to be a part of this patient's care.  Chinita Greenland PharmD Clinical Pharmacist 06/26/2022

## 2022-06-26 NOTE — Consult Note (Addendum)
NAME: Michael Chandler  DOB: October 22, 1985  MRN: [id number]  Date/Time: 06/26/2022 12:22 PM  REQUESTING PROVIDER: Dr.Yates Subjective:  REASON FOR CONSULT: AIDS/mycobacteria infection ? Michael Chandler is a 37 y.o. 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 But he last filled medications on 04/08/22 HE must be on HIV meds- truvada and Dolutegravir MAI meds ethambultol '1200mg'$ , rifabutin '300mg'$  and azithromycin '500mg'$  Po qd  Medical history Pt has had multiple hospitalizations in the past many months- behavioral health and medical admission In Oct 2023 he was admitted to [facility] 10/24-1-0/30 23 with vomiting, malaise, sob and was seen by ID physician- His note is  in Care everywhere.extensive workup for Ois During that hospitalization he was diagnosed with viral pneumonia due to Rhino/adeno virus, ( PJP was ruled out by sputum neg for DFA but he had positive B D glucan at 458 which was thought to be  due to candida esophagitis, LDH 493)  diarrhea due to cryptosporidium and EPEC.  As he had headache he underwent LP on 02/15/22 which was Normal  normal protein and glucose, negative cryptococcus and neg VDRL   Other labs Serum RPR 1:2 TPA positive Beta D glucan was positive at 468 HIV RNA 160,170 CD4 -48 ( 6%) HEPb surface antigen  positive Heb B Surface antibody was positive as well Core neg HEPB DNA 661 G6PD N Toxo IgG /IgM neg CT chest upper lobe GGO- emphysematous changes  He was started on biktavy, dapsone and fluconazole and discharged on the same. He was admitted to [facility] [place] in Nov 2023   He had ID out patient appt at  [facility] with Dr.Wallace on 03/13/22. During that visit as he was c/o neck pain the plan was to do MRI  He had also done some labs and as the WBC as high his team was trying to reach patient on 11/21 with no success.    He ended up in [facility] ED on 03/15/22 with fever, cough, left sided neck pain which has been present for more than a week CT neck showed Extensive lymphadenopathy throughout the left neck,   Lymphadenopathy effaces the caudal left internal jugular vein ID work up CMV DNA 617- low level- will not treat now- will repeat EBV dna positive but < 35 copies Quantiferon TB gold 03/18/23  -negative Sputum AFB 11/23 -MAI 03/18/22- sputum Afb -MAI 11/28 -sputum Afb - MAI 11/28 biopsy  of neck- MAI 03/18/22 Blood culture  Afb- negative Bartonella-Neg Toxo-Neg Fungal antibodies negative   Past Medical History:  Diagnosis Date   HIV (human immunodeficiency virus infection) ([place])    Schizophrenia ([place])    per IVC paperwork, pt states does not have this dx    Past Surgical History:  Procedure Laterality Date   IR US GUIDE BX ASP/DRAIN  03/21/2022    Social History   Socioeconomic History   Marital status: Single    Spouse name: Not on file   Number of children: Not on file   Years of education: Not on file   Highest education level: Not on file  Occupational History   Not on file  Tobacco Use   Smoking status: Former    Types: Cigarettes   Smokeless tobacco: Never   Tobacco comments:    Patient denies using tobacco products (03/13/22)  Substance and Sexual Activity   Alcohol use: Yes    Comment: occasional   Drug use: Yes    Types: Methamphetamines, Marijuana,  Cocaine    Comment: states occasional marijuana and meth use   Sexual activity: Not Currently    Comment: condoms accepted  Other Topics Concern   Not on file  Social History Narrative   Not on file   Social Determinants of Health   Financial Resource Strain: Not on file  Food Insecurity: No Food Insecurity (06/25/2022)   Hunger Vital Sign    Worried About Running Out of Food in the Last Year: Never true    Ran Out of Food in the Last Year: Never true  Transportation Needs: No Transportation Needs (06/25/2022)   PRAPARE - Hydrologist (Medical): No    Lack of Transportation (Non-Medical): No  Physical Activity: Not on file  Stress: Not on file  Social Connections: Not on file  Intimate Partner Violence: Not At Risk (06/25/2022)   Humiliation, Afraid, Rape, and Kick questionnaire    Fear of Current or Ex-Partner: No    Emotionally Abused: No    Physically Abused: No    Sexually Abused: No    History reviewed. No pertinent family history. Allergies  Allergen Reactions   Bactrim [Sulfamethoxazole-Trimethoprim] Anaphylaxis   I? Current Facility-Administered Medications  Medication Dose Route Frequency Provider Last Rate Last Admin   azithromycin (ZITHROMAX) tablet 500 mg  500 mg Oral Daily Judd Gaudier V, MD   500 mg at 06/26/22 1110   cefTRIAXone (ROCEPHIN) 2 g in sodium chloride 0.9 % 100 mL IVPB  2 g Intravenous Q24H Judd Gaudier V, MD 200 mL/hr at 06/26/22 0419 2 g at 06/26/22 0419   haloperidol (HALDOL) tablet 5 mg  5 mg Oral BID Athena Masse, MD   5 mg at 06/26/22 1110   lactated ringers infusion  150 mL/hr Intravenous Continuous Judd Gaudier V, MD 150 mL/hr at 06/26/22 0028 150 mL/hr at 06/26/22 0028   morphine (PF) 2 MG/ML injection 2 mg  2 mg Intravenous Q2H PRN Athena Masse, MD       ondansetron [facility]) tablet 4 mg  4 mg Oral Q6H PRN Athena Masse, MD       Or   ondansetron [facility]) injection 4 mg  4 mg Intravenous Q6H PRN Athena Masse, MD       traZODone (DESYREL) tablet 150 mg  150 mg Oral QHS Judd Gaudier V, MD   150 mg at 06/26/22 0038   vancomycin (VANCOCIN) IVPB 1000 mg/200 mL premix  1,000 mg Intravenous Q8H Merrill, Kristin A, RPH         Abtx:  Anti-infectives (From admission, onward)    Start     Dose/Rate Route Frequency Ordered Stop   06/26/22 1400  vancomycin (VANCOCIN) IVPB 1000 mg/200 mL premix        1,000 mg 200 mL/hr over 60 Minutes Intravenous Every 8 hours  06/26/22 0836 07/03/22 0559   06/26/22 1000  azithromycin (ZITHROMAX) tablet 500 mg        500 mg Oral Daily 06/25/22 2351     06/26/22 0600  vancomycin (VANCOREADY) IVPB 750 mg/150 mL  Status:  Discontinued        750 mg 150 mL/hr over 60 Minutes Intravenous Every 8 hours 06/26/22 0056 06/26/22 0104   06/26/22 0600  vancomycin (VANCOREADY) IVPB 750 mg/150 mL  Status:  Discontinued        750 mg 150 mL/hr over 60 Minutes Intravenous Every 8 hours 06/26/22 0104 06/26/22 0836   06/26/22 0400  cefTRIAXone (ROCEPHIN) 2 g in sodium chloride 0.9 % 100 mL IVPB        2 g 200 mL/hr over 30 Minutes Intravenous Every 24 hours 06/25/22 2351 07/03/22 0359   06/25/22 2045  vancomycin (VANCOREADY) IVPB 1750 mg/350 mL        1,750 mg 175 mL/hr over 120 Minutes Intravenous  Once 06/25/22 2032 06/26/22 0028   06/25/22 2030  ceFEPIme (MAXIPIME) 2 g in sodium chloride 0.9 % 100 mL IVPB        2 g 200 mL/hr over 30 Minutes Intravenous  Once 06/25/22 2020 06/25/22 2119   06/25/22 2030  metroNIDAZOLE (FLAGYL) IVPB 500 mg        500 mg 100 mL/hr over 60 Minutes Intravenous  Once 06/25/22 2020 06/25/22 2217   06/25/22 2030  vancomycin (VANCOCIN) IVPB 1000 mg/200 mL premix  Status:  Discontinued        1,000 mg 200 mL/hr over 60 Minutes Intravenous  Once 06/25/22 2020 06/25/22 2032       REVIEW OF SYSTEMS:  Const: negative fever, negative chills, negative weight loss Eyes: negative diplopia or visual changes, negative eye pain ENT: negative coryza,  negative sore throat Swelling left side of neck Resp: negative cough, hemoptysis, dyspnea Cards: negative for chest pain, palpitations, lower extremity edema GU: negative for frequency, dysuria and hematuria GI: Negative for abdominal pain, diarrhea, bleeding, constipation Skin: negative for rash and pruritus Heme: negative for easy bruising and gum/nose bleeding MS:  muscle weakness Neurolo:negative for headaches, dizziness, vertigo, memory problems  Psych: schizophrenia Endocrine: negative for thyroid, diabetes Allergy/Immunology- bactrim Objective:  VITALS:  BP 117/74 (BP Location: Left Arm)   Pulse 83   Temp 98 F (36.7 C) (Oral)   Resp 20   Ht '6\' 5"'$  (1.956 m)   Wt 93.5 kg   SpO2 100%   BMI 24.44 kg/m  PHYSICAL EXAM:  General: somnolent, responds to questions appropriately  Head: Normocephalic, without obvious abnormality, atraumatic. Eyes: Conjunctivae clear, anicteric sclerae. Pupils are equal ENT Nares normal. No drainage or sinus tenderness. Lips, mucosa, and tongue normal. No Thrush Neck: left side of neck soft swelling with some discharge . Back: No CVA tenderness. Lungs: Clear to auscultation bilaterally. No Wheezing or Rhonchi. No rales. Heart: Regular rate and rhythm, no murmur, rub or gallop. Abdomen: Soft, non-tender,not distended. Bowel sounds normal. No masses Extremities: atraumatic, no cyanosis. No edema. No clubbing Skin: No rashes or lesions. Or bruising Lymph: left cervical node enlargement. Neurologic: Grossly non-focal Pertinent Labs Lab Results CBC    Component Value Date/Time   WBC 8.2 06/26/2022 0326   RBC 3.61 (L) 06/26/2022 0326   HGB 9.0 (L) 06/26/2022 0326   HGB 7.9 (L) 03/18/2022 0648   HCT 28.5 (L) 06/26/2022 0326   HCT 24.5 (L) 03/18/2022 0648   PLT 273 06/26/2022 0326   PLT 466 (  H) 03/18/2022 0648   MCV 78.9 (L) 06/26/2022 0326   MCV 80 03/18/2022 0648   MCH 24.9 (L) 06/26/2022 0326   MCHC 31.6 06/26/2022 0326   RDW 17.7 (H)  06/26/2022 0326   RDW 16.1 (H) 03/18/2022 0648   LYMPHSABS 1.7 06/25/2022 1958   LYMPHSABS 1.3 03/18/2022 0648   MONOABS 1.5 (H) 06/25/2022 1958   EOSABS 0.0 06/25/2022 1958   EOSABS 0.2 03/18/2022 0648   BASOSABS 0.0 06/25/2022 1958   BASOSABS 0.0 03/18/2022 0648       Latest Ref Rng & Units 06/26/2022    3:26 AM 06/25/2022    7:58 PM 05/21/2022    6:19 PM  CMP  Glucose 70 - 99 mg/dL 86  94  101   BUN 6 - 20 mg/dL '12  13  11   '$ Creatinine 0.61 - 1.24 mg/dL 1.34  1.73  0.86   Sodium 135 - 145 mmol/L 136  135  135   Potassium 3.5 - 5.1 mmol/L 3.4  3.2  3.9   Chloride 98 - 111 mmol/L 107  104  100   CO2 22 - 32 mmol/L '23  19  25   '$ Calcium 8.9 - 10.3 mg/dL 8.3  8.5  8.5   Total Protein 6.5 - 8.1 g/dL  8.6  8.2   Total Bilirubin 0.3 - 1.2 mg/dL  0.9  0.5   Alkaline Phos 38 - 126 U/L  97  128   AST 15 - 41 U/L  34  23   ALT 0 - 44 U/L  22  19       Microbiology: Recent Results (from the past 240 hour(s))  Culture, blood (Routine x 2)     Status: None (Preliminary result)   Collection Time: 06/25/22  8:16 PM   Specimen: BLOOD LEFT ARM  Result Value Ref Range Status   Specimen Description BLOOD LEFT ARM  Final   Special Requests   Final    BOTTLES DRAWN AEROBIC AND ANAEROBIC Blood Culture results may not be optimal due to an inadequate volume of blood received in culture bottles   Culture   Final    NO GROWTH < 12 HOURS Performed at [facility], [address] [place], [place] [address]    Report Status PENDING  Incomplete  Culture, blood (Routine x 2)     Status: None (Preliminary result)   Collection Time: 06/25/22  8:25 PM   Specimen: Right Antecubital; Blood  Result Value Ref Range Status   Specimen Description RIGHT ANTECUBITAL  Final   Special Requests   Final    BOTTLES DRAWN AEROBIC AND ANAEROBIC Blood Culture adequate volume   Culture   Final    NO GROWTH < 12 HOURS Performed at [facility], [address] [place], [place] [address]     Report Status PENDING  Incomplete  Resp panel by RT-PCR (RSV, Flu A&B, Covid) Anterior Nasal Swab     Status: None   Collection Time: 06/25/22  8:32 PM   Specimen: Anterior Nasal Swab  Result Value Ref Range Status   SARS Coronavirus 2 by RT PCR NEGATIVE NEGATIVE Final    Comment: (NOTE) SARS-CoV-2 target nucleic acids are NOT DETECTED.  The SARS-CoV-2 RNA is generally detectable in upper respiratory specimens during the acute phase of infection. The lowest concentration of SARS-CoV-2 viral copies this assay can detect is 138 copies/mL. A negative result does not preclude SARS-Cov-2 infection and should not be used as the sole basis  for treatment or other patient management decisions. A negative result may occur with  improper specimen collection/handling, submission of specimen other than nasopharyngeal swab, presence of viral mutation(s) within the areas targeted by this assay, and inadequate number of viral copies(<138 copies/mL). A negative result must be combined with clinical observations, patient history, and epidemiological information. The expected result is Negative.  Fact Sheet for Patients:  EntrepreneurPulse.com.au  Fact Sheet for Healthcare Providers:  IncredibleEmployment.be  This test is no t yet approved or cleared by the [place] FDA and  has been authorized for detection and/or diagnosis of SARS-CoV-2 by FDA under an Emergency Use Authorization (EUA). This EUA will remain  in effect (meaning this test can be used) for the duration of the COVID-19 declaration under Section 564(b)(1) of the Act, 21 U.S.C.section 360bbb-3(b)(1), unless the authorization is terminated  or revoked sooner.       Influenza A by PCR NEGATIVE NEGATIVE Final   Influenza B by PCR NEGATIVE NEGATIVE Final    Comment: (NOTE) The Xpert Xpress SARS-CoV-2/FLU/RSV plus assay is intended as an aid in the diagnosis of influenza from Nasopharyngeal swab  specimens and should not be used as a sole basis for treatment. Nasal washings and aspirates are unacceptable for Xpert Xpress SARS-CoV-2/FLU/RSV testing.  Fact Sheet for Patients: EntrepreneurPulse.com.au  Fact Sheet for Healthcare Providers: IncredibleEmployment.be  This test is not yet approved or cleared by the [place] FDA and has been authorized for detection and/or diagnosis of SARS-CoV-2 by FDA under an Emergency Use Authorization (EUA). This EUA will remain in effect (meaning this test can be used) for the duration of the COVID-19 declaration under Section 564(b)(1) of the Act, 21 U.S.C. section 360bbb-3(b)(1), unless the authorization is terminated or revoked.     Resp Syncytial Virus by PCR NEGATIVE NEGATIVE Final    Comment: (NOTE) Fact Sheet for Patients: EntrepreneurPulse.com.au  Fact Sheet for Healthcare Providers: IncredibleEmployment.be  This test is not yet approved or cleared by the [place] FDA and has been authorized for detection and/or diagnosis of SARS-CoV-2 by FDA under an Emergency Use Authorization (EUA). This EUA will remain in effect (meaning this test can be used) for the duration of the COVID-19 declaration under Section 564(b)(1) of the Act, 21 U.S.C. section 360bbb-3(b)(1), unless the authorization is terminated or revoked.  Performed at [facility], [address] [place], [place] [address]     IMAGING RESULTS:  I have personally reviewed the films Left cervical LN nodes enalrgement ? 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 Blood culture in Nov  was negative for AFB Pt last filled prescriptions on 12/16. 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 Anemia?  AKI  Recommend psych consult ___________________________________________________ Discussed with patient, requesting provider

## 2022-06-26 NOTE — Progress Notes (Signed)
Pharmacy Antibiotic Note  Michael Chandler is a 37 y.o. male admitted on 06/25/2022 with sepsis.  Pharmacy has been consulted for Vancomycin dosing.  Plan: Vancomycin 1750 mg IV X 1 given in ED on 3/3 @ 2216. Vancomycin 750 mg IV Q8H ordered to start on 3/4 @ 0600.  AUC = 500.8 Vanc trough = 16.9  Height: '6\' 5"'$  (195.6 cm) Weight: 93.5 kg (206 lb 2.1 oz) IBW/kg (Calculated) : 89.1  Temp (24hrs), Avg:98.8 F (37.1 C), Min:98.6 F (37 C), Max:98.9 F (37.2 C)  Recent Labs  Lab 06/25/22 1958 06/25/22 2308  WBC 13.7*  --   CREATININE 1.73*  --   LATICACIDVEN 1.2 0.7    Estimated Creatinine Clearance: 75.1 mL/min (A) (by C-G formula based on SCr of 1.73 mg/dL (H)).    Allergies  Allergen Reactions   Bactrim [Sulfamethoxazole-Trimethoprim] Anaphylaxis    Antimicrobials this admission:   >>    >>   Dose adjustments this admission:   Microbiology results:  BCx:   UCx:    Sputum:    MRSA PCR:   Thank you for allowing pharmacy to be a part of this patient's care.  Daimen Shovlin D 06/26/2022 12:57 AM

## 2022-06-27 DIAGNOSIS — F1520 Other stimulant dependence, uncomplicated: Secondary | ICD-10-CM

## 2022-06-27 DIAGNOSIS — B20 Human immunodeficiency virus [HIV] disease: Secondary | ICD-10-CM | POA: Diagnosis not present

## 2022-06-27 DIAGNOSIS — A419 Sepsis, unspecified organism: Secondary | ICD-10-CM | POA: Diagnosis not present

## 2022-06-27 DIAGNOSIS — F203 Undifferentiated schizophrenia: Secondary | ICD-10-CM | POA: Diagnosis not present

## 2022-06-27 DIAGNOSIS — R6520 Severe sepsis without septic shock: Secondary | ICD-10-CM | POA: Diagnosis not present

## 2022-06-27 DIAGNOSIS — A310 Pulmonary mycobacterial infection: Secondary | ICD-10-CM | POA: Diagnosis not present

## 2022-06-27 LAB — CBC WITH DIFFERENTIAL/PLATELET
Abs Immature Granulocytes: 0.02 10*3/uL (ref 0.00–0.07)
Basophils Absolute: 0 10*3/uL (ref 0.0–0.1)
Basophils Relative: 0 %
Eosinophils Absolute: 0.1 10*3/uL (ref 0.0–0.5)
Eosinophils Relative: 1 %
HCT: 30.4 % — ABNORMAL LOW (ref 39.0–52.0)
Hemoglobin: 9.3 g/dL — ABNORMAL LOW (ref 13.0–17.0)
Immature Granulocytes: 0 %
Lymphocytes Relative: 28 %
Lymphs Abs: 2 10*3/uL (ref 0.7–4.0)
MCH: 24.2 pg — ABNORMAL LOW (ref 26.0–34.0)
MCHC: 30.6 g/dL (ref 30.0–36.0)
MCV: 79.2 fL — ABNORMAL LOW (ref 80.0–100.0)
Monocytes Absolute: 0.9 10*3/uL (ref 0.1–1.0)
Monocytes Relative: 13 %
Neutro Abs: 4.2 10*3/uL (ref 1.7–7.7)
Neutrophils Relative %: 58 %
Platelets: 306 10*3/uL (ref 150–400)
RBC: 3.84 MIL/uL — ABNORMAL LOW (ref 4.22–5.81)
RDW: 17.9 % — ABNORMAL HIGH (ref 11.5–15.5)
WBC: 7.2 10*3/uL (ref 4.0–10.5)
nRBC: 0 % (ref 0.0–0.2)

## 2022-06-27 LAB — BASIC METABOLIC PANEL
Anion gap: 3 — ABNORMAL LOW (ref 5–15)
BUN: 8 mg/dL (ref 6–20)
CO2: 25 mmol/L (ref 22–32)
Calcium: 8 mg/dL — ABNORMAL LOW (ref 8.9–10.3)
Chloride: 103 mmol/L (ref 98–111)
Creatinine, Ser: 1.03 mg/dL (ref 0.61–1.24)
GFR, Estimated: >60 mL/min (ref >60)
Glucose, Bld: 82 mg/dL (ref 70–99)
Potassium: 4 mmol/L (ref 3.5–5.1)
Sodium: 131 mmol/L — ABNORMAL LOW (ref 135–145)

## 2022-06-27 LAB — URINE DRUG SCREEN, QUALITATIVE (ARMC ONLY)
Amphetamines, Ur Screen: POSITIVE — AB
Barbiturates, Ur Screen: NOT DETECTED
Benzodiazepine, Ur Scrn: NOT DETECTED
Cannabinoid 50 Ng, Ur ~~LOC~~: NOT DETECTED
Cocaine Metabolite,Ur ~~LOC~~: NOT DETECTED
MDMA (Ecstasy)Ur Screen: NOT DETECTED
Methadone Scn, Ur: NOT DETECTED
Opiate, Ur Screen: NOT DETECTED
Phencyclidine (PCP) Ur S: NOT DETECTED
Tricyclic, Ur Screen: NOT DETECTED

## 2022-06-27 MED ORDER — NAPROXEN 250 MG PO TABS
250.0000 mg | ORAL_TABLET | Freq: Two times a day (BID) | ORAL | Status: DC
  Administered 2022-06-28 – 2022-06-29 (×4): 250 mg via ORAL
  Filled 2022-06-27 (×4): qty 1

## 2022-06-27 NOTE — Assessment & Plan Note (Signed)
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 Willeen Cass reports that Dr. Roma Schanz said he would be happy to see him as an outpatient. He was not able to get up with ID, but agreed that extensive nodal resection might be a consideration  -However, he is unlikely to be a candidate for an extensive neck dissection if he will not be compliant with medications

## 2022-06-27 NOTE — Consult Note (Signed)
Psychiatry consult: Came by to see patient together with infectious disease specialist at her request.  37 year old man well-known to the psychiatric service as well as the medical service.  Patient has a history of multiple contacts with psychiatry as well as psychiatric hospitalizations and has been diagnosed variously as having schizophrenia or substance-induced psychosis in the past and clearly has suffered from abuse problems with methamphetamine.  Patient is currently in the hospital primarily being treated for lymphadenopathy infection with Mycobacterium avium.  Patient was asleep when we came by but woke up.  Remains sleepy and interacted only somewhat passively.  Continues to claim that he takes his medicine regularly but when pressed with details will admit that sometimes it is not consistent but has multiple excuses such as not having a ride and not having money.  Patient denies that he had been using amphetamines at this time.  No drug screen was obtained but it was documented that he was having visual and tactile hallucinations typical of the psychosis he gets with amphetamines when he came into the hospital.  Patient did not appear to be actively psychotic did not appear to be hallucinating did not say anything delusional.  Both of [place] made multiple attempts to engage him in serious conversation expressing empathy and understanding for his problems and offering multiple opportunities to engage with ideas.  It was made very clear to him that continuing the way things have been with a lack of compliance with medication and ongoing substance abuse he is getting more and more severe infections which are likely to end up in his death sooner or later.  Patient denied suicidal ideation.  Do not recommend adding any other psychiatric medicine at this point.  Primary issue really is if he could engage with substance abuse treatment and then engage in what he needs to do to take care of himself.  Patient's  condition is worsened by his poor relationship with his family and his general tendency to be oppositional.  We will follow-up as needed.  No indication for psychiatric hospitalization.

## 2022-06-27 NOTE — Progress Notes (Signed)
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  He was lost to f/u and apparently noncompliant with medications.  He presented with police overnight for agitation and was found to have severe sepsis associated with cellulitis and abscess of the neck.  He was started on Cefepime and Flagyl with plan for ENT consultation, IR consultation for possible drainage, and need for ID consultation.  ENT has seen the patient and notes that these are chronic in nature with intermittent drainage.  He recommends IR and ID consultations with possible need for transfer to a tertiary care center should surgical intervention be needed since this would require an extensive neck dissection.  ID is consulting.  IR has looked at images and notes soft tissue swelling without a drainable abscess.  Psychiatry has also consulted.  He appears to need another day or more of IV antibiotics and then transition to PO.  ID would prefer SNF placement if possible and if patient would agree to allow for direct observed therapy.  Assessment and Plan: * Severe sepsis ([place]) -Thought to be associated with cellulitis of the neck, with known h/o MAI -SIRS criteria in this patient on admission included: tachycardia, tachypnea  -Patient had evidence of acute organ failure with recurrent hypotension (SBP < 90 or MAP < 65 x 2 readings) that was not easily explained by another condition. -While awaiting blood cultures, this appears to be a preseptic condition. 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) -Resolved, likely associated with acute infection -Recheck BMP in AM  HIV/AIDS (human immunodeficiency virus infection) ([place]) -CD4 in 02/2022 was 181 (improved from 99 in 11/2021) -Viral load from 02/2022 was 3134 -Currently reported to be on Tivicay-Truvada but last filled for 30 days in December so unlikely  -MAC - on ethambutol, azithromycin, rifabutin (compliance uncertain - not on Med rec) -Also supposed to be taking atovaquone - (compliance uncertain - not on Med rec) -He was due to f/u with ID in 2 weeks (so mid-January) and does not appear to have followed up -ID is consulting now -Continue Tivicay, Truvada, ethambutol per ID  Schizophrenia, undifferentiated ([facility]) -Continue Haldol, Cogentin, and trazodone, though compliance uncertain -Psychiatry has consulted and does not recommend medication changes at this time -Substance abuse severely complicates this issue  Amphetamine  dependence ([place]) -UDS has  been persistently positive for methamphetamines -He reportedly had hallucinations and a feeling a squirrels climbing all over him on presentation -Needs strict cessation as this severely complicates his mental health -ID is recommending SNF placement if possible, in order to assist with medication compliance  H/O 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  Acknowledges some missed doses of pills.  Physical Exam: Vitals:   06/26/22 0900 06/26/22 1600 06/26/22 2027 06/27/22 0841  BP: 117/74 118/85 109/70 112/72  Pulse: 83 78 68 78  Resp: '20 16 18 14  '$ Temp: 98 F (36.7 C) 97.9 F (36.6 C) 97.7 F (36.5 C) 98.7 F (37.1 C)  TempSrc: Oral  Oral   SpO2:  100% (!) 82% 100%  Weight:      Height:       General:  Appears somnolent, withdrawn, slightly more interactive today Eyes:  normal lids, iris ENT:  grossly normal hearing, lips & tongue, mmm Neck:  left neck fluctuant neck mass - appears somewhat smaller than on admission and with improving surrounding erythema Cardiovascular:  RRR, no m/r/g. No LE edema.  Respiratory:   CTA bilaterally with no wheezes/rales/rhonchi.  Normal respiratory effort. Abdomen:  soft, NT, ND Skin:  no rash or induration seen on limited exam other than as above Musculoskeletal:  grossly normal tone BUE/BLE, good ROM, no bony abnormality Psychiatric:  flat/somnolent mood and affect, speech quite sparse but improved from prior Neurologic:  unable to perform due to unwillingness of patient to participate in exam    Radiological Exams on Admission: Independently reviewed - see discussion in A/P where applicable  CT Soft Tissue Neck W Contrast  Result Date: 06/25/2022 CLINICAL DATA:  Initial evaluation for soft tissue  infection. EXAM: CT NECK WITH CONTRAST TECHNIQUE: Multidetector CT imaging of the neck was performed using the standard protocol following the bolus administration of intravenous contrast. RADIATION DOSE REDUCTION: This exam was performed according to the departmental dose-optimization program which includes automated exposure control, adjustment of the mA and/or kV according to patient size and/or use of iterative reconstruction technique. CONTRAST:  23m OMNIPAQUE IOHEXOL 300 MG/ML  SOLN COMPARISON:  Prior study from 03/15/2022. FINDINGS: Pharynx and larynx: Oral cavity within normal limits. Oropharynx and nasopharynx within normal limits. No retropharyngeal collection. Epiglottis within normal limits. Hypopharynx and supraglottic larynx within normal limits. Negative glottis. Subglottic airway patent clear. Salivary glands: Parotid glands and right submandibular gland are normal. Inflammatory changes partially surround the left submandibular gland related to the acute inflammatory process within the left neck. Gland itself felt to be within normal limits. Thyroid: Normal. Lymph nodes: Multiple enlarged lymph nodes and/or nodal conglomerate seen extending along the left cervical chain. For reference purposes, the largest node measures up to approximately 2.8 cm in short axis at level 2 (series 2, image 49). Few scattered areas of internal hypodensity consistent with internal suppuration and/or necrosis. Extensive overlying soft tissue swelling with inflammatory stranding within the adjacent anterolateral left neck. The left sternocleidomastoid muscle is thickened and edematous. Superimposed hypodense collection at the posterior margin of the left SCM at the left lateral neck measures 1.9 x 1.4 x 2.0 cm, consistent with abscess (series 2, image 61). Additional collection located superiorly within the left posterior paraspinous musculature measures 2.0 x  1.3 x 3.3 cm (series 2, image 42). An additional probable  collection positioned along the deep aspect of the left sternocleidomastoid muscle measures approximately 2.5 x 1.5 x 1.0 cm (series 2, image 65). Few additional scattered subcentimeter hypodensities noted within this region, likely small micro abscesses. No pathologic adenopathy seen within the contralateral right neck. Vascular: Normal intravascular enhancement seen throughout the neck. Left IJ is partially compressed by the left-sided adenopathy. Limited intracranial: Unremarkable. Visualized orbits: Unremarkable. Mastoids and visualized paranasal sinuses: Mild mucosal thickening present about the left maxillary sinus. Visualized paranasal sinuses are otherwise largely clear. Trace left mastoid effusion noted. Middle ear cavities are clear. Skeleton: No discrete or worrisome osseous lesions. Prior fusion at C4-C7 with C5 and C6 corpectomy. Prior posterior decompression with fusion at C3 through T1. No visible hardware complication. Upper chest: Visualized upper chest demonstrates no acute finding. Small bulla noted at the medial left upper lobe. Other: None. IMPRESSION: 1. Extensive soft tissue swelling with inflammatory stranding throughout the anterolateral left neck, concerning for acute soft tissue infection/cellulitis. Multiple superimposed hypodense collections within this region measure up to 3.3 cm, concerning for abscesses. 2. Superimposed extensive left-sided cervical adenopathy. Internal hypodensity within a few of these nodes consistent with suppuration and/or necrosis. While these findings are favored to reflect changes of infection, clinical follow-up to resolution recommended and to ensure no underlying neoplastic process is present. Correlation with dedicated nodal histologic sampling could be performed for further evaluation as warranted. 3. Prior fusion at C4-C7 with C5 and C6 corpectomy. Prior posterior decompression with fusion at C3 through T1. No visible hardware complication. Electronically  Signed   By: Jeannine Boga M.D.   On: 06/25/2022 21:28   DG Chest Port 1 View  Result Date: 06/25/2022 CLINICAL DATA:  Suspected sepsis EXAM: PORTABLE CHEST 1 VIEW COMPARISON:  04/03/2022 FINDINGS: Cardiac shadow is stable. The lungs are well aerated bilaterally without focal infiltrate or effusion. No acute bony abnormality is noted. IMPRESSION: No active disease. Electronically Signed   By: Inez Catalina M.D.   On: 06/25/2022 20:30    EKG: none today   Labs on Admission: I have personally reviewed the available labs and imaging studies at the time of the admission.  Pertinent labs:    Na++ 131 WBC 7.2 Hgb 9.3 Blood cultures NTD   Family Communication: None present, none listed as NOK on contacts, patient refuses  Disposition: Status is: Inpatient Remains inpatient appropriate because: ongoing IV antibiotics needed  Planned Discharge Destination:  to be determined     Time spent: 35 minutes  Author: Karmen Bongo, MD 06/27/2022 3:05 PM  For on call review [email or web address].

## 2022-06-27 NOTE — Consult Note (Signed)
[facility] Consult  06/27/2022 3:12 PM Marta Layden  MRN:  [id number]   Method of visit?: Face to Face   Subjective:  37 yo male states that he is "good." He denies SI/HI, hallucinations, poor appetite, depressed mood, worthless, insomnia.  HPI Patient has history of AIDS with MAI infection (per pt's office visit with infectious disease on 04/20/2022 - CD4 count = 48), substance abuse, schizophrenia. Patient states he ran out of his medications last week. However, patient has been non-adherent to medications and this is a common issue - multiple times in the past.  On evaluation, Patient has no concerns regarding his mood or mental health. He states he is "good" throughout the psych consult. Patients responses were limited to one word answers. He has a depressed affect but denies depression. He states he currently lives with his mom and feels safe at home and safe in the hospital. However, he doesn't have social support because he doesn't want mother to be involved.   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 No pertinent family history.  Social History:  Social History   Substance and Sexual Activity  Alcohol Use Yes   Comment: occasional     Social History   Substance and Sexual Activity  Drug Use Yes   Types: Methamphetamines, Marijuana, Cocaine   Comment: states occasional marijuana and meth use    Social History   Socioeconomic History   Marital status: Single    Spouse name: Not on file   Number of children: Not on file   Years of education: Not on file   Highest education level: Not on file  Occupational History   Not on file  Tobacco Use   Smoking status: Former    Types: Cigarettes   Smokeless tobacco: Never   Tobacco comments:    Patient denies using tobacco products (03/13/22)  Substance and Sexual Activity   Alcohol use: Yes    Comment: occasional   Drug use: Yes    Types: Methamphetamines, Marijuana, Cocaine    Comment: states occasional marijuana and meth use   Sexual activity: Not Currently    Comment: condoms accepted  Other Topics Concern   Not on file  Social History Narrative   Not on file   Social Determinants of Health   Financial Resource Strain: Not on file  Food Insecurity: No Food Insecurity (06/25/2022)   Hunger Vital Sign    Worried About Running Out of Food in the Last Year: Never true    Ran Out of Food in the Last Year: Never true  Transportation Needs: No Transportation Needs (06/25/2022)  PRAPARE - Hydrologist (Medical): No    Lack of Transportation (Non-Medical): No  Physical Activity: Not on file  Stress: Not on file  Social Connections: Not on file    Sleep:  Pt states "good"  Appetite:   Pt states "good"  Current Medications: Current Facility-Administered Medications  Medication Dose Route Frequency Provider Last Rate Last Admin   azithromycin (ZITHROMAX) tablet 500 mg  500 mg Oral Daily Athena Masse, MD   500 mg at 06/27/22 [id number]   benztropine  (COGENTIN) tablet 1 mg  1 mg Oral Daily Karmen Bongo, MD   1 mg at 06/27/22 0909   cefTRIAXone (ROCEPHIN) 2 g in sodium chloride 0.9 % 100 mL IVPB  2 g Intravenous Q24H Judd Gaudier V, MD 200 mL/hr at 06/27/22 0427 2 g at 06/27/22 0427   dolutegravir (TIVICAY) tablet 50 mg  50 mg Oral Daily Karmen Bongo, MD   50 mg at 06/27/22 0908   emtricitabine-tenofovir (TRUVADA) 200-300 MG per tablet 1 tablet  1 tablet Oral Daily Karmen Bongo, MD   1 tablet at 06/27/22 0908   ethambutol (MYAMBUTOL) tablet 1,200 mg  1,200 mg Oral Daily Tsosie Billing, MD   1,200 mg at 06/27/22 [id number]   haloperidol (HALDOL) tablet 5 mg  5 mg Oral BID Athena Masse, MD   5 mg at 06/27/22 [id number]   morphine (PF) 2 MG/ML injection 2 mg  2 mg Intravenous Q2H PRN Athena Masse, MD       ondansetron [facility]) tablet 4 mg  4 mg Oral Q6H PRN Athena Masse, MD       Or   ondansetron [facility]) injection 4 mg  4 mg Intravenous Q6H PRN Athena Masse, MD       rifabutin (MYCOBUTIN) capsule 300 mg  300 mg Oral Daily Tsosie Billing, MD   300 mg at 06/27/22 1034   traZODone (DESYREL) tablet 150 mg  150 mg Oral QHS Athena Masse, MD   150 mg at 06/26/22 2147    Lab Results:  Results for orders placed or performed during the hospital encounter of 06/25/22 (from the past 48 hour(s))  Comprehensive metabolic panel     Status: Abnormal   Collection Time: 06/25/22  7:58 PM  Result Value Ref Range   Sodium 135 135 - 145 mmol/L   Potassium 3.2 (L) 3.5 - 5.1 mmol/L   Chloride 104 98 - 111 mmol/L   CO2 19 (L) 22 - 32 mmol/L   Glucose, Bld 94 70 - 99 mg/dL    Comment: Glucose reference range applies only to samples taken after fasting for at least 8 hours.   BUN 13 6 - 20 mg/dL   Creatinine, Ser 1.73 (H) 0.61 - 1.24 mg/dL   Calcium 8.5 (L) 8.9 - 10.3 mg/dL   Total Protein 8.6 (H) 6.5 - 8.1 g/dL   Albumin 3.2 (L) 3.5 - 5.0 g/dL   AST 34 15 - 41 U/L   ALT 22 0 - 44 U/L   Alkaline Phosphatase 97 38 - 126 U/L    Total Bilirubin 0.9 0.3 - 1.2 mg/dL   GFR, Estimated 52 (L) >60 mL/min    Comment: (NOTE) Calculated using the CKD-EPI Creatinine Equation (2021)    Anion gap 12 5 - 15    Comment: Performed at [facility], [address] [place], [place] [address]  Lactic acid, plasma     Status: None   Collection Time: 06/25/22  7:58 PM  Result Value Ref Range   Lactic Acid, Venous 1.2 0.5 - 1.9 mmol/L    Comment: Performed at [facility], [address] [place], [place] [address]  CBC with Differential     Status: Abnormal   Collection Time: 06/25/22  7:58 PM  Result Value Ref Range   WBC 13.7 (H) 4.0 - 10.5 K/uL   RBC 3.85 (L) 4.22 - 5.81 MIL/uL   Hemoglobin 9.7 (L) 13.0 - 17.0 g/dL   HCT 31.9 (L) 39.0 - 52.0 %   MCV 82.9 80.0 - 100.0 fL   MCH 25.2 (L) 26.0 - 34.0 pg   MCHC 30.4 30.0 - 36.0 g/dL   RDW 18.0 (H) 11.5 - 15.5 %   Platelets 263 150 - 400 K/uL   nRBC 0.0 0.0 - 0.2 %   Neutrophils Relative % 76 %   Neutro Abs 10.4 (H) 1.7 - 7.7 K/uL   Lymphocytes Relative 12 %   Lymphs Abs 1.7 0.7 - 4.0 K/uL   Monocytes Relative 11 %   Monocytes Absolute 1.5 (H) 0.1 - 1.0 K/uL   Eosinophils Relative 0 %   Eosinophils Absolute 0.0 0.0 - 0.5 K/uL   Basophils Relative 0 %   Basophils Absolute 0.0 0.0 - 0.1 K/uL   Immature Granulocytes 1 %   Abs Immature Granulocytes 0.18 (H) 0.00 - 0.07 K/uL    Comment: Performed at [facility], [address] [place], [place] [address]  Culture, blood (Routine x 2)     Status: None (Preliminary result)   Collection Time: 06/25/22  8:16 PM   Specimen: BLOOD LEFT ARM  Result Value Ref Range   Specimen Description BLOOD LEFT ARM    Special Requests      BOTTLES DRAWN AEROBIC AND ANAEROBIC Blood Culture results may not be optimal due to an inadequate volume of blood received in culture bottles   Culture      NO GROWTH 2 DAYS Performed at [facility], [address], [place], [place] [address]    Report Status  PENDING   Culture, blood (Routine x 2)     Status: None (Preliminary result)   Collection Time: 06/25/22  8:25 PM   Specimen: Right Antecubital; Blood  Result Value Ref Range   Specimen Description RIGHT ANTECUBITAL    Special Requests      BOTTLES DRAWN AEROBIC AND ANAEROBIC Blood Culture adequate volume   Culture      NO GROWTH 2 DAYS Performed at [facility], [address] [place], [place] [address]    Report Status PENDING   Protime-INR     Status: None   Collection Time: 06/25/22  8:27 PM  Result Value Ref Range   Prothrombin Time 14.0 11.4 - 15.2 seconds   INR 1.1 0.8 - 1.2    Comment: (NOTE) INR goal varies based on device and disease states. Performed at [facility], [address] [place], [place] [address]   Resp panel by RT-PCR (RSV, Flu A&B, Covid) Anterior Nasal Swab     Status: None   Collection Time: 06/25/22  8:32 PM   Specimen: Anterior Nasal Swab  Result Value Ref Range   SARS Coronavirus 2 by RT PCR NEGATIVE NEGATIVE    Comment: (NOTE) SARS-CoV-2 target nucleic acids are NOT DETECTED.  The SARS-CoV-2 RNA is generally detectable in upper respiratory specimens during the acute phase of infection. The lowest concentration of SARS-CoV-2 viral copies this assay can detect is 138  copies/mL. A negative result does not preclude SARS-Cov-2 infection and should not be used as the sole basis for treatment or other patient management decisions. A negative result may occur with  improper specimen collection/handling, submission of specimen other than nasopharyngeal swab, presence of viral mutation(s) within the areas targeted by this assay, and inadequate number of viral copies(<138 copies/mL). A negative result must be combined with clinical observations, patient history, and epidemiological information. The expected result is Negative.  Fact Sheet for Patients:  EntrepreneurPulse.com.au  Fact Sheet for Healthcare  Providers:  IncredibleEmployment.be  This test is no t yet approved or cleared by the [place] FDA and  has been authorized for detection and/or diagnosis of SARS-CoV-2 by FDA under an Emergency Use Authorization (EUA). This EUA will remain  in effect (meaning this test can be used) for the duration of the COVID-19 declaration under Section 564(b)(1) of the Act, 21 U.S.C.section 360bbb-3(b)(1), unless the authorization is terminated  or revoked sooner.       Influenza A by PCR NEGATIVE NEGATIVE   Influenza B by PCR NEGATIVE NEGATIVE    Comment: (NOTE) The Xpert Xpress SARS-CoV-2/FLU/RSV plus assay is intended as an aid in the diagnosis of influenza from Nasopharyngeal swab specimens and should not be used as a sole basis for treatment. Nasal washings and aspirates are unacceptable for Xpert Xpress SARS-CoV-2/FLU/RSV testing.  Fact Sheet for Patients: EntrepreneurPulse.com.au  Fact Sheet for Healthcare Providers: IncredibleEmployment.be  This test is not yet approved or cleared by the [place] FDA and has been authorized for detection and/or diagnosis of SARS-CoV-2 by FDA under an Emergency Use Authorization (EUA). This EUA will remain in effect (meaning this test can be used) for the duration of the COVID-19 declaration under Section 564(b)(1) of the Act, 21 U.S.C. section 360bbb-3(b)(1), unless the authorization is terminated or revoked.     Resp Syncytial Virus by PCR NEGATIVE NEGATIVE    Comment: (NOTE) Fact Sheet for Patients: EntrepreneurPulse.com.au  Fact Sheet for Healthcare Providers: IncredibleEmployment.be  This test is not yet approved or cleared by the [place] FDA and has been authorized for detection and/or diagnosis of SARS-CoV-2 by FDA under an Emergency Use Authorization (EUA). This EUA will remain in effect (meaning this test can be used) for the  duration of the COVID-19 declaration under Section 564(b)(1) of the Act, 21 U.S.C. section 360bbb-3(b)(1), unless the authorization is terminated or revoked.  Performed at [facility], [address] [place], [place] [address]   Lactic acid, plasma     Status: None   Collection Time: 06/25/22 11:08 PM  Result Value Ref Range   Lactic Acid, Venous 0.7 0.5 - 1.9 mmol/L    Comment: Performed at [facility], [address] [place], [place] [address]  Protime-INR     Status: None   Collection Time: 06/26/22  3:26 AM  Result Value Ref Range   Prothrombin Time 14.2 11.4 - 15.2 seconds   INR 1.1 0.8 - 1.2    Comment: (NOTE) INR goal varies based on device and disease states. Performed at [facility], [address] [place], [place] [address]   Cortisol-am, blood     Status: Abnormal   Collection Time: 06/26/22  3:26 AM  Result Value Ref Range   Cortisol - AM 5.7 (L) 6.7 - 22.6 ug/dL    Comment: Performed at [facility] [address], [place], [place] [address]  Procalcitonin     Status: None   Collection Time:  06/26/22  3:26 AM  Result Value Ref Range   Procalcitonin 0.23 ng/mL    Comment:        Interpretation: PCT (Procalcitonin) <= 0.5 ng/mL: Systemic infection (sepsis) is not likely. Local bacterial infection is possible. (NOTE)       Sepsis PCT Algorithm           Lower Respiratory Tract                                      Infection PCT Algorithm    ----------------------------     ----------------------------         PCT < 0.25 ng/mL                PCT < 0.10 ng/mL          Strongly encourage             Strongly discourage   discontinuation of antibiotics    initiation of antibiotics    ----------------------------     -----------------------------       PCT 0.25 - 0.50 ng/mL            PCT 0.10 - 0.25 ng/mL               OR       >80% decrease in PCT            Discourage initiation of                                             antibiotics      Encourage discontinuation           of antibiotics    ----------------------------     -----------------------------         PCT >= 0.50 ng/mL              PCT 0.26 - 0.50 ng/mL               AND        <80% decrease in PCT             Encourage initiation of                                             antibiotics       Encourage continuation           of antibiotics    ----------------------------     -----------------------------        PCT >= 0.50 ng/mL                  PCT > 0.50 ng/mL               AND         increase in PCT                  Strongly encourage                                      initiation of antibiotics    Strongly encourage escalation  of antibiotics                                     -----------------------------                                           PCT <= 0.25 ng/mL                                                 OR                                        > 80% decrease in PCT                                      Discontinue / Do not initiate                                             antibiotics  Performed at [facility], [address] [place], [place] [id number]   Basic metabolic panel     Status: Abnormal   Collection Time: 06/26/22  3:26 AM  Result Value Ref Range   Sodium 136 135 - 145 mmol/L   Potassium 3.4 (L) 3.5 - 5.1 mmol/L   Chloride 107 98 - 111 mmol/L   CO2 23 22 - 32 mmol/L   Glucose, Bld 86 70 - 99 mg/dL    Comment: Glucose reference range applies only to samples taken after fasting for at least 8 hours.   BUN 12 6 - 20 mg/dL   Creatinine, Ser 1.34 (H) 0.61 - 1.24 mg/dL   Calcium 8.3 (L) 8.9 - 10.3 mg/dL   GFR, Estimated >60 >60 mL/min    Comment: (NOTE) Calculated using the CKD-EPI Creatinine Equation (2021)    Anion gap 6 5 - 15    Comment: Performed at [facility], [address] [place], [place] [address]  CBC     Status: Abnormal   Collection Time: 06/26/22  3:26  AM  Result Value Ref Range   WBC 8.2 4.0 - 10.5 K/uL   RBC 3.61 (L) 4.22 - 5.81 MIL/uL   Hemoglobin 9.0 (L) 13.0 - 17.0 g/dL   HCT 28.5 (L) 39.0 - 52.0 %   MCV 78.9 (L) 80.0 - 100.0 fL   MCH 24.9 (L) 26.0 - 34.0 pg   MCHC 31.6 30.0 - 36.0 g/dL   RDW 17.7 (H) 11.5 - 15.5 %   Platelets 273 150 - 400 K/uL   nRBC 0.0 0.0 - 0.2 %    Comment: Performed at [facility], [address] [place], [place] [address]  CBC with Differential/Platelet     Status: Abnormal   Collection Time: 06/27/22  4:28 AM  Result Value Ref Range   WBC 7.2 4.0 - 10.5 K/uL   RBC 3.84 (L) 4.22 - 5.81 MIL/uL  Hemoglobin 9.3 (L) 13.0 - 17.0 g/dL   HCT 30.4 (L) 39.0 - 52.0 %   MCV 79.2 (L) 80.0 - 100.0 fL   MCH 24.2 (L) 26.0 - 34.0 pg   MCHC 30.6 30.0 - 36.0 g/dL   RDW 17.9 (H) 11.5 - 15.5 %   Platelets 306 150 - 400 K/uL   nRBC 0.0 0.0 - 0.2 %   Neutrophils Relative % 58 %   Neutro Abs 4.2 1.7 - 7.7 K/uL   Lymphocytes Relative 28 %   Lymphs Abs 2.0 0.7 - 4.0 K/uL   Monocytes Relative 13 %   Monocytes Absolute 0.9 0.1 - 1.0 K/uL   Eosinophils Relative 1 %   Eosinophils Absolute 0.1 0.0 - 0.5 K/uL   Basophils Relative 0 %   Basophils Absolute 0.0 0.0 - 0.1 K/uL   Immature Granulocytes 0 %   Abs Immature Granulocytes 0.02 0.00 - 0.07 K/uL    Comment: Performed at [facility], [address], [place], [place] [id number]  Basic metabolic panel     Status: Abnormal   Collection Time: 06/27/22  4:28 AM  Result Value Ref Range   Sodium 131 (L) 135 - 145 mmol/L   Potassium 4.0 3.5 - 5.1 mmol/L   Chloride 103 98 - 111 mmol/L   CO2 25 22 - 32 mmol/L   Glucose, Bld 82 70 - 99 mg/dL    Comment: Glucose reference range applies only to samples taken after fasting for at least 8 hours.   BUN 8 6 - 20 mg/dL   Creatinine, Ser 1.03 0.61 - 1.24 mg/dL   Calcium 8.0 (L) 8.9 - 10.3 mg/dL   GFR, Estimated >60 >60 mL/min    Comment: (NOTE) Calculated using the CKD-EPI Creatinine Equation (2021)     Anion gap 3 (L) 5 - 15    Comment: Performed at [facility], [address] [place], [place] [address]    Blood Alcohol level:  Lab Results  Component Value Date   [facility] <10 05/21/2022   ETH <10 05/10/2022    Musculoskeletal: Strength & Muscle Tone:  pt appears weak Gait & Station:  did not assess Patient leans: N/A  Psychiatric Specialty Exam:  Presentation  General Appearance:  Appropriate for Environment  Eye Contact: Fair  Speech: Clear and Coherent  Speech Volume: Normal  Handedness: Right   Mood and Affect  Mood: Dysphoric  Affect: Congruent   Thought Process  Thought Processes: Disorganized; Irrevelant  Descriptions of Associations:Tangential  Orientation:Full (Time, Place and Person)  Thought Content:Illogical  History of Schizophrenia/Schizoaffective disorder:Yes  Duration of Psychotic Symptoms:Greater than six months  Hallucinations:No data recorded Ideas of Reference:Paranoia; Delusions  Suicidal Thoughts:No data recorded Homicidal Thoughts:No data recorded  Sensorium  Memory: Immediate Poor; Remote Poor  Judgment: Impaired  Insight: None   Executive Functions  Concentration: Fair  Attention Span: Fair  Recall: [place] of Knowledge: Fair  Language: Fair   Physical Exam: Physical Exam Vitals and nursing note reviewed.  HENT:     Head: Normocephalic and atraumatic.  Psychiatric:        Attention and Perception: He does not perceive auditory or visual hallucinations.        Mood and Affect: Mood is depressed. Mood is not anxious. Affect is blunt.        Behavior: Behavior is cooperative.        Thought Content: Thought content is not paranoid or delusional. Thought content does not include homicidal or suicidal  ideation.    Review of Systems  Psychiatric/Behavioral:  Negative for hallucinations and suicidal ideas. The patient is not nervous/anxious and does not have insomnia.    Blood  pressure 112/72, pulse 78, temperature 98.7 F (37.1 C), resp. rate 14, height '6\' 5"'$  (1.956 m), weight 93.5 kg, SpO2 100 %. Body mass index is 24.44 kg/m.  Treatment Plan Summary: Patient's PHQ9 score today = 0. He denies any depressed mood, anxiety, or hallucinations. Patient appears to be minimizing his disease progression. Educated the patient on the importance of medication compliance. Per Dr. Weber Cooks - "do not recommend adding any psychiatric medicines at this point."   [facility], Student-PA 06/27/2022, 3:12 PM

## 2022-06-27 NOTE — Progress Notes (Signed)
Date of Admission:  06/25/2022     ID: Michael Chandler is a 37 y.o. 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 MSSA epidural abscess s/p surgical decompression and fusion with hardware 2021    Subjective: Pt seen with Dr.Clapacs psychiatrist HE was sleeping but woke up and had a reluctant conversation C/o pain left side of neck Thinks the swelling is softer Says he has been taking meds regularly but last filled prescription on 04/08/22- spoke to the outside pharmacy- they gave him one month supply only eventhough he keeps saying he got 2 months Says he has no ride to come to visits Still uses meth but not sure when he last used it Could be a week ago he says though he came c/o squirrels attacking him and running all over his body  Medications:   azithromycin  500 mg Oral Daily   benztropine  1 mg Oral Daily   dolutegravir  50 mg Oral Daily   emtricitabine-tenofovir  1 tablet Oral Daily   ethambutol  1,200 mg Oral Daily   haloperidol  5 mg Oral BID   rifabutin  300 mg Oral Daily   traZODone  150 mg Oral QHS    Objective: Vital signs in last 24 hours: Temp:  [97.7 F (36.5 C)-98.7 F (37.1 C)] 98.7 F (37.1 C) (03/05 0841) Pulse Rate:  [68-78] 78 (03/05 0841) Resp:  [14-18] 14 (03/05 0841) BP: (109-118)/(70-85) 112/72 (03/05 0841) SpO2:  [82 %-100 %] 100 % (03/05 0841)   PHYSICAL EXAM:  General: Sleepy cooperative,  Head: Normocephalic, without obvious abnormality, atraumatic. Eyes: Conjunctivae clear, anicteric sclerae. Pupils are equal ENT Nares normal. No drainage or sinus tenderness. Lips, mucosa, and tongue normal. No  Thrush Neck: left sided soft swelling- conglomeration of cervical lymphnodes Sinu- draining pus  Back: No CVA tenderness. Lungs: Clear to auscultation bilaterally. No Wheezing or Rhonchi. No rales. Heart: Regular rate and rhythm, no murmur, rub or gallop. Abdomen: Soft, non-tender,not distended. Bowel sounds normal. No masses Extremities: atraumatic, no cyanosis. No edema. No clubbing Skin: No rashes or lesions. Or bruising Lymph: Cervical, supraclavicular normal. Neurologic: Grossly non-focal  Lab Results Recent Labs    06/26/22 0326 06/27/22 0428  WBC 8.2 7.2  HGB 9.0* 9.3*  HCT 28.5* 30.4*  NA 136 131*  K 3.4* 4.0  CL 107 103  CO2 23 25  BUN 12 8  CREATININE 1.34* 1.03   Liver Panel Recent Labs    06/25/22 1958  PROT 8.6*  ALBUMIN 3.2*  AST 34  ALT 22  ALKPHOS 97  BILITOT 0.9     Microbiology: BC - Neg Studies/Results: CT Soft Tissue Neck W Contrast  Result Date: 06/25/2022 CLINICAL DATA:  Initial evaluation for soft tissue infection. EXAM: CT NECK WITH CONTRAST TECHNIQUE: Multidetector CT imaging of the neck was performed using the standard protocol following the bolus administration of intravenous contrast. RADIATION DOSE REDUCTION: This exam was performed according to the departmental dose-optimization program which includes automated exposure control, adjustment of the mA and/or kV according to patient size and/or use of iterative reconstruction technique. CONTRAST:  37m OMNIPAQUE IOHEXOL 300 MG/ML  SOLN COMPARISON:  Prior study from 03/15/2022.  FINDINGS: Pharynx and larynx: Oral cavity within normal limits. Oropharynx and nasopharynx within normal limits. No retropharyngeal collection. Epiglottis within normal limits. Hypopharynx and supraglottic larynx within normal limits. Negative glottis. Subglottic airway patent clear. Salivary glands: Parotid glands and right submandibular gland are normal. Inflammatory changes partially surround the left submandibular  gland related to the acute inflammatory process within the left neck. Gland itself felt to be within normal limits. Thyroid: Normal. Lymph nodes: Multiple enlarged lymph nodes and/or nodal conglomerate seen extending along the left cervical chain. For reference purposes, the largest node measures up to approximately 2.8 cm in short axis at level 2 (series 2, image 49). Few scattered areas of internal hypodensity consistent with internal suppuration and/or necrosis. Extensive overlying soft tissue swelling with inflammatory stranding within the adjacent anterolateral left neck. The left sternocleidomastoid muscle is thickened and edematous. Superimposed hypodense collection at the posterior margin of the left SCM at the left lateral neck measures 1.9 x 1.4 x 2.0 cm, consistent with abscess (series 2, image 61). Additional collection located superiorly within the left posterior paraspinous musculature measures 2.0 x 1.3 x 3.3 cm (series 2, image 42). An additional probable collection positioned along the deep aspect of the left sternocleidomastoid muscle measures approximately 2.5 x 1.5 x 1.0 cm (series 2, image 65). Few additional scattered subcentimeter hypodensities noted within this region, likely small micro abscesses. No pathologic adenopathy seen within the contralateral right neck. Vascular: Normal intravascular enhancement seen throughout the neck. Left IJ is partially compressed by the left-sided adenopathy. Limited intracranial: Unremarkable. Visualized orbits: Unremarkable. Mastoids and visualized paranasal sinuses: Mild mucosal thickening present about the left maxillary sinus. Visualized paranasal sinuses are otherwise largely clear. Trace left mastoid effusion noted. Middle ear cavities are clear. Skeleton: No discrete or worrisome osseous lesions. Prior fusion at C4-C7 with C5 and C6 corpectomy. Prior posterior decompression with fusion at C3 through T1. No visible hardware complication. Upper chest:  Visualized upper chest demonstrates no acute finding. Small bulla noted at the medial left upper lobe. Other: None. IMPRESSION: 1. Extensive soft tissue swelling with inflammatory stranding throughout the anterolateral left neck, concerning for acute soft tissue infection/cellulitis. Multiple superimposed hypodense collections within this region measure up to 3.3 cm, concerning for abscesses. 2. Superimposed extensive left-sided cervical adenopathy. Internal hypodensity within a few of these nodes consistent with suppuration and/or necrosis. While these findings are favored to reflect changes of infection, clinical follow-up to resolution recommended and to ensure no underlying neoplastic process is present. Correlation with dedicated nodal histologic sampling could be performed for further evaluation as warranted. 3. Prior fusion at C4-C7 with C5 and C6 corpectomy. Prior posterior decompression with fusion at C3 through T1. No visible hardware complication. Electronically Signed   By: Jeannine Boga M.D.   On: 06/25/2022 21:28   DG Chest Port 1 View  Result Date: 06/25/2022 CLINICAL DATA:  Suspected sepsis EXAM: PORTABLE CHEST 1 VIEW COMPARISON:  04/03/2022 FINDINGS: Cardiac shadow is stable. The lungs are well aerated bilaterally without focal infiltrate or effusion. No acute bony abnormality is noted. IMPRESSION: No active disease. Electronically Signed   By: Inez Catalina M.D.   On: 06/25/2022 20:30     Assessment/Plan: 37 yr male presenting with feeling of squirrel running all over him and hallucinating Likely related to substance use - HE has h/o crystal meth use- will need urine tox He has underlying schizophrenia   Mycobacterium avium intracellulare infection of cervical lymph nodes- This is an immune reconstitiuion phenomenon. Blood culture in  Nov was negative for AFB Pt last filled prescriptions on 12/16. So he very likely is not taking his meds as prescribed He is supposed to be on  Ethambutol '1200mg'$ , Azithromycin '500mg'$  and rifabutin '300mg'$  once a day The lymphadenopathy is extensive and there is sinus formation- Unlikely this will resolve just with anti MAC rx- will need drainage/excision. 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 Anemia?   AKI  PT seen with Dr.Clapacs Discussed with him in detail the seriousness of his illness, prognosis, need for compliance

## 2022-06-27 NOTE — Assessment & Plan Note (Addendum)
-  UDS has been persistently positive for methamphetamines and is again -He reportedly had hallucinations and a feeling a squirrels climbing all over him on presentation -Needs strict cessation as this severely complicates his mental health -ID recommended SNF placement in order to assist with medication compliance but TOC team reports that this is not possible

## 2022-06-28 DIAGNOSIS — F1520 Other stimulant dependence, uncomplicated: Secondary | ICD-10-CM | POA: Diagnosis not present

## 2022-06-28 DIAGNOSIS — A310 Pulmonary mycobacterial infection: Secondary | ICD-10-CM | POA: Diagnosis not present

## 2022-06-28 DIAGNOSIS — A419 Sepsis, unspecified organism: Secondary | ICD-10-CM | POA: Diagnosis not present

## 2022-06-28 DIAGNOSIS — B20 Human immunodeficiency virus [HIV] disease: Secondary | ICD-10-CM | POA: Diagnosis not present

## 2022-06-28 DIAGNOSIS — R6520 Severe sepsis without septic shock: Secondary | ICD-10-CM | POA: Diagnosis not present

## 2022-06-28 LAB — HELPER T-LYMPH-CD4 (ARMC ONLY)
% CD 4 Pos. Lymph.: 7.8 % — ABNORMAL LOW (ref 30.8–58.5)
Absolute CD 4 Helper: 148 /uL — ABNORMAL LOW (ref 359–1519)
Basophils Absolute: 0 10*3/uL (ref 0.0–0.2)
Basos: 0 %
EOS (ABSOLUTE): 0.1 10*3/uL (ref 0.0–0.4)
Eos: 1 %
Hematocrit: 29.3 % — ABNORMAL LOW (ref 37.5–51.0)
Hemoglobin: 9.6 g/dL — ABNORMAL LOW (ref 13.0–17.7)
Immature Grans (Abs): 0 10*3/uL (ref 0.0–0.1)
Immature Granulocytes: 0 %
Lymphocytes Absolute: 1.9 10*3/uL (ref 0.7–3.1)
Lymphs: 27 %
MCH: 25.1 pg — ABNORMAL LOW (ref 26.6–33.0)
MCHC: 32.8 g/dL (ref 31.5–35.7)
MCV: 77 fL — ABNORMAL LOW (ref 79–97)
Monocytes Absolute: 1 10*3/uL — ABNORMAL HIGH (ref 0.1–0.9)
Monocytes: 13 %
Neutrophils Absolute: 4.1 10*3/uL (ref 1.4–7.0)
Neutrophils: 59 %
Platelets: 323 10*3/uL (ref 150–450)
RBC: 3.82 x10E6/uL — ABNORMAL LOW (ref 4.14–5.80)
RDW: 16.9 % — ABNORMAL HIGH (ref 11.6–15.4)
WBC: 7.2 10*3/uL (ref 3.4–10.8)

## 2022-06-28 LAB — CBC WITH DIFFERENTIAL/PLATELET
Abs Immature Granulocytes: 0.02 10*3/uL (ref 0.00–0.07)
Basophils Absolute: 0 10*3/uL (ref 0.0–0.1)
Basophils Relative: 0 %
Eosinophils Absolute: 0.1 10*3/uL (ref 0.0–0.5)
Eosinophils Relative: 1 %
HCT: 30.1 % — ABNORMAL LOW (ref 39.0–52.0)
Hemoglobin: 9.5 g/dL — ABNORMAL LOW (ref 13.0–17.0)
Immature Granulocytes: 0 %
Lymphocytes Relative: 28 %
Lymphs Abs: 2.1 10*3/uL (ref 0.7–4.0)
MCH: 24.5 pg — ABNORMAL LOW (ref 26.0–34.0)
MCHC: 31.6 g/dL (ref 30.0–36.0)
MCV: 77.8 fL — ABNORMAL LOW (ref 80.0–100.0)
Monocytes Absolute: 1.1 10*3/uL — ABNORMAL HIGH (ref 0.1–1.0)
Monocytes Relative: 15 %
Neutro Abs: 4 10*3/uL (ref 1.7–7.7)
Neutrophils Relative %: 56 %
Platelets: 294 10*3/uL (ref 150–400)
RBC: 3.87 MIL/uL — ABNORMAL LOW (ref 4.22–5.81)
RDW: 17.6 % — ABNORMAL HIGH (ref 11.5–15.5)
WBC: 7.2 10*3/uL (ref 4.0–10.5)
nRBC: 0 % (ref 0.0–0.2)

## 2022-06-28 LAB — HIV-1 RNA QUANT-NO REFLEX-BLD
HIV 1 RNA Quant: 181000 copies/mL
LOG10 HIV-1 RNA: 5.258 log10copy/mL

## 2022-06-28 LAB — BASIC METABOLIC PANEL
Anion gap: 5 (ref 5–15)
BUN: 6 mg/dL (ref 6–20)
CO2: 24 mmol/L (ref 22–32)
Calcium: 8.1 mg/dL — ABNORMAL LOW (ref 8.9–10.3)
Chloride: 104 mmol/L (ref 98–111)
Creatinine, Ser: 0.95 mg/dL (ref 0.61–1.24)
GFR, Estimated: >60 mL/min (ref >60)
Glucose, Bld: 92 mg/dL (ref 70–99)
Potassium: 3.9 mmol/L (ref 3.5–5.1)
Sodium: 133 mmol/L — ABNORMAL LOW (ref 135–145)

## 2022-06-28 NOTE — Progress Notes (Signed)
Date of Admission:  06/25/2022     ID: Michael Chandler is a 37 y.o. 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 Left neck pain is improving Sinus draining  Medications:   azithromycin  500 mg Oral Daily   benztropine  1 mg Oral Daily   dolutegravir  50 mg Oral Daily   emtricitabine-tenofovir  1 tablet Oral Daily   ethambutol  1,200 mg Oral Daily   haloperidol  5 mg Oral BID   naproxen  250 mg Oral BID WC   rifabutin  300 mg Oral Daily   traZODone  150 mg Oral QHS    Objective: Vital signs in last 24 hours: Patient Vitals for the past 24 hrs:  BP Temp Temp src Pulse Resp SpO2  06/28/22 0750 117/82 (!) 97.4 F (36.3 C) -- 78 16 100 %  06/28/22 0445 101/62 98.2 F (36.8 C) -- 77 18 100 %  06/27/22 2022 (!) 103/58 98.9 F (37.2 C) -- 94 20 100 %  06/27/22 1613 (!) 94/44 98.6 F (37 C) Oral 90 16 100 %      PHYSICAL EXAM:  General: More awake and alert,  Head: Normocephalic, without obvious abnormality, atraumatic. Eyes: Conjunctivae clear, anicteric sclerae. Pupils are equal ENT Nares normal. No drainage or sinus tenderness. Lips, mucosa, and tongue normal. No Thrush Neck: left sided soft swelling- conglomeration of cervical lymphnodes Sinu- draining pus   Back: No CVA tenderness. Lungs: Clear to auscultation bilaterally. No Wheezing or Rhonchi. No rales. Heart: Regular rate and rhythm, no murmur, rub or gallop. Abdomen: Soft, non-tender,not distended. Bowel sounds normal. No masses Extremities: atraumatic, no cyanosis. No  edema. No clubbing Skin: No rashes or lesions. Or bruising Lymph: Cervical, supraclavicular normal. Neurologic: Grossly non-focal  Lab Results Recent Labs    06/27/22 0428 06/28/22 0310  WBC 7.2 7.2  HGB 9.3* 9.5*  HCT 30.4* 30.1*  NA 131* 133*  K 4.0 3.9  CL 103 104  CO2 25 24  BUN 8 6  CREATININE 1.03 0.95   Liver Panel Recent Labs    06/25/22 1958  PROT 8.6*  ALBUMIN 3.2*  AST 34  ALT 22  ALKPHOS 97  BILITOT 0.9     Microbiology: BC - Neg    Assessment/Plan: 37 yr male presenting with feeling of squirrel running all over him and hallucinating Related to methamphetamine use.  Urine tox screen positive for the same. He has underlying schizophrenia   Mycobacterium avium intracellulare infection of cervical lymph nodes- This is an immune reconstitiuion phenomenon. Blood culture in Nov was negative for AFB Pt last filled prescriptions on 12/16.  He did not keep his appointments with me in January.  So he is noncompliant with medication as well as visits He is supposed to be on Ethambutol '1200mg'$ , Azithromycin '500mg'$  and rifabutin '300mg'$  once a day The lymphadenopathy is extensive and there is sinus formation- Unlikely this will resolve just with anti MAC rx- will need drainage/excision. Seen by ENT who wants him referred to tertiary center because of complexity of his illness.  Dr. Ihor Austin at  [facility] can see him as outpatient. Added NSAID to reduce the inflammation- will not do steroids now as he will be at risk for other OI with steroids   AIDS- last VL 4180 and cd4 was 181 on 03/18/22.  Now the CD4 is 148 ( 8%) and viral load was 180,000 from 06/27/2022 . so he is clearly not taking his HIV medicines.  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 Anemia?   AKI resolved   Discussed with him in detail the seriousness of his illness,and  his poor prognosis as he continues to be noncompliant with treatment

## 2022-06-28 NOTE — Progress Notes (Signed)
Progress Note   Patient: Michael Chandler [id number] DOB: [birth year] DOA: 06/25/2022     3 DOS: the patient was seen and examined on 06/28/2022   Brief hospital course: Patient with h/o AIDS with MAI infection and cervical LAD, schizophrenia, polysubstance abuse, C5-7 discitis and MSSA epidural abscess s/p surgical decompression and fusion with hardware.  He was lost to f/u and apparently noncompliant with medications.  He presented with police overnight for agitation and was found to have severe sepsis associated with cellulitis and abscess of the neck.  He was started on Cefepime and Flagyl with plan for ENT consultation, IR consultation for possible drainage, and need for ID consultation.  ENT has seen the patient and notes that these are chronic in nature with intermittent drainage.  He recommends IR and ID consultations with possible need for transfer to a tertiary care center should surgical intervention be needed since this would require an extensive neck dissection.  ID is consulting.  IR has looked at images and notes soft tissue swelling without a drainable abscess.  Psychiatry has also consulted.  He appears to need another day or more of IV antibiotics and then transition to PO.  ID would prefer SNF placement if possible and if patient would agree to allow for direct observed therapy.  Unfortunately, TOC reports that this is not an option based on his insurance.  ENT has reached out to [facility] colleagues to see if surgical intervention would be helpful.  Assessment and Plan: * Severe sepsis ([place]) -Thought to be associated with cellulitis of the neck, with known h/o MAI -SIRS criteria in this patient on admission included: tachycardia, tachypnea  -Patient had evidence of acute organ failure with recurrent hypotension (SBP < 90 or MAP < 65 x 2 readings) that was not easily explained by another condition. -While awaiting blood cultures, this appears to be a preseptic condition. 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 Richardson Landry reports that Dr. Ihor Austin said he would be happy to see him as an outpatient. He was not able to get up with ID, but agreed that extensive nodal resection might be a consideration  -However, he is unlikely to be a candidate for an extensive neck dissection if he will not be compliant with medications  AKI (acute kidney injury) ([place]) -Resolved, likely associated with acute infection -Recheck BMP in AM  AIDS (acquired immune deficiency syndrome) ([place]) -CD4 in 02/2022 was 181 (improved from 99 in 11/2021) -Viral load from 02/2022 was 3134 -Currently reported to be on Tivicay-Truvada but last filled for 30 days in December so unlikely  -MAC - on ethambutol, azithromycin, rifabutin (compliance uncertain - not on Med rec) -Also supposed to be taking atovaquone - (compliance uncertain -  not on Med rec) -He was due to f/u with ID in 2 weeks (so mid-January) and does not appear to have followed  up -ID is consulting now -Continue Tivicay, Truvada, ethambutol per ID  Schizophrenia, undifferentiated (HCC) -Continue Haldol, Cogentin, and trazodone, though compliance uncertain -Psychiatry has consulted and does not recommend medication changes at this time -Substance abuse severely complicates this issue  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[place]) -UDS has been persistently positive for methamphetamines and is again -He reportedly had hallucinations and a feeling a squirrels climbing all over him on presentation -Needs strict cessation as this severely complicates his mental health -ID recommended SNF placement in order to assist with medication compliance but TOC team reports that this is not possible  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  His neck is draining purulent fluid.  Physical Exam: Vitals:   06/27/22 1613 06/27/22 2022 06/28/22 0445 06/28/22 0750  BP: (!) 94/44 (!) 103/58 101/62 117/82  Pulse: 90 94 77 78  Resp: '16 20 18 16  '$ Temp: 98.6 F (37 C) 98.9 F (37.2 C) 98.2 F (36.8 C) (!) 97.4 F (36.3 C)  TempSrc: Oral     SpO2: 100% 100% 100% 100%  Weight:      Height:       General:  Appears somnolent, withdrawn, slightly more interactive today, followed some commands and answered more questions before he stopped today Eyes:  normal lids, iris ENT:  grossly normal hearing, lips & tongue, mmm Neck:  left neck fluctuant neck mass - top photo is on presentation and bottom photo is today - improved surrounding erythema but wound itself is unchanged and fullness extends along L lateral and posterior neck without obvious improvement      Cardiovascular:  RRR, no m/r/g. No LE edema.  Respiratory:   CTA  bilaterally with no wheezes/rales/rhonchi.  Normal respiratory effort. Abdomen:  soft, NT, ND Skin:  no rash or induration seen on limited exam other than as above Musculoskeletal:  grossly normal tone BUE/BLE, good ROM, no bony abnormality Psychiatric:  flat/somnolent mood and affect, speech sparse but improving Neurologic:  unable to perform due to unwillingness of patient to participate in exam   Radiological Exams on Admission: Independently reviewed - see discussion in A/P where applicable  No results found.  EKG: none since 3/3   Labs on Admission: I have personally reviewed the available labs and imaging studies at the time of the admission.  Pertinent labs:    Na++ 133 WBC 7.2 Hgb 9.5 - stable UDS + amphetamines Gram stain of wound with rare WBC, no orgs, culture pending  Family Communication: None provided by patient  Disposition: Status is: Inpatient Remains inpatient appropriate because: IV antibiotics  Planned Discharge Destination: Home    Time spent: 35 minutes  Author: Karmen Bongo, MD 06/28/2022 3:52 PM  For on call review [email or web address].

## 2022-06-29 ENCOUNTER — Other Ambulatory Visit (HOSPITAL_COMMUNITY): Payer: Self-pay

## 2022-06-29 DIAGNOSIS — A310 Pulmonary mycobacterial infection: Secondary | ICD-10-CM | POA: Diagnosis not present

## 2022-06-29 DIAGNOSIS — Z8739 Personal history of other diseases of the musculoskeletal system and connective tissue: Secondary | ICD-10-CM | POA: Diagnosis not present

## 2022-06-29 DIAGNOSIS — N179 Acute kidney failure, unspecified: Secondary | ICD-10-CM | POA: Diagnosis not present

## 2022-06-29 DIAGNOSIS — R6520 Severe sepsis without septic shock: Secondary | ICD-10-CM | POA: Diagnosis not present

## 2022-06-29 DIAGNOSIS — A419 Sepsis, unspecified organism: Secondary | ICD-10-CM | POA: Diagnosis not present

## 2022-06-29 DIAGNOSIS — D649 Anemia, unspecified: Secondary | ICD-10-CM

## 2022-06-29 DIAGNOSIS — B20 Human immunodeficiency virus [HIV] disease: Secondary | ICD-10-CM | POA: Diagnosis not present

## 2022-06-29 DIAGNOSIS — F203 Undifferentiated schizophrenia: Secondary | ICD-10-CM | POA: Diagnosis not present

## 2022-06-29 DIAGNOSIS — F1520 Other stimulant dependence, uncomplicated: Secondary | ICD-10-CM | POA: Diagnosis not present

## 2022-06-29 LAB — BASIC METABOLIC PANEL
Anion gap: 5 (ref 5–15)
BUN: 8 mg/dL (ref 6–20)
CO2: 24 mmol/L (ref 22–32)
Calcium: 8.2 mg/dL — ABNORMAL LOW (ref 8.9–10.3)
Chloride: 106 mmol/L (ref 98–111)
Creatinine, Ser: 0.81 mg/dL (ref 0.61–1.24)
GFR, Estimated: >60 mL/min (ref >60)
Glucose, Bld: 88 mg/dL (ref 70–99)
Potassium: 4 mmol/L (ref 3.5–5.1)
Sodium: 135 mmol/L (ref 135–145)

## 2022-06-29 LAB — CBC WITH DIFFERENTIAL/PLATELET
Abs Immature Granulocytes: 0.01 10*3/uL (ref 0.00–0.07)
Basophils Absolute: 0 10*3/uL (ref 0.0–0.1)
Basophils Relative: 0 %
Eosinophils Absolute: 0.1 10*3/uL (ref 0.0–0.5)
Eosinophils Relative: 2 %
HCT: 31.9 % — ABNORMAL LOW (ref 39.0–52.0)
Hemoglobin: 9.9 g/dL — ABNORMAL LOW (ref 13.0–17.0)
Immature Granulocytes: 0 %
Lymphocytes Relative: 36 %
Lymphs Abs: 1.6 10*3/uL (ref 0.7–4.0)
MCH: 24.5 pg — ABNORMAL LOW (ref 26.0–34.0)
MCHC: 31 g/dL (ref 30.0–36.0)
MCV: 79 fL — ABNORMAL LOW (ref 80.0–100.0)
Monocytes Absolute: 0.8 10*3/uL (ref 0.1–1.0)
Monocytes Relative: 18 %
Neutro Abs: 2 10*3/uL (ref 1.7–7.7)
Neutrophils Relative %: 44 %
Platelets: 332 10*3/uL (ref 150–400)
RBC: 4.04 MIL/uL — ABNORMAL LOW (ref 4.22–5.81)
RDW: 17.5 % — ABNORMAL HIGH (ref 11.5–15.5)
WBC: 4.5 10*3/uL (ref 4.0–10.5)
nRBC: 0 % (ref 0.0–0.2)

## 2022-06-29 MED ORDER — RIFABUTIN 150 MG PO CAPS: 300.0000 mg | ORAL_CAPSULE | Freq: Every day | ORAL | 0 refills | Status: DC

## 2022-06-29 MED ORDER — EMTRICITABINE-TENOFOVIR DF 200-300 MG PO TABS: 1.0000 | ORAL_TABLET | Freq: Every day | ORAL | 0 refills | Status: DC

## 2022-06-29 MED ORDER — ETHAMBUTOL HCL 400 MG PO TABS: 1200.0000 mg | ORAL_TABLET | Freq: Every day | ORAL | 0 refills | Status: DC

## 2022-06-29 MED ORDER — ATOVAQUONE 750 MG/5ML PO SUSP: 1500.0000 mg | Freq: Every day | ORAL | Status: DC

## 2022-06-29 MED ORDER — NAPROXEN 250 MG PO TABS: 375.0000 mg | ORAL_TABLET | Freq: Two times a day (BID) | ORAL | 0 refills | Status: DC

## 2022-06-29 MED ORDER — ORAL CARE MOUTH RINSE: 15.0000 mL | OROMUCOSAL | Status: DC | PRN

## 2022-06-29 NOTE — Progress Notes (Signed)
PROGRESS NOTE  Michael Chandler [id number] DOB: 1985-11-09   PCP: Pcp, No  Patient is from: Home.  Lives with his wife.  Independently ambulates at baseline.  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  He was started on broad-spectrum antibiotics.  ENT, IR and ID consulted.  ENT recommended IR and ID consultation and consideration of transfer to tertiary care center should surgical intervention be needed.  However, IR reviewed imaging and did not appreciate a drainable abscess. ENT reached out to [facility] head and neck surgeons who are reviewing his CT.  Per ID, concern about immune reconstitution phenomena and recommends drainage/excision, and Dr. Ihor Austin at [facility] can see him outpatient.  Currently on ethambutol, Zithromax and rifabutin, IV ceftriaxone.  ID also recommended SNF due to poor compliance secondary to mental health issues and substance use.  Unfortunately, this would be a challenge given his insurance.   In regards to his schizophrenia, psychiatry consulted and did continue home medications.    Subjective: Seen and examined earlier this morning.  No major events overnight of this morning.  Endorses 5-6 pain on a scale of 10.  This is somewhat chronic.  Also noted some drainage.  Denies shortness of breath or difficulty swallowing.  Objective: Vitals:   06/28/22 1608 06/28/22 2018 06/29/22 0456 06/29/22 0724  BP: 1'08/64 96/64 95/60 '$ (!) 99/44  Pulse: (!) 52 82 62 68  Resp: '17 18 20 15  '$ Temp: 97.6 F (36.4 C) 97.6 F (36.4 C) 97.7  F (36.5 C) 98.1 F (36.7 C)  TempSrc:  Oral    SpO2: 100% 100% 100% 100%  Weight:      Height:        Examination:  GENERAL: No apparent distress.  Nontoxic. HEENT: MMM.  Vision and hearing grossly intact.  NECK: Firm left neck mass  with opening draining some purulence.  See picture below RESP:  No IWOB.  Fair aeration bilaterally. CVS:  RRR. Heart sounds normal.  ABD/GI/GU: BS+. Abd soft, NTND.  MSK/EXT:  Moves extremities. No apparent deformity. No edema.  SKIN: no apparent skin lesion or wound NEURO: Awake, alert and oriented appropriately.  No apparent focal neuro deficit. PSYCH: Calm. Normal affect.     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  This seems to be a chronic issue.  Culture data as above.  Still with significant left neck swelling but no airway compromise. -Appreciate read by IR, ID and ENT -Per IR, no drainable abscess -Per  ENT, discussed with Dr. Ihor Austin, head and neck surgeon at [facility] who will see patient outpatient -ID recommends excision/drainage.  On ceftriaxone, rifabutin, Zithromax and ethambutol per ID. -Follow superficial culture   MAI  infection with cervical LAD -Management as above.   AIDS: CD4 count 148 (181 in 02/2022).   Viral load 181k (3123 months ago).  -ID following. -Continue Tivicay, Truvada -Antibiotics as above   Schizophrenia, undifferentiated -Psychiatry recommended continuing current meds -Continue Haldol, Cogentin, and trazodone, though compliance uncertain -Substance abuse severely complicates this issue   Amphetamine dependence: UDS persistently positive. 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 Recent Labs    04/07/22 0629 04/08/22 0349 05/10/22 2055 05/21/22 1819 06/25/22 1958 06/26/22 0326 06/27/22 0428 06/28/22 0310 06/29/22 0343  HGB 8.8* 8.9* 10.9* 9.9* 9.7* 9.0* 9.3*  9.6* 9.5* 9.9*  -Monitor intermittently . AKI: Resolved.  Noncompliance: Complicated by substance use and underlying psychiatric history.  ID suggested placement for compliance and observe with therapy but difficult due to patient's insurance status    Body mass index is 24.44 kg/m.           DVT prophylaxis:  SCDs Start: 06/25/22 2348  Code Status: Full code Family Communication: None at bedside Level of care: Telemetry Medical Status is: Inpatient Remains inpatient appropriate because: Complicated neck infection   Final disposition: TBD Consultants:  ENT IR ID  35 minutes with more than 50% spent in reviewing records, counseling patient/family and coordinating care.   Sch Meds:  Scheduled Meds:  azithromycin  500 mg Oral Daily   benztropine  1 mg Oral Daily   dolutegravir  50 mg Oral Daily   emtricitabine-tenofovir  1 tablet Oral Daily   ethambutol  1,200 mg Oral Daily   haloperidol  5 mg Oral BID   naproxen  250 mg Oral BID WC   rifabutin  300 mg Oral Daily   traZODone  150 mg Oral QHS   Continuous Infusions:  cefTRIAXone (ROCEPHIN)  IV 200 mL/hr at 06/29/22 0658    PRN Meds:.morphine injection, ondansetron **OR** ondansetron (ZOFRAN) IV, mouth rinse  Antimicrobials: Anti-infectives (From admission, onward)    Start     Dose/Rate Route Frequency Ordered Stop   06/26/22 1700  emtricitabine-tenofovir AF (DESCOVY) 200-25 MG per tablet 1 tablet  Status:  Discontinued        1 tablet Oral Daily 06/26/22 1533 06/26/22 1538   06/26/22 1630  emtricitabine-tenofovir (TRUVADA) 200-300 MG per tablet 1 tablet        1 tablet Oral Daily 06/26/22 1538     06/26/22 1630  ethambutol (MYAMBUTOL) tablet 1,200 mg        1,200 mg Oral Daily 06/26/22 1540     06/26/22 1630  rifabutin (MYCOBUTIN) capsule 300 mg        300 mg Oral Daily 06/26/22 1540     06/26/22 1600  dolutegravir (TIVICAY) tablet 50 mg        50 mg Oral Daily 06/26/22 1505     06/26/22 1400  vancomycin (VANCOCIN) IVPB 1000 mg/200 mL premix  Status:  Discontinued        1,000 mg 200 mL/hr over 60 Minutes Intravenous Every 8 hours 06/26/22 0836 06/26/22 2059   06/26/22 1000  azithromycin (ZITHROMAX) tablet 500 mg        500  mg Oral Daily 06/25/22 2351     06/26/22 0600  vancomycin (VANCOREADY) IVPB 750 mg/150 mL  Status:  Discontinued        750 mg 150 mL/hr over 60 Minutes Intravenous Every 8 hours 06/26/22 0056 06/26/22 0104   06/26/22 0600  vancomycin (VANCOREADY) IVPB 750 mg/150 mL  Status:  Discontinued        750 mg 150 mL/hr over 60 Minutes Intravenous Every 8 hours 06/26/22 0104 06/26/22 0836   06/26/22 0400  cefTRIAXone (ROCEPHIN) 2 g in sodium chloride 0.9 % 100 mL IVPB        2 g 200 mL/hr over 30 Minutes Intravenous Every 24 hours 06/25/22 2351 07/03/22 0359   06/25/22 2045  vancomycin (VANCOREADY) IVPB 1750 mg/350 mL        1,750 mg 175 mL/hr over 120 Minutes Intravenous  Once 06/25/22 2032 06/26/22 0028   06/25/22 2030  ceFEPIme (MAXIPIME) 2 g in sodium chloride 0.9 % 100 mL IVPB        2 g 200 mL/hr over 30 Minutes Intravenous  Once 06/25/22 2020 06/25/22 2119   06/25/22 2030   metroNIDAZOLE (FLAGYL) IVPB 500 mg        500 mg 100 mL/hr over 60 Minutes Intravenous  Once 06/25/22 2020 06/25/22 2217   06/25/22 2030  vancomycin (VANCOCIN) IVPB 1000 mg/200 mL premix  Status:  Discontinued        1,000 mg 200 mL/hr over 60 Minutes Intravenous  Once 06/25/22 2020 06/25/22 2032        I have personally reviewed the following labs and images: CBC: Recent Labs  Lab 06/25/22 1958 06/26/22 0326 06/27/22 0428 06/28/22 0310 06/29/22 0343  WBC 13.7* 8.2 7.2  7.2 7.2 4.5  NEUTROABS 10.4*  --  4.1  4.2 4.0 2.0  HGB 9.7* 9.0* 9.3*  9.6* 9.5* 9.9*  HCT 31.9* 28.5* 30.4*  29.3* 30.1* 31.9*  MCV 82.9 78.9* 79.2*  77* 77.8* 79.0*  PLT 263 273 306  323 294 332   BMP &GFR Recent Labs  Lab 06/25/22 1958 06/26/22 0326 06/27/22 0428 06/28/22 0310 06/29/22 0343  NA 135 136 131* 133* 135  K 3.2* 3.4* 4.0 3.9 4.0  CL 104 107 103 104 106  CO2 19* '23 25 24 24  '$ GLUCOSE 94 86 82 92 88  BUN '13 12 8 6 8  '$ CREATININE 1.73* 1.34* 1.03 0.95 0.81  CALCIUM 8.5* 8.3* 8.0* 8.1* 8.2*   Estimated Creatinine Clearance: 160.4 mL/min (by C-G formula based on SCr of 0.81 mg/dL). Liver & Pancreas: Recent Labs  Lab 06/25/22 1958  AST 34  ALT 22  ALKPHOS 97  BILITOT 0.9  PROT 8.6*  ALBUMIN 3.2*   No results for input(s): "LIPASE", "AMYLASE" in the last 168 hours. No results for input(s): "AMMONIA" in the last 168 hours. Diabetic: No results for input(s): "HGBA1C" in the last 72 hours. No results for input(s): "GLUCAP" in the last 168 hours. Cardiac Enzymes: No results for input(s): "CKTOTAL", "CKMB", "CKMBINDEX", "TROPONINI" in the last 168 hours. No results for input(s): "PROBNP" in the last 8760 hours. Coagulation Profile: Recent Labs  Lab 06/25/22 2027 06/26/22 0326  INR 1.1 1.1   Thyroid Function Tests: No results for input(s): "TSH", "T4TOTAL", "FREET4", "T3FREE", "THYROIDAB" in the last 72 hours. Lipid Profile: No results for input(s): "CHOL", "HDL",  "LDLCALC", "TRIG", "CHOLHDL", "LDLDIRECT" in the last 72 hours. Anemia Panel: No results for input(s): "VITAMINB12", "FOLATE", "FERRITIN", "TIBC", "IRON", "RETICCTPCT" in the last 72 hours.  Urine analysis:    Component Value Date/Time   COLORURINE YELLOW (A) 04/03/2022 2115   APPEARANCEUR CLEAR (A) 04/03/2022 2115   LABSPEC 1.016 04/03/2022 2115   PHURINE 7.0 04/03/2022 2115   GLUCOSEU NEGATIVE 04/03/2022 2115   HGBUR NEGATIVE 04/03/2022 2115   BILIRUBINUR NEGATIVE 04/03/2022 2115   [place] NEGATIVE 04/03/2022 2115   PROTEINUR 30 (A) 04/03/2022 2115   NITRITE NEGATIVE 04/03/2022 2115   LEUKOCYTESUR NEGATIVE 04/03/2022 2115   Sepsis Labs: Invalid input(s): "PROCALCITONIN", "LACTICIDVEN"  Microbiology: Recent Results (from the past 240 hour(s))  Culture, blood (Routine x 2)     Status: None (Preliminary result)   Collection Time: 06/25/22  8:16 PM   Specimen: BLOOD LEFT ARM  Result Value Ref Range Status   Specimen Description BLOOD LEFT ARM  Final   Special Requests   Final    BOTTLES DRAWN AEROBIC AND ANAEROBIC Blood Culture results may not be optimal due to an inadequate volume of blood received in culture bottles   Culture   Final    NO GROWTH 4 DAYS Performed at [facility] [address] [place], [place] [address]    Report Status PENDING  Incomplete  Culture, blood (Routine x 2)     Status: None (Preliminary result)   Collection Time: 06/25/22  8:25 PM   Specimen: Right Antecubital; Blood  Result Value Ref Range Status   Specimen Description RIGHT ANTECUBITAL  Final   Special Requests   Final    BOTTLES DRAWN AEROBIC AND ANAEROBIC Blood Culture adequate volume   Culture   Final    NO GROWTH 4 DAYS Performed at [facility], [address], [place], [place] [address]    Report Status PENDING  Incomplete  Resp panel by RT-PCR (RSV, Flu A&B, Covid) Anterior Nasal Swab     Status: None   Collection Time: 06/25/22  8:32 PM   Specimen: Anterior  Nasal Swab  Result Value Ref Range Status   SARS Coronavirus 2 by RT PCR NEGATIVE NEGATIVE Final    Comment: (NOTE) SARS-CoV-2 target nucleic acids are NOT DETECTED.  The SARS-CoV-2 RNA is generally detectable in upper respiratory specimens during the acute phase of infection. The lowest concentration of SARS-CoV-2 viral copies this assay can detect is 138 copies/mL. A negative result does not preclude SARS-Cov-2 infection and should not be used as the sole basis for treatment or other patient management decisions. A negative result may occur with  improper specimen collection/handling, submission of specimen other than nasopharyngeal swab, presence of viral mutation(s) within the areas targeted by this assay, and inadequate number of viral copies(<138 copies/mL). A negative result must be combined with clinical observations, patient history, and epidemiological information. The expected result is Negative.  Fact Sheet for Patients:  EntrepreneurPulse.com.au  Fact Sheet for Healthcare Providers:  IncredibleEmployment.be  This test is no t yet approved or cleared by the [place] FDA and  has been authorized for detection and/or diagnosis of SARS-CoV-2 by FDA under an Emergency Use Authorization (EUA). This EUA will remain  in effect (meaning this test can be used) for the duration of the COVID-19 declaration under Section 564(b)(1) of the Act, 21 U.S.C.section 360bbb-3(b)(1), unless the authorization is terminated  or revoked sooner.       Influenza A by PCR NEGATIVE NEGATIVE Final   Influenza B by PCR NEGATIVE NEGATIVE Final    Comment: (NOTE) The Xpert Xpress SARS-CoV-2/FLU/RSV plus assay is intended as an aid in the diagnosis of influenza from Nasopharyngeal  swab specimens and should not be used as a sole basis for treatment. Nasal washings and aspirates are unacceptable for Xpert Xpress SARS-CoV-2/FLU/RSV testing.  Fact Sheet for  Patients: EntrepreneurPulse.com.au  Fact Sheet for Healthcare Providers: IncredibleEmployment.be  This test is not yet approved or cleared by the [place] FDA and has been authorized for detection and/or diagnosis of SARS-CoV-2 by FDA under an Emergency Use Authorization (EUA). This EUA will remain in effect (meaning this test can be used) for the duration of the COVID-19 declaration under Section 564(b)(1) of the Act, 21 U.S.C. section 360bbb-3(b)(1), unless the authorization is terminated or revoked.     Resp Syncytial Virus by PCR NEGATIVE NEGATIVE Final    Comment: (NOTE) Fact Sheet for Patients: EntrepreneurPulse.com.au  Fact Sheet for Healthcare Providers: IncredibleEmployment.be  This test is not yet approved or cleared by the [place] FDA and has been authorized for detection and/or diagnosis of SARS-CoV-2 by FDA under an Emergency Use Authorization (EUA). This EUA will remain in effect (meaning this test can be used) for the duration of the COVID-19 declaration under Section 564(b)(1) of the Act, 21 U.S.C. section 360bbb-3(b)(1), unless the authorization is terminated or revoked.  Performed at [facility], [address], [place], [place] [address]   Aerobic Culture w Gram Stain (superficial specimen)     Status: None (Preliminary result)   Collection Time: 06/27/22  8:40 PM   Specimen: Wound  Result Value Ref Range Status   Specimen Description   Final    WOUND Performed at [facility], [address], [place], [place] [address]    Special Requests   Final    WOUND Performed at [facility], [address] [place], [place] [address]    Gram Stain   Final    RARE WBC PRESENT, PREDOMINANTLY MONONUCLEAR NO ORGANISMS SEEN    Culture   Final    CULTURE REINCUBATED FOR BETTER GROWTH Performed at [facility], [place] [address] [place],  [place] [address]    Report Status PENDING  Incomplete    Radiology Studies: No results found.    Martine Bleecker T. [place]  If 7PM-7AM, please contact night-coverage [email or web address] 06/29/2022, 11:43 AM

## 2022-06-29 NOTE — Progress Notes (Signed)
Date of Admission:  06/25/2022     ID: Michael Chandler is a 37 y.o. 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 Left neck pain is improving   Medications:   azithromycin  500 mg Oral Daily   benztropine  1 mg Oral Daily   dolutegravir  50 mg Oral Daily   emtricitabine-tenofovir  1 tablet Oral Daily   ethambutol  1,200 mg Oral Daily   haloperidol  5 mg Oral BID   naproxen  250 mg Oral BID WC   rifabutin  300 mg Oral Daily   traZODone  150 mg Oral QHS    Objective: Vital signs in last 24 hours: Patient Vitals for the past 24 hrs:  BP Temp Temp src Pulse Resp SpO2  06/29/22 0724 (!) 99/44 98.1 F (36.7 C) -- 68 15 100 %  06/29/22 0456 95/60 97.7 F (36.5 C) -- 62 20 100 %  06/28/22 2018 96/64 97.6 F (36.4 C) Oral 82 18 100 %  06/28/22 1608 108/64 97.6 F (36.4 C) -- (!) 52 17 100 %      PHYSICAL EXAM:  General: More awake and alert,  Neck: left sided soft swelling- conglomeration of cervical lymphnodes Sinu- draining pus   Back: No CVA tenderness. Lungs: Clear to auscultation bilaterally. No Wheezing or Rhonchi. No rales. Heart: Regular rate and rhythm, no murmur, rub or gallop. Abdomen: Soft, non-tender,not distended. Bowel sounds normal. No masses Extremities: atraumatic, no cyanosis. No edema. No clubbing Skin: No rashes or lesions. Or bruising Lymph: Cervical, supraclavicular normal. Neurologic: Grossly non-focal  Lab Results Recent Labs    06/28/22 0310 06/29/22 0343  WBC 7.2 4.5  HGB 9.5* 9.9*  HCT 30.1* 31.9*  NA  133* 135  K 3.9 4.0  CL 104 106  CO2 24 24  BUN 6 8  CREATININE 0.95 0.81    Microbiology: BC - Neg WC NG   Assessment/Plan: 37 yr male presenting with feeling of squirrel running all over him and hallucinating Related to methamphetamine use.  Urine tox screen positive for the same. He has underlying schizophrenia   Mycobacterium avium intracellulare infection of cervical lymph nodes- This is an immune reconstitiuion phenomenon. Blood culture in Nov was negative for AFB Pt last filled prescriptions on 12/16.  He did not keep his appointments with me in January.  So he is noncompliant with medication as well as visits He is supposed to be on Ethambutol '1200mg'$ , Azithromycin '500mg'$  and rifabutin '300mg'$  once a day The lymphadenopathy is extensive and there is sinus formation- Unlikely this will resolve just with anti MAC rx- will need drainage/excision. Seen by ENT who wants him referred to tertiary center because of complexity of his illness.  Dr. Ihor Austin at [facility] can see him as outpatient. Added NSAID to reduce the inflammation- will not do steroids now as he will be at risk for other OI with steroids   AIDS- last VL 4180 and cd4 was 181 on 03/18/22.  Now the CD4 is 148 ( 8%) and viral load was 180,000 from 06/27/2022 . so he is clearly not taking  his HIV medicines.  truvada and tivicay.  I shared the results with him and told him about his noncompliance. 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 Anemia?   AKI resolved   Discussed with him in detail the seriousness of his illness,and his poor prognosis as he continues to be noncompliant with treatment  Around 5 PM the patient informed the nurse that he wanted to talk to me and he told me that he was leaving and if he did not get   prescription he may leave AMA..  I informed hospitalist and his nurse and patient was discharged with 1 month supply of medications He has a follow-up appointment with me in 2 weeks.

## 2022-06-29 NOTE — Progress Notes (Signed)
OT Cancellation Note  Patient Details Name: Michael Chandler MRN: [id number] DOB: [birth year]   Cancelled Treatment:    Reason Eval/Treat Not Completed: OT screened, no needs identified, will sign off. OT order received and chart reviewed. Per PT, pt is Ind in all aspects of care with no skilled therapy need. OT to complete orders.   Darleen Crocker, MS, OTR/L , CBIS ascom [phone]  06/29/22, 9:48 AM

## 2022-06-29 NOTE — Plan of Care (Signed)

## 2022-06-29 NOTE — Discharge Summary (Signed)
Physician Discharge Summary  Michael Chandler [id number] DOB: 1985-09-18 DOA: 06/25/2022  PCP: Pcp, No  Admit date: 06/25/2022 Discharge date: 06/29/2022 Admitted From: Home Disposition: Home Recommendations for Outpatient Follow-up:  Follow up as below. Assess compliance, CMP and CBC at follow-up Please follow up on the following pending results: Superficial wound culture  Home Health: Not indicated Equipment/Devices: Not indicated  Discharge Condition: Stable CODE STATUS: Full code  Follow-up Information     Tsosie Billing, MD. Schedule an appointment as soon as possible for a visit in 1 week(s).   Specialty: Infectious Diseases Contact information: [place] [address] [phone]         Lennox Grumbles, MD. Schedule an appointment as soon as possible for a visit in 1 week(s).   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  He was started on broad-spectrum antibiotics.  ENT, IR and ID consulted.   ENT recommended IR and ID consultation and consideration of transfer to tertiary care center should surgical intervention be needed.  However, IR reviewed imaging and did not appreciate a drainable abscess. ENT reached out to [facility] head and neck surgeons who are reviewing his CT.  Per ID, concern about immune reconstitution phenomena and recommends drainage/excision, and Dr. Ihor Austin at [facility] can see him outpatient.  Currently on ethambutol, Zithromax and rifabutin, IV ceftriaxone.  ID also recommended SNF due to poor  compliance secondary to mental health issues and substance use.  Unfortunately, this would be a challenge given his insurance.    In regards to his schizophrenia, psychiatry consulted and did continue home medications.  On the day of discharge, it was brought to my attention by ID that "patient wants to leave now and he says he is uncomfortable staying here- He is medically as stable as he can be . I told him to wait for his medicatication adherence packs to be delivered but he does not want to.  ID recommended discharge on Ethambutol '1200mg'$  Qd, Azithromycin '500mg'$  QD, Rifabutin '300mg'$  QD, Tivicay '50mg'$  QD, Descovy 1 QD , Atovaquone liquid '1500mg'$  QD, Naprosyn 375 mg BID.   Outpatient follow-up with ID and ENT as above.  See individual problem list below for more.   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  This seems to be a chronic issue.  Culture data as above.  Still with significant left neck swelling but no airway compromise or dysphagia.. -Appreciate read by IR, ID and ENT -Per IR, no drainable abscess -Per ENT, discussed with Dr. Ihor Austin, head and neck surgeon at [facility] who will see patient outpatient -  Discharge medications as above -Follow superficial culture -Outpatient follow-up with ID and ENT as above   MAI  infection with cervical LAD -Management as above.   AIDS: CD4 count 148 (181 in 02/2022).  Viral  load 181k (3123 months ago).  -Discharge medications as above   Schizophrenia, undifferentiated -Psychiatry recommended continuing home meds. -Substance abuse severely complicates this issue   Amphetamine dependence: UDS persistently positive. 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 -Check CBC with differential at follow-up . AKI: Resolved.   Noncompliance: Complicated by substance use and underlying psychiatric history.  ID suggested placement for compliance and observe with therapy but difficult due to patient's insurance status           Vital signs Vitals:   06/28/22 1608 06/28/22 2018 06/29/22 0456 06/29/22 0724  BP: 1'08/64 96/64 95/60 '$ (!) 99/44  Pulse: (!) 52 82 62 68  Temp: 97.6 F (36.4 C) 97.6 F (36.4 C) 97.7 F (36.5 C) 98.1 F (36.7 C)  Resp: '17 18 20 15  '$ Height:      Weight:      SpO2: 100% 100% 100% 100%  TempSrc:  Oral    BMI (Calculated):         Discharge exam  GENERAL: No apparent distress.  Nontoxic. HEENT: MMM.  Vision and hearing grossly intact.  NECK: Firm left neck mass  with opening draining some purulence.  See picture under media. RESP:  No IWOB.  Fair aeration bilaterally. CVS:  RRR. Heart sounds normal.  ABD/GI/GU: BS+. Abd soft, NTND.  MSK/EXT:  Moves extremities. No apparent deformity. No edema.  SKIN: no apparent skin lesion or wound NEURO: Awake and alert. Oriented appropriately.  No apparent focal neuro deficit. PSYCH: Calm. Normal affect.   Discharge Instructions Discharge Instructions     Diet - low sodium heart healthy   Complete by: As directed    Discharge instructions   Complete by: As directed    It has been a pleasure taking care of you!  You were hospitalized due to neck infection for which you have been  started on medications.  It is very important that you take your medications as prescribed.  Please review your new medication list and the directions on your medications before you take them.  Please call infectious disease doctor's office if you have problems filling your prescriptions or other questions about your medications.   Take care,   Increase activity slowly   Complete by: As directed    No wound care   Complete by: As directed       Allergies as of 06/29/2022       Reactions   Bactrim [sulfamethoxazole-trimethoprim] Anaphylaxis        Medication List     TAKE these medications    azithromycin 500 MG tablet Commonly known as: ZITHROMAX Take 500 mg by mouth daily.   benztropine 1 MG tablet Commonly known as: COGENTIN Take 1 tablet (1 mg total) by mouth daily.   dolutegravir 50 MG tablet Commonly known as: TIVICAY Take 1 tablet (50 mg total) by mouth daily.   emtricitabine-tenofovir 200-300 MG tablet Commonly known as: TRUVADA Take 1 tablet by mouth daily. Start taking on: June 30, 2022   ethambutol 400 MG tablet Commonly known as: MYAMBUTOL Take 3 tablets (1,200 mg total) by  mouth daily. Start taking on: June 30, 2022   gabapentin 300 MG capsule Commonly known as: Neurontin Take 1 capsule (300 mg total) by mouth 3 (three) times daily.   haloperidol 5 MG tablet Commonly known as: HALDOL Take 1 tablet (5 mg total) by mouth 2 (two) times daily.   naproxen 250 MG tablet Commonly known as: NAPROSYN Take 1.5 tablets (375 mg total) by mouth 2 (two) times daily with a meal. Start taking on: June 30, 2022   rifabutin 150 MG capsule Commonly known as: MYCOBUTIN Take 2 capsules (300 mg total) by mouth daily. Start taking on: June 30, 2022   traZODone 150 MG tablet Commonly known as: DESYREL Take 1 tablet (150 mg total) by mouth at bedtime.        Consultations: Infectious disease Psychiatry ENT IR  Procedures/Studies:   CT Soft Tissue Neck  W Contrast  Result Date: 06/25/2022 CLINICAL DATA:  Initial evaluation for soft tissue infection. EXAM: CT NECK WITH CONTRAST TECHNIQUE: Multidetector CT imaging of the neck was performed using the standard protocol following the bolus administration of intravenous contrast. RADIATION DOSE REDUCTION: This exam was performed according to the departmental dose-optimization program which includes automated exposure control, adjustment of the mA and/or kV according to patient size and/or use of iterative reconstruction technique. CONTRAST:  31m OMNIPAQUE IOHEXOL 300 MG/ML  SOLN COMPARISON:  Prior study from 03/15/2022. FINDINGS: Pharynx and larynx: Oral cavity within normal limits. Oropharynx and nasopharynx within normal limits. No retropharyngeal collection. Epiglottis within normal limits. Hypopharynx and supraglottic larynx within normal limits. Negative glottis. Subglottic airway patent clear. Salivary glands: Parotid glands and right submandibular gland are normal. Inflammatory changes partially surround the left submandibular gland related to the acute inflammatory process within the left neck. Gland itself felt to be within normal limits. Thyroid: Normal. Lymph nodes: Multiple enlarged lymph nodes and/or nodal conglomerate seen extending along the left cervical chain. For reference purposes, the largest node measures up to approximately 2.8 cm in short axis at level 2 (series 2, image 49). Few scattered areas of internal hypodensity consistent with internal suppuration and/or necrosis. Extensive overlying soft tissue swelling with inflammatory stranding within the adjacent anterolateral left neck. The left sternocleidomastoid muscle is thickened and edematous. Superimposed hypodense collection at the posterior margin of the left SCM at the left lateral neck measures 1.9 x 1.4 x 2.0 cm, consistent with abscess (series 2, image 61). Additional collection located superiorly within the left posterior paraspinous  musculature measures 2.0 x 1.3 x 3.3 cm (series 2, image 42). An additional probable collection positioned along the deep aspect of the left sternocleidomastoid muscle measures approximately 2.5 x 1.5 x 1.0 cm (series 2, image 65). Few additional scattered subcentimeter hypodensities noted within this region, likely small micro abscesses. No pathologic adenopathy seen within the contralateral right neck. Vascular: Normal intravascular enhancement seen throughout the neck. Left IJ is partially compressed by the left-sided adenopathy. Limited intracranial: Unremarkable. Visualized orbits: Unremarkable. Mastoids and visualized paranasal sinuses: Mild mucosal thickening present about the left maxillary sinus. Visualized paranasal sinuses are otherwise largely clear. Trace left mastoid effusion noted. Middle ear cavities are clear. Skeleton: No discrete or worrisome osseous lesions. Prior fusion at C4-C7 with C5 and C6 corpectomy. Prior posterior decompression with fusion at C3 through T1. No visible hardware complication. Upper chest: Visualized upper chest demonstrates no acute finding. Small bulla noted at the medial left upper lobe. Other: None. IMPRESSION: 1. Extensive soft tissue swelling with inflammatory stranding throughout the anterolateral left  neck, concerning for acute soft tissue infection/cellulitis. Multiple superimposed hypodense collections within this region measure up to 3.3 cm, concerning for abscesses. 2. Superimposed extensive left-sided cervical adenopathy. Internal hypodensity within a few of these nodes consistent with suppuration and/or necrosis. While these findings are favored to reflect changes of infection, clinical follow-up to resolution recommended and to ensure no underlying neoplastic process is present. Correlation with dedicated nodal histologic sampling could be performed for further evaluation as warranted. 3. Prior fusion at C4-C7 with C5 and C6 corpectomy. Prior posterior  decompression with fusion at C3 through T1. No visible hardware complication. Electronically Signed   By: Jeannine Boga M.D.   On: 06/25/2022 21:28   DG Chest Port 1 View  Result Date: 06/25/2022 CLINICAL DATA:  Suspected sepsis EXAM: PORTABLE CHEST 1 VIEW COMPARISON:  04/03/2022 FINDINGS: Cardiac shadow is stable. The lungs are well aerated bilaterally without focal infiltrate or effusion. No acute bony abnormality is noted. IMPRESSION: No active disease. Electronically Signed   By: Inez Catalina M.D.   On: 06/25/2022 20:30       The results of significant diagnostics from this hospitalization (including imaging, microbiology, ancillary and laboratory) are listed below for reference.     Microbiology: Recent Results (from the past 240 hour(s))  Culture, blood (Routine x 2)     Status: None (Preliminary result)   Collection Time: 06/25/22  8:16 PM   Specimen: BLOOD LEFT ARM  Result Value Ref Range Status   Specimen Description BLOOD LEFT ARM  Final   Special Requests   Final    BOTTLES DRAWN AEROBIC AND ANAEROBIC Blood Culture results may not be optimal due to an inadequate volume of blood received in culture bottles   Culture   Final    NO GROWTH 4 DAYS Performed at [facility], [address], [place], [place] [address]    Report Status PENDING  Incomplete  Culture, blood (Routine x 2)     Status: None (Preliminary result)   Collection Time: 06/25/22  8:25 PM   Specimen: Right Antecubital; Blood  Result Value Ref Range Status   Specimen Description RIGHT ANTECUBITAL  Final   Special Requests   Final    BOTTLES DRAWN AEROBIC AND ANAEROBIC Blood Culture adequate volume   Culture   Final    NO GROWTH 4 DAYS Performed at [facility], [address] [place], [place] [address]    Report Status PENDING  Incomplete  Resp panel by RT-PCR (RSV, Flu A&B, Covid) Anterior Nasal Swab     Status: None   Collection Time: 06/25/22  8:32 PM   Specimen: Anterior  Nasal Swab  Result Value Ref Range Status   SARS Coronavirus 2 by RT PCR NEGATIVE NEGATIVE Final    Comment: (NOTE) SARS-CoV-2 target nucleic acids are NOT DETECTED.  The SARS-CoV-2 RNA is generally detectable in upper respiratory specimens during the acute phase of infection. The lowest concentration of SARS-CoV-2 viral copies this assay can detect is 138 copies/mL. A negative result does not preclude SARS-Cov-2 infection and should not be used as the sole basis for treatment or other patient management decisions. A negative result may occur with  improper specimen collection/handling, submission of specimen other than nasopharyngeal swab, presence of viral mutation(s) within the areas targeted by this assay, and inadequate number of viral copies(<138 copies/mL). A negative result must be combined with clinical observations, patient history, and epidemiological information. The expected result is Negative.  Fact Sheet for Patients:  EntrepreneurPulse.com.au  Fact Sheet for Healthcare Providers:  IncredibleEmployment.be  This test is no t yet approved or cleared by the [place] FDA and  has been authorized for detection and/or diagnosis of SARS-CoV-2 by FDA under an Emergency Use Authorization (EUA). This EUA will remain  in effect (meaning this test can be used) for the duration of the COVID-19 declaration under Section 564(b)(1) of the Act, 21 U.S.C.section 360bbb-3(b)(1), unless the authorization is terminated  or revoked sooner.       Influenza A by PCR NEGATIVE NEGATIVE Final   Influenza B by PCR NEGATIVE NEGATIVE Final    Comment: (NOTE) The Xpert Xpress SARS-CoV-2/FLU/RSV plus assay is intended as an aid in the diagnosis of influenza from Nasopharyngeal swab specimens and should not be used as a sole basis for treatment. Nasal washings and aspirates are unacceptable for Xpert Xpress SARS-CoV-2/FLU/RSV testing.  Fact Sheet for  Patients: EntrepreneurPulse.com.au  Fact Sheet for Healthcare Providers: IncredibleEmployment.be  This test is not yet approved or cleared by the [place] FDA and has been authorized for detection and/or diagnosis of SARS-CoV-2 by FDA under an Emergency Use Authorization (EUA). This EUA will remain in effect (meaning this test can be used) for the duration of the COVID-19 declaration under Section 564(b)(1) of the Act, 21 U.S.C. section 360bbb-3(b)(1), unless the authorization is terminated or revoked.     Resp Syncytial Virus by PCR NEGATIVE NEGATIVE Final    Comment: (NOTE) Fact Sheet for Patients: EntrepreneurPulse.com.au  Fact Sheet for Healthcare Providers: IncredibleEmployment.be  This test is not yet approved or cleared by the [place] FDA and has been authorized for detection and/or diagnosis of SARS-CoV-2 by FDA under an Emergency Use Authorization (EUA). This EUA will remain in effect (meaning this test can be used) for the duration of the COVID-19 declaration under Section 564(b)(1) of the Act, 21 U.S.C. section 360bbb-3(b)(1), unless the authorization is terminated or revoked.  Performed at [facility], [address] [place], [place] [address]   Aerobic Culture w Gram Stain (superficial specimen)     Status: None (Preliminary result)   Collection Time: 06/27/22  8:40 PM   Specimen: Wound  Result Value Ref Range Status   Specimen Description   Final    WOUND Performed at [facility], [address], [place], [place] [address]    Special Requests   Final    WOUND Performed at [facility], [address] [place], [place] [address]    Gram Stain   Final    RARE WBC PRESENT, PREDOMINANTLY MONONUCLEAR NO ORGANISMS SEEN    Culture   Final    CULTURE REINCUBATED FOR BETTER GROWTH Performed at [facility], [place] [address], [place],  [place] [address]    Report Status PENDING  Incomplete     Labs:  CBC: Recent Labs  Lab 06/25/22 1958 06/26/22 0326 06/27/22 0428 06/28/22 0310 06/29/22 0343  WBC 13.7* 8.2 7.2  7.2 7.2 4.5  NEUTROABS 10.4*  --  4.1  4.2 4.0 2.0  HGB 9.7* 9.0* 9.3*  9.6* 9.5* 9.9*  HCT 31.9* 28.5* 30.4*  29.3* 30.1* 31.9*  MCV 82.9 78.9* 79.2*  77* 77.8* 79.0*  PLT 263 273 306  323 294 332   BMP &GFR Recent Labs  Lab 06/25/22 1958 06/26/22 0326 06/27/22 0428 06/28/22 0310 06/29/22 0343  NA 135 136 131* 133* 135  K 3.2* 3.4* 4.0 3.9 4.0  CL 104 107 103 104 106  CO2 19* 23 25 24  24  GLUCOSE 94 86 82 92 88  BUN '13 12 8 6 8  '$ CREATININE 1.73* 1.34* 1.03 0.95 0.81  CALCIUM 8.5* 8.3* 8.0* 8.1* 8.2*   Estimated Creatinine Clearance: 160.4 mL/min (by C-G formula based on SCr of 0.81 mg/dL). Liver & Pancreas: Recent Labs  Lab 06/25/22 1958  AST 34  ALT 22  ALKPHOS 97  BILITOT 0.9  PROT 8.6*  ALBUMIN 3.2*   No results for input(s): "LIPASE", "AMYLASE" in the last 168 hours. No results for input(s): "AMMONIA" in the last 168 hours. Diabetic: No results for input(s): "HGBA1C" in the last 72 hours. No results for input(s): "GLUCAP" in the last 168 hours. Cardiac Enzymes: No results for input(s): "CKTOTAL", "CKMB", "CKMBINDEX", "TROPONINI" in the last 168 hours. No results for input(s): "PROBNP" in the last 8760 hours. Coagulation Profile: Recent Labs  Lab 06/25/22 2027 06/26/22 0326  INR 1.1 1.1   Thyroid Function Tests: No results for input(s): "TSH", "T4TOTAL", "FREET4", "T3FREE", "THYROIDAB" in the last 72 hours. Lipid Profile: No results for input(s): "CHOL", "HDL", "LDLCALC", "TRIG", "CHOLHDL", "LDLDIRECT" in the last 72 hours. Anemia Panel: No results for input(s): "VITAMINB12", "FOLATE", "FERRITIN", "TIBC", "IRON", "RETICCTPCT" in the last 72 hours. Urine analysis:    Component Value Date/Time   COLORURINE YELLOW (A) 04/03/2022 2115   APPEARANCEUR CLEAR (A) 04/03/2022  2115   LABSPEC 1.016 04/03/2022 2115   PHURINE 7.0 04/03/2022 2115   GLUCOSEU NEGATIVE 04/03/2022 2115   HGBUR NEGATIVE 04/03/2022 2115   BILIRUBINUR NEGATIVE 04/03/2022 2115   [place] NEGATIVE 04/03/2022 2115   PROTEINUR 30 (A) 04/03/2022 2115   NITRITE NEGATIVE 04/03/2022 2115   LEUKOCYTESUR NEGATIVE 04/03/2022 2115   Sepsis Labs: Invalid input(s): "PROCALCITONIN", "LACTICIDVEN"   SIGNED:  Mercy Riding, MD  [facility] 06/29/2022, 6:56 PM

## 2022-06-29 NOTE — Evaluation (Signed)
Physical Therapy Evaluation Patient Details Name: Michael Chandler MRN: [id number] DOB: Jul 20, 1985 Today's Date: 06/29/2022  History of Present Illness  Michael Chandler is a 37 y.o. 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   Clinical Impression  Patient is received in bed, he is pleasant and agrees to PT assessment. Patient is independent with all mobility. Donned socks independently prior to ambulation. No balance issues noted. He does not require continued skilled PT at this time. Signing off.          Recommendations for follow up therapy are one component of a multi-disciplinary discharge planning process, led by the attending physician.  Recommendations may be updated based on patient status, additional functional criteria and insurance authorization.  Follow Up Recommendations No PT follow up      Assistance Recommended at Discharge None  Patient can return home with the following       Equipment Recommendations    Recommendations for Other Services       Functional Status Assessment Patient has not had a recent decline in their functional status     Precautions / Restrictions Precautions Precautions: None Restrictions Weight Bearing Restrictions: No      Mobility  Bed Mobility Overal bed mobility: Independent                  Transfers Overall transfer level: Independent                      Ambulation/Gait Ambulation/Gait assistance:  Independent Gait Distance (Feet): 30 Feet Assistive device: None Gait Pattern/deviations: WFL(Within Functional Limits)          Stairs            Wheelchair Mobility    Modified Rankin (Stroke Patients Only)       Balance Overall balance assessment: Independent                                           Pertinent Vitals/Pain Pain Assessment Pain Assessment: Faces Faces Pain Scale: Hurts a little bit Pain Location: neck Pain Descriptors / Indicators: Discomfort, Sore Pain Intervention(s): Monitored during session    Home Living Family/patient expects to be discharged to:: Private residence Living Arrangements: Parent Available Help at Discharge: Family;Available 24 hours/day Type of Home: House           Home Equipment: None      Prior Function Prior Level of Function : Independent/Modified Independent;Driving             Mobility Comments: independent. disabled- does not work. ADLs Comments: independent     Hand Dominance        Extremity/Trunk Assessment   Upper Extremity Assessment Upper Extremity Assessment: Overall WFL for tasks assessed    Lower Extremity Assessment Lower Extremity Assessment: Overall WFL for tasks assessed    Cervical / Trunk Assessment Cervical / Trunk Assessment: Normal (prior neck surgery and  swelling in neck currently from infection)  Communication   Communication: No difficulties  Cognition Arousal/Alertness: Awake/alert Behavior During Therapy: WFL for tasks assessed/performed Overall Cognitive Status: Within Functional Limits for tasks assessed                                 General Comments: very pleasant        General Comments      Exercises     Assessment/Plan    PT Assessment Patient does not need any further PT services  PT Problem List         PT Treatment Interventions      PT Goals (Current goals can be found in the Care Plan section)  Acute  Rehab PT Goals Patient Stated Goal: get better, go home PT Goal Formulation: With patient Time For Goal Achievement: 07/06/22 Potential to Achieve Goals: Good    Frequency       Co-evaluation               AM-PAC PT "6 Clicks" Mobility  Outcome Measure Help needed turning from your back to your side while in a flat bed without using bedrails?: None Help needed moving from lying on your back to sitting on the side of a flat bed without using bedrails?: None Help needed moving to and from a bed to a chair (including a wheelchair)?: None Help needed standing up from a chair using your arms (e.g., wheelchair or bedside chair)?: None Help needed to walk in hospital room?: None Help needed climbing 3-5 steps with a railing? : None 6 Click Score: 24    End of Session   Activity Tolerance: Patient tolerated treatment well Patient left: in bed;with call bell/phone within reach;with bed alarm set Nurse Communication: Mobility status      Time: [id number] PT Time Calculation (min) (ACUTE ONLY): 8 min   Charges:   PT Evaluation $PT Eval Low Complexity: 1 Low          Darline Faith, PT, GCS 06/29/22,9:14 AM

## 2022-06-30 ENCOUNTER — Other Ambulatory Visit (HOSPITAL_COMMUNITY): Payer: Self-pay

## 2022-06-30 ENCOUNTER — Other Ambulatory Visit: Payer: Self-pay | Admitting: Infectious Diseases

## 2022-06-30 ENCOUNTER — Other Ambulatory Visit: Payer: Self-pay

## 2022-06-30 LAB — AEROBIC CULTURE W GRAM STAIN (SUPERFICIAL SPECIMEN)

## 2022-06-30 LAB — CULTURE, BLOOD (ROUTINE X 2)
Culture: NO GROWTH
Culture: NO GROWTH
Special Requests: ADEQUATE

## 2022-06-30 MED ORDER — AZITHROMYCIN 500 MG PO TABS
500.0000 mg | ORAL_TABLET | Freq: Every day | ORAL | 3 refills | Status: DC
  Filled 2022-06-30: qty 30, 30d supply, fill #0
  Filled 2022-07-28: qty 30, 30d supply, fill #1

## 2022-06-30 MED ORDER — ETHAMBUTOL HCL 400 MG PO TABS
1200.0000 mg | ORAL_TABLET | Freq: Every day | ORAL | 3 refills | Status: DC
  Filled 2022-06-30: qty 90, 30d supply, fill #0
  Filled 2022-07-28: qty 90, 30d supply, fill #1

## 2022-06-30 MED ORDER — TIVICAY 50 MG PO TABS
50.0000 mg | ORAL_TABLET | Freq: Every day | ORAL | 3 refills | Status: DC
  Filled 2022-06-30: qty 30, 30d supply, fill #0
  Filled 2022-07-28: qty 30, 30d supply, fill #1

## 2022-06-30 MED ORDER — NAPROXEN 250 MG PO TABS
250.0000 mg | ORAL_TABLET | Freq: Two times a day (BID) | ORAL | 0 refills | Status: DC
  Filled 2022-06-30 – 2022-07-03 (×3): qty 60, 30d supply, fill #0

## 2022-06-30 MED ORDER — RIFABUTIN 150 MG PO CAPS
300.0000 mg | ORAL_CAPSULE | Freq: Every day | ORAL | 3 refills | Status: DC
  Filled 2022-06-30: qty 60, 30d supply, fill #0
  Filled 2022-07-28: qty 60, 30d supply, fill #1

## 2022-06-30 MED ORDER — ATOVAQUONE 750 MG/5ML PO SUSP
1500.0000 mg | Freq: Every day | ORAL | 3 refills | Status: DC
  Filled 2022-06-30 – 2022-07-03 (×3): qty 300, 30d supply, fill #0
  Filled 2022-07-28: qty 300, 30d supply, fill #1

## 2022-06-30 MED ORDER — DESCOVY 200-25 MG PO TABS
1.0000 | ORAL_TABLET | Freq: Every day | ORAL | 3 refills | Status: DC
  Filled 2022-06-30: qty 30, 30d supply, fill #0
  Filled 2022-07-28: qty 30, 30d supply, fill #1

## 2022-06-30 NOTE — Progress Notes (Signed)
Sent all his prescriptions -to [facility] for adherence packing  Azithromycin '500mg'$  qd Ethambutol '1200mg'$  qd Rifabutin '300mg'$  qd Descovy 1 table ( TAF25/FTC 200) qd Tivicay ( dolutegravir) '50mg'$  qd Atovaquone '1500mg'$  QD Naprosyn '250mg'$  BID

## 2022-07-03 ENCOUNTER — Emergency Department
Admission: EM | Admit: 2022-07-03 | Discharge: 2022-07-04 | Disposition: A | Discharge status: Home / Self Care | Payer: No Typology Code available for payment source | Attending: Emergency Medicine | Admitting: Emergency Medicine

## 2022-07-03 ENCOUNTER — Other Ambulatory Visit: Payer: Self-pay

## 2022-07-03 ENCOUNTER — Encounter: Payer: Self-pay | Admitting: Emergency Medicine

## 2022-07-03 ENCOUNTER — Other Ambulatory Visit (HOSPITAL_COMMUNITY): Payer: Self-pay

## 2022-07-03 ENCOUNTER — Emergency Department: Payer: No Typology Code available for payment source

## 2022-07-03 DIAGNOSIS — M542 Cervicalgia: Secondary | ICD-10-CM | POA: Diagnosis not present

## 2022-07-03 DIAGNOSIS — R590 Localized enlarged lymph nodes: Secondary | ICD-10-CM | POA: Diagnosis not present

## 2022-07-03 DIAGNOSIS — A310 Pulmonary mycobacterial infection: Secondary | ICD-10-CM

## 2022-07-03 DIAGNOSIS — I959 Hypotension, unspecified: Secondary | ICD-10-CM | POA: Diagnosis not present

## 2022-07-03 DIAGNOSIS — F69 Unspecified disorder of adult personality and behavior: Secondary | ICD-10-CM | POA: Diagnosis not present

## 2022-07-03 DIAGNOSIS — R221 Localized swelling, mass and lump, neck: Secondary | ICD-10-CM | POA: Diagnosis present

## 2022-07-03 DIAGNOSIS — Z87891 Personal history of nicotine dependence: Secondary | ICD-10-CM | POA: Insufficient documentation

## 2022-07-03 DIAGNOSIS — L03221 Cellulitis of neck: Secondary | ICD-10-CM | POA: Insufficient documentation

## 2022-07-03 DIAGNOSIS — F203 Undifferentiated schizophrenia: Secondary | ICD-10-CM | POA: Insufficient documentation

## 2022-07-03 DIAGNOSIS — R609 Edema, unspecified: Secondary | ICD-10-CM | POA: Diagnosis not present

## 2022-07-03 DIAGNOSIS — Z21 Asymptomatic human immunodeficiency virus [HIV] infection status: Secondary | ICD-10-CM | POA: Diagnosis not present

## 2022-07-03 DIAGNOSIS — R000 Tachycardia, unspecified: Secondary | ICD-10-CM | POA: Diagnosis not present

## 2022-07-03 DIAGNOSIS — F209 Schizophrenia, unspecified: Secondary | ICD-10-CM | POA: Diagnosis not present

## 2022-07-03 DIAGNOSIS — F29 Unspecified psychosis not due to a substance or known physiological condition: Secondary | ICD-10-CM | POA: Diagnosis not present

## 2022-07-03 LAB — ACETAMINOPHEN LEVEL: Acetaminophen (Tylenol), Serum: <10 ug/mL — ABNORMAL LOW (ref 10–30)

## 2022-07-03 LAB — CBC WITH DIFFERENTIAL/PLATELET
Abs Immature Granulocytes: 0.03 10*3/uL (ref 0.00–0.07)
Basophils Absolute: 0 10*3/uL (ref 0.0–0.1)
Basophils Relative: 0 %
Eosinophils Absolute: 0.1 10*3/uL (ref 0.0–0.5)
Eosinophils Relative: 1 %
HCT: 33.3 % — ABNORMAL LOW (ref 39.0–52.0)
Hemoglobin: 10.4 g/dL — ABNORMAL LOW (ref 13.0–17.0)
Immature Granulocytes: 0 %
Lymphocytes Relative: 34 %
Lymphs Abs: 3.3 10*3/uL (ref 0.7–4.0)
MCH: 24.8 pg — ABNORMAL LOW (ref 26.0–34.0)
MCHC: 31.2 g/dL (ref 30.0–36.0)
MCV: 79.5 fL — ABNORMAL LOW (ref 80.0–100.0)
Monocytes Absolute: 1.7 10*3/uL — ABNORMAL HIGH (ref 0.1–1.0)
Monocytes Relative: 17 %
Neutro Abs: 4.5 10*3/uL (ref 1.7–7.7)
Neutrophils Relative %: 48 %
Platelets: 494 10*3/uL — ABNORMAL HIGH (ref 150–400)
RBC: 4.19 MIL/uL — ABNORMAL LOW (ref 4.22–5.81)
RDW: 17.8 % — ABNORMAL HIGH (ref 11.5–15.5)
WBC: 9.6 10*3/uL (ref 4.0–10.5)
nRBC: 0 % (ref 0.0–0.2)

## 2022-07-03 LAB — COMPREHENSIVE METABOLIC PANEL
ALT: 15 U/L (ref 0–44)
AST: 22 U/L (ref 15–41)
Albumin: 3.5 g/dL (ref 3.5–5.0)
Alkaline Phosphatase: 126 U/L (ref 38–126)
Anion gap: 4 — ABNORMAL LOW (ref 5–15)
BUN: 15 mg/dL (ref 6–20)
CO2: 29 mmol/L (ref 22–32)
Calcium: 8.6 mg/dL — ABNORMAL LOW (ref 8.9–10.3)
Chloride: 103 mmol/L (ref 98–111)
Creatinine, Ser: 1.47 mg/dL — ABNORMAL HIGH (ref 0.61–1.24)
GFR, Estimated: >60 mL/min (ref >60)
Glucose, Bld: 98 mg/dL (ref 70–99)
Potassium: 3.7 mmol/L (ref 3.5–5.1)
Sodium: 136 mmol/L (ref 135–145)
Total Bilirubin: 0.4 mg/dL (ref 0.3–1.2)
Total Protein: 9.2 g/dL — ABNORMAL HIGH (ref 6.5–8.1)

## 2022-07-03 LAB — SALICYLATE LEVEL: Salicylate Lvl: <7 mg/dL — ABNORMAL LOW (ref 7.0–30.0)

## 2022-07-03 LAB — ETHANOL: Alcohol, Ethyl (B): <10 mg/dL (ref <10)

## 2022-07-03 MED ORDER — ATOVAQUONE 750 MG/5ML PO SUSP
1500.0000 mg | Freq: Every day | ORAL | Status: DC
  Administered 2022-07-04: 1500 mg via ORAL
  Filled 2022-07-03: qty 10

## 2022-07-03 MED ORDER — DROPERIDOL 2.5 MG/ML IJ SOLN: 5.0000 mg | Freq: Once | INTRAMUSCULAR | Status: DC

## 2022-07-03 MED ORDER — ETHAMBUTOL HCL 400 MG PO TABS
1200.0000 mg | ORAL_TABLET | Freq: Every day | ORAL | Status: DC
  Administered 2022-07-04: 1200 mg via ORAL
  Filled 2022-07-03: qty 3

## 2022-07-03 MED ORDER — AZITHROMYCIN 500 MG PO TABS
500.0000 mg | ORAL_TABLET | Freq: Every day | ORAL | Status: DC
  Administered 2022-07-04: 500 mg via ORAL
  Filled 2022-07-03: qty 1

## 2022-07-03 MED ORDER — SODIUM CHLORIDE 0.9 % IV SOLN
2.0000 g | Freq: Once | INTRAVENOUS | Status: AC
  Administered 2022-07-03: 2 g via INTRAVENOUS
  Filled 2022-07-03: qty 12.5

## 2022-07-03 MED ORDER — DOLUTEGRAVIR SODIUM 50 MG PO TABS
50.0000 mg | ORAL_TABLET | Freq: Every day | ORAL | Status: DC
  Administered 2022-07-04: 50 mg via ORAL
  Filled 2022-07-03: qty 1

## 2022-07-03 MED ORDER — TETANUS-DIPHTH-ACELL PERTUSSIS 5-2.5-18.5 LF-MCG/0.5 IM SUSY
0.5000 mL | PREFILLED_SYRINGE | Freq: Once | INTRAMUSCULAR | Status: DC
  Filled 2022-07-03: qty 0.5

## 2022-07-03 MED ORDER — IOHEXOL 300 MG/ML  SOLN
75.0000 mL | Freq: Once | INTRAMUSCULAR | Status: AC | PRN
  Administered 2022-07-04: 75 mL via INTRAVENOUS

## 2022-07-03 MED ORDER — SODIUM CHLORIDE 0.9 % IV BOLUS
1000.0000 mL | Freq: Once | INTRAVENOUS | Status: AC
  Administered 2022-07-03: 1000 mL via INTRAVENOUS

## 2022-07-03 MED ORDER — RIFABUTIN 150 MG PO CAPS: 300.0000 mg | ORAL_CAPSULE | Freq: Every day | ORAL | Status: DC

## 2022-07-03 MED ORDER — EMTRICITABINE-TENOFOVIR AF 200-25 MG PO TABS
1.0000 | ORAL_TABLET | Freq: Every day | ORAL | Status: DC
  Administered 2022-07-04: 1 via ORAL
  Filled 2022-07-03: qty 1

## 2022-07-03 NOTE — ED Provider Notes (Signed)
Patient signed out to me pending CT soft tissue of the neck. Patient presents today because of worsening swelling of the left side of the neck.  Patient has history of HIV AIDS noncompliant with antiretrovirals and history of an mycobacterium avium-intracellular with cervical lymphadenopathy.  He was recently admitted for left left sided neck mass and was evaluated by infectious disease, IR and ENT.  Ultimately the decision was made to treat him for mycobacterial infection with outpatient referral to ENT for likely excision.  On my evaluation patient does have significant swelling of the left side of the neck with some spontaneous drainage.  Does look somewhat larger compared to the photo during last admission.  He is not having any airway compromise.  Tells me he does have his medications and has been taking them.  Plan to obtain repeat CT soft tissue of the neck to evaluate for any progression.  Likely patient will need to have follow-up with tertiary ENT as ENT at [facility] previously did not feel comfortable doing any operative intervention.  On CT there is enlargement of patient's previously seen collections in the neck and lymphadenopathy.  Discussed with ID Dr. Tama High who recommends transfer to [facility].  I asked her that if for some reason we are not able to get the patient accepted at a tertiary care center it would be reasonable for him to follow-up as an outpatient she said as long as he is able to take p.o. that this would be reasonable.  Discussed with the patient my recommendation that we transfer him to be evaluated by ENT.  Discussed that he would potentially need surgical intervention on the neck.  Patient does not want surgery at this time and does not want to be transferred to [facility].  He does understand has capacity.  He says that his prescriptions are going to be delivered tomorrow he did receive a phone call from the pharmacy.  Dr. Tama High recommends ongoing treatment of his  mycobacterial infection.  Given patient does not want surgery and does not want transfer he will be discharged.  We discussed return precautions.  Discussed close follow-up with outpatient ENT at [facility] as well as ID.   Rada Hay, MD 07/03/22 2324    Rada Hay, MD 07/04/22 [phone]
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
## 2022-07-03 NOTE — ED Triage Notes (Addendum)
Pt to ED via [facility] c/o mass on left side of neck. Pt reports this started in November after he had pneumonia and hasn't gotten any antibiotics for it. Pt with disorganized thinking. Hx of schizophrenia. Denies SI/HI

## 2022-07-04 ENCOUNTER — Other Ambulatory Visit: Payer: Self-pay

## 2022-07-04 ENCOUNTER — Encounter: Payer: Self-pay | Admitting: Emergency Medicine

## 2022-07-04 ENCOUNTER — Emergency Department
Admission: EM | Admit: 2022-07-04 | Discharge: 2022-07-04 | Disposition: A | Discharge status: Home / Self Care | Payer: No Typology Code available for payment source | Source: Home / Self Care | Attending: Emergency Medicine | Admitting: Emergency Medicine

## 2022-07-04 DIAGNOSIS — Z87891 Personal history of nicotine dependence: Secondary | ICD-10-CM | POA: Insufficient documentation

## 2022-07-04 DIAGNOSIS — R590 Localized enlarged lymph nodes: Secondary | ICD-10-CM | POA: Diagnosis not present

## 2022-07-04 DIAGNOSIS — F209 Schizophrenia, unspecified: Secondary | ICD-10-CM | POA: Diagnosis not present

## 2022-07-04 DIAGNOSIS — F203 Undifferentiated schizophrenia: Secondary | ICD-10-CM | POA: Insufficient documentation

## 2022-07-04 DIAGNOSIS — Z21 Asymptomatic human immunodeficiency virus [HIV] infection status: Secondary | ICD-10-CM | POA: Insufficient documentation

## 2022-07-04 NOTE — Discharge Instructions (Signed)
It is very important that you continue to take your HIV medications and the treatment for your mycobacterial infection.  Please follow-up with infectious disease as well as the ENT doctors at [facility] as you may need to have surgery to remove the lesions on your neck.

## 2022-07-04 NOTE — ED Provider Notes (Signed)
   [facility] Provider Note    Event Date/Time   First MD Initiated Contact with Patient 07/04/22 0701     (approximate)   History   Mental Health Problem   HPI  Michael Chandler is a 37 y.o. male  who is here to be seen for psychiatric concern. The patient had been seen in the ed last night and discharged this morning for an abscess to the left side of his neck. He continues to not want surgery for that issue. However he states he does feel he needs a psychiatric evaluation for        Physical Exam   Triage Vital Signs: ED Triage Vitals [07/04/22 0657]  Enc Vitals Group     BP (!) 118/98     Pulse Rate (!) 110     Resp 20     Temp 98 F (36.7 C)     Temp Source Oral     SpO2 100 %     Weight 210 lb (95.3 kg)     Height 6\' 5"  (1.956 m)     Head Circumference      Peak Flow      Pain Score 0     Pain Loc      Pain Edu?      Excl. in [place]?     Most recent vital signs: Vitals:   07/04/22 0657  BP: (!) 118/98  Pulse: (!) 110  Resp: 20  Temp: 98 F (36.7 C)  SpO2: 100%   General: Awake, alert, oriented. CV:  Good peripheral perfusion. Regular rate and rhythm. Resp:  Normal effort. Lungs clear. Abd:  No distention.  Other:  Does not appear to be responding to internal stimuli.   ED Results / Procedures / Treatments   Labs (all labs ordered are listed, but only abnormal results are displayed) Labs Reviewed - No data to display   EKG  None   RADIOLOGY None    PROCEDURES:  Critical Care performed: No   MEDICATIONS ORDERED IN ED: Medications - No data to display   IMPRESSION / MDM / [place] / ED COURSE  I reviewed the triage vital signs and the nursing notes.                              Differential diagnosis includes, but is not limited to, schizophrenia, drug induced mood disorder  Patient's presentation is most consistent with acute presentation with potential threat to life or bodily  function.  Patient presented to the emergency department today requesting a psychiatric evaluation.  On exam he does not appear to be responding to any internal stimuli.  Psychiatry did evaluate did not feel he necessitated any inpatient psychiatric admission at this time.  They felt he was psychiatrically cleared.  I did ask the patient again about this abscess on his neck.  He continues to state he is not interested in any surgical intervention.  I know he has been evaluated for this overnight and ID had already been consulted.  Will plan on discharging to continue oral antibiotics and outpatient follow-up as discussed with patient earlier.  FINAL CLINICAL IMPRESSION(S) / ED DIAGNOSES   Final diagnoses:  Schizophrenia, unspecified type ([place])       Note:  This document was prepared using Dragon voice recognition software and may include unintentional dictation errors.    Nance Pear, MD 07/04/22 [phone]

## 2022-07-04 NOTE — ED Triage Notes (Signed)
Pt ambulatory to STAT desk--has been sitting in lobby since being d/c for evaluation of neck abscess; st now he would like to speak with a psychiatrist because he "feels safer behind closed doors and away from the squirrels"; pt denies SI or HI

## 2022-07-04 NOTE — Consult Note (Addendum)
[facility] Face-to-Face Psychiatry Consult   Reason for Consult:  Psychiatric Evaluation  Referring Physician:  Dr.  Patient Identification: Michael Chandler MRN:  [id number] Principal Diagnosis: Schizophrenia, undifferentiated ([place]) Diagnosis:  Principal Problem:   Schizophrenia, undifferentiated ([place])   Total Time spent with patient: 45 minutes  Subjective:  "I need a new prescription for Aripiprazole".  Michael Chandler is a 37 y.o. male being seen for a psychiatric evaluation.  HPI: Chart reviewed and patient seen face-to-face. 37 year old male alert and oriented x 4  Patient was evaluated in the emergency department last night for an abscess on the left side of his neck and was discharged this morning. He remained in the lobby and is now requesting a psychiatric evaluation. The patient reports feeling overwhelmed in his mind, with overwhelming thoughts and difficulty concentrating. The patient is currently prescribed Haldol 5 mg twice a day but requests a new prescription for aripiprazole, insisting on the brand-name over the generic due to personal beliefs about efficacy.  He believes aripiprazole acts as a mood stabilizer that helps him. The patient denies suicidal or homicidal ideation, auditory or visual hallucination, and does not appear to be responding to internal stimuli. No delusional thinking evident during the evaluation.  Denies current use of illicit drugs and alcohol. The patient reports he is currently renting a room from his mother. Patient declined consent to obtain collateral information from his mother.   Past Psychiatric History: History significant for methamphetamine abuse, SI, Schizophrenia.  Psychiatric inpatient hospitalizations August and November 2023.  Patient consulted on the medical floor last week and cleared by psychiatry. Self-reported suicide attempt by drowning.  Risk to Self:  No Risk to Others:  No Prior Inpatient Therapy:  Yes Prior Outpatient Therapy:    Past  Medical History:  Past Medical History:  Diagnosis Date   HIV (human immunodeficiency virus infection) ([place])    Schizophrenia ([place])    per IVC paperwork, pt states does not have this dx    Past Surgical History:  Procedure Laterality Date   IR US GUIDE BX ASP/DRAIN  03/21/2022   Family History: History reviewed. No pertinent family history. Family Psychiatric  History: None reported  Social History:  Social History   Substance and Sexual Activity  Alcohol Use Yes   Comment: occasional     Social History   Substance and Sexual Activity  Drug Use Yes   Types: Methamphetamines, Marijuana, Cocaine   Comment: states occasional marijuana and meth use    Social History   Socioeconomic History   Marital status: Single    Spouse name: Not on file   Number of children: Not on file   Years of education: Not on file   Highest education level: Not on file  Occupational History   Not on file  Tobacco Use   Smoking status: Former    Types: Cigarettes   Smokeless tobacco: Never   Tobacco comments:    Patient denies using tobacco products (03/13/22)  Substance and Sexual Activity   Alcohol use: Yes    Comment: occasional   Drug use: Yes    Types: Methamphetamines, Marijuana, Cocaine    Comment: states occasional marijuana and meth use   Sexual activity: Not Currently    Comment: condoms accepted  Other Topics Concern   Not on file  Social History Narrative   Not on file   Social Determinants of Health   Financial Resource Strain: Not on file  Food Insecurity: No Food Insecurity (06/25/2022)   Hunger Vital  Sign    Worried About Charity fundraiser in the Last Year: Never true    [place] in the Last Year: Never true  Transportation Needs: No Transportation Needs (06/25/2022)   PRAPARE - Hydrologist (Medical): No    Lack of Transportation (Non-Medical): No  Physical Activity: Not on file  Stress: Not on file  Social Connections: Not on  file   Additional Social History:    Allergies:   Allergies  Allergen Reactions   Bactrim [Sulfamethoxazole-Trimethoprim] Anaphylaxis    Labs:  Results for orders placed or performed during the hospital encounter of 07/03/22 (from the past 48 hour(s))  Acetaminophen level     Status: Abnormal   Collection Time: 07/03/22 10:13 PM  Result Value Ref Range   Acetaminophen (Tylenol), Serum <10 (L) 10 - 30 ug/mL    Comment: (NOTE) Therapeutic concentrations vary significantly. A range of 10-30 ug/mL  may be an effective concentration for many patients. However, some  are best treated at concentrations outside of this range. Acetaminophen concentrations >150 ug/mL at 4 hours after ingestion  and >50 ug/mL at 12 hours after ingestion are often associated with  toxic reactions.  Performed at [facility], [address] [place], [place] [address]   Comprehensive metabolic panel     Status: Abnormal   Collection Time: 07/03/22 10:13 PM  Result Value Ref Range   Sodium 136 135 - 145 mmol/L   Potassium 3.7 3.5 - 5.1 mmol/L   Chloride 103 98 - 111 mmol/L   CO2 29 22 - 32 mmol/L   Glucose, Bld 98 70 - 99 mg/dL    Comment: Glucose reference range applies only to samples taken after fasting for at least 8 hours.   BUN 15 6 - 20 mg/dL   Creatinine, Ser 1.47 (H) 0.61 - 1.24 mg/dL   Calcium 8.6 (L) 8.9 - 10.3 mg/dL   Total Protein 9.2 (H) 6.5 - 8.1 g/dL   Albumin 3.5 3.5 - 5.0 g/dL   AST 22 15 - 41 U/L   ALT 15 0 - 44 U/L   Alkaline Phosphatase 126 38 - 126 U/L   Total Bilirubin 0.4 0.3 - 1.2 mg/dL   GFR, Estimated >60 >60 mL/min    Comment: (NOTE) Calculated using the CKD-EPI Creatinine Equation (2021)    Anion gap 4 (L) 5 - 15    Comment: Performed at [facility], [address], [place], [place] [address]  Ethanol     Status: None   Collection Time: 07/03/22 10:13 PM  Result Value Ref Range   Alcohol, Ethyl (B) <10 <10 mg/dL    Comment: (NOTE) Lowest  detectable limit for serum alcohol is 10 mg/dL.  For medical purposes only. Performed at [facility], [address], [place], [place] [id number]   Salicylate level     Status: Abnormal   Collection Time: 07/03/22 10:13 PM  Result Value Ref Range   Salicylate Lvl Q000111Q (L) 7.0 - 30.0 mg/dL    Comment: Performed at [facility] [address] [place], [place] [address]  CBC with Differential     Status: Abnormal   Collection Time: 07/03/22 10:13 PM  Result Value Ref Range   WBC 9.6 4.0 - 10.5 K/uL   RBC 4.19 (L) 4.22 - 5.81 MIL/uL   Hemoglobin 10.4 (L) 13.0 - 17.0 g/dL   HCT 33.3 (L) 39.0 - 52.0 %   MCV 79.5 (L) 80.0 -  100.0 fL   MCH 24.8 (L) 26.0 - 34.0 pg   MCHC 31.2 30.0 - 36.0 g/dL   RDW 17.8 (H) 11.5 - 15.5 %   Platelets 494 (H) 150 - 400 K/uL   nRBC 0.0 0.0 - 0.2 %   Neutrophils Relative % 48 %   Neutro Abs 4.5 1.7 - 7.7 K/uL   Lymphocytes Relative 34 %   Lymphs Abs 3.3 0.7 - 4.0 K/uL   Monocytes Relative 17 %   Monocytes Absolute 1.7 (H) 0.1 - 1.0 K/uL   Eosinophils Relative 1 %   Eosinophils Absolute 0.1 0.0 - 0.5 K/uL   Basophils Relative 0 %   Basophils Absolute 0.0 0.0 - 0.1 K/uL   Immature Granulocytes 0 %   Abs Immature Granulocytes 0.03 0.00 - 0.07 K/uL    Comment: Performed at [facility], [address] [place], [place] [address]    No current facility-administered medications for this encounter.   Current Outpatient Medications  Medication Sig Dispense Refill   atovaquone (MEPRON) 750 MG/5ML suspension Take 10 mLs (1,500 mg total) by mouth daily. 300 mL 3   azithromycin (ZITHROMAX) 500 MG tablet Take 1 tablet (500 mg total) by mouth daily. 30 tablet 3   benztropine (COGENTIN) 1 MG tablet Take 1 tablet (1 mg total) by mouth daily. 30 tablet 0   dolutegravir (TIVICAY) 50 MG tablet Take 1 tablet (50 mg total) by mouth daily. 30 tablet 3   emtricitabine-tenofovir AF (DESCOVY) 200-25 MG tablet Take 1 tablet by mouth daily. 30  tablet 3   ethambutol (MYAMBUTOL) 400 MG tablet Take 3 tablets (1,200 mg total) by mouth daily. 90 tablet 3   gabapentin (NEURONTIN) 300 MG capsule Take 1 capsule (300 mg total) by mouth 3 (three) times daily. 90 capsule 0   haloperidol (HALDOL) 5 MG tablet Take 1 tablet (5 mg total) by mouth 2 (two) times daily. 60 tablet 0   naproxen (NAPROSYN) 250 MG tablet Take 1 tablet (250 mg total) by mouth 2 (two) times daily with a meal. 60 tablet 0   rifabutin (MYCOBUTIN) 150 MG capsule Take 2 capsules (300 mg total) by mouth daily. 60 capsule 3   traZODone (DESYREL) 150 MG tablet Take 1 tablet (150 mg total) by mouth at bedtime. 30 tablet 0    Musculoskeletal: Strength & Muscle Tone: within normal limits Gait & Station: normal Patient leans: N/A    Psychiatric Specialty Exam: Physical Exam Vitals and nursing note reviewed.  HENT:     Head: Normocephalic and atraumatic.     Nose: Nose normal.  Musculoskeletal:        General: Normal range of motion.     Cervical back: Normal range of motion.  Neurological:     General: No focal deficit present.     Mental Status: He is alert and oriented to person, place, and time.  Psychiatric:        Attention and Perception: Attention normal.        Mood and Affect: Mood and affect normal.        Speech: Speech normal.        Behavior: Behavior normal. Behavior is cooperative.        Thought Content: Thought content normal. Thought content does not include homicidal or suicidal ideation.        Cognition and Memory: Cognition and memory normal.        Judgment: Judgment normal.     Review of Systems  Blood pressure Marland Kitchen)  118/98, pulse (!) 110, temperature 98 F (36.7 C), temperature source Oral, resp. rate 20, height '6\' 5"'$  (1.956 m), weight 95.3 kg, SpO2 100 %.Body mass index is 24.9 kg/m.  General Appearance: Casual  Eye Contact:  Good  Speech:  Clear and Coherent  Volume:  Normal  Mood:   Cooperative   Affect:  Congruent  Thought Process:   Coherent  Orientation:  Full (Time, Place, and Person)  Thought Content:  WDL  Suicidal Thoughts:  No  Homicidal Thoughts:  No  Memory:  Immediate;   Good  Judgement:  Fair  Insight:  Good  Psychomotor Activity:  Normal  Concentration:  Concentration: Good  Recall:  Good  Fund of Knowledge:  Good  Language:  Good  Akathisia:  No  Handed:  Right  AIMS (if indicated):     Assets:  Communication Skills Desire for Improvement Financial Resources/Insurance Resilience  ADL's:  Intact  Cognition:  WNL  Sleep:   WNL       Physical Exam: Physical Exam Vitals and nursing note reviewed.  HENT:     Head: Normocephalic and atraumatic.     Nose: Nose normal.  Musculoskeletal:        General: Normal range of motion.     Cervical back: Normal range of motion.  Neurological:     General: No focal deficit present.     Mental Status: He is alert and oriented to person, place, and time.  Psychiatric:        Attention and Perception: Attention normal.        Mood and Affect: Mood and affect normal.        Speech: Speech normal.        Behavior: Behavior normal. Behavior is cooperative.        Thought Content: Thought content normal. Thought content does not include homicidal or suicidal ideation.        Cognition and Memory: Cognition and memory normal.        Judgment: Judgment normal.    ROS Blood pressure (!) 118/98, pulse (!) 110, temperature 98 F (36.7 C), temperature source Oral, resp. rate 20, height '6\' 5"'$  (1.956 m), weight 95.3 kg, SpO2 100 %. Body mass index is 24.9 kg/m.  Treatment Plan Summary:  Patient cleared by psychiatry. No indication that patient experiencing crisis criteria and no reason to believe patient would benefit from inpatient psychiatric hospitalization. Patient encouraged to follow-up with outpatient provider regarding prescription concerns.  Reviewed with Dr. Archie Balboa, [place].  Disposition: No evidence of imminent risk to self or others at present.    Patient does not meet criteria for psychiatric inpatient admission. Supportive therapy provided about ongoing stressors. Discussed crisis plan, support from social network, calling 911, coming to the Emergency Department, and calling Suicide Hotline.  Ronny Flurry, NP 07/04/2022 11:59 AM

## 2022-07-11 ENCOUNTER — Other Ambulatory Visit (HOSPITAL_COMMUNITY): Payer: Self-pay

## 2022-07-12 ENCOUNTER — Telehealth: Payer: Self-pay

## 2022-07-12 NOTE — Telephone Encounter (Signed)
Patient called office today requesting call back from Dr. Delaine Lame. Patient did not want to give information on the reason for call. Requested to speak with provider only.  Confirmed phone number in chart.  Leatrice Jewels, RMA

## 2022-07-19 ENCOUNTER — Other Ambulatory Visit (HOSPITAL_COMMUNITY): Payer: Self-pay

## 2022-07-20 ENCOUNTER — Inpatient Hospital Stay: Payer: No Typology Code available for payment source | Admitting: Infectious Diseases

## 2022-07-21 ENCOUNTER — Other Ambulatory Visit (HOSPITAL_COMMUNITY): Payer: Self-pay

## 2022-07-22 ENCOUNTER — Emergency Department
Admission: EM | Admit: 2022-07-22 | Discharge: 2022-07-22 | Disposition: A | Discharge status: Home / Self Care | Payer: No Typology Code available for payment source | Attending: Emergency Medicine | Admitting: Emergency Medicine

## 2022-07-22 DIAGNOSIS — W5321XA Bitten by squirrel, initial encounter: Secondary | ICD-10-CM | POA: Diagnosis not present

## 2022-07-22 DIAGNOSIS — S31050A Open bite of lower back and pelvis without penetration into retroperitoneum, initial encounter: Secondary | ICD-10-CM | POA: Insufficient documentation

## 2022-07-22 DIAGNOSIS — Z2914 Encounter for prophylactic rabies immune globin: Secondary | ICD-10-CM | POA: Diagnosis not present

## 2022-07-22 DIAGNOSIS — Z21 Asymptomatic human immunodeficiency virus [HIV] infection status: Secondary | ICD-10-CM | POA: Diagnosis not present

## 2022-07-22 DIAGNOSIS — Z203 Contact with and (suspected) exposure to rabies: Secondary | ICD-10-CM | POA: Insufficient documentation

## 2022-07-22 DIAGNOSIS — W5581XA Bitten by other mammals, initial encounter: Secondary | ICD-10-CM | POA: Diagnosis not present

## 2022-07-22 DIAGNOSIS — Z23 Encounter for immunization: Secondary | ICD-10-CM | POA: Diagnosis not present

## 2022-07-22 DIAGNOSIS — I10 Essential (primary) hypertension: Secondary | ICD-10-CM | POA: Diagnosis not present

## 2022-07-22 DIAGNOSIS — F22 Delusional disorders: Secondary | ICD-10-CM | POA: Diagnosis not present

## 2022-07-22 MED ORDER — RABIES VACCINE, PCEC IM SUSR
1.0000 mL | Freq: Once | INTRAMUSCULAR | Status: AC
  Administered 2022-07-22: 1 mL via INTRAMUSCULAR
  Filled 2022-07-22: qty 1

## 2022-07-22 MED ORDER — AMOXICILLIN-POT CLAVULANATE 875-125 MG PO TABS: 1.0000 | ORAL_TABLET | Freq: Two times a day (BID) | ORAL | 0 refills | Status: AC

## 2022-07-22 MED ORDER — RABIES IMMUNE GLOBULIN 150 UNIT/ML IM INJ
20.0000 [IU]/kg | INJECTION | Freq: Once | INTRAMUSCULAR | Status: AC
  Administered 2022-07-22: 1875 [IU]
  Filled 2022-07-22: qty 14

## 2022-07-22 NOTE — ED Provider Notes (Signed)
[facility] Provider Note  Patient Contact: 7:07 PM (approximate)   History   Animal Bite   HPI  Michael Chandler is a 37 y.o. male who presents the emergency department complaining of a squirrel bite to the perineal area between his scrotum and anus.  Patient states he was just sitting there enjoying the weather, squirrel ran out, ran up his pant leg, bit him, left turn around and ran back out of the leg of his pants.  Patient denies any other injury or complaint.  Patient has a history of polysubstance abuse, schizophrenia, psychosis, syphilis, HSV, candidal infection, HIV/AIDS.  Patient appears very agitated, cannot sit still, has an elevated heart rate at the time.  Denies any substance use prior to arrival.      Physical Exam   Triage Vital Signs: ED Triage Vitals  Enc Vitals Group     BP 07/22/22 1858 (!) 108/96     Pulse Rate 07/22/22 1901 (!) 142     Resp --      Temp 07/22/22 1858 98.5 F (36.9 C)     Temp Source 07/22/22 1858 Oral     SpO2 07/22/22 1858 100 %     Weight 07/22/22 1856 205 lb (93 kg)     Height 07/22/22 1856 6\' 5"  (1.956 m)     Head Circumference --      Peak Flow --      Pain Score 07/22/22 1855 10     Pain Loc --      Pain Edu? --      Excl. in [place]? --     Most recent vital signs: Vitals:   07/22/22 1858 07/22/22 1901  BP: (!) 108/96   Pulse:  (!) 142  Temp: 98.5 F (36.9 C)   SpO2: 100%      General: Alert and in no acute distress.  Cardiovascular:  Good peripheral perfusion Respiratory: Normal respiratory effort without tachypnea or retractions. Lungs CTAB.  Genitourinary: Evaluation of the perineal area is performed with male RN in the room.  Patient has no evidence of animal bite, scratch, lacerations or trauma to the genitals, perineal region, buttocks. Musculoskeletal: Full range of motion to all extremities.  Neurologic:  No gross focal neurologic deficits are appreciated.  Skin:   No rash  noted Other:   ED Results / Procedures / Treatments   Labs (all labs ordered are listed, but only abnormal results are displayed) Labs Reviewed - No data to display   EKG     RADIOLOGY    No results found.  PROCEDURES:  Critical Care performed: No  Procedures   MEDICATIONS ORDERED IN ED: Medications  rabies vaccine (RABAVERT) injection 1 mL (1 mL Intramuscular Given 07/22/22 1935)  rabies immune globulin (HYPERRAB/KEDRAB) injection 1,875 Units (1,875 Units Infiltration Given 07/22/22 1939)     IMPRESSION / MDM / ASSESSMENT AND PLAN / ED COURSE  I reviewed the triage vital signs and the nursing notes.                                 Differential diagnosis includes, but is not limited to, animal bite, psychosis, polysubstance abuse, hallucinations  Patient's presentation is most consistent with acute presentation with potential threat to life or bodily function.   Patient's diagnosis is consistent with reported animal bite.  Patient presents emergency department stating that he was sitting outside, enjoying the sunlight when a squirrel  ran up to him, ran up his pant legs, bit him in the perineal area, turned around while in his pants, ran out the same pant leg.  Patient is pointing to the area that he was bitten, however examination of this region reveals no visible signs of trauma or bite wounds.  Patient is adamant that this occurred and this was a real squirrel.  Given his medical history, I feel there is a high likelihood that this could be secondary to polysubstance use/abuse, hallucination, psychosis or part of his schizophrenia.  However patient verbalizes that this really occurred.  As such this is very atypical behavior of school and while typically we would not hide provide rabies prophylaxis for a rodent bite, this is too far out of the ordinary not to provide prophylaxis.  Patient is an HIV/age patient as well.  Patient has no other complaints.  While in the  room patient is very agitated, cannot sit still, does have an elevated heart rate.  Suspect that patient does have substance in his system, however patient is adamantly refusing any further evaluation.  Patient does not appear to be at risk for self-harm or harm to others and denies any suicidal or homicidal ideation currently..  At this time patient has received the rabies prophylaxis immunoglobin and initial vaccine.  He knows to follow-up in 3 days for second rabies vaccine.  Antibiotic prescribed.  Concerning signs and symptoms and return precautions discussed with the patient.  Patient is given ED precautions to return to the ED for any worsening or new symptoms.     FINAL CLINICAL IMPRESSION(S) / ED DIAGNOSES   Final diagnoses:  Bitten by squirrel, initial encounter  Need for post exposure prophylaxis for rabies     Rx / DC Orders   ED Discharge Orders          Ordered    amoxicillin-clavulanate (AUGMENTIN) 875-125 MG tablet  2 times daily        07/22/22 1957             Note:  This document was prepared using Dragon voice recognition software and may include unintentional dictation errors.   Brynda Peon 07/22/22 Chevis Pretty, MD 07/23/22 1126

## 2022-07-22 NOTE — ED Triage Notes (Signed)
Pt BIBA from [facility]. Pt reports a squirrel came up and bite him between the scrotum and anus.

## 2022-07-24 ENCOUNTER — Other Ambulatory Visit (HOSPITAL_COMMUNITY): Payer: Self-pay

## 2022-07-25 ENCOUNTER — Emergency Department: Payer: Medicare Other

## 2022-07-25 ENCOUNTER — Emergency Department
Admission: EM | Admit: 2022-07-25 | Discharge: 2022-07-28 | Disposition: A | Discharge status: Home / Self Care | Payer: Medicare Other | Attending: Emergency Medicine | Admitting: Emergency Medicine

## 2022-07-25 DIAGNOSIS — F1510 Other stimulant abuse, uncomplicated: Secondary | ICD-10-CM

## 2022-07-25 DIAGNOSIS — D72829 Elevated white blood cell count, unspecified: Secondary | ICD-10-CM | POA: Insufficient documentation

## 2022-07-25 DIAGNOSIS — L03221 Cellulitis of neck: Secondary | ICD-10-CM | POA: Diagnosis not present

## 2022-07-25 DIAGNOSIS — D649 Anemia, unspecified: Secondary | ICD-10-CM | POA: Diagnosis present

## 2022-07-25 DIAGNOSIS — F1520 Other stimulant dependence, uncomplicated: Secondary | ICD-10-CM | POA: Diagnosis present

## 2022-07-25 DIAGNOSIS — B20 Human immunodeficiency virus [HIV] disease: Secondary | ICD-10-CM | POA: Diagnosis present

## 2022-07-25 DIAGNOSIS — A310 Pulmonary mycobacterial infection: Secondary | ICD-10-CM | POA: Diagnosis not present

## 2022-07-25 DIAGNOSIS — L0211 Cutaneous abscess of neck: Secondary | ICD-10-CM | POA: Diagnosis not present

## 2022-07-25 DIAGNOSIS — M4642 Discitis, unspecified, cervical region: Secondary | ICD-10-CM | POA: Diagnosis present

## 2022-07-25 DIAGNOSIS — Z21 Asymptomatic human immunodeficiency virus [HIV] infection status: Secondary | ICD-10-CM | POA: Diagnosis not present

## 2022-07-25 DIAGNOSIS — R221 Localized swelling, mass and lump, neck: Secondary | ICD-10-CM

## 2022-07-25 DIAGNOSIS — N179 Acute kidney failure, unspecified: Secondary | ICD-10-CM | POA: Diagnosis present

## 2022-07-25 DIAGNOSIS — M542 Cervicalgia: Secondary | ICD-10-CM | POA: Diagnosis present

## 2022-07-25 DIAGNOSIS — A539 Syphilis, unspecified: Secondary | ICD-10-CM | POA: Diagnosis present

## 2022-07-25 DIAGNOSIS — B181 Chronic viral hepatitis B without delta-agent: Secondary | ICD-10-CM | POA: Diagnosis present

## 2022-07-25 DIAGNOSIS — F332 Major depressive disorder, recurrent severe without psychotic features: Secondary | ICD-10-CM | POA: Diagnosis not present

## 2022-07-25 DIAGNOSIS — F29 Unspecified psychosis not due to a substance or known physiological condition: Secondary | ICD-10-CM | POA: Diagnosis present

## 2022-07-25 LAB — COMPREHENSIVE METABOLIC PANEL
ALT: 28 U/L (ref 0–44)
AST: 50 U/L — ABNORMAL HIGH (ref 15–41)
Albumin: 3.8 g/dL (ref 3.5–5.0)
Alkaline Phosphatase: 120 U/L (ref 38–126)
Anion gap: 9 (ref 5–15)
BUN: 27 mg/dL — ABNORMAL HIGH (ref 6–20)
CO2: 23 mmol/L (ref 22–32)
Calcium: 9.3 mg/dL (ref 8.9–10.3)
Chloride: 104 mmol/L (ref 98–111)
Creatinine, Ser: 1.23 mg/dL (ref 0.61–1.24)
GFR, Estimated: >60 mL/min (ref >60)
Glucose, Bld: 114 mg/dL — ABNORMAL HIGH (ref 70–99)
Potassium: 3.6 mmol/L (ref 3.5–5.1)
Sodium: 136 mmol/L (ref 135–145)
Total Bilirubin: 0.8 mg/dL (ref 0.3–1.2)
Total Protein: 9.5 g/dL — ABNORMAL HIGH (ref 6.5–8.1)

## 2022-07-25 LAB — CBC
HCT: 34.8 % — ABNORMAL LOW (ref 39.0–52.0)
Hemoglobin: 11.1 g/dL — ABNORMAL LOW (ref 13.0–17.0)
MCH: 25 pg — ABNORMAL LOW (ref 26.0–34.0)
MCHC: 31.9 g/dL (ref 30.0–36.0)
MCV: 78.4 fL — ABNORMAL LOW (ref 80.0–100.0)
Platelets: 641 10*3/uL — ABNORMAL HIGH (ref 150–400)
RBC: 4.44 MIL/uL (ref 4.22–5.81)
RDW: 18.6 % — ABNORMAL HIGH (ref 11.5–15.5)
WBC: 11.3 10*3/uL — ABNORMAL HIGH (ref 4.0–10.5)
nRBC: 0 % (ref 0.0–0.2)

## 2022-07-25 LAB — LACTIC ACID, PLASMA
Lactic Acid, Venous: 2.4 mmol/L (ref 0.5–1.9)
Lactic Acid, Venous: 1.7 mmol/L (ref 0.5–1.9)

## 2022-07-25 MED ORDER — IOHEXOL 300 MG/ML  SOLN
75.0000 mL | Freq: Once | INTRAMUSCULAR | Status: AC | PRN
  Administered 2022-07-25: 75 mL via INTRAVENOUS

## 2022-07-25 MED ORDER — SODIUM CHLORIDE 0.9 % IV SOLN: INTRAVENOUS | Status: AC

## 2022-07-25 MED ORDER — MORPHINE SULFATE (PF) 4 MG/ML IV SOLN
4.0000 mg | Freq: Once | INTRAVENOUS | Status: AC
  Administered 2022-07-25: 4 mg via INTRAVENOUS
  Filled 2022-07-25: qty 1

## 2022-07-25 MED ORDER — RIFABUTIN 150 MG PO CAPS
300.0000 mg | ORAL_CAPSULE | Freq: Every day | ORAL | Status: DC
  Filled 2022-07-25: qty 2

## 2022-07-25 MED ORDER — SODIUM CHLORIDE 0.9 % IV SOLN
2.0000 g | INTRAVENOUS | Status: DC
  Administered 2022-07-26 – 2022-07-28 (×3): 2 g via INTRAVENOUS
  Filled 2022-07-25 (×3): qty 20

## 2022-07-25 MED ORDER — SODIUM CHLORIDE 0.9 % IV SOLN
1.0000 g | Freq: Once | INTRAVENOUS | Status: AC
  Administered 2022-07-25: 1 g via INTRAVENOUS
  Filled 2022-07-25: qty 10

## 2022-07-25 MED ORDER — EMTRICITABINE-TENOFOVIR AF 200-25 MG PO TABS
1.0000 | ORAL_TABLET | Freq: Every day | ORAL | Status: DC
  Administered 2022-07-25 – 2022-07-28 (×4): 1 via ORAL
  Filled 2022-07-25 (×4): qty 1

## 2022-07-25 MED ORDER — ONDANSETRON HCL 4 MG/2ML IJ SOLN
4.0000 mg | Freq: Once | INTRAMUSCULAR | Status: AC
  Administered 2022-07-25: 4 mg via INTRAVENOUS
  Filled 2022-07-25: qty 2

## 2022-07-25 MED ORDER — RIFABUTIN 150 MG PO CAPS
300.0000 mg | ORAL_CAPSULE | Freq: Every day | ORAL | Status: DC
  Administered 2022-07-26 – 2022-07-28 (×3): 300 mg via ORAL
  Filled 2022-07-25 (×3): qty 2

## 2022-07-25 MED ORDER — SODIUM CHLORIDE 0.9 % IV SOLN: 1.0000 g | Freq: Two times a day (BID) | INTRAVENOUS | Status: DC

## 2022-07-25 MED ORDER — RIFABUTIN 150 MG PO CAPS
300.0000 mg | ORAL_CAPSULE | Freq: Once | ORAL | Status: AC
  Administered 2022-07-25: 300 mg via ORAL
  Filled 2022-07-25: qty 2

## 2022-07-25 MED ORDER — AZITHROMYCIN 500 MG PO TABS
500.0000 mg | ORAL_TABLET | Freq: Every day | ORAL | Status: DC
  Administered 2022-07-26 – 2022-07-28 (×3): 500 mg via ORAL
  Filled 2022-07-25 (×3): qty 1

## 2022-07-25 MED ORDER — ETHAMBUTOL HCL 400 MG PO TABS
1200.0000 mg | ORAL_TABLET | Freq: Once | ORAL | Status: AC
  Administered 2022-07-25: 1200 mg via ORAL
  Filled 2022-07-25: qty 3

## 2022-07-25 MED ORDER — DOLUTEGRAVIR SODIUM 50 MG PO TABS
50.0000 mg | ORAL_TABLET | Freq: Every day | ORAL | Status: DC
  Administered 2022-07-25 – 2022-07-28 (×4): 50 mg via ORAL
  Filled 2022-07-25 (×4): qty 1

## 2022-07-25 MED ORDER — ETHAMBUTOL HCL 400 MG PO TABS
1200.0000 mg | ORAL_TABLET | Freq: Every day | ORAL | Status: DC
  Filled 2022-07-25: qty 3

## 2022-07-25 MED ORDER — KETOROLAC TROMETHAMINE 30 MG/ML IJ SOLN
15.0000 mg | Freq: Once | INTRAMUSCULAR | Status: AC
  Administered 2022-07-25: 15 mg via INTRAVENOUS
  Filled 2022-07-25: qty 1

## 2022-07-25 MED ORDER — SODIUM CHLORIDE 0.9 % IV SOLN
500.0000 mg | Freq: Once | INTRAVENOUS | Status: AC
  Administered 2022-07-25: 500 mg via INTRAVENOUS
  Filled 2022-07-25: qty 5

## 2022-07-25 MED ORDER — ATOVAQUONE 750 MG/5ML PO SUSP
1500.0000 mg | Freq: Every day | ORAL | Status: DC
  Administered 2022-07-25 – 2022-07-28 (×4): 1500 mg via ORAL
  Filled 2022-07-25 (×4): qty 10

## 2022-07-25 MED ORDER — SODIUM CHLORIDE 0.9 % IV BOLUS
1000.0000 mL | Freq: Once | INTRAVENOUS | Status: AC
  Administered 2022-07-25: 1000 mL via INTRAVENOUS

## 2022-07-25 MED ORDER — ETHAMBUTOL HCL 400 MG PO TABS
1200.0000 mg | ORAL_TABLET | Freq: Every day | ORAL | Status: DC
  Administered 2022-07-26 – 2022-07-28 (×3): 1200 mg via ORAL
  Filled 2022-07-25 (×3): qty 3

## 2022-07-25 MED ORDER — HYDROCODONE-ACETAMINOPHEN 5-325 MG PO TABS: 1.0000 | ORAL_TABLET | Freq: Four times a day (QID) | ORAL | 0 refills | Status: DC | PRN

## 2022-07-25 NOTE — ED Notes (Signed)
Pt keeps removing all leads, BP cuff, SPO2 multiple times during this RN shift and have been put back on repeatedly. States "he just does not want to have them on."

## 2022-07-25 NOTE — ED Notes (Signed)
Dr Charna Archer notified of lactic 2.4. orders placed.

## 2022-07-25 NOTE — ED Notes (Signed)
Patient given grape juice and apple sauce per request.

## 2022-07-25 NOTE — ED Notes (Signed)
Pt given lunch tray at bedside.

## 2022-07-25 NOTE — ED Triage Notes (Signed)
Pt here with left sided neck pain for a while. Pt states he was admitted at one point for IV abx for his neck infx. Pt states the pain is getting worse and sharp. Pt also having diarrhea. Pt state the pain also made him dizzy at times.

## 2022-07-25 NOTE — ED Provider Notes (Signed)
[facility] Provider Note    Event Date/Time   First MD Initiated Contact with Patient 07/25/22 [phone]     (approximate)   History   Chief Complaint Neck Pain   HPI  Michael Chandler is a 37 y.o. male with past medical history of HIV/AIDS, schizophrenia, polysubstance abuse, and cervical MAI infection who presents to the ED complaining of neck pain.  Patient reports that he has been dealing with increasing pain and swelling to the left side of his neck due to ongoing infection.  He states he has been compliant with antibiotic regimen previously prescribed by infectious disease for MAI, with his last dose coming last night.  He denies any fevers and has had not had any difficulty swallowing or difficulty breathing.  He states a new wound has opened up on the side of his neck and has been draining pus at times.     Physical Exam   Triage Vital Signs: ED Triage Vitals [07/25/22 0825]  Enc Vitals Group     BP (!) 120/101     Pulse Rate (!) 134     Resp (!) 22     Temp 98.3 F (36.8 C)     Temp Source Oral     SpO2 99 %     Weight 205 lb 0.4 oz (93 kg)     Height 6\' 5"  (1.956 m)     Head Circumference      Peak Flow      Pain Score 8     Pain Loc      Pain Edu?      Excl. in [place]?     Most recent vital signs: Vitals:   07/25/22 1153 07/25/22 1200  BP: 103/85 119/81  Pulse:  81  Resp: 20 15  Temp:  98.3 F (36.8 C)  SpO2: 97% 100%    Constitutional: Alert and oriented. Eyes: Conjunctivae are normal. Head: Atraumatic. Nose: No congestion/rhinnorhea. Mouth/Throat: Mucous membranes are moist.  Neck: Left neck edema and tenderness with multiple tender nodules and 2 open wounds with purulent drainage.  See picture below. Cardiovascular: Normal rate, regular rhythm. Grossly normal heart sounds.  2+ radial pulses bilaterally. Respiratory: Normal respiratory effort.  No retractions. Lungs CTAB. Gastrointestinal: Soft and nontender. No  distention. Musculoskeletal: No lower extremity tenderness nor edema.  Neurologic:  Normal speech and language. No gross focal neurologic deficits are appreciated.    ED Results / Procedures / Treatments   Labs (all labs ordered are listed, but only abnormal results are displayed) Labs Reviewed  CBC - Abnormal; Notable for the following components:      Result Value   WBC 11.3 (*)    Hemoglobin 11.1 (*)    HCT 34.8 (*)    MCV 78.4 (*)    MCH 25.0 (*)    RDW 18.6 (*)    Platelets 641 (*)    All other components within normal limits  COMPREHENSIVE METABOLIC PANEL - Abnormal; Notable for the following components:   Glucose, Bld 114 (*)    BUN 27 (*)    Total Protein 9.5 (*)    AST 50 (*)    All other components within normal limits  LACTIC ACID, PLASMA - Abnormal; Notable for the following components:   Lactic Acid, Venous 2.4 (*)    All other components within normal limits  CULTURE, BLOOD (ROUTINE X 2)  CULTURE, BLOOD (ROUTINE X 2)  LACTIC ACID, PLASMA     EKG  ED  ECG REPORT I, Blake Divine, the attending physician, personally viewed and interpreted this ECG.   Date: 07/25/2022  EKG Time: 8:30  Rate: 133  Rhythm: sinus tachycardia  Axis: Normal  Intervals:none  ST&T Change: None  RADIOLOGY CT soft tissue neck reviewed and interpreted by me with edema and fluid pocket concerning for abscess.  PROCEDURES:  Critical Care performed: Yes, see critical care procedure note(s)  .Critical Care  Performed by: Blake Divine, MD Authorized by: Blake Divine, MD   Critical care provider statement:    Critical care time (minutes):  30   Critical care time was exclusive of:  Separately billable procedures and treating other patients and teaching time   Critical care was necessary to treat or prevent imminent or life-threatening deterioration of the following conditions: Complex infectious process.   Critical care was time spent personally by me on the following  activities:  Development of treatment plan with patient or surrogate, discussions with consultants, evaluation of patient's response to treatment, examination of patient, ordering and review of laboratory studies, ordering and review of radiographic studies, ordering and performing treatments and interventions, pulse oximetry, re-evaluation of patient's condition and review of old charts   I assumed direction of critical care for this patient from another provider in my specialty: no     Care discussed with: accepting provider at another facility      [place] [facility]: Medications  atovaquone (MEPRON) 750 MG/5ML suspension 1,500 mg (has no administration in time range)  dolutegravir (TIVICAY) tablet 50 mg (has no administration in time range)  emtricitabine-tenofovir AF (DESCOVY) 200-25 MG per tablet 1 tablet (has no administration in time range)  ethambutol (MYAMBUTOL) tablet 1,200 mg (has no administration in time range)  rifabutin (MYCOBUTIN) capsule 300 mg (has no administration in time range)  cefTRIAXone (ROCEPHIN) 1 g in sodium chloride 0.9 % 100 mL IVPB (has no administration in time range)  iohexol (OMNIPAQUE) 300 MG/ML solution 75 mL (75 mLs Intravenous Contrast Given 07/25/22 0934)  sodium chloride 0.9 % bolus 1,000 mL (0 mLs Intravenous Stopped 07/25/22 1117)  ethambutol (MYAMBUTOL) tablet 1,200 mg (1,200 mg Oral Given 07/25/22 1118)  rifabutin (MYCOBUTIN) capsule 300 mg (300 mg Oral Given 07/25/22 1118)  azithromycin (ZITHROMAX) 500 mg in sodium chloride 0.9 % 250 mL IVPB (0 mg Intravenous Stopped 07/25/22 1158)  cefTRIAXone (ROCEPHIN) 1 g in sodium chloride 0.9 % 100 mL IVPB (0 g Intravenous Stopped 07/25/22 1054)     IMPRESSION / MDM / [place] / ED COURSE  I reviewed the triage vital signs and the nursing notes.                              37 y.o. 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  Patient's presentation is most consistent with acute presentation with potential threat to life or bodily function.  Differential diagnosis includes, but is not limited to, abscess, cellulitis, MAI infection, sepsis.  Patient chronically ill-appearing but in no acute distress, vital signs remarkable for tachycardia but otherwise reassuring.  This appears to be a chronic issue ongoing for multiple months but worsening recently.  Patient reports being compliant with antibiotic regimen, however infectious disease previously noted that antibiotics were last filled in December and last  CD4 count was 148.  We will check labs including blood cultures, discussed initial antibiotics with infectious disease.  They have previously recommended transfer to [facility] for specialized ENT care, patient states he is open to this as well as possible surgical intervention.  Labs remarkable for mild leukocytosis as well as mild lactic acidosis, which improved following IV fluids.  No significant anemia, electrolyte abnormality, or AKI noted.  CT imaging shows persistent abscess but improved in size compared to previous.  [facility] transfer center contacted as transfer previously recommended both by ENT and infectious disease.  I spoke with hospitalist, Dr. Orland Penman, who accepted patient for transfer.      FINAL CLINICAL IMPRESSION(S) / ED DIAGNOSES   Final diagnoses:  MAI (mycobacterium avium-intracellulare) infection  Neck swelling     Rx / DC Orders   ED Discharge Orders     None        Note:  This document was prepared using Dragon voice recognition software and may include unintentional dictation errors.   Blake Divine, MD 07/25/22 1215

## 2022-07-25 NOTE — ED Notes (Signed)
Called [facility] pt is accepted and still on waiting list per Rodman Key in transfer center, will call us when a bed is assigned

## 2022-07-25 NOTE — ED Notes (Signed)
Pt currently A&Ox4. Currently waiting on an updated plan of care for patient when MD converses with [facility]. Pt continuously picking at neck wounds and at elbows. Also requesting liquids to drink but waiting until have update on POC. Pt pleasant and cooperative at this time. Took PO medications and tolerated well.

## 2022-07-25 NOTE — ED Notes (Signed)
See triage note. Pt reports here for worsening neck pain d/t wounds. Wounds noted to left side of neck. Pt noted to be constantly scratching neck.

## 2022-07-25 NOTE — Consult Note (Signed)
NAME: Michael Chandler  DOB: May 07, 1985  MRN: [id number]  Date/Time: 07/25/2022 6:54 PM  REQUESTING PROVIDER: Dr.Jessup Subjective:  REASON FOR CONSULT: AIDS/MAI ? Michael Chandler is a 37 y.o. 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 Patient has complicated medical history He is from [place] and had moved to [place].  2 years ago. He initially denied having HIV  while admitted in psych unit in August 2023 but later acknowledged that he was on [place] when he was in [place] but he had been out of care.  In August 2023 his viral load was 174 K and CD4 was 99.  He was restarted on Symtuza In October 2023 was admitted to [facility] in [place] and was extensively investigated for opportunistic infection including lumbar puncture.  At that time HIV RNA was 160,000 and CD4 was 48. He also had hepatitis B DNA of 661. RPR was 1:2  positive and tPA was positive. He was then started on Biktarvy dapsone and fluconazole and discharged on the same.  He was admitted to behavioral health unit in [place], [place] in Nov 2023 and after discharge he followed with infectious disease physician Dr. Juleen China at [facility] of infectious disease, [place] on 03/13/22.  He was complaining of pain in the left side of his neck during that visit and an MRI was ordered.  Patient never followed through. He was later admitted to [facility] and during that hospitalization I saw him on 03/16/2022 and he was noted to have left  cervical lymphadenitis.  CT neck showed multiple lymph nodes on the left side.  Blood culture was sent for AFB which was negative. 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 He had needle biopsy of the lymph node   On 03/21/2022 and it showed necrotizing granulomatous inflammation.  Acid-fast bacilli were seen. ID work up then revealed 03/13/22 VL 3180 Cd4 went upto to 181 on 03/18/22 ( from 48 on 02/14/22)) CMV DNA 617- low level- was not treated due to low copies EBV dna positive but < 35 copies Quantiferon TB gold 03/18/23  -negative Sputum AFB 11/23 -MAI 03/18/22- sputum Afb -MAI 11/28 -sputum Afb - MAI 11/28 biopsy  of neck- MAI 03/18/22 Blood culture  Afb- negative Bartonella-Neg Toxo-Neg Fungal antibodies negative   he was asked to follow-up with his IDP but did not keep the appointment On 04/04/2022 was readmitted to [facility] for fever and I started him on MAC therapy with rifabutin, azithromycin and ethambutol. Because he was not rifabutin the plan was to change Biktarvy Descovy( TAF+FTC) and Tivicay.( Dolutegravir) Patient has been seen by psychiatrist during all his stay He initially was taking MAC treatment regularly and lymph nodes were starting to regress but but later in December 2023 when he was seen he was not taking his meds and the lymph nodes were starting to enlarge.  Also he had a draining lymph node and during his prior hospitalization ENT was consulted and they had talked to [place] and for possible lymph node excision.  The plan was to send him as outpatient  but he never made an appointment.  He did not follow after that and got admitted to  [facility] on 06/26/2022 because of psychotic behavior noticed by his mom and he was positive for crystal meth.  He was complaining of squirrels running over his body.  Since then patient has been visiting ED frequently with the same complaint. Today he came to the ED because he had developed another draining wound and  has pain.  He denies any fever He does not have any cough or shortness of breath He is now claiming to be adherent to medications because he has been given daily blister packs. Along with ethambutol, azithromycin, rifabutin, Tivicay, Descovy, atovaquone Naprosyn was also prescribed to treat IRIS.  Steroids was not chosen because of risk for other opportunistic infections.  When I went to see him in the ED patient had received morphine and he was sleeping.  He then got up and was fully conversant and had a good meal. He had 1 episode of loose stool yesterday Otherwise no abdominal pain or diarrhea or bleeding.  He says he has been doing crystal meth until up to a week ago He has had sexual encounters  In the ED vitals normal  07/25/22  BP 112/61  Temp 98.5 F (36.9 C)  Pulse Rate 79  Resp 15  SpO2 97 %   Labs  Latest Reference Range & Units 07/25/22  WBC 4.0 - 10.5 K/uL 11.3 (H)  Hemoglobin 13.0 - 17.0 g/dL 11.1 (L)  HCT 39.0 - 52.0 % 34.8 (L)  Platelets 150 - 400 K/uL 641 (H)  Creatinine 0.61 - 1.24 mg/dL 1.23    Past Medical History:  Diagnosis Date   HIV (human immunodeficiency virus infection) ([place])    Schizophrenia ([place])    per IVC paperwork, pt states does not have this dx    Past Surgical History:  Procedure Laterality Date   IR US GUIDE BX ASP/DRAIN  03/21/2022    Social History   Socioeconomic History   Marital status: Single    Spouse name: Not on file   Number of children: Not on file   Years of education: Not on file   Highest education level: Not on file  Occupational History   Not on file  Tobacco Use   Smoking status: Former    Types: Cigarettes   Smokeless tobacco: Never   Tobacco comments:    Patient denies using tobacco products (03/13/22)  Substance and Sexual Activity   Alcohol use: Yes    Comment: occasional   Drug use: Yes    Types: Methamphetamines, Marijuana, Cocaine    Comment: states occasional marijuana and meth use   Sexual  activity: Not Currently    Comment: condoms accepted  Other Topics Concern   Not on file  Social History Narrative   Not on file   Social Determinants of Health   Financial Resource Strain: Not on file  Food Insecurity: No Food Insecurity (06/25/2022)   Hunger Vital Sign    Worried About Running Out of Food in the Last Year: Never true    Ran Out of Food in the Last Year: Never true  Transportation Needs: No Transportation Needs (06/25/2022)   PRAPARE - Hydrologist (Medical): No    Lack of Transportation (Non-Medical): No  Physical Activity: Not on file  Stress: Not on file  Social Connections: Not on file  Intimate Partner Violence: Not At Risk (06/25/2022)   Humiliation, Afraid, Rape, and  Kick questionnaire    Fear of Current or Ex-Partner: No    Emotionally Abused: No    Physically Abused: No    Sexually Abused: No    No family history on file. Allergies  Allergen Reactions   Bactrim [Sulfamethoxazole-Trimethoprim] Anaphylaxis   I? Current Facility-Administered Medications  Medication Dose Route Frequency Provider Last Rate Last Admin   atovaquone (MEPRON) 750 MG/5ML suspension 1,500 mg  1,500 mg Oral Daily Blake Divine, MD   1,500 mg at 07/25/22 1313   [START ON 07/26/2022] cefTRIAXone (ROCEPHIN) 2 g in sodium chloride 0.9 % 100 mL IVPB  2 g Intravenous Q24H Dolan, Carissa E, RPH       dolutegravir (TIVICAY) tablet 50 mg  50 mg Oral Daily Blake Divine, MD   50 mg at 07/25/22 1315   emtricitabine-tenofovir AF (DESCOVY) 200-25 MG per tablet 1 tablet  1 tablet Oral Daily Blake Divine, MD   1 tablet at 07/25/22 1315   [START ON 07/26/2022] ethambutol (MYAMBUTOL) tablet 1,200 mg  1,200 mg Oral Daily Dorothe Pea, RPH       [START ON 07/26/2022] rifabutin (MYCOBUTIN) capsule 300 mg  300 mg Oral Daily Dorothe Pea, [facility]       Current Outpatient Medications  Medication Sig Dispense Refill   amoxicillin-clavulanate (AUGMENTIN) 875-125 MG tablet  Take 1 tablet by mouth 2 (two) times daily for 7 days. 14 tablet 0   atovaquone (MEPRON) 750 MG/5ML suspension Take 10 mLs (1,500 mg total) by mouth daily. 300 mL 3   azithromycin (ZITHROMAX) 500 MG tablet Take 1 tablet (500 mg total) by mouth daily. 30 tablet 3   dolutegravir (TIVICAY) 50 MG tablet Take 1 tablet (50 mg total) by mouth daily. 30 tablet 3   emtricitabine-tenofovir AF (DESCOVY) 200-25 MG tablet Take 1 tablet by mouth daily. 30 tablet 3   ethambutol (MYAMBUTOL) 400 MG tablet Take 3 tablets (1,200 mg total) by mouth daily. 90 tablet 3   naproxen (NAPROSYN) 250 MG tablet Take 1 tablet (250 mg total) by mouth 2 (two) times daily with a meal. 60 tablet 0   rifabutin (MYCOBUTIN) 150 MG capsule Take 2 capsules (300 mg total) by mouth daily. 60 capsule 3   benztropine (COGENTIN) 1 MG tablet Take 1 tablet (1 mg total) by mouth daily. 30 tablet 0   chlorhexidine (PERIDEX) 0.12 % solution SMARTSIG:By Mouth (Patient not taking: Reported on 07/25/2022)     gabapentin (NEURONTIN) 300 MG capsule Take 1 capsule (300 mg total) by mouth 3 (three) times daily. (Patient not taking: Reported on 07/25/2022) 90 capsule 0   haloperidol (HALDOL) 5 MG tablet Take 1 tablet (5 mg total) by mouth 2 (two) times daily. (Patient not taking: Reported on 07/25/2022) 60 tablet 0   traZODone (DESYREL) 150 MG tablet Take 1 tablet (150 mg total) by mouth at bedtime. 30 tablet 0     Abtx:  Anti-infectives (From admission, onward)    Start     Dose/Rate Route Frequency Ordered Stop   07/26/22 1230  cefTRIAXone (ROCEPHIN) 2 g in sodium chloride 0.9 % 100 mL IVPB       See Hyperspace for full Linked Orders Report.   2 g 200 mL/hr over 30 Minutes Intravenous Every 24 hours 07/25/22 1226     07/26/22 1000  ethambutol (MYAMBUTOL) tablet 1,200 mg        1,200 mg Oral Daily 07/25/22 1231     07/26/22 1000  rifabutin (MYCOBUTIN) capsule 300 mg  300 mg Oral Daily 07/25/22 1231     07/25/22 2100  cefTRIAXone (ROCEPHIN) 1 g  in sodium chloride 0.9 % 100 mL IVPB  Status:  Discontinued        1 g 200 mL/hr over 30 Minutes Intravenous Every 12 hours 07/25/22 1213 07/25/22 1226   07/25/22 1300  atovaquone (MEPRON) 750 MG/5ML suspension 1,500 mg        1,500 mg Oral Daily 07/25/22 1213     07/25/22 1300  dolutegravir (TIVICAY) tablet 50 mg        50 mg Oral Daily 07/25/22 1213     07/25/22 1300  emtricitabine-tenofovir AF (DESCOVY) 200-25 MG per tablet 1 tablet        1 tablet Oral Daily 07/25/22 1213     07/25/22 1300  ethambutol (MYAMBUTOL) tablet 1,200 mg  Status:  Discontinued        1,200 mg Oral Daily 07/25/22 1213 07/25/22 1231   07/25/22 1300  rifabutin (MYCOBUTIN) capsule 300 mg  Status:  Discontinued        300 mg Oral Daily 07/25/22 1213 07/25/22 1231   07/25/22 1230  cefTRIAXone (ROCEPHIN) 1 g in sodium chloride 0.9 % 100 mL IVPB       See Hyperspace for full Linked Orders Report.   1 g 200 mL/hr over 30 Minutes Intravenous  Once 07/25/22 1226 07/25/22 1312   07/25/22 1100  ethambutol (MYAMBUTOL) tablet 1,200 mg        1,200 mg Oral  Once 07/25/22 1014 07/25/22 1118   07/25/22 1100  rifabutin (MYCOBUTIN) capsule 300 mg        300 mg Oral  Once 07/25/22 1014 07/25/22 1118   07/25/22 1030  cefTRIAXone (ROCEPHIN) 1 g in sodium chloride 0.9 % 100 mL IVPB        1 g 200 mL/hr over 30 Minutes Intravenous  Once 07/25/22 1019 07/25/22 1054   07/25/22 1015  azithromycin (ZITHROMAX) 500 mg in sodium chloride 0.9 % 250 mL IVPB        500 mg 250 mL/hr over 60 Minutes Intravenous  Once 07/25/22 1014 07/25/22 1158       REVIEW OF SYSTEMS:  Const: negative fever, negative chills, negative weight loss Eyes: negative diplopia or visual changes, negative eye pain ENT: negative coryza, negative sore throat Painful draining swelling left side of the neck Resp: negative cough, hemoptysis, dyspnea Cards: negative for chest pain, palpitations, lower extremity edema GU: negative for frequency, dysuria and  hematuria GI: Negative for abdominal pain, diarrhea, bleeding, constipation Skin: negative for rash and pruritus Heme: negative for easy bruising and gum/nose bleeding MS: negative for myalgias, arthralgias, back pain and muscle weakness Neurolo:negative for headaches, dizziness, vertigo, memory problems  Psych: psychosis Endocrine: negative for thyroid, diabetes Allergy/Immunology- bactrim Objective:  VITALS:  BP 116/60 (BP Location: Right Arm)   Pulse 79   Temp 98.5 F (36.9 C) (Oral)   Resp 15   Ht 6\' 5"  (1.956 m)   Wt 93 kg   SpO2 97%   BMI 24.31 kg/m   PHYSICAL EXAM:  General: was somnolent  and then woke up and was oriented X5.  Head: Normocephalic, without obvious abnormality, atraumatic. Eyes: Conjunctivae clear, anicteric sclerae. Pupils are equal ENT Nares normal. No drainage or sinus tenderness. Lips, mucosa, and tongue normal. ( 3 papules on the tongue) No Thrush Neck: Supple, left sided cervical adenopathy, matted lymphnodes, 2 draining wounds like scrofula   Lungs: Clear to auscultation bilaterally. No Wheezing or  Rhonchi. No rales. Heart: Regular rate and rhythm, no murmur, rub or gallop. Abdomen: Soft, non-tender,not distended. Bowel sounds normal. No masses Extremities: atraumatic, no cyanosis. No edema. No clubbing Skin: No rashes or lesions. Or bruising Lymph: Cervical, supraclavicular normal. Neurologic: Grossly non-focal Pertinent Labs Lab Results CBC    Component Value Date/Time   WBC 11.3 (H) 07/25/2022 0828   RBC 4.44 07/25/2022 0828   HGB 11.1 (L) 07/25/2022 0828   HGB 9.6 (L) 06/27/2022 0428   HCT 34.8 (L) 07/25/2022 0828   HCT 29.3 (L) 06/27/2022 0428   PLT 641 (H) 07/25/2022 0828   PLT 323 06/27/2022 0428   MCV 78.4 (L) 07/25/2022 0828   MCV 77 (L) 06/27/2022 0428   MCH 25.0 (L) 07/25/2022 0828   MCHC 31.9 07/25/2022 0828   RDW 18.6 (H) 07/25/2022 0828   RDW 16.9 (H) 06/27/2022 0428   LYMPHSABS 3.3 07/03/2022 2213   LYMPHSABS 1.9  06/27/2022 0428   MONOABS 1.7 (H) 07/03/2022 2213   EOSABS 0.1 07/03/2022 2213   EOSABS 0.1 06/27/2022 0428   BASOSABS 0.0 07/03/2022 2213   BASOSABS 0.0 06/27/2022 0428       Latest Ref Rng & Units 07/25/2022    8:28 AM 07/03/2022   10:13 PM 06/29/2022    3:43 AM  CMP  Glucose 70 - 99 mg/dL 114  98  88   BUN 6 - 20 mg/dL 27  15  8    Creatinine 0.61 - 1.24 mg/dL 1.23  1.47  0.81   Sodium 135 - 145 mmol/L 136  136  135   Potassium 3.5 - 5.1 mmol/L 3.6  3.7  4.0   Chloride 98 - 111 mmol/L 104  103  106   CO2 22 - 32 mmol/L 23  29  24    Calcium 8.9 - 10.3 mg/dL 9.3  8.6  8.2   Total Protein 6.5 - 8.1 g/dL 9.5  9.2    Total Bilirubin 0.3 - 1.2 mg/dL 0.8  0.4    Alkaline Phos 38 - 126 U/L 120  126    AST 15 - 41 U/L 50  22    ALT 0 - 44 U/L 28  15        Microbiology: Blood culture sent   IMAGING RESULTS: CT neck  I have personally reviewed the films Poor image quality due to significant motion artifact. Decreased size of the abscess along the posterior margin of the left sternocleidomastoid on the left paraspinal musculature.  Decreased size of the necrotic left level 2 lymph node.. unchanged left posterior triangle and possibly necrotic precarinal lymph nodes   Impression/Recommendation AIDS.  Patient is on dolutegravir, TAF+FTC Fluctuating adherence. June 26, 2022 viral load was? 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 Neg e antigen and e antibody. 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 - very likely the latter- has been in [place] and followed by psychiatrist while in patient  PT lives with his mother but does not want to and that has led to friction and also because of his psychotic behavior he has been under IVC many times in the past  H/o MSSA cervical spine infeciton with fusion in 2021   ___________________________________________________ Discussed with patient, requesting provider PT is waiting for transfer to [facility] to be seen by ENT Note:  This document was prepared using Dragon voice recognition software and may include unintentional dictation errors.

## 2022-07-26 DIAGNOSIS — F332 Major depressive disorder, recurrent severe without psychotic features: Secondary | ICD-10-CM

## 2022-07-26 DIAGNOSIS — A310 Pulmonary mycobacterial infection: Secondary | ICD-10-CM | POA: Diagnosis not present

## 2022-07-26 LAB — URINE DRUG SCREEN, QUALITATIVE (ARMC ONLY)
Amphetamines, Ur Screen: POSITIVE — AB
Barbiturates, Ur Screen: NOT DETECTED
Benzodiazepine, Ur Scrn: NOT DETECTED
Cannabinoid 50 Ng, Ur ~~LOC~~: NOT DETECTED
Cocaine Metabolite,Ur ~~LOC~~: NOT DETECTED
MDMA (Ecstasy)Ur Screen: NOT DETECTED
Methadone Scn, Ur: NOT DETECTED
Opiate, Ur Screen: POSITIVE — AB
Phencyclidine (PCP) Ur S: NOT DETECTED
Tricyclic, Ur Screen: NOT DETECTED

## 2022-07-26 MED ORDER — BENZTROPINE MESYLATE 1 MG PO TABS
1.0000 mg | ORAL_TABLET | Freq: Every day | ORAL | Status: DC
  Administered 2022-07-27 – 2022-07-28 (×2): 1 mg via ORAL
  Filled 2022-07-26 (×3): qty 1

## 2022-07-26 MED ORDER — ACETAMINOPHEN 500 MG PO TABS
1000.0000 mg | ORAL_TABLET | Freq: Once | ORAL | Status: AC
  Administered 2022-07-26: 1000 mg via ORAL
  Filled 2022-07-26: qty 2

## 2022-07-26 MED ORDER — MORPHINE SULFATE (PF) 4 MG/ML IV SOLN
4.0000 mg | Freq: Once | INTRAVENOUS | Status: AC
  Administered 2022-07-26: 4 mg via INTRAVENOUS
  Filled 2022-07-26: qty 1

## 2022-07-26 MED ORDER — SODIUM CHLORIDE 0.9 % IV SOLN: INTRAVENOUS | Status: AC

## 2022-07-26 MED ORDER — KETOROLAC TROMETHAMINE 15 MG/ML IJ SOLN
15.0000 mg | Freq: Once | INTRAMUSCULAR | Status: AC
  Administered 2022-07-26: 15 mg via INTRAVENOUS
  Filled 2022-07-26: qty 1

## 2022-07-26 MED ORDER — HALOPERIDOL 5 MG PO TABS
5.0000 mg | ORAL_TABLET | Freq: Two times a day (BID) | ORAL | Status: DC
  Administered 2022-07-27 – 2022-07-28 (×2): 5 mg via ORAL
  Filled 2022-07-26 (×4): qty 1

## 2022-07-26 NOTE — ED Notes (Signed)
Answered pt's call light. Pt asking for 2 grape juices and a boxed lunch. Rn Provided both. Emptied pt's urinal. Updated pt on awaiting bed at [facility].

## 2022-07-26 NOTE — ED Notes (Signed)
Called and spoke with Cherise at [facility] transfer . Pt remains on waitlist for room assignment

## 2022-07-26 NOTE — ED Notes (Signed)
Patient given another grape juice per request.

## 2022-07-26 NOTE — ED Notes (Signed)
Patient given grape juice and sandwich tray per request. No distress noted at this time.

## 2022-07-26 NOTE — ED Notes (Signed)
Called  [facility] transfer  center  pt still on  waitlist  for  bed  informed Dr  Jari Pigg MD and  Aida Puffer  RN

## 2022-07-26 NOTE — ED Notes (Signed)
Patient given sandwich box and grape juice per request.

## 2022-07-26 NOTE — ED Notes (Signed)
Answered pt's call light. Pt asked for his neck to be covered. Placed xeroform and 2x2s and secured with tape.

## 2022-07-26 NOTE — ED Notes (Signed)
Rebandaged neck wounds.

## 2022-07-26 NOTE — ED Notes (Signed)
Patient continues to remove monitoring equipment, not wanting to have it on.

## 2022-07-26 NOTE — ED Notes (Signed)
Patient given saltine crackers per request.

## 2022-07-26 NOTE — ED Notes (Signed)
Pt took off all monitors and removed bandages from neck. RN informed the patient that the wounds need to be covered to help prevent infection. Pt states understanding. Pt asking for pain medication. Provider notified.

## 2022-07-26 NOTE — ED Notes (Signed)
Answered pt's call light. Gave pt bath wipes per his request. Pt denies further needs at this time.

## 2022-07-26 NOTE — Consult Note (Signed)
[facility] Face-to-Face Psychiatry Consult   Reason for Consult:Neck Pain  Referring Physician: Dr. Charna Archer Patient Identification: Michael Chandler MRN:  [id number] Principal Diagnosis: <principal problem not specified> Diagnosis:  Active Problems:   Psychosis   AIDS (acquired immune deficiency syndrome)   Major depressive disorder, recurrent severe without psychotic features   Syphilis   Chronic viral hepatitis B without delta-agent   Discitis of cervical region   Cellulitis and abscess of neck   AKI (acute kidney injury)   Chronic anemia   Amphetamine dependence   Total Time spent with patient: 1 hour  Subjective: "I don't need no medicines. I use to take valium."  Michael Chandler is a 37 y.o. male patient presented voluntarily to the [facility] ED with complaints of left-sided neck pain persisting for an unspecified duration. Upon psychiatric consultation by Dr. Nickolas Madrid, it was recommended to initiate a psychiatric medication regime. Collaborated with Dr. Nickolas Madrid regarding patient care. Decision made that patient does not meet criteria for psychiatric inpatient admission. Transfer arranged for further medical care at [facility].   However, the patient is refusing medication and specifically requested Valium, which was declined. The patient expressed reluctance to accept any medication from the provider. The patient is alert and oriented x 4, irritated but cooperative. His mood-congruent with affect, he is not responding to internal or external stimuli. The patient has no focal deficits noted. The patient denies auditory or visual hallucinations, suicidal, homicidal, or self-harm ideations. He is not presenting with psychotic or paranoid behaviors. The patient is able to answer questions appropriately. The patient declined medication. The patient will continue to monitor for any changes in mental status. No acute psychiatric intervention required at this time. Transfer to [facility] hospital: Patient deemed not  meeting criteria for psychiatric inpatient admission. Disposition: Transfer to [facility] hospital in the morning for further medical evaluation and management.  HPI: Per Dr. Charna Archer, Michael Chandler is a 37 y.o. male with past medical history of HIV/AIDS, schizophrenia, polysubstance abuse, and cervical MAI infection who presents to the ED complaining of neck pain.  Patient reports that he has been dealing with increasing pain and swelling to the left side of his neck due to ongoing infection.  He states he has been compliant with antibiotic regimen previously prescribed by infectious disease for MAI, with his last dose coming last night.  He denies any fevers and has had not had any difficulty swallowing or difficulty breathing.  He states a new wound has opened up on the side of his neck and has been draining pus at times.   Past Psychiatric History:  Schizophrenia ([place])  Risk to Self:   Risk to Others:   Prior Inpatient Therapy:   Prior Outpatient Therapy:    Past Medical History:  Past Medical History:  Diagnosis Date   HIV (human immunodeficiency virus infection) ([place])    Schizophrenia ([place])    per IVC paperwork, pt states does not have this dx    Past Surgical History:  Procedure Laterality Date   IR US GUIDE BX ASP/DRAIN  03/21/2022   Family History: No family history on file. Family Psychiatric  History: History reviewed. No pertinent family psychiatric history. Social History:  Social History   Substance and Sexual Activity  Alcohol Use Yes   Comment: occasional     Social History   Substance and Sexual Activity  Drug Use Yes   Types: Methamphetamines, Marijuana, Cocaine   Comment: states occasional marijuana and meth use    Social History  Socioeconomic History   Marital status: Single    Spouse name: Not on file   Number of children: Not on file   Years of education: Not on file   Highest education level: Not on file  Occupational History   Not on file  Tobacco Use    Smoking status: Former    Types: Cigarettes   Smokeless tobacco: Never   Tobacco comments:    Patient denies using tobacco products (03/13/22)  Substance and Sexual Activity   Alcohol use: Yes    Comment: occasional   Drug use: Yes    Types: Methamphetamines, Marijuana, Cocaine    Comment: states occasional marijuana and meth use   Sexual activity: Not Currently    Comment: condoms accepted  Other Topics Concern   Not on file  Social History Narrative   Not on file   Social Determinants of Health   Financial Resource Strain: Not on file  Food Insecurity: No Food Insecurity (06/25/2022)   Hunger Vital Sign    Worried About Running Out of Food in the Last Year: Never true    Ran Out of Food in the Last Year: Never true  Transportation Needs: No Transportation Needs (06/25/2022)   PRAPARE - Hydrologist (Medical): No    Lack of Transportation (Non-Medical): No  Physical Activity: Not on file  Stress: Not on file  Social Connections: Not on file   Additional Social History:    Allergies:   Allergies  Allergen Reactions   Bactrim [Sulfamethoxazole-Trimethoprim] Anaphylaxis    Labs:  Results for orders placed or performed during the hospital encounter of 07/25/22 (from the past 48 hour(s))  CBC     Status: Abnormal   Collection Time: 07/25/22  8:28 AM  Result Value Ref Range   WBC 11.3 (H) 4.0 - 10.5 K/uL   RBC 4.44 4.22 - 5.81 MIL/uL   Hemoglobin 11.1 (L) 13.0 - 17.0 g/dL   HCT 34.8 (L) 39.0 - 52.0 %   MCV 78.4 (L) 80.0 - 100.0 fL   MCH 25.0 (L) 26.0 - 34.0 pg   MCHC 31.9 30.0 - 36.0 g/dL   RDW 18.6 (H) 11.5 - 15.5 %   Platelets 641 (H) 150 - 400 K/uL   nRBC 0.0 0.0 - 0.2 %    Comment: Performed at [facility], [address] [place], [place] [address]  Comprehensive metabolic panel     Status: Abnormal   Collection Time: 07/25/22  8:28 AM  Result Value Ref Range   Sodium 136 135 - 145 mmol/L   Potassium 3.6 3.5 - 5.1  mmol/L   Chloride 104 98 - 111 mmol/L   CO2 23 22 - 32 mmol/L   Glucose, Bld 114 (H) 70 - 99 mg/dL    Comment: Glucose reference range applies only to samples taken after fasting for at least 8 hours.   BUN 27 (H) 6 - 20 mg/dL   Creatinine, Ser 1.23 0.61 - 1.24 mg/dL   Calcium 9.3 8.9 - 10.3 mg/dL   Total Protein 9.5 (H) 6.5 - 8.1 g/dL   Albumin 3.8 3.5 - 5.0 g/dL   AST 50 (H) 15 - 41 U/L   ALT 28 0 - 44 U/L   Alkaline Phosphatase 120 38 - 126 U/L   Total Bilirubin 0.8 0.3 - 1.2 mg/dL   GFR, Estimated >60 >60 mL/min    Comment: (NOTE) Calculated using the CKD-EPI Creatinine Equation (2021)    Anion gap 9  5 - 15    Comment: Performed at [facility], [address] [place], [place] [address]  Lactic acid, plasma     Status: Abnormal   Collection Time: 07/25/22  8:28 AM  Result Value Ref Range   Lactic Acid, Venous 2.4 (HH) 0.5 - 1.9 mmol/L    Comment: CRITICAL RESULT CALLED TO, READ BACK BY AND VERIFIED WITH BRIANNA ALLEY RN [phone] 07/25/22 HNM Performed at [facility], [address] [place], [place] [address]   Culture, blood (routine x 2)     Status: None (Preliminary result)   Collection Time: 07/25/22  9:08 AM   Specimen: BLOOD RIGHT ARM  Result Value Ref Range   Specimen Description BLOOD RIGHT ARM    Special Requests      BOTTLES DRAWN AEROBIC AND ANAEROBIC Blood Culture adequate volume   Culture      NO GROWTH < 24 HOURS Performed at [facility], [address] [place], [place] [address]    Report Status PENDING   Culture, blood (routine x 2)     Status: None (Preliminary result)   Collection Time: 07/25/22  9:08 AM   Specimen: BLOOD LEFT ARM  Result Value Ref Range   Specimen Description BLOOD LEFT ARM    Special Requests      BOTTLES DRAWN AEROBIC AND ANAEROBIC Blood Culture results may not be optimal due to an excessive volume of blood received in culture bottles   Culture      NO GROWTH < 24 HOURS Performed at [facility], [address] [place], [place] [address]    Report Status PENDING   Lactic acid, plasma     Status: None   Collection Time: 07/25/22 10:55 AM  Result Value Ref Range   Lactic Acid, Venous 1.7 0.5 - 1.9 mmol/L    Comment: Performed at [facility], [address] [place], [place] [address]  Urine Drug Screen, Qualitative ([place] only)     Status: Abnormal   Collection Time: 07/26/22  4:55 AM  Result Value Ref Range   Tricyclic, Ur Screen NONE DETECTED NONE DETECTED   Amphetamines, Ur Screen POSITIVE (A) NONE DETECTED   MDMA (Ecstasy)Ur Screen NONE DETECTED NONE DETECTED   Cocaine Metabolite,Ur Ages NONE DETECTED NONE DETECTED   Opiate, Ur Screen POSITIVE (A) NONE DETECTED   Phencyclidine (PCP) Ur S NONE DETECTED NONE DETECTED   Cannabinoid 50 Ng, Ur [place] NONE DETECTED NONE DETECTED   Barbiturates, Ur Screen NONE DETECTED NONE DETECTED   Benzodiazepine, Ur Scrn NONE DETECTED NONE DETECTED   Methadone Scn, Ur NONE DETECTED NONE DETECTED    Comment: (NOTE) Tricyclics + metabolites, urine    Cutoff 1000 ng/mL Amphetamines + metabolites, urine  Cutoff 1000 ng/mL MDMA (Ecstasy), urine              Cutoff 500 ng/mL Cocaine Metabolite, urine          Cutoff 300 ng/mL Opiate + metabolites, urine        Cutoff 300 ng/mL Phencyclidine (PCP), urine         Cutoff 25 ng/mL Cannabinoid, urine                 Cutoff 50 ng/mL Barbiturates + metabolites, urine  Cutoff 200 ng/mL Benzodiazepine, urine              Cutoff 200 ng/mL Methadone, urine  Cutoff 300 ng/mL  The urine drug screen provides only a preliminary, unconfirmed analytical test result and should not be used for non-medical purposes. Clinical consideration and professional judgment should be applied to any positive drug screen result due to possible interfering substances. A more specific alternate chemical method must be used in order to obtain a confirmed analytical result. Gas chromatography  / mass spectrometry (GC/MS) is the preferred confirm atory method. Performed at [facility], [address] [place], [place] [address]     Current Facility-Administered Medications  Medication Dose Route Frequency Provider Last Rate Last Admin   atovaquone (MEPRON) 750 MG/5ML suspension 1,500 mg  1,500 mg Oral Daily Blake Divine, MD   1,500 mg at 07/26/22 1018   azithromycin (ZITHROMAX) tablet 500 mg  500 mg Oral Daily Tsosie Billing, MD   500 mg at 07/26/22 1018   benztropine (COGENTIN) tablet 1 mg  1 mg Oral Daily Vanessa [place], MD       cefTRIAXone (ROCEPHIN) 2 g in sodium chloride 0.9 % 100 mL IVPB  2 g Intravenous Q24H Dorothe Pea, RPH   Stopped at 07/26/22 1310   dolutegravir (TIVICAY) tablet 50 mg  50 mg Oral Daily Blake Divine, MD   50 mg at 07/26/22 1018   emtricitabine-tenofovir AF (DESCOVY) 200-25 MG per tablet 1 tablet  1 tablet Oral Daily Blake Divine, MD   1 tablet at 07/26/22 1018   ethambutol (MYAMBUTOL) tablet 1,200 mg  1,200 mg Oral Daily Dorothe Pea, RPH   1,200 mg at 07/26/22 1018   haloperidol (HALDOL) tablet 5 mg  5 mg Oral BID Vanessa [place], MD       rifabutin [facility]) capsule 300 mg  300 mg Oral Daily Dorothe Pea, RPH   300 mg at 07/26/22 1019   Current Outpatient Medications  Medication Sig Dispense Refill   amoxicillin-clavulanate (AUGMENTIN) 875-125 MG tablet Take 1 tablet by mouth 2 (two) times daily for 7 days. 14 tablet 0   atovaquone (MEPRON) 750 MG/5ML suspension Take 10 mLs (1,500 mg total) by mouth daily. 300 mL 3   azithromycin (ZITHROMAX) 500 MG tablet Take 1 tablet (500 mg total) by mouth daily. 30 tablet 3   dolutegravir (TIVICAY) 50 MG tablet Take 1 tablet (50 mg total) by mouth daily. 30 tablet 3   emtricitabine-tenofovir AF (DESCOVY) 200-25 MG tablet Take 1 tablet by mouth daily. 30 tablet 3   ethambutol (MYAMBUTOL) 400 MG tablet Take 3 tablets (1,200 mg total) by mouth daily. 90 tablet 3   naproxen  (NAPROSYN) 250 MG tablet Take 1 tablet (250 mg total) by mouth 2 (two) times daily with a meal. 60 tablet 0   rifabutin (MYCOBUTIN) 150 MG capsule Take 2 capsules (300 mg total) by mouth daily. 60 capsule 3   benztropine (COGENTIN) 1 MG tablet Take 1 tablet (1 mg total) by mouth daily. 30 tablet 0   chlorhexidine (PERIDEX) 0.12 % solution SMARTSIG:By Mouth (Patient not taking: Reported on 07/25/2022)     gabapentin (NEURONTIN) 300 MG capsule Take 1 capsule (300 mg total) by mouth 3 (three) times daily. (Patient not taking: Reported on 07/25/2022) 90 capsule 0   haloperidol (HALDOL) 5 MG tablet Take 1 tablet (5 mg total) by mouth 2 (two) times daily. (Patient not taking: Reported on 07/25/2022) 60 tablet 0   traZODone (DESYREL) 150 MG tablet Take 1 tablet (150 mg total) by mouth at bedtime. 30 tablet 0    Musculoskeletal: Strength & Muscle  Tone: within normal limits Gait & Station: normal Patient leans: N/A Psychiatric Specialty Exam:  Presentation  General Appearance:  Bizarre  Eye Contact: Minimal  Speech: Clear and Coherent  Speech Volume: Increased  Handedness: Right   Mood and Affect  Mood: Irritable  Affect: Congruent   Thought Process  Thought Processes: Disorganized; Irrevelant  Descriptions of Associations:Loose  Orientation:Full (Time, Place and Person)  Thought Content:Illogical  History of Schizophrenia/Schizoaffective disorder:Yes  Duration of Psychotic Symptoms:Greater than six months  Hallucinations:Hallucinations: None  Ideas of Reference:Paranoia  Suicidal Thoughts:Suicidal Thoughts: No  Homicidal Thoughts:Homicidal Thoughts: No   Sensorium  Memory: Immediate Fair; Recent Fair; Remote Fair  Judgment: Poor  Insight: Poor   Executive Functions  Concentration: Fair  Attention Span: Fair  Recall: [place] of Knowledge: Fair  Language: Fair   Psychomotor Activity  Psychomotor Activity: Psychomotor Activity:  Normal   Assets  Assets: Desire for Improvement; Financial Resources/Insurance; Resilience; Social Support   Sleep  Sleep: Sleep: Fair   Physical Exam: Physical Exam Vitals and nursing note reviewed.  Constitutional:      Appearance: Normal appearance. He is normal weight.  HENT:     Head: Normocephalic.     Right Ear: External ear normal.     Left Ear: External ear normal.     Nose: Nose normal.     Mouth/Throat:     Mouth: Mucous membranes are moist.  Cardiovascular:     Rate and Rhythm: Normal rate.     Pulses: Normal pulses.  Pulmonary:     Effort: Pulmonary effort is normal.  Musculoskeletal:        General: Normal range of motion.     Cervical back: Normal range of motion and neck supple.  Neurological:     Mental Status: He is alert and oriented to person, place, and time. Mental status is at baseline.  Psychiatric:        Attention and Perception: Attention and perception normal.        Mood and Affect: Mood is anxious and depressed. Affect is inappropriate.        Speech: Speech is tangential.        Behavior: Behavior is uncooperative and agitated.        Thought Content: Thought content is paranoid.        Cognition and Memory: Cognition is impaired.        Judgment: Judgment is impulsive and inappropriate.    Review of Systems  Psychiatric/Behavioral:  Positive for depression. The patient is nervous/anxious.   All other systems reviewed and are negative.  Blood pressure 136/84, pulse 83, temperature 98.8 F (37.1 C), temperature source Oral, resp. rate 18, height 6\' 5"  (1.956 m), weight 93 kg, SpO2 100 %. Body mass index is 24.31 kg/m.  Treatment Plan Summary: Medication management and Plan   The patient is not a safety risk to himself or others and does not require psychiatric inpatient admission for stabilization and treatment.  Disposition: No evidence of imminent risk to self or others at present.   Supportive therapy provided about ongoing  stressors.  Caroline Sauger, NP 07/26/2022 11:04 PM

## 2022-07-26 NOTE — ED Notes (Signed)
Called  [facility]  transfer  center pt  still on  waitlist for  bed  informed  Dr Jari Pigg  MD

## 2022-07-26 NOTE — ED Notes (Signed)
Gave pt 2 grape juices and 2 apple sauces per his request. Pt asked for oranges. Called dietary and they will bring some up. Reattached ECG, BP and pulse ox. Pt resting comfortably in bed.

## 2022-07-27 DIAGNOSIS — L0211 Cutaneous abscess of neck: Secondary | ICD-10-CM

## 2022-07-27 DIAGNOSIS — L03221 Cellulitis of neck: Secondary | ICD-10-CM

## 2022-07-27 DIAGNOSIS — F1520 Other stimulant dependence, uncomplicated: Secondary | ICD-10-CM | POA: Diagnosis not present

## 2022-07-27 DIAGNOSIS — A310 Pulmonary mycobacterial infection: Secondary | ICD-10-CM | POA: Diagnosis not present

## 2022-07-27 DIAGNOSIS — B20 Human immunodeficiency virus [HIV] disease: Secondary | ICD-10-CM | POA: Diagnosis not present

## 2022-07-27 LAB — T-HELPER CELLS CD4/CD8 %
% CD 4 Pos. Lymph.: 12.9 % — ABNORMAL LOW (ref 30.8–58.5)
Absolute CD 4 Helper: 232 /uL — ABNORMAL LOW (ref 359–1519)
Basophils Absolute: 0 10*3/uL (ref 0.0–0.2)
Basos: 0 %
CD3+CD4+ Cells/CD3+CD8+ Cells Bld: 0.2 — ABNORMAL LOW (ref 0.92–3.72)
CD3+CD8+ Cells # Bld: 1152 /uL — ABNORMAL HIGH (ref 109–897)
CD3+CD8+ Cells NFr Bld: 64 % — ABNORMAL HIGH (ref 12.0–35.5)
EOS (ABSOLUTE): 0.2 10*3/uL (ref 0.0–0.4)
Eos: 3 %
Hematocrit: 33.4 % — ABNORMAL LOW (ref 37.5–51.0)
Hemoglobin: 10.3 g/dL — ABNORMAL LOW (ref 13.0–17.7)
Immature Grans (Abs): 0 10*3/uL (ref 0.0–0.1)
Immature Granulocytes: 0 %
Lymphocytes Absolute: 1.8 10*3/uL (ref 0.7–3.1)
Lymphs: 30 %
MCH: 24.3 pg — ABNORMAL LOW (ref 26.6–33.0)
MCHC: 30.8 g/dL — ABNORMAL LOW (ref 31.5–35.7)
MCV: 79 fL (ref 79–97)
Monocytes Absolute: 0.7 10*3/uL (ref 0.1–0.9)
Monocytes: 12 %
Neutrophils Absolute: 3.2 10*3/uL (ref 1.4–7.0)
Neutrophils: 55 %
Platelets: 530 10*3/uL — ABNORMAL HIGH (ref 150–450)
RBC: 4.24 x10E6/uL (ref 4.14–5.80)
RDW: 17.9 % — ABNORMAL HIGH (ref 11.6–15.4)
WBC: 6 10*3/uL (ref 3.4–10.8)

## 2022-07-27 LAB — COMPREHENSIVE METABOLIC PANEL
ALT: 15 U/L (ref 0–44)
AST: 19 U/L (ref 15–41)
Albumin: 2.8 g/dL — ABNORMAL LOW (ref 3.5–5.0)
Alkaline Phosphatase: 104 U/L (ref 38–126)
Anion gap: 7 (ref 5–15)
BUN: 12 mg/dL (ref 6–20)
CO2: 24 mmol/L (ref 22–32)
Calcium: 8.2 mg/dL — ABNORMAL LOW (ref 8.9–10.3)
Chloride: 103 mmol/L (ref 98–111)
Creatinine, Ser: 0.9 mg/dL (ref 0.61–1.24)
GFR, Estimated: >60 mL/min (ref >60)
Glucose, Bld: 87 mg/dL (ref 70–99)
Potassium: 3.6 mmol/L (ref 3.5–5.1)
Sodium: 134 mmol/L — ABNORMAL LOW (ref 135–145)
Total Bilirubin: 0.5 mg/dL (ref 0.3–1.2)
Total Protein: 7.2 g/dL (ref 6.5–8.1)

## 2022-07-27 LAB — CBC WITH DIFFERENTIAL/PLATELET
Abs Immature Granulocytes: 0.04 10*3/uL (ref 0.00–0.07)
Basophils Absolute: 0 10*3/uL (ref 0.0–0.1)
Basophils Relative: 0 %
Eosinophils Absolute: 0.2 10*3/uL (ref 0.0–0.5)
Eosinophils Relative: 1 %
HCT: 29.9 % — ABNORMAL LOW (ref 39.0–52.0)
Hemoglobin: 9.5 g/dL — ABNORMAL LOW (ref 13.0–17.0)
Immature Granulocytes: 0 %
Lymphocytes Relative: 20 %
Lymphs Abs: 2.2 10*3/uL (ref 0.7–4.0)
MCH: 25.1 pg — ABNORMAL LOW (ref 26.0–34.0)
MCHC: 31.8 g/dL (ref 30.0–36.0)
MCV: 79.1 fL — ABNORMAL LOW (ref 80.0–100.0)
Monocytes Absolute: 1.8 10*3/uL — ABNORMAL HIGH (ref 0.1–1.0)
Monocytes Relative: 16 %
Neutro Abs: 7.1 10*3/uL (ref 1.7–7.7)
Neutrophils Relative %: 63 %
Platelets: 524 10*3/uL — ABNORMAL HIGH (ref 150–400)
RBC: 3.78 MIL/uL — ABNORMAL LOW (ref 4.22–5.81)
RDW: 18.6 % — ABNORMAL HIGH (ref 11.5–15.5)
WBC: 11.4 10*3/uL — ABNORMAL HIGH (ref 4.0–10.5)
nRBC: 0 % (ref 0.0–0.2)

## 2022-07-27 LAB — HIV-1 RNA QUANT-NO REFLEX-BLD
HIV 1 RNA Quant: 1060 copies/mL
LOG10 HIV-1 RNA: 3.025 log10copy/mL

## 2022-07-27 MED ORDER — OXYCODONE HCL 5 MG PO TABS
5.0000 mg | ORAL_TABLET | ORAL | Status: DC | PRN
  Administered 2022-07-27 (×2): 5 mg via ORAL
  Filled 2022-07-27 (×2): qty 1

## 2022-07-27 MED ORDER — HYDROCODONE-ACETAMINOPHEN 5-325 MG PO TABS: 1.0000 | ORAL_TABLET | ORAL | Status: DC | PRN

## 2022-07-27 MED ORDER — MORPHINE SULFATE (PF) 4 MG/ML IV SOLN: 4.0000 mg | INTRAVENOUS | Status: DC | PRN

## 2022-07-27 MED ORDER — PREDNISONE 20 MG PO TABS: 20.0000 mg | ORAL_TABLET | Freq: Every day | ORAL | Status: DC

## 2022-07-27 MED ORDER — ONDANSETRON HCL 4 MG/2ML IJ SOLN: 4.0000 mg | Freq: Four times a day (QID) | INTRAMUSCULAR | Status: DC | PRN

## 2022-07-27 MED ORDER — ACETAMINOPHEN 500 MG PO TABS
1000.0000 mg | ORAL_TABLET | Freq: Four times a day (QID) | ORAL | Status: DC | PRN
  Administered 2022-07-27: 1000 mg via ORAL
  Filled 2022-07-27: qty 2

## 2022-07-27 MED ORDER — PREDNISONE 20 MG PO TABS
20.0000 mg | ORAL_TABLET | Freq: Every day | ORAL | Status: DC
  Administered 2022-07-28: 20 mg via ORAL
  Filled 2022-07-27: qty 1

## 2022-07-27 NOTE — ED Notes (Signed)
Writer changed patient's wound dressing on neck. 2 open wounds noted that had a pinkish/yellow color. No odor noted. Mild/moderate yellowish/pink drainage on old dressing. Wound cleansed prior to replacing dressing. No pain reported by patient at this time.

## 2022-07-27 NOTE — ED Provider Notes (Signed)
Emergency Medicine Observation Re-evaluation Note  Physical Exam   BP 122/68   Pulse 97   Temp (!) 100.6 F (38.1 C) (Oral)   Resp 20   Ht 6\' 5"  (1.956 m)   Wt 93 kg   SpO2 99%   BMI 24.31 kg/m   Patient appears in no acute distress. Temp 100.6; scheduled tylenol, scheduled abx.   ED Course / MDM   No reported events during my shift at the time of this note.   Pt is awaiting transfer to [facility] MD    Lucillie Garfinkel, MD 07/27/22 [phone]

## 2022-07-27 NOTE — ED Notes (Signed)
Update given to [facility] RN

## 2022-07-27 NOTE — ED Notes (Signed)
Dressing to neck changed at this time.

## 2022-07-27 NOTE — ED Notes (Signed)
Lab at bedside to collect labs.

## 2022-07-27 NOTE — Progress Notes (Signed)
Date of Admission:  07/25/2022   Total days of antibiotics          ID: Michael Chandler is a 37 y.o. male  Active Problems:   Psychosis   AIDS (acquired immune deficiency syndrome)   Major depressive disorder, recurrent severe without psychotic features   Syphilis   Chronic viral hepatitis B without delta-agent   Discitis of cervical region   Cellulitis and abscess of neck   AKI (acute kidney injury)   Chronic anemia   Amphetamine dependence Michael Chandler is a 37 y.o. 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 Patient has complicated medical history He is from [place] and had moved to [place].  2 years ago. He initially denied having HIV  while admitted in psych unit in August 2023 but later acknowledged that he was on [place] when he was in [place] but he had been out of care.  In August 2023 his viral load was 174 K and CD4 was 99.  He was restarted on Symtuza In October 2023 was admitted to [facility] in [place] and was extensively investigated for opportunistic infection including lumbar puncture.  At that time HIV RNA was 160,000 and CD4 was 48. He also had hepatitis B DNA of 661. RPR was 1:2  positive and tPA was positive. He was then started on Biktarvy dapsone and fluconazole and discharged on the same.   He was admitted to behavioral health unit in [place], [place] in Nov 2023 and after discharge he followed with infectious disease physician Dr. Juleen China at [facility] of  infectious disease, [place] on 03/13/22.  He was complaining of pain in the left side of his neck during that visit and an MRI was ordered.  Patient never followed through. He was later admitted to [facility] and during that hospitalization I saw him on 03/16/2022 and he was noted to have left cervical lymphadenitis.  CT neck showed multiple lymph nodes on the left side.  Blood culture was sent for AFB which was negative. 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 He had needle biopsy of the lymph node   On 03/21/2022 and it showed necrotizing granulomatous inflammation.  Acid-fast bacilli were seen. ID work up then revealed 03/13/22 VL 3180 Cd4 went upto to 181 on 03/18/22 ( from 48 on 02/14/22)) CMV DNA 617- low level- was not treated due to low copies EBV dna positive but < 35 copies Quantiferon TB gold 03/18/23  -negative Sputum AFB 11/23 -MAI 03/18/22- sputum Afb -MAI 11/28 -sputum Afb - MAI 11/28 biopsy  of neck- MAI 03/18/22 Blood culture  Afb- negative Bartonella-Neg Toxo-Neg Fungal antibodies negative    he was asked to follow-up with his IDP but did not keep the appointment On 04/04/2022 was readmitted to [facility] for fever and I started him on MAC therapy with rifabutin, azithromycin and ethambutol. Because he was not rifabutin the plan was to change Biktarvy Descovy( TAF+FTC) and Tivicay.( Dolutegravir) Patient has been seen by psychiatrist during all his stay He initially was taking MAC treatment regularly and lymph  nodes were starting to regress but but later in December 2023 when he was seen he was not taking his meds and the lymph nodes were starting to enlarge.  Also he had a draining lymph node and during his prior hospitalization ENT was consulted and they had talked to [place] and for possible lymph node excision.  The plan was to send him as outpatient but he never made an appointment.   He did not follow after that and got admitted to  [facility] on 06/26/2022  because of psychotic behavior noticed by his mom and he was positive for crystal meth.  He was complaining of squirrels running over his body.  Since then patient has been visiting ED frequently with the same complaint. Today he came to the ED because he had developed another draining wound and has pain.   He denies any fever He does not have any cough or shortness of breath He is now claiming to be adherent to medications because he has been given daily blister packs. Along with ethambutol, azithromycin, rifabutin, Tivicay, Descovy, atovaquone Naprosyn was also prescribed to treat IRIS.  Steroids was initially not chosen because of risk for other opportunistic infections.   Subjective: Pt c/o pain left side of the neck Some discharge from the wounds  Medications:   atovaquone  1,500 mg Oral Daily   azithromycin  500 mg Oral Daily   benztropine  1 mg Oral Daily   dolutegravir  50 mg Oral Daily   emtricitabine-tenofovir AF  1 tablet Oral Daily   ethambutol  1,200 mg Oral Daily   haloperidol  5 mg Oral BID   rifabutin  300 mg Oral Daily    Objective: Vital signs in last 24 hours: Patient Vitals for the past 24 hrs:  BP Temp Temp src Pulse Resp SpO2  07/27/22 0639 122/68 (!) 100.6 F (38.1 C) Oral 97 20 99 %  07/27/22 0303 121/73 98.1 F (36.7 C) Oral 86 20 97 %  07/26/22 2340 102/65 98 F (36.7 C) Oral 99 20 100 %  07/26/22 2014 136/84 -- -- 83 18 100 %  07/26/22 1652 124/75 -- -- 79 16 100 %  07/26/22 1523 -- 98.8 F (37.1 C) Oral 88 14 99 %  07/26/22 1114 119/66 -- -- 85 16 100 %       PHYSICAL EXAM:  General: pt sleeping , he wakes up on calling and responds  Neck: left cervical adenopathy.matted, necrosis with sinus   Lungs: Clear to auscultation bilaterally. No Wheezing or Rhonchi. No rales. Heart: Regular rate and rhythm, no murmur, rub or gallop. Abdomen: Soft, non-tender,not distended. Bowel sounds normal. No masses Extremities: atraumatic, no cyanosis. No  edema. No clubbing Skin: No rashes or lesions. Or bruising Lymph: Cervical, supraclavicular normal. Neurologic: Grossly non-focal  Lab Results    Latest Ref Rng & Units 07/25/2022    8:28 AM 07/03/2022   10:13 PM 06/29/2022    3:43 AM  CBC  WBC 4.0 - 10.5 K/uL 11.3  9.6  4.5   Hemoglobin 13.0 - 17.0 g/dL 11.1  10.4  9.9   Hematocrit 39.0 - 52.0 % 34.8  33.3  31.9   Platelets 150 - 400 K/uL 641  494  332        Latest Ref Rng & Units 07/25/2022    8:28 AM 07/03/2022   10:13 PM 06/29/2022    3:43 AM  CMP  Glucose 70 - 99 mg/dL 114  98  88  BUN 6 - 20 mg/dL 27  15  8    Creatinine 0.61 - 1.24 mg/dL 1.23  1.47  0.81   Sodium 135 - 145 mmol/L 136  136  135   Potassium 3.5 - 5.1 mmol/L 3.6  3.7  4.0   Chloride 98 - 111 mmol/L 104  103  106   CO2 22 - 32 mmol/L 23  29  24    Calcium 8.9 - 10.3 mg/dL 9.3  8.6  8.2   Total Protein 6.5 - 8.1 g/dL 9.5  9.2    Total Bilirubin 0.3 - 1.2 mg/dL 0.8  0.4    Alkaline Phos 38 - 126 U/L 120  126    AST 15 - 41 U/L 50  22    ALT 0 - 44 U/L 28  15        Microbiology: [facility] NG Studies/Results: CT Soft Tissue Neck W Contrast  Result Date: 07/25/2022 CLINICAL DATA:  Left-sided neck pain.  History of neck infection. EXAM: CT NECK WITH CONTRAST TECHNIQUE: Multidetector CT imaging of the neck was performed using the standard protocol following the bolus administration of intravenous contrast. RADIATION DOSE REDUCTION: This exam was performed according to the departmental dose-optimization program which includes automated exposure control, adjustment of the mA and/or kV according to patient size and/or use of iterative reconstruction technique. CONTRAST:  56mL OMNIPAQUE IOHEXOL 300 MG/ML  SOLN COMPARISON:  CT neck most recently 07/04/2022 FINDINGS: Image quality is significantly degraded by motion artifact as well as streak artifact from cervical spine fusion hardware. Pharynx and larynx: The nasal cavity and nasopharynx are unremarkable. The oral cavity and  oropharynx are unremarkable. The parapharyngeal spaces are clear. The hypopharynx and larynx are unremarkable. The vocal folds are grossly unremarkable. There is no retropharyngeal fluid collection.  The airway is patent. Salivary glands: The parotid glands are unremarkable. The left submandibular gland is not well assessed. The right submandibular gland is grossly unremarkable. Thyroid: Unremarkable. Lymph nodes: The lymph nodes are suboptimally assessed due to image quality. The previously seen left level II lymph node conglomerate currently measures up to approximately 1.7 cm in short axis (3-51), decreased in size from 2.1 cm at a similar level on the prior study. There is possible internal necrosis within the lymph node (3-50). Posterior triangle lymph nodes measuring up to 1.5 cm are similar in size (8-101, 95). No other individual lymph nodes confidently be identified due to motion and streak artifact. There is a prominent precarinal lymph node with possible internal necrosis measuring up to 1.4 cm in short axis, similar to the prior study (8-145). Vascular: The left IJ is again compressed, similar to the prior study. The major vasculature of the neck is otherwise grossly unremarkable. Limited intracranial: The imaged intracranial compartment is grossly unremarkable. Visualized orbits: The globes and orbits are grossly unremarkable. Mastoids and visualized paranasal sinuses: Clear. Skeleton: Postsurgical changes reflecting C4 through C7 fusion with C5 and C6 corpectomies, and prior decompression at C3 through T1 are again seen. There is no evidence of complication. There is no acute osseous abnormality or suspicious osseous lesion. Upper chest: The imaged lung apices are clear. Other: There is an irregular peripherally enhancing collection in the left lateral lower neck at the posterior margin of the sternocleidomastoid muscle measures approximately 2.9 cm AP x 1.8 cm TV x 3.9 cm cc on the current study,  decreased in size from the prior study but not resolved. More superiorly in the left paraspinal musculature, there is an additional collection  measuring 2.6 cm AP x 0.9 cm TV by 2.1 cm cc, also decreased in size. There remains asymmetric thickening of the left sternocleidomastoid muscle with interdigitating free fluid. No other definite defined abscess is identified. IMPRESSION: 1. Poor image quality due to significant motion artifact as well as streak artifact from cervical spine fusion hardware. Within this confine, the appearance of the neck overall is improved since 07/04/2022. 2. Decreased size of the abscess is along the posterior margin of the left sternocleidomastoid and in the left paraspinal musculature with residual asymmetric thickening of the sternocleidomastoid muscle and interdigitating free fluid. No definite new or enlarging abscess. 3. Decreased size of the necrotic left level II lymph node, and unchanged left posterior triangle and possibly necrotic precarinal lymph node measuring 1.4 cm. No definite new or progressive lymphadenopathy in the neck. Recommend continued imaging follow-up to resolution. Electronically Signed   By: Valetta Mole M.D.   On: 07/25/2022 10:17     Assessment/Plan: AIDS.  Patient is on dolutegravir, TAF+FTC Fluctuating adherence. June 26, 2022 viral load was? 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 Neg e antigen and e antibody.  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

## 2022-07-27 NOTE — ED Notes (Signed)
Reached out to [facility] for transfer update. Spoke to [place] at [facility] and she stated pt is still waiting for an available bed.

## 2022-07-27 NOTE — ED Notes (Signed)
Pt asleep in bed at this time, pt not hooked up to cardiac monitor or pulse ox. Previous RN note states he was refusing, pts vitals will be spot checked.

## 2022-07-27 NOTE — ED Notes (Signed)
Applesauce and juice given to patient per request. No distress noted at this time.

## 2022-07-27 NOTE — ED Notes (Signed)
Called [facility] for update of bed assignment. Still waiting/no bed available att.

## 2022-07-27 NOTE — ED Notes (Signed)
Lab called to collect labs  

## 2022-07-27 NOTE — ED Notes (Signed)
Pt requested grape juice and applesauce. Writer gave both to patient

## 2022-07-27 NOTE — ED Notes (Signed)
Called [facility] for bed status /patient still on wait list spoke with Ronny Bacon

## 2022-07-27 NOTE — ED Notes (Signed)
Pt declining to remain on continuous telemetry/pulse ox/BP. Agreeable to being spot checked for vitals

## 2022-07-28 ENCOUNTER — Other Ambulatory Visit: Payer: Self-pay

## 2022-07-28 DIAGNOSIS — L0211 Cutaneous abscess of neck: Secondary | ICD-10-CM | POA: Diagnosis not present

## 2022-07-28 DIAGNOSIS — A310 Pulmonary mycobacterial infection: Secondary | ICD-10-CM | POA: Diagnosis not present

## 2022-07-28 DIAGNOSIS — B20 Human immunodeficiency virus [HIV] disease: Secondary | ICD-10-CM | POA: Diagnosis not present

## 2022-07-28 DIAGNOSIS — L03221 Cellulitis of neck: Secondary | ICD-10-CM | POA: Diagnosis not present

## 2022-07-28 MED ORDER — PREDNISONE 10 MG PO TABS
ORAL_TABLET | ORAL | 0 refills | Status: DC
  Filled 2022-07-28: qty 75, 30d supply, fill #0

## 2022-07-28 MED ORDER — PREDNISONE 20 MG PO TABS
20.0000 mg | ORAL_TABLET | Freq: Once | ORAL | Status: AC
  Administered 2022-07-28: 20 mg via ORAL
  Filled 2022-07-28: qty 1

## 2022-07-28 MED ORDER — PREDNISONE 10 MG PO TABS
ORAL_TABLET | ORAL | 0 refills | Status: AC
  Filled 2022-07-28: qty 75, 30d supply, fill #0

## 2022-07-28 MED ORDER — PREDNISONE 20 MG PO TABS: 40.0000 mg | ORAL_TABLET | Freq: Every day | ORAL | Status: DC

## 2022-07-28 NOTE — ED Notes (Signed)
Called [facility] for update. Per Burna Mortimer, call back after discharge the AM to see if bed is available.

## 2022-07-28 NOTE — Progress Notes (Signed)
Date of Admission:  07/25/2022   Total days of antibiotics          ID: Michael Chandler is a 37 y.o. male  Active Problems:   Psychosis   AIDS (acquired immune deficiency syndrome)   Major depressive disorder, recurrent severe without psychotic features   Syphilis   Chronic viral hepatitis B without delta-agent   Discitis of cervical region   Cellulitis and abscess of neck   AKI (acute kidney injury)   Chronic anemia   Amphetamine dependence Michael Chandler is a 37 y.o. 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 Patient has complicated medical history He is from [place] and had moved to [place].  2 years ago. He initially denied having HIV  while admitted in psych unit in August 2023 but later acknowledged that he was on [place] when he was in [place] but he had been out of care.  In August 2023 his viral load was 174 K and CD4 was 99.  He was restarted on Symtuza In October 2023 was admitted to [facility] in [place] and was extensively investigated for opportunistic infection including lumbar puncture.  At that time HIV RNA was 160,000 and CD4 was 48. He also had hepatitis B DNA of 661. RPR was 1:2  positive and tPA was positive. He was then started on Biktarvy dapsone and fluconazole and discharged on the same.   He was admitted to behavioral health unit in [place], [place] in Nov 2023 and after discharge he followed with infectious disease physician Dr. Juleen China at [facility] of  infectious disease, [place] on 03/13/22.  He was complaining of pain in the left side of his neck during that visit and an MRI was ordered.  Patient never followed through. He was later admitted to [facility] and during that hospitalization I saw him on 03/16/2022 and he was noted to have left cervical lymphadenitis.  CT neck showed multiple lymph nodes on the left side.  Blood culture was sent for AFB which was negative. 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 He had needle biopsy of the lymph node   On 03/21/2022 and it showed necrotizing granulomatous inflammation.  Acid-fast bacilli were seen. ID work up then revealed 03/13/22 VL 3180 Cd4 went upto to 181 on 03/18/22 ( from 48 on 02/14/22)) CMV DNA 617- low level- was not treated due to low copies EBV dna positive but < 35 copies Quantiferon TB gold 03/18/23  -negative Sputum AFB 11/23 -MAI 03/18/22- sputum Afb -MAI 11/28 -sputum Afb - MAI 11/28 biopsy  of neck- MAI 03/18/22 Blood culture  Afb- negative Bartonella-Neg Toxo-Neg Fungal antibodies negative    he was asked to follow-up with his IDP but did not keep the appointment On 04/04/2022 was readmitted to [facility] for fever and I started him on MAC therapy with rifabutin, azithromycin and ethambutol. Because he was not rifabutin the plan was to change Biktarvy Descovy( TAF+FTC) and Tivicay.( Dolutegravir) Patient has been seen by psychiatrist during all his stay He initially was taking MAC treatment regularly and lymph  nodes were starting to regress but but later in December 2023 when he was seen he was not taking his meds and the lymph nodes were starting to enlarge.  Also he had a draining lymph node and during his prior hospitalization ENT was consulted and [facility] and for possible lymph node excision.  The plan was to send him as outpatient but he never made an appointment.   He did not follow after that and got admitted to  [facility] on 06/26/2022  because of psychotic behavior noticed by his mom and he was positive for crystal meth.  He was complaining of squirrels running over his body.  Since then patient has been visiting ED frequently with the same complaint. Today he came to the ED because he had developed another draining wound and has pain.   He denies any fever He does not have any cough or shortness of breath He is now claiming to be adherent to medications because he has been given daily blister packs. Along with ethambutol, azithromycin, rifabutin, Tivicay, Descovy, atovaquone Naprosyn was also prescribed to treat IRIS.  Steroids was initially not chosen because of risk for other opportunistic infections.   Subjective: Pt wants to go home He says he has a court case on Monday and cannot be hanging out here [facility] He was started on prednisone yesterday  Medications:   atovaquone  1,500 mg Oral Daily   azithromycin  500 mg Oral Daily   benztropine  1 mg Oral Daily   dolutegravir  50 mg Oral Daily   emtricitabine-tenofovir AF  1 tablet Oral Daily   ethambutol  1,200 mg Oral Daily   haloperidol  5 mg Oral BID   predniSONE  20 mg Oral Once   [START ON 07/29/2022] predniSONE  40 mg Oral Q breakfast   rifabutin  300 mg Oral Daily    Objective: Vital signs in last 24 hours: Patient Vitals for the past 24 hrs:  BP Temp Temp src Pulse Resp SpO2  07/28/22 0929 104/65 98.2 F (36.8 C) -- 79 17 98 %  07/28/22 0442 121/72 -- -- 84 18 99 %  07/28/22 0124 -- 98.9 F (37.2 C) Oral -- -- --  07/27/22 2331 120/70 -- -- 81 18 98 %  07/27/22 1946 126/76 98.8 F (37.1 C) Oral 100 18 98 %  07/27/22 1658 (!) 134/90 99.3 F (37.4 C) Oral 90 16 100 %  07/27/22 1652 -- 99.3 F (37.4 C) Oral -- -- --  07/27/22 1650 (!) 134/90 -- -- 90 -- 100 %       PHYSICAL EXAM:  General: awake ,no distress Neck: left cervical adenopathy.matted, necrosis with sinus   Lungs: Clear to auscultation bilaterally. No Wheezing or Rhonchi.  No rales. Heart: Regular rate and rhythm, no murmur, rub or gallop. Abdomen: Soft, non-tender,not distended. Bowel sounds normal. No masses Extremities: atraumatic, no cyanosis. No edema. No clubbing Skin: No rashes or lesions. Or bruising Lymph: Cervical, supraclavicular normal. Neurologic: Grossly non-focal  Lab Results    Latest Ref Rng & Units 07/27/2022   11:42 AM 07/26/2022    5:15 AM 07/25/2022    8:28 AM  CBC  WBC 4.0 - 10.5 K/uL 11.4  6.0  11.3   Hemoglobin 13.0 - 17.0 g/dL 9.5  16.110.3  09.611.1   Hematocrit 39.0 - 52.0 % 29.9  33.4  34.8   Platelets 150 - 400 K/uL 524  530  641  Latest Ref Rng & Units 07/27/2022   11:42 AM 07/25/2022    8:28 AM 07/03/2022   10:13 PM  CMP  Glucose 70 - 99 mg/dL 87  568  98   BUN 6 - 20 mg/dL 12  27  15    Creatinine 0.61 - 1.24 mg/dL 6.16  8.37  2.90   Sodium 135 - 145 mmol/L 134  136  136   Potassium 3.5 - 5.1 mmol/L 3.6  3.6  3.7   Chloride 98 - 111 mmol/L 103  104  103   CO2 22 - 32 mmol/L 24  23  29    Calcium 8.9 - 10.3 mg/dL 8.2  9.3  8.6   Total Protein 6.5 - 8.1 g/dL 7.2  9.5  9.2   Total Bilirubin 0.3 - 1.2 mg/dL 0.5  0.8  0.4   Alkaline Phos 38 - 126 U/L 104  120  126   AST 15 - 41 U/L 19  50  22   ALT 0 - 44 U/L 15  28  15        Microbiology: [facility] NG Studies/Results: No results found.   Assessment/Plan: AIDS.  Continue  on dolutegravir, TAF+FTC Fluctuating adherence. June 26, 2022 viral load was? 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 Possible IRIS ( immune reconstitution syndrome,) but never has been adherent enough to keep cd4 high, but now Cd4 is 248 Continue ethambutol 1200mg  Qd , azithromycin 500mg  QD  and rifabutin 300mg  QD  ( ongoing substance use, behavioral  issues interferes with .adherence.)  Has blister pack from [facility] delivered to his house to help with adherence  Because of ongoing necrosis and sinus formation and increasing cd4 started prednisone 40mg   mg to help with IRIS 40mg  X 5 days, followed by 30mg  X5 days and then he will stay on 20 mg qd.  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 Neg e antigen and e antibody.  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 Discussed with pharmacist to help procure prednisone  Spoke with Dr.Goodman, and nursing staff as patient asking to go home and at risk for AMA ( like before) and as no beds in [facility] better to discharge him as he is stable and follow up as OP with me I told him he should call medicaid transportation 5 days before an appt so as to come to the appointment/ FU appt 10/08/22 at 11.15 Am with me

## 2022-07-28 NOTE — ED Provider Notes (Signed)
Procedures     ----------------------------------------- 4:06 PM on 07/28/2022 ----------------------------------------- Infectious disease has evaluated the patient, planned to give him medications that he can take with him.  They educated him on how to take the medications and made a follow-up plan for him.  Pharmacy has provided the medications for the patient and he is ready for discharge at this point per ID recommendations, since there is still no bed availability at [facility] for transfer after several days of wait.Sharman Cheek, MD 07/28/22 [phone]

## 2022-07-28 NOTE — Discharge Instructions (Addendum)
call medicaid transportation 5 days before your appointment so as to come to the appointment/ FU appt 10/08/22 at 11:15 Am with Dr. Rivka Safer.

## 2022-07-28 NOTE — ED Notes (Signed)
Pt given snack at this time  

## 2022-07-28 NOTE — ED Provider Notes (Signed)
-----------------------------------------   8:50 AM on 07/28/2022 -----------------------------------------   Blood pressure 121/72, pulse 84, temperature 98.9 F (37.2 C), temperature source Oral, resp. rate 18, height 1.956 m (6\' 5" ), weight 93 kg, SpO2 99 %.  The patient is calm and cooperative at this time.  There have been no acute events since the last update.  Still awaiting transfer to [facility].   Loleta Rose, MD 07/28/22 [phone]

## 2022-07-30 LAB — CULTURE, BLOOD (ROUTINE X 2)
Culture: NO GROWTH
Culture: NO GROWTH
Special Requests: ADEQUATE

## 2022-07-31 ENCOUNTER — Telehealth: Payer: Self-pay | Admitting: Pharmacist

## 2022-07-31 ENCOUNTER — Other Ambulatory Visit (HOSPITAL_COMMUNITY): Payer: Self-pay

## 2022-07-31 ENCOUNTER — Other Ambulatory Visit: Payer: Self-pay

## 2022-07-31 NOTE — Telephone Encounter (Signed)
[facility] and clinic pharmacy staff have been trying to contact patient for refills of his medications with no luck.  Patient last filled on 06/30/22. We have tried on 07/19/22, 07/21/22, 07/24/22, and 07/31/22.   Sani Madariaga L. Rylah Fukuda, PharmD [facility] Clinical Pharmacist Practitioner

## 2022-07-31 NOTE — Telephone Encounter (Signed)
We will enroll him in the text messaging system!

## 2022-08-01 NOTE — Telephone Encounter (Signed)
I am unable to text him personally, but I did tell the pharmacy to set him up for the text messaging system for his refills and they have reached out that way and nothing back from him so far.   I did just try to call him again and it said "call cannot be completed as dialed" multiple times, so I'm not even sure his phone is active anymore.Marland KitchenMarland Kitchen

## 2022-08-02 ENCOUNTER — Other Ambulatory Visit: Payer: Self-pay

## 2022-08-03 ENCOUNTER — Telehealth: Payer: Self-pay | Admitting: Infectious Diseases

## 2022-08-03 NOTE — Telephone Encounter (Signed)
Tried to call patient to find out how he was doing after discharge and also let him know that [facility] was calling for delivery of his blister pack meds ( HIV and MAC) - cannot reach patient- phone not in service.

## 2022-08-08 ENCOUNTER — Inpatient Hospital Stay: Payer: No Typology Code available for payment source | Admitting: Infectious Diseases

## 2022-08-11 LAB — MISC LABCORP TEST (SEND OUT): Labcorp test code: 183764

## 2022-08-25 ENCOUNTER — Telehealth: Payer: Self-pay

## 2022-08-25 NOTE — Telephone Encounter (Signed)
[facility] Patient Advocate Encounter  [facility] and I have been unsuccsessful in reaching patient to be able to refill medication.    We have tried multiple times without a response.  Alexsus Papadopoulos, CPhT Specialty Pharmacy Patient Advocate [facility] Phone: [phone] Fax:  [phone]  

## 2022-09-06 ENCOUNTER — Ambulatory Visit: Payer: No Typology Code available for payment source | Admitting: Family Medicine

## 2022-09-21 ENCOUNTER — Telehealth: Payer: Self-pay | Admitting: Pharmacist

## 2022-09-21 NOTE — Telephone Encounter (Signed)
[facility] and clinic pharmacy staff have tried to reach this patient multiple times over phone calls and texts without response. Contacts attempted on 3/8, 5/3, and 5/30 without success (after multiple contacts in March). Wanted to pass along since patient does not currently have any future appointments scheduled.  Best,  Margarite Gouge, PharmD, CPP, BCIDP, AAHIVP Clinical Pharmacist Practitioner Infectious Diseases Clinical Pharmacist [facility]

## 2022-09-22 NOTE — Telephone Encounter (Signed)
Easy enough - thank you for seeing that. Michael Chandler

## 2022-12-30 ENCOUNTER — Other Ambulatory Visit: Payer: Self-pay

## 2023-01-02 ENCOUNTER — Other Ambulatory Visit (HOSPITAL_COMMUNITY): Payer: Self-pay

## 2023-01-17 ENCOUNTER — Other Ambulatory Visit: Payer: Self-pay

## 2023-01-18 ENCOUNTER — Other Ambulatory Visit: Payer: Self-pay

## 2023-01-31 ENCOUNTER — Emergency Department: Payer: Medicare Other

## 2023-01-31 ENCOUNTER — Emergency Department
Admission: EM | Admit: 2023-01-31 | Discharge: 2023-01-31 | Disposition: A | Discharge status: Home / Self Care | Payer: Medicare Other | Attending: Emergency Medicine | Admitting: Emergency Medicine

## 2023-01-31 ENCOUNTER — Other Ambulatory Visit: Payer: Self-pay

## 2023-01-31 DIAGNOSIS — R112 Nausea with vomiting, unspecified: Secondary | ICD-10-CM

## 2023-01-31 DIAGNOSIS — Z20822 Contact with and (suspected) exposure to covid-19: Secondary | ICD-10-CM | POA: Diagnosis not present

## 2023-01-31 DIAGNOSIS — A044 Other intestinal Escherichia coli infections: Secondary | ICD-10-CM | POA: Insufficient documentation

## 2023-01-31 LAB — CBC
HCT: 39.6 % (ref 39.0–52.0)
Hemoglobin: 12.5 g/dL — ABNORMAL LOW (ref 13.0–17.0)
MCH: 26 pg (ref 26.0–34.0)
MCHC: 31.6 g/dL (ref 30.0–36.0)
MCV: 82.3 fL (ref 80.0–100.0)
Platelets: 338 10*3/uL (ref 150–400)
RBC: 4.81 MIL/uL (ref 4.22–5.81)
RDW: 16.4 % — ABNORMAL HIGH (ref 11.5–15.5)
WBC: 6.5 10*3/uL (ref 4.0–10.5)
nRBC: 0 % (ref 0.0–0.2)

## 2023-01-31 LAB — URINALYSIS, ROUTINE W REFLEX MICROSCOPIC
Bilirubin Urine: NEGATIVE
Glucose, UA: NEGATIVE mg/dL
Hgb urine dipstick: NEGATIVE
Ketones, ur: NEGATIVE mg/dL
Leukocytes,Ua: NEGATIVE
Nitrite: NEGATIVE
Protein, ur: NEGATIVE mg/dL
Specific Gravity, Urine: >1.046 — ABNORMAL HIGH (ref 1.005–1.030)
pH: 6 (ref 5.0–8.0)

## 2023-01-31 LAB — C DIFFICILE QUICK SCREEN W PCR REFLEX
C Diff antigen: NEGATIVE
C Diff interpretation: NOT DETECTED
C Diff toxin: NEGATIVE

## 2023-01-31 LAB — LIPASE, BLOOD: Lipase: 34 U/L (ref 11–51)

## 2023-01-31 LAB — COMPREHENSIVE METABOLIC PANEL
ALT: 25 U/L (ref 0–44)
AST: 38 U/L (ref 15–41)
Albumin: 3.8 g/dL (ref 3.5–5.0)
Alkaline Phosphatase: 99 U/L (ref 38–126)
Anion gap: 8 (ref 5–15)
BUN: 18 mg/dL (ref 6–20)
CO2: 21 mmol/L — ABNORMAL LOW (ref 22–32)
Calcium: 8.7 mg/dL — ABNORMAL LOW (ref 8.9–10.3)
Chloride: 104 mmol/L (ref 98–111)
Creatinine, Ser: 0.82 mg/dL (ref 0.61–1.24)
GFR, Estimated: >60 mL/min (ref >60)
Glucose, Bld: 108 mg/dL — ABNORMAL HIGH (ref 70–99)
Potassium: 3.5 mmol/L (ref 3.5–5.1)
Sodium: 133 mmol/L — ABNORMAL LOW (ref 135–145)
Total Bilirubin: 0.7 mg/dL (ref 0.3–1.2)
Total Protein: 9 g/dL — ABNORMAL HIGH (ref 6.5–8.1)

## 2023-01-31 LAB — GASTROINTESTINAL PANEL BY PCR, STOOL (REPLACES STOOL CULTURE)
Adenovirus F40/41: NOT DETECTED
Astrovirus: NOT DETECTED
Campylobacter species: NOT DETECTED
Cryptosporidium: NOT DETECTED
Cyclospora cayetanensis: NOT DETECTED
Entamoeba histolytica: NOT DETECTED
Enteroaggregative E coli (EAEC): DETECTED — AB
Enteropathogenic E coli (EPEC): DETECTED — AB
Enterotoxigenic E coli (ETEC): NOT DETECTED
Giardia lamblia: NOT DETECTED
Norovirus GI/GII: NOT DETECTED
Plesimonas shigelloides: NOT DETECTED
Rotavirus A: NOT DETECTED
Salmonella species: NOT DETECTED
Sapovirus (I, II, IV, and V): NOT DETECTED
Shiga like toxin producing E coli (STEC): NOT DETECTED
Shigella/Enteroinvasive E coli (EIEC): NOT DETECTED
Vibrio cholerae: NOT DETECTED
Vibrio species: NOT DETECTED
Yersinia enterocolitica: NOT DETECTED

## 2023-01-31 LAB — SARS CORONAVIRUS 2 BY RT PCR: SARS Coronavirus 2 by RT PCR: NEGATIVE

## 2023-01-31 MED ORDER — ONDANSETRON HCL 4 MG/2ML IJ SOLN
4.0000 mg | Freq: Once | INTRAMUSCULAR | Status: AC
  Administered 2023-01-31: 4 mg via INTRAVENOUS
  Filled 2023-01-31: qty 2

## 2023-01-31 MED ORDER — IOHEXOL 300 MG/ML  SOLN
100.0000 mL | Freq: Once | INTRAMUSCULAR | Status: AC | PRN
  Administered 2023-01-31: 100 mL via INTRAVENOUS

## 2023-01-31 NOTE — ED Triage Notes (Signed)
Pt presents to ED with c/o N/V/D since yesterday. NAD noted.

## 2023-01-31 NOTE — ED Provider Notes (Signed)
[facility] Provider Note    Event Date/Time   First MD Initiated Contact with Patient 01/31/23 1225     (approximate)   History   Nausea and Emesis   HPI  Michael Chandler is a 37 y.o. male with a past medical history of AIDS, schizophrenia, methamphetamine use who presents today for evaluation of nausea, vomiting, diarrhea that began yesterday.  He reports that he has been off his antiretrovirals for the past several months.  He is unsure when his last viral load or CD4 count was.  He denies fevers or chills.  He has not had significant abdominal pain.  He denies blood in his stool.  He is unsure of any sick contacts.  He denies recent IV drug use, though reports that he used IV meth several months ago.  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) 04/05/2022   Multifocal pneumonia 04/04/2022   Fever 03/17/2022   Sepsis (HCC) 03/16/2022   Immunocompromised state (HCC) 03/15/2022   Lymphadenopathy 03/15/2022   Pansinusitis 03/15/2022   SOB (shortness of breath) 03/15/2022   Need for pneumocystis prophylaxis 03/13/2022   Candida esophagitis (HCC) 03/13/2022   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   Syphilis 03/13/2022   Chronic viral hepatitis B without delta-agent (HCC) 03/13/2022   Discitis of cervical region 03/13/2022   Major depressive disorder, 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   Suicide ideation 03/01/2022   AIDS (acquired immune deficiency  syndrome) (HCC) 12/18/2021   Psychosis (HCC) 12/17/2021   Schizophrenia, 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/16/2021   Methamphetamine abuse (HCC) 12/16/2021          Physical Exam   Triage Vital Signs: ED Triage Vitals [01/31/23 1124]  Encounter Vitals Group     BP 109/79     Systolic BP Percentile      Diastolic BP Percentile      Pulse Rate 99     Resp 19     Temp 98.7 F (37.1 C)     Temp Source Oral     SpO2 100 %     Weight 200 lb (90.7 kg)     Height 6\' 5"  (1.956 m)     Head Circumference      Peak Flow      Pain Score 0     Pain Loc      Pain Education      Exclude from Growth Chart     Most recent vital signs: Vitals:   01/31/23 1124  BP: 109/79  Pulse: 99  Resp: 19  Temp: 98.7 F (37.1 C)  SpO2: 100%    Physical Exam Vitals and nursing note reviewed.  Constitutional:      General: Awake and alert. No acute distress.    Appearance: Normal appearance. The patient is normal weight.  HENT:     Head: Normocephalic and atraumatic.     Mouth: Mucous membranes are moist.  Eyes:     General: PERRL. Normal EOMs        Right eye: No discharge.  Left eye: No discharge.     Conjunctiva/sclera: Conjunctivae normal.  Cardiovascular:     Rate and Rhythm: Normal rate and regular rhythm.     Pulses: Normal pulses.  Pulmonary:     Effort: Pulmonary effort is normal. No respiratory distress.     Breath sounds: Normal breath sounds.  Abdominal:     Abdomen is soft. There is no abdominal tenderness. No rebound or guarding. No distention. Musculoskeletal:        General: No swelling. Normal range of motion.     Cervical back: Normal range of motion and neck supple.  Skin:    General: Skin is warm and dry.     Capillary Refill: Capillary refill takes less than 2 seconds.     Findings: No rash.  Neurological:     Mental Status: The patient is awake and alert.      ED Results / Procedures / Treatments   Labs (all labs ordered are listed, but only abnormal  results are displayed) Labs Reviewed  GASTROINTESTINAL PANEL BY PCR, STOOL (REPLACES STOOL CULTURE) - Abnormal; Notable for the following components:      Result Value   Enteroaggregative E coli (EAEC) DETECTED (*)    Enteropathogenic E coli (EPEC) DETECTED (*)    All other components within normal limits  COMPREHENSIVE METABOLIC PANEL - Abnormal; Notable for the following components:   Sodium 133 (*)    CO2 21 (*)    Glucose, Bld 108 (*)    Calcium 8.7 (*)    Total Protein 9.0 (*)    All other components within normal limits  CBC - Abnormal; Notable for the following components:   Hemoglobin 12.5 (*)    RDW 16.4 (*)    All other components within normal limits  URINALYSIS, ROUTINE W REFLEX MICROSCOPIC - Abnormal; Notable for the following components:   Color, Urine YELLOW (*)    APPearance CLEAR (*)    Specific Gravity, Urine >1.046 (*)    All other components within normal limits  SARS CORONAVIRUS 2 BY RT PCR  C DIFFICILE QUICK SCREEN W PCR REFLEX    LIPASE, BLOOD     EKG     RADIOLOGY I independently reviewed and interpreted imaging and agree with radiologists findings.     PROCEDURES:  Critical Care performed:   Procedures   MEDICATIONS ORDERED IN ED: Medications  ondansetron (ZOFRAN) injection 4 mg (4 mg Intravenous Given 01/31/23 1317)  iohexol (OMNIPAQUE) 300 MG/ML solution 100 mL (100 mLs Intravenous Contrast Given 01/31/23 1349)     IMPRESSION / MDM / ASSESSMENT AND PLAN / ED COURSE  I reviewed the triage vital signs and the nursing notes.   Differential diagnosis includes, but is not limited to, gastroenteritis, opportunistic infection, intra-abdominal infection, dehydration, electrolyte disarray.  Patient is awake and alert, hemodynamically stable and afebrile.  I reviewed the patient's chart.  Patient has had multiple emergency department visits.  He has seen Dr. Rivka Safer with infectious disease in the past, and has been diagnosed with  AIDS.  Further workup is indicated today given that he is immunocompromised.  Labs obtained are overall reassuring.  I did obtain CT abdomen and pelvis given his immunocompromise state, and this is overall reassuring.  He was able to provide a stool sample which demonstrates EAEC and EPEC.  I discussed this with Dr. Rivka Safer given that he is immunocompromised.  Most recent CD4 count in our system was 03/02/2022 and at that time it was 85.  Last viral  load was 11,202. CD4 in 09/02/22 was 13, absolute CD4 was 283.  Stool samples negative for C. difficile, though does reveal EPAC and ECAC.  Given that he is severely immunocompromised with AIDS, this case was discussed with Dr. Rivka Safer.  She does not feel that the patient needs to be started on antibiotics at this time.  She has made him an appointment on 10/15 at 10 AM and patient agrees to go.  This information was written down for him on his paperwork and I emphasized the importance of this appointment.  I discussed with Dr. Joylene Draft the possibility of restarting his antiretrovirals today, though she prefers to wait until she is able to see him in her clinic.  I also discussed this with the patient.  In the meantime, we discussed return precautions to the emergency department.  Patient was able to eat and drink quite a bit in the emergency department and did not have any further vomiting.  He had 1 episode of diarrhea during his 7.5-hour stay.  Patient understands his results today and the importance of very close outpatient follow-up with Dr. Rivka Safer during his appointment time.  He was discharged in stable condition.   Patient's presentation is most consistent with acute complicated illness / injury requiring diagnostic workup.   Clinical Course as of 01/31/23 1853  Wed Jan 31, 2023  1750 Message sent to Dr. Rivka Safer [JP]  [phone] Discussed case with Dr. Rivka Safer who does not want to start antibiotic therapy.  She has made an  appointment for him on 10/15 at 10 AM and patient agrees to go.  She recommends not restarting his antiretrovirals at this time, she will restart when she sees him. [JP]    Clinical Course User Index [JP] Waylynn Benefiel, Herb Grays, PA-C     FINAL CLINICAL IMPRESSION(S) / ED DIAGNOSES   Final diagnoses:  Nausea vomiting and diarrhea  E. coli gastroenteritis     Rx / DC Orders   ED Discharge Orders     None        Note:  This document was prepared using Dragon voice recognition software and may include unintentional dictation errors.   Jackelyn Hoehn, PA-C 01/31/23 1853    Janith Lima, MD 02/03/23 [phone]

## 2023-01-31 NOTE — Discharge Instructions (Signed)
You have E. coli in your stool which is causing your diarrhea.  This was discussed with your infectious disease doctor who does not want to start antibiotics at this time.  It is very important that you see her in her clinic on 02/06/23 at 10 am in [address]. Call [phone] if you have to change his appointment time.  She will also restart your antiretrovirals at that time.  Please return to the emergency department for any new, worsening, or change in symptoms or other concerns.  It was a pleasure caring for you today.

## 2023-02-06 ENCOUNTER — Ambulatory Visit: Payer: Medicare Other | Admitting: Infectious Diseases

## 2023-02-09 NOTE — Hospital Discharge Follow-Up (Signed)
Pt did not come for the ID appt on Tuesday 10/15  Phone in the chart  is not active No contact .

## 2023-02-28 ENCOUNTER — Telehealth: Payer: Self-pay

## 2023-02-28 NOTE — Telephone Encounter (Signed)
Called patient to schedule follow up appointment, number is not in service.   Sandie Ano, RN

## 2023-04-27 ENCOUNTER — Emergency Department
Admission: EM | Admit: 2023-04-27 | Discharge: 2023-04-27 | Discharge status: Against Medical Advice | Payer: Medicare Other | Attending: Emergency Medicine | Admitting: Emergency Medicine

## 2023-04-27 ENCOUNTER — Other Ambulatory Visit: Payer: Self-pay

## 2023-04-27 ENCOUNTER — Encounter: Payer: Self-pay | Admitting: Emergency Medicine

## 2023-04-27 DIAGNOSIS — Z5321 Procedure and treatment not carried out due to patient leaving prior to being seen by health care provider: Secondary | ICD-10-CM | POA: Insufficient documentation

## 2023-04-27 DIAGNOSIS — K0889 Other specified disorders of teeth and supporting structures: Secondary | ICD-10-CM | POA: Diagnosis present

## 2023-04-27 NOTE — ED Triage Notes (Addendum)
 Patient ambulatory to triage with steady gait, without difficulty or distress noted; pt reports oral lesion x mo

## 2023-04-28 ENCOUNTER — Emergency Department
Admission: EM | Admit: 2023-04-28 | Discharge: 2023-04-28 | Disposition: A | Discharge status: Home / Self Care | Payer: Medicare Other | Attending: Emergency Medicine | Admitting: Emergency Medicine

## 2023-04-28 ENCOUNTER — Encounter: Payer: Self-pay | Admitting: Emergency Medicine

## 2023-04-28 ENCOUNTER — Other Ambulatory Visit: Payer: Self-pay

## 2023-04-28 DIAGNOSIS — Z21 Asymptomatic human immunodeficiency virus [HIV] infection status: Secondary | ICD-10-CM | POA: Insufficient documentation

## 2023-04-28 DIAGNOSIS — K0889 Other specified disorders of teeth and supporting structures: Secondary | ICD-10-CM | POA: Insufficient documentation

## 2023-04-28 MED ORDER — AMOXICILLIN 500 MG PO TABS: 500.0000 mg | ORAL_TABLET | Freq: Three times a day (TID) | ORAL | 0 refills | Status: DC

## 2023-04-28 NOTE — Discharge Instructions (Addendum)
Please take the antibiotics as prescribed. You can take 650 mg of Tylenol and 600 mg of ibuprofen every 6 hours as needed for pain.  OPTIONS FOR DENTAL FOLLOW UP CARE  [place] Department of Health and Human Services - Local Safety Net Dental Clinics [email or web address]   [facility] [phone])  Sharl Ma [phone])  [facility] [phone])  [facility] [phone])  [facility] [phone]) This clinic caters to the indigent population and is on a lottery system. Location: [employer] [facility], [employer], [address], [place] Clinic Hours: Wednesdays from 6pm - 9pm, patients seen by a lottery system. For dates, call or go to [email or web address] Services: Cleanings, fillings and simple extractions. Payment Options: DENTAL WORK IS FREE OF CHARGE. Bring proof of income or support. Best way to get seen: Arrive at 5:15 pm - this is a lottery, NOT first come/first serve, so arriving earlier will not increase your chances of being seen.     [facility] [phone] Select option 1 for emergencies   Location: [facility], [place], [address] [place] Clinic Hours: No walk-ins accepted - call the day before to schedule an appointment. Check in times are 9:30 am and 1:30 pm. Services: Simple extractions, temporary fillings, pulpectomy/pulp debridement, uncomplicated abscess drainage. Payment Options: PAYMENT IS DUE AT THE TIME OF SERVICE.  Fee is usually $100-200, additional surgical procedures (e.g. abscess drainage) may be extra. Cash, checks, Visa/MasterCard accepted.  Can file Medicaid if patient is covered for dental - patient should call case worker to check. No discount for [facility] patients. Best way to get seen: MUST call the day before and get onto  the schedule. Can usually be seen the next 1-2 days. No walk-ins accepted.     [facility] [phone]   Location: [facility], [address], [place] Clinic Hours: M, W, Th, F 8am or 1:30pm, Tues 9a or 1:30 - first come/first served. Services: Simple extractions, temporary fillings, uncomplicated abscess drainage.  You do not need to be an [facility] resident. Payment Options: PAYMENT IS DUE AT THE TIME OF SERVICE. Dental insurance, otherwise sliding scale - bring proof of income or support. Depending on income and treatment needed, cost is usually $50-200. Best way to get seen: Arrive early as it is first come/first served.     [facility] [phone]   Location: [address] Clinic Hours: Mon-Thu 8a-5p Services: Most basic dental services including extractions and fillings. Payment Options: PAYMENT IS DUE AT THE TIME OF SERVICE. Sliding scale, up to 50% off - bring proof if income or support. Medicaid with dental option accepted. Best way to get seen: Call to schedule an appointment, can usually be seen within 2 weeks OR they will try to see walk-ins - show up at 8a or 2p (you may have to wait).     [facility] [phone] [place] RESIDENTS ONLY   Location: [facility], [address], [place], [place] [address] Clinic Hours: By appointment only. Monday - Thursday 8am-5pm, Friday 8am-12pm Services: Cleanings, fillings, extractions. Payment Options: PAYMENT IS DUE AT THE TIME OF SERVICE. Cash, Visa or MasterCard. Sliding scale - $30 minimum per service. Best way to get seen: Come in to office, complete packet and make an appointment - need proof of income or support monies for each household member and proof of [place] residence. Usually takes about a month to get in.     [facility] [phone]    Location: [address] [facility] Clinic Hours: Walk-in Urgent Care Dental Services are offered Monday-Friday mornings only. The numbers of emergencies accepted daily is limited to the number of providers available. Maximum 15 - Mondays, Wednesdays & Thursdays Maximum 10 - Tuesdays & Fridays Services: You do not need to be a [facility] resident to be seen for a dental emergency. Emergencies are defined as pain, swelling, abnormal bleeding, or dental trauma. Walkins will receive x-rays if needed. NOTE: Dental cleaning is not an emergency. Payment Options: PAYMENT IS DUE AT THE TIME OF SERVICE. Minimum co-pay is $40.00 for uninsured patients. Minimum co-pay is $3.00 for Medicaid with dental coverage. Dental Insurance is accepted and must be presented at time of visit. Medicare does not cover dental. Forms of payment: Cash, credit card, checks. Best way to get seen: If not previously registered with the clinic, walk-in dental registration begins at 7:15 am and is on a first come/first serve basis. If previously registered with the clinic, call to make an appointment.     [facility] [phone] [place] RESIDENTS ONLY   Location: [address] [place], [place] Clinic Hours: Mon-Thu 10a-2p Services: Extractions only! Payment Options: FREE (donations accepted) - bring proof of income or support Best way to get seen: Call and schedule an appointment OR come at 8am on the 1st Monday of every month (except for holidays) when it is first come/first served.     [facility] [phone]   Location: [address] [place], [place] Clinic Hours: Friday mornings Services, Payment Options, Best way to get seen: Call for info

## 2023-04-28 NOTE — ED Triage Notes (Addendum)
 Patient BIB [facility] initially for [facility] complaint.  In triage, patient c/o "abscess" under tongue x 2 days. Patient denies any BH complaints at this time. No swelling noted to mouth/jaw.

## 2023-04-28 NOTE — ED Provider Notes (Signed)
 [facility] Provider Note    Event Date/Time   First MD Initiated Contact with Patient 04/28/23 [phone]     (approximate)   History   Dental Pain   HPI  Michael Chandler is a 38 y.o. male with PMH of HIV, schizophrenia amphetamine dependence presents for evaluation of dental pain.  Patient states he feels like there is an abscess in the bottom of his mouth.  This pain started a couple days ago.      Physical Exam   Triage Vital Signs: ED Triage Vitals [04/28/23 0544]  Encounter Vitals Group     BP (!) 150/105     Systolic BP Percentile      Diastolic BP Percentile      Pulse Rate (!) 109     Resp 18     Temp 98.7 F (37.1 C)     Temp Source Oral     SpO2 100 %     Weight 200 lb (90.7 kg)     Height      Head Circumference      Peak Flow      Pain Score 8     Pain Loc      Pain Education      Exclude from Growth Chart     Most recent vital signs: Vitals:   04/28/23 0544  BP: (!) 150/105  Pulse: (!) 109  Resp: 18  Temp: 98.7 F (37.1 C)  SpO2: 100%   General: Awake, no distress.  CV:  Good peripheral perfusion.  Resp:  Normal effort.  He is full sentences, tolerating secretions. Abd:  No distention.  Other:  Oral mucous membranes are moist, patient does have a few missing teeth and there is molar and bottom left jaw that has a cavity, patient's area of concern at the bottom of his mouth does appear to be red but there is no defined abscess or swelling, no dysphagia, no trismus, no drooling, tongue is not swollen or raised up, no facial swelling or neck swelling.   ED Results / Procedures / Treatments   Labs (all labs ordered are listed, but only abnormal results are displayed) Labs Reviewed - No data to display   PROCEDURES:  Critical Care performed: No  Procedures   MEDICATIONS ORDERED IN ED: Medications - No data to display   IMPRESSION / MDM / ASSESSMENT AND PLAN / ED COURSE  I reviewed the triage vital signs and the  nursing notes.                             38 year old male presents for evaluation of dental pain.  Patient was hypertensive and tachycardic in triage otherwise vital signs are stable.  Patient NAD on exam.  Differential diagnosis includes, but is not limited to, pulpitis, gingivitis, abscess, Ludwig's angina.  Patient's presentation is most consistent with acute, uncomplicated illness.  Patient's physical exam is reassuring, does not present with signs concerning for Ludwig's angina.  He does have poor dentition with multiple cavities so we will cover with oral antibiotics.  He was advised on over-the-counter pain medications like Tylenol  and ibuprofen .  He was given information for dental follow-up.  He voiced understanding, all questions were answered and he was stable at discharge.    FINAL CLINICAL IMPRESSION(S) / ED DIAGNOSES   Final diagnoses:  Pain, dental     Rx / DC Orders   ED Discharge Orders  Ordered    amoxicillin  (AMOXIL ) 500 MG tablet  3 times daily        04/28/23 0741             Note:  This document was prepared using Dragon voice recognition software and may include unintentional dictation errors.   Cleaster Tinnie LABOR, PA-C 04/28/23 9251    Bradler, Evan K, MD 04/28/23 1425

## 2023-04-30 ENCOUNTER — Emergency Department: Payer: Medicare Other

## 2023-04-30 ENCOUNTER — Other Ambulatory Visit: Payer: Self-pay

## 2023-04-30 ENCOUNTER — Emergency Department
Admission: EM | Admit: 2023-04-30 | Discharge: 2023-04-30 | Disposition: A | Discharge status: Home / Self Care | Payer: Medicare Other | Attending: Emergency Medicine | Admitting: Emergency Medicine

## 2023-04-30 ENCOUNTER — Emergency Department
Admission: EM | Admit: 2023-04-30 | Discharge: 2023-04-30 | Discharge status: Against Medical Advice | Payer: Medicare Other | Source: Home / Self Care

## 2023-04-30 ENCOUNTER — Encounter: Payer: Self-pay | Admitting: Emergency Medicine

## 2023-04-30 DIAGNOSIS — K0889 Other specified disorders of teeth and supporting structures: Secondary | ICD-10-CM | POA: Diagnosis present

## 2023-04-30 DIAGNOSIS — B20 Human immunodeficiency virus [HIV] disease: Secondary | ICD-10-CM | POA: Insufficient documentation

## 2023-04-30 DIAGNOSIS — Z5321 Procedure and treatment not carried out due to patient leaving prior to being seen by health care provider: Secondary | ICD-10-CM | POA: Diagnosis not present

## 2023-04-30 DIAGNOSIS — K047 Periapical abscess without sinus: Secondary | ICD-10-CM | POA: Insufficient documentation

## 2023-04-30 DIAGNOSIS — M542 Cervicalgia: Secondary | ICD-10-CM | POA: Diagnosis not present

## 2023-04-30 LAB — BASIC METABOLIC PANEL
Anion gap: 10 (ref 5–15)
BUN: 11 mg/dL (ref 6–20)
CO2: 24 mmol/L (ref 22–32)
Calcium: 8.2 mg/dL — ABNORMAL LOW (ref 8.9–10.3)
Chloride: 106 mmol/L (ref 98–111)
Creatinine, Ser: 0.98 mg/dL (ref 0.61–1.24)
GFR, Estimated: >60 mL/min (ref >60)
Glucose, Bld: 81 mg/dL (ref 70–99)
Potassium: 3.4 mmol/L — ABNORMAL LOW (ref 3.5–5.1)
Sodium: 140 mmol/L (ref 135–145)

## 2023-04-30 LAB — CBC WITH DIFFERENTIAL/PLATELET
Abs Immature Granulocytes: 0.01 10*3/uL (ref 0.00–0.07)
Basophils Absolute: 0 10*3/uL (ref 0.0–0.1)
Basophils Relative: 1 %
Eosinophils Absolute: 0.2 10*3/uL (ref 0.0–0.5)
Eosinophils Relative: 4 %
HCT: 35.3 % — ABNORMAL LOW (ref 39.0–52.0)
Hemoglobin: 11.3 g/dL — ABNORMAL LOW (ref 13.0–17.0)
Immature Granulocytes: 0 %
Lymphocytes Relative: 17 %
Lymphs Abs: 0.7 10*3/uL (ref 0.7–4.0)
MCH: 26.8 pg (ref 26.0–34.0)
MCHC: 32 g/dL (ref 30.0–36.0)
MCV: 83.6 fL (ref 80.0–100.0)
Monocytes Absolute: 0.8 10*3/uL (ref 0.1–1.0)
Monocytes Relative: 20 %
Neutro Abs: 2.4 10*3/uL (ref 1.7–7.7)
Neutrophils Relative %: 58 %
Platelets: 235 10*3/uL (ref 150–400)
RBC: 4.22 MIL/uL (ref 4.22–5.81)
RDW: 13.8 % (ref 11.5–15.5)
WBC: 4 10*3/uL (ref 4.0–10.5)
nRBC: 0 % (ref 0.0–0.2)

## 2023-04-30 LAB — LACTIC ACID, PLASMA: Lactic Acid, Venous: 0.7 mmol/L (ref 0.5–1.9)

## 2023-04-30 MED ORDER — TIVICAY 50 MG PO TABS
50.0000 mg | ORAL_TABLET | Freq: Every day | ORAL | 3 refills | Status: DC
  Filled 2023-04-30: qty 30, 30d supply, fill #0

## 2023-04-30 MED ORDER — IOHEXOL 300 MG/ML  SOLN
80.0000 mL | Freq: Once | INTRAMUSCULAR | Status: AC | PRN
  Administered 2023-04-30: 80 mL via INTRAVENOUS

## 2023-04-30 MED ORDER — DEXAMETHASONE SODIUM PHOSPHATE 10 MG/ML IJ SOLN
10.0000 mg | Freq: Once | INTRAMUSCULAR | Status: AC
  Administered 2023-04-30: 10 mg via INTRAVENOUS
  Filled 2023-04-30: qty 1

## 2023-04-30 MED ORDER — AMPICILLIN-SULBACTAM SODIUM 3 (2-1) G IJ SOLR
3.0000 g | Freq: Once | INTRAMUSCULAR | Status: AC
  Administered 2023-04-30: 3 g via INTRAVENOUS
  Filled 2023-04-30: qty 8

## 2023-04-30 MED ORDER — SODIUM CHLORIDE 0.9 % IV BOLUS (SEPSIS)
1000.0000 mL | Freq: Once | INTRAVENOUS | Status: AC
  Administered 2023-04-30: 1000 mL via INTRAVENOUS

## 2023-04-30 MED ORDER — AMOXICILLIN-POT CLAVULANATE 875-125 MG PO TABS
1.0000 | ORAL_TABLET | Freq: Two times a day (BID) | ORAL | 0 refills | Status: DC
  Filled 2023-04-30: qty 14, 7d supply, fill #0

## 2023-04-30 MED ORDER — DESCOVY 200-25 MG PO TABS
1.0000 | ORAL_TABLET | Freq: Every day | ORAL | 3 refills | Status: DC
  Filled 2023-04-30: qty 30, 30d supply, fill #0

## 2023-04-30 NOTE — ED Triage Notes (Signed)
 Pt reports he was seen here yesterday for dental abscess, and lost his prescription he was given. Pt has d/c paperwork and prescriptions were sent electronically to pharmacy.

## 2023-04-30 NOTE — ED Triage Notes (Signed)
 Patient states the abscess I was here for the other night is spitting out and causing pneumonia.  Patient stated that he accidentally threw his script away that he received from the other night.  Patient denies trouble swallowing, cough, difficulty breathing.

## 2023-04-30 NOTE — ED Provider Notes (Addendum)
-----------------------------------------   8:01 AM on 04/30/2023 ----------------------------------------- Patient CT scan has resulted showing inflammation with a very small fluid collection.  Patient has completed IV antibiotics.  Will place patient on  Augmenting 2 times daily for 10 days.  Patient will follow-up with a PCP.  Discussed return precautions.  Patient agreeable to plan.   Dorothyann Drivers, MD 04/30/23 9198    Dorothyann Drivers, MD 04/30/23 [phone]

## 2023-04-30 NOTE — ED Provider Notes (Signed)
 [facility] Provider Note    Event Date/Time   First MD Initiated Contact with Patient 04/30/23 [phone]     (approximate)   History   Abscess   HPI  Michael Chandler is a 38 y.o. male   Past medical history of HIV noncompliant with antiretroviral therapy, polysubstance use, schizophrenia presents to the emergency department with mouth/neck pain.  Several days of pain.  Was seen in the emergency department recently for the same and prescribed antibiotics for which he has not picked up.  He states that the pain is underneath his left tongue, left submandibular area.  He states that there is a mass in the area as well.    External Medical Documents Reviewed: Emergency department notes from April 2024 with lesions of the left side of his neck, with a plan for transfer to ENT at [facility] for abscess      Physical Exam   Triage Vital Signs: ED Triage Vitals  Encounter Vitals Group     BP 04/30/23 0352 (!) 140/92     Systolic BP Percentile --      Diastolic BP Percentile --      Pulse Rate 04/30/23 0352 (!) 111     Resp 04/30/23 0352 18     Temp 04/30/23 0352 98.3 F (36.8 C)     Temp Source 04/30/23 0352 Oral     SpO2 04/30/23 0352 100 %     Weight 04/30/23 0353 199 lb 15.3 oz (90.7 kg)     Height --      Head Circumference --      Peak Flow --      Pain Score 04/30/23 0352 8     Pain Loc --      Pain Education --      Exclude from Growth Chart --     Most recent vital signs: Vitals:   04/30/23 0352 04/30/23 0619  BP: (!) 140/92 (!) 142/99  Pulse: (!) 111 89  Resp: 18 18  Temp: 98.3 F (36.8 C)   SpO2: 100% 100%    General: Awake, no distress.  CV:  Good peripheral perfusion.  Resp:  Normal effort.  Abd:  No distention.  Other:  There are no obvious masses or distention or open lesions to his neck and specifically to the submandibular area of the left side that the patient is complaining about pain.  His neck is supple with full range of motion.   There are no intraoral lesions or abscesses noted, and there is no posterior oropharyngeal masses and uvula is midline.  Phonation is normal.  He is nontoxic-appearing and he is initially tachycardic at triage.  He is afebrile.   ED Results / Procedures / Treatments   Labs (all labs ordered are listed, but only abnormal results are displayed) Labs Reviewed  BASIC METABOLIC PANEL - Abnormal; Notable for the following components:      Result Value   Potassium 3.4 (*)    Calcium 8.2 (*)    All other components within normal limits  CBC WITH DIFFERENTIAL/PLATELET - Abnormal; Notable for the following components:   Hemoglobin 11.3 (*)    HCT 35.3 (*)    All other components within normal limits  CULTURE, BLOOD (ROUTINE X 2)  CULTURE, BLOOD (ROUTINE X 2)  LACTIC ACID, PLASMA  LACTIC ACID, PLASMA     I ordered and reviewed the above labs they are notable for white blood cell count is normal  PROCEDURES:  Critical Care performed: No  Procedures   MEDICATIONS ORDERED IN ED: Medications  Ampicillin -Sulbactam (UNASYN ) 3 g in sodium chloride  0.9 % 100 mL IVPB (3 g Intravenous New Bag/Given 04/30/23 0610)  dexamethasone  (DECADRON ) injection 10 mg (10 mg Intravenous Given 04/30/23 0610)  sodium chloride  0.9 % bolus 1,000 mL (1,000 mLs Intravenous New Bag/Given 04/30/23 0609)   IMPRESSION / MDM / ASSESSMENT AND PLAN / ED COURSE  I reviewed the triage vital signs and the nursing notes.                                Patient's presentation is most consistent with acute presentation with potential threat to life or bodily function.  Differential diagnosis includes, but is not limited to, dental infection, submandibular abscess, lymphadenopathy, considered but less likely Ludwig angina or airway compromise  MDM:    This is a patient with complaints of dental pain/submandibular swelling and pain underneath his tongue but I do not see any physical exam findings correlating to these  complaints.  There is certainly no intraoral mass that would require draining from clinical exam and is phonation is normal and airway intact.  However given his immunocompromise state with HIV not on antiretroviral therapy and prior abscesses that have developed in that area of his neck I will obtain a CT of the soft tissue of his neck.  Will give IV antibiotics, steroids.  If imaging is negative plan will be for discharge and continue on Augmentin  in case early dental infection and referral to dental resources.  I wrote a prescription for his antiretroviral therapy to be picked up at this pharmacy at the hospital as well as the antibiotics.  I reemphasized the need to follow-up with his infectious disease doctor.  This patient was signed out in stable condition at shift change 7 AM pending the results of his CT scan.       FINAL CLINICAL IMPRESSION(S) / ED DIAGNOSES   Final diagnoses:  Pain, dental     Rx / DC Orders   ED Discharge Orders          Ordered    dolutegravir  (TIVICAY ) 50 MG tablet  Daily        04/30/23 0641    emtricitabine -tenofovir  AF (DESCOVY ) 200-25 MG tablet  Daily        04/30/23 0641    amoxicillin -clavulanate (AUGMENTIN ) 875-125 MG tablet  2 times daily        04/30/23 0641             Note:  This document was prepared using Dragon voice recognition software and may include unintentional dictation errors.    Cyrena Mylar, MD 04/30/23 [phone]

## 2023-04-30 NOTE — ED Notes (Addendum)
 Pt arrived from [place] at [facility], for possible a tonsil abscess, pt was seen a few days for dental pain and given Rx for amoxicillin. NAD noted, A&O x4.   159/98 98% RA 113 HR

## 2023-04-30 NOTE — ED Notes (Signed)
 Pt refusing vital signs, requesting to leave in the middle of triage

## 2023-04-30 NOTE — Discharge Instructions (Addendum)
 Take antibiotics and HIV medications as prescribed.  Call your infectious disease doctor for follow-up appointment soon as possible.  Please follow-up with [facility] at the number and address provided for any mental health issues.  To get further assessment of your dental health, call the dentist (find a local dentist from the attached references.)  Thank you for choosing us  for your health care today!  Please see your primary doctor this week for a follow up appointment.   If you have any new, worsening, or unexpected symptoms call your doctor right away or come back to the emergency department for reevaluation.  It was my pleasure to care for you today.   Ginnie EDISON Cyrena, MD

## 2023-05-01 ENCOUNTER — Other Ambulatory Visit: Payer: Self-pay

## 2023-05-01 ENCOUNTER — Other Ambulatory Visit (HOSPITAL_COMMUNITY): Payer: Self-pay

## 2023-05-02 ENCOUNTER — Emergency Department (EMERGENCY_DEPARTMENT_HOSPITAL)
Admission: EM | Admit: 2023-05-02 | Discharge: 2023-05-04 | Disposition: A | Discharge status: Other Acute Inpatient Hospital | Payer: Medicare Other | Source: Home / Self Care | Attending: Emergency Medicine | Admitting: Emergency Medicine

## 2023-05-02 ENCOUNTER — Emergency Department
Admission: EM | Admit: 2023-05-02 | Discharge: 2023-05-02 | Discharge status: Against Medical Advice | Payer: Medicare Other

## 2023-05-02 ENCOUNTER — Other Ambulatory Visit: Payer: Self-pay

## 2023-05-02 ENCOUNTER — Encounter: Payer: Self-pay | Admitting: Medical Oncology

## 2023-05-02 ENCOUNTER — Emergency Department
Admission: EM | Admit: 2023-05-02 | Discharge: 2023-05-02 | Disposition: A | Discharge status: Other Acute Inpatient Hospital | Payer: Medicare Other | Attending: Emergency Medicine | Admitting: Emergency Medicine

## 2023-05-02 DIAGNOSIS — R451 Restlessness and agitation: Secondary | ICD-10-CM | POA: Diagnosis present

## 2023-05-02 DIAGNOSIS — F1510 Other stimulant abuse, uncomplicated: Secondary | ICD-10-CM | POA: Insufficient documentation

## 2023-05-02 DIAGNOSIS — T50996A Underdosing of other drugs, medicaments and biological substances, initial encounter: Secondary | ICD-10-CM | POA: Insufficient documentation

## 2023-05-02 DIAGNOSIS — F1990 Other psychoactive substance use, unspecified, uncomplicated: Secondary | ICD-10-CM | POA: Insufficient documentation

## 2023-05-02 DIAGNOSIS — Z781 Physical restraint status: Secondary | ICD-10-CM | POA: Diagnosis not present

## 2023-05-02 DIAGNOSIS — F209 Schizophrenia, unspecified: Secondary | ICD-10-CM | POA: Insufficient documentation

## 2023-05-02 DIAGNOSIS — Z046 Encounter for general psychiatric examination, requested by authority: Secondary | ICD-10-CM

## 2023-05-02 DIAGNOSIS — F4310 Post-traumatic stress disorder, unspecified: Secondary | ICD-10-CM | POA: Insufficient documentation

## 2023-05-02 DIAGNOSIS — T360X6A Underdosing of penicillins, initial encounter: Secondary | ICD-10-CM | POA: Insufficient documentation

## 2023-05-02 DIAGNOSIS — M549 Dorsalgia, unspecified: Secondary | ICD-10-CM | POA: Insufficient documentation

## 2023-05-02 DIAGNOSIS — M542 Cervicalgia: Secondary | ICD-10-CM | POA: Diagnosis not present

## 2023-05-02 DIAGNOSIS — B20 Human immunodeficiency virus [HIV] disease: Secondary | ICD-10-CM | POA: Insufficient documentation

## 2023-05-02 DIAGNOSIS — K0889 Other specified disorders of teeth and supporting structures: Secondary | ICD-10-CM | POA: Diagnosis present

## 2023-05-02 DIAGNOSIS — Z79899 Other long term (current) drug therapy: Secondary | ICD-10-CM | POA: Diagnosis not present

## 2023-05-02 DIAGNOSIS — Z91128 Patient's intentional underdosing of medication regimen for other reason: Secondary | ICD-10-CM | POA: Diagnosis not present

## 2023-05-02 DIAGNOSIS — F329 Major depressive disorder, single episode, unspecified: Secondary | ICD-10-CM | POA: Diagnosis present

## 2023-05-02 DIAGNOSIS — F319 Bipolar disorder, unspecified: Secondary | ICD-10-CM | POA: Insufficient documentation

## 2023-05-02 DIAGNOSIS — F200 Paranoid schizophrenia: Secondary | ICD-10-CM | POA: Diagnosis not present

## 2023-05-02 DIAGNOSIS — F32A Depression, unspecified: Secondary | ICD-10-CM | POA: Insufficient documentation

## 2023-05-02 LAB — CBC
HCT: 29.8 % — ABNORMAL LOW (ref 39.0–52.0)
Hemoglobin: 9.8 g/dL — ABNORMAL LOW (ref 13.0–17.0)
MCH: 27.8 pg (ref 26.0–34.0)
MCHC: 32.9 g/dL (ref 30.0–36.0)
MCV: 84.7 fL (ref 80.0–100.0)
Platelets: 223 10*3/uL (ref 150–400)
RBC: 3.52 MIL/uL — ABNORMAL LOW (ref 4.22–5.81)
RDW: 14.2 % (ref 11.5–15.5)
WBC: 4.1 10*3/uL (ref 4.0–10.5)
nRBC: 0 % (ref 0.0–0.2)

## 2023-05-02 MED ORDER — DIPHENHYDRAMINE HCL 50 MG/ML IJ SOLN
INTRAMUSCULAR | Status: AC
  Administered 2023-05-02: 50 mg via INTRAMUSCULAR
  Filled 2023-05-02: qty 1

## 2023-05-02 MED ORDER — AMOXICILLIN-POT CLAVULANATE 875-125 MG PO TABS
1.0000 | ORAL_TABLET | Freq: Two times a day (BID) | ORAL | 0 refills | Status: DC
  Filled 2023-05-02: qty 20, 10d supply, fill #0

## 2023-05-02 MED ORDER — DIPHENHYDRAMINE HCL 50 MG/ML IJ SOLN: 50.0000 mg | Freq: Once | INTRAMUSCULAR | Status: AC

## 2023-05-02 MED ORDER — HALOPERIDOL LACTATE 5 MG/ML IJ SOLN: 5.0000 mg | Freq: Once | INTRAMUSCULAR | Status: AC

## 2023-05-02 MED ORDER — AMOXICILLIN-POT CLAVULANATE 875-125 MG PO TABS
1.0000 | ORAL_TABLET | Freq: Two times a day (BID) | ORAL | Status: DC
  Filled 2023-05-02: qty 1

## 2023-05-02 MED ORDER — LORAZEPAM 2 MG/ML IJ SOLN: 2.0000 mg | Freq: Once | INTRAMUSCULAR | Status: AC

## 2023-05-02 MED ORDER — AMOXICILLIN-POT CLAVULANATE 875-125 MG PO TABS
1.0000 | ORAL_TABLET | Freq: Once | ORAL | Status: DC
Start: 2023-05-02 — End: 2023-05-02

## 2023-05-02 MED ORDER — LORAZEPAM 2 MG/ML IJ SOLN
INTRAMUSCULAR | Status: AC
  Administered 2023-05-02: 2 mg via INTRAMUSCULAR
  Filled 2023-05-02: qty 1

## 2023-05-02 MED ORDER — HALOPERIDOL LACTATE 5 MG/ML IJ SOLN
INTRAMUSCULAR | Status: AC
  Administered 2023-05-02: 5 mg via INTRAMUSCULAR
  Filled 2023-05-02: qty 1

## 2023-05-02 MED ORDER — AMOXICILLIN-POT CLAVULANATE 875-125 MG PO TABS
1.0000 | ORAL_TABLET | Freq: Once | ORAL | Status: AC
  Administered 2023-05-02: 1 via ORAL
  Filled 2023-05-02: qty 1

## 2023-05-02 NOTE — BH Assessment (Signed)
 Patient currently unable to be assessed at this time, patient arrived agitated, aggressive and uncooperative, medications were administered. Will assess when patient is more cooperative and calm.

## 2023-05-02 NOTE — ED Notes (Signed)
 Pt is refusing temp check in triage. Pt wont sit still for vitals either.

## 2023-05-02 NOTE — ED Notes (Signed)
 See triage notes. Patient c/o pain to the frenulum under the tongue times two days.

## 2023-05-02 NOTE — ED Triage Notes (Addendum)
 Pt to ed from home via [employer] under IVC for pt states he wants to kill himself and his mother. Pt has diagnosed schizophrenia and is non compliant with meds. Pt is very loud and disruptive and cussing. Pt has been seen almost every day for dental pain in the last week.

## 2023-05-02 NOTE — ED Provider Notes (Addendum)
 [facility] Provider Note    Event Date/Time   First MD Initiated Contact with Patient 05/02/23 2109     (approximate)   History   Psychiatric Evaluation  EM caveat: Limited due to agitation  HPI  Michael Chandler is a 38 y.o. male history of schizophrenia also history of HIV polysubstance abuse, MAI, sepsis and chronic anemia  Patient brought in for concerns of suicidal ideation.  Reports he wants to kill himself and others.  Patient request to have handcuffs removed very pressured speech, will not listen to me when I tried to speak to him rather he keeps pressuring about removing his handcuffs and then tells me that they are probably going to give a medicine that can make him lay back and fall asleep     Physical Exam   Triage Vital Signs: ED Triage Vitals  Encounter Vitals Group     BP --      Systolic BP Percentile --      Diastolic BP Percentile --      Pulse Rate 05/02/23 2032 100     Resp 05/02/23 2032 20     Temp --      Temp src --      SpO2 05/02/23 2032 95 %     Weight --      Height --      Head Circumference --      Peak Flow --      Pain Score 05/02/23 2029 0     Pain Loc --      Pain Education --      Exclude from Growth Chart --     Most recent vital signs: Vitals:   05/02/23 2032  Pulse: 100  Resp: 20  SpO2: 95%   General: Awake, sitting up pressured speech agitated yelling cussing at security officers CV:  Good peripheral perfusion.  Resp:  Normal effort.  Abd:  No distention.  Other:    ----------------------------------------- 9:20 PM on 05/02/2023 ----------------------------------------- Face to Face for Manual hold performed due to degree of agitation. Therapeutic antipsychotic and benzodiazepine administered IM.   ----------------------------------------- 11:32 PM on 05/02/2023 ----------------------------------------- Resting comfortably now out of restraints.  Even unlabored respirations.  No distress  or ongoing agitation   ED Results / Procedures / Treatments   Labs (all labs ordered are listed, but only abnormal results are displayed) Labs Reviewed  COMPREHENSIVE METABOLIC PANEL  ETHANOL  SALICYLATE LEVEL  ACETAMINOPHEN  LEVEL  CBC  URINE DRUG SCREEN, QUALITATIVE ([facility] ONLY)     EKG     RADIOLOGY     PROCEDURES:  Critical Care performed: Yes, see critical care procedure note(s)  CRITICAL CARE Performed by: Oneil Budge   Total critical care time: 25 minutes  Critical care time was exclusive of separately billable procedures and treating other patients.  Critical care was necessary to treat or prevent imminent or life-threatening deterioration.  Critical care was time spent personally by me on the following activities: development of treatment plan with patient and/or surrogate as well as nursing, discussions with consultants, evaluation of patient's response to treatment, examination of patient, obtaining history from patient or surrogate, ordering and performing treatments and interventions, ordering and review of laboratory studies, ordering and review of radiographic studies, pulse oximetry and re-evaluation of patient's condition.   Procedures   MEDICATIONS ORDERED IN ED: Medications  amoxicillin -clavulanate (AUGMENTIN ) 875-125 MG per tablet 1 tablet (has no administration in time range)  haloperidol  lactate (HALDOL ) injection 5 mg (  5 mg Intramuscular Given 05/02/23 2118)  LORazepam  (ATIVAN ) injection 2 mg (2 mg Intramuscular Given 05/02/23 2118)  diphenhydrAMINE  (BENADRYL ) injection 50 mg (50 mg Intramuscular Given 05/02/23 2117)     IMPRESSION / MDM / ASSESSMENT AND PLAN / ED COURSE  I reviewed the triage vital signs and the nursing notes.                              Differential diagnosis includes, but is not limited to, possible exacerbation of underlying acute psychiatric illness, history of polysubstance abuse schizophrenia prior IVCs.  He also has  a somewhat complicated medical history including HIV AIDS.  Currently presents without obvious complaint of acute underlying medical cause but he is noted to currently be on treatment for dental pain with Augmentin  which we will continue.  I do not see any overt swelling or obvious abnormality on exam such as large mass edema rash or findings suggest obvious acute serious bacterial infection.  However ongoing medical screening labs etc. are pending at the time of signout.  Dr. Cyrena aware and recently has prior treatment of patient.  Patient's presentation is most consistent with acute complicated illness / injury requiring diagnostic workup.        ----------------------------------------- 11:29 PM on 05/02/2023 ----------------------------------------- Patient resting comfortably without distress at this time.  He is now out of restraints.  Chemical sedation has been helpful and he is no longer agitated he is calm and restful.  Awaiting medical screening labs at this time.  Dr. Cyrena (who advises has recently seen and evaluated patient as well) over taking care to follow-up on labs reassessment psych consult and continue IVC    FINAL CLINICAL IMPRESSION(S) / ED DIAGNOSES   Final diagnoses:  Involuntary commitment     Rx / DC Orders   ED Discharge Orders     None        Note:  This document was prepared using Dragon voice recognition software and may include unintentional dictation errors.   Dicky Anes, MD 05/02/23 7665    Dicky Anes, MD 05/04/23 1050

## 2023-05-02 NOTE — ED Notes (Signed)
 Pt arrived to waiting room, immediately began yelling at first RN and security, pt continuously yelling get this bitch away from me, I dont want to be here anymore, just get me the fuck out of here, Pt began to swing his arms at this RN and security, pt advised to leave facility if he no longer wishes to be seen. Pt was escorted out of [facility].

## 2023-05-02 NOTE — ED Notes (Addendum)
 Pt dressed out:  Black sneakers Olive green pants Red shorts Green/black hoodie Green tshirt [employer] Cell phone - [employer] broken screen 2 lighters  Grey socks

## 2023-05-02 NOTE — ED Notes (Signed)
 Patient requested orange and grape juice. This RN brought patient both to room. Patient remains in [employer]'s handcuff due to continued behavior. This RN was holding cups for patient to drink out of and patient states nah I aint drinking out of that bring me the real container. This RN explained the rules in this area, that the juices must go into the styrofoam cups. Patient stated nah fuck that you said it was spit, get that shit out of here. This RN explained no one spit into the cup & the juices were poured into the cup and that this RN was willing to open a new one in front of him into a new cup. Patient stated nah fuck that, shut the door.This RN exited room and threw juices away at this time.

## 2023-05-02 NOTE — ED Notes (Addendum)
 This RN attmpted to draw blood. Pt became agitated and needle came out. Pt had to be placed back in handcuffs. Pt had to be taken to the ground by PD.

## 2023-05-02 NOTE — ED Notes (Addendum)
 Patient to room in forensic restraints. Pt cussing at officers and staff stating get these fucking handcuffs off me. This RN attempted to verbally deescalate patient, patient continues to cuss at staff and demand to have restraints released. Patient has been pushing back against officers and threatening continually. IM medications ordered for staff and patient safety. Patient demanding to be let out of handcuffs before administration of medications, but staff counseled patient that due to behaviors and threats we cannot safely let him out of these at this time. Patient continues to cuss and attempt to get out of handcuffs. [employer] officers at bedside had to briefly hold patient for safe administration of IM medications due to patient kicking legs and demanding that this RN put that in my ankle right now. Patient immediately released by [employer] after administration of medications, but still remains in forensic handcuffs due to continued yelling and threatening. [employer] remains at bedside with patient at this time.

## 2023-05-02 NOTE — ED Provider Notes (Signed)
 [facility] Provider Note    Event Date/Time   First MD Initiated Contact with Patient 05/02/23 [phone]     (approximate)   History   mouth pain   HPI  Michael Chandler is a 38 y.o. male with history of HIV, polysubstance abuse, schizophrenia and as listed in EMR presents to the emergency department for evaluation of pain under his tongue.  He was evaluated here 2 days ago for the same but did not pick up his antibiotics.  He is also complaining of chronic neck and back pain.      Physical Exam   Triage Vital Signs: ED Triage Vitals [05/02/23 0910]  Encounter Vitals Group     BP 103/79     Systolic BP Percentile      Diastolic BP Percentile      Pulse Rate 81     Resp 18     Temp 97.9 F (36.6 C)     Temp Source Oral     SpO2 100 %     Weight 198 lb 6.6 oz (90 kg)     Height 6' 4 (1.93 m)     Head Circumference      Peak Flow      Pain Score 10     Pain Loc      Pain Education      Exclude from Growth Chart     Most recent vital signs: Vitals:   05/02/23 0910  BP: 103/79  Pulse: 81  Resp: 18  Temp: 97.9 F (36.6 C)  SpO2: 100%    General: Awake, no distress.  CV:  Good peripheral perfusion.  Resp:  Normal effort.  Abd:  No distention.  Other:  Erythema with very small area of edema noted to left side of frenulum. No palpable cervical lymph nodes.  Sublingual surface is soft.   Demonstrates FROM of neck and is moving from lying/sitting/standing without restriction or complaint of pain.   ED Results / Procedures / Treatments   Labs (all labs ordered are listed, but only abnormal results are displayed) Labs Reviewed - No data to display   EKG  Not indicated.   RADIOLOGY  Image and radiology report reviewed and interpreted by me. Radiology report consistent with the same.  Not indicated.  PROCEDURES:  Critical Care performed: No  Procedures   MEDICATIONS ORDERED IN ED:  Medications  amoxicillin -clavulanate  (AUGMENTIN ) 875-125 MG per tablet 1 tablet (has no administration in time range)  amoxicillin -clavulanate (AUGMENTIN ) 875-125 MG per tablet 1 tablet (1 tablet Oral Given 05/02/23 0953)     IMPRESSION / MDM / ASSESSMENT AND PLAN / ED COURSE   I have reviewed the triage note.  Differential diagnosis includes, but is not limited to, frenulum tear/infection, sublingual infection/Ludwig's angina. Acute on chronic neck/back pain.  Patient's presentation is most consistent with acute illness / injury with system symptoms.  38 year old male presents to the ER for continued complaint of pain and swelling under his tongue.  He was here 2 days ago for the same.  ER chart reviewed which shows essentially normal lab studies including blood cultures which have not had any growth in 2 days.  CT performed showed possibility of a low-density fluid collection in the submandibular space on the left.  He was given IV antibiotics and a prescription for Augmentin .  He has not picked up the Augmentin . He is also not currently taking his HIV medications.  Patient is very agitated today and  feels that he needs to be admitted for a full workup to include his HIV and to have back surgery.  He does not wish to discuss with me specific complaints related to these issues and just wants to be admitted.  On exam, the frenulum is slightly erythematous with very slight swelling to the left side.  Sublingual surface is soft.  Airway is patent.  Vital signs are normal and without any indication of systemic illness.  Regarding his back and neck pain he is moving without any distress or restriction. I see no need for additional labs or imaging today. Patient is very unhappy with this decision. I gave him an Augmentin  while here and sent Rx to the community pharmacy. He tells me he will pick it up if it's free. He was encouraged to call and schedule an appointment with Dr. Fayette with infectious disease if he would like to be  evaluated for his HIV status and restart medications.       FINAL CLINICAL IMPRESSION(S) / ED DIAGNOSES   Final diagnoses:  Pain, dental     Rx / DC Orders   ED Discharge Orders          Ordered    amoxicillin -clavulanate (AUGMENTIN ) 875-125 MG tablet  2 times daily        05/02/23 1012             Note:  This document was prepared using Dragon voice recognition software and may include unintentional dictation errors.   Herlinda Kirk NOVAK, FNP 05/02/23 1042    Willo Dunnings, MD 05/02/23 [phone]

## 2023-05-02 NOTE — Discharge Instructions (Signed)
 Take the antibiotic as prescribed and until finished.  Follow up with Dr. Rivka Safer

## 2023-05-02 NOTE — ED Triage Notes (Signed)
 Pt to ED via [facility]- reports pain to frenulum under tongue x 2 days.

## 2023-05-02 NOTE — ED Notes (Signed)
 This RN asked patient if staff could obtain blood work, patient stated nah. Due to patient behaviors will hold off on trying to obtain blood work at this time for staff safety. Patient appears to be falling asleep- [employer] released patient from handcuffs. Patient provided with blanket at this time.

## 2023-05-03 DIAGNOSIS — F200 Paranoid schizophrenia: Secondary | ICD-10-CM

## 2023-05-03 LAB — ACETAMINOPHEN LEVEL: Acetaminophen (Tylenol), Serum: <10 ug/mL — ABNORMAL LOW (ref 10–30)

## 2023-05-03 LAB — COMPREHENSIVE METABOLIC PANEL
ALT: 21 U/L (ref 0–44)
AST: 31 U/L (ref 15–41)
Albumin: 3.2 g/dL — ABNORMAL LOW (ref 3.5–5.0)
Alkaline Phosphatase: 67 U/L (ref 38–126)
Anion gap: 10 (ref 5–15)
BUN: 14 mg/dL (ref 6–20)
CO2: 20 mmol/L — ABNORMAL LOW (ref 22–32)
Calcium: 7.8 mg/dL — ABNORMAL LOW (ref 8.9–10.3)
Chloride: 108 mmol/L (ref 98–111)
Creatinine, Ser: 1.04 mg/dL (ref 0.61–1.24)
GFR, Estimated: >60 mL/min (ref >60)
Glucose, Bld: 89 mg/dL (ref 70–99)
Potassium: 3.3 mmol/L — ABNORMAL LOW (ref 3.5–5.1)
Sodium: 138 mmol/L (ref 135–145)
Total Bilirubin: 0.5 mg/dL (ref 0.0–1.2)
Total Protein: 6.7 g/dL (ref 6.5–8.1)

## 2023-05-03 LAB — SALICYLATE LEVEL: Salicylate Lvl: <7 mg/dL — ABNORMAL LOW (ref 7.0–30.0)

## 2023-05-03 LAB — ETHANOL: Alcohol, Ethyl (B): <10 mg/dL (ref <10)

## 2023-05-03 MED ORDER — OLANZAPINE 5 MG PO TABS
5.0000 mg | ORAL_TABLET | Freq: Every day | ORAL | Status: DC
  Administered 2023-05-03: 5 mg via ORAL
  Filled 2023-05-03: qty 1

## 2023-05-03 NOTE — BH Assessment (Signed)
 Comprehensive Clinical Assessment (CCA) Screening, Triage and Referral Note  05/03/2023 [name] [id number]  Chief Complaint:  Chief Complaint  Patient presents with   Psychiatric Evaluation   Visit Diagnosis: Schizophrenia and Bipolar (Per his history)  Michael Chandler is a 38 year old male who presents to the ER, after he was placed under IVC by his mother Michael [phone]). Per the IVC, "Respondent suffers from schizophrenia and bipolar. He has stated that he would kill his mother and himself today (05/02/2023). He is extremely aggressive and refuses to take prescribed medications. There are allegations that he is using meth in an attempt to self-medicate."  Per the patient, he and his mother had an argument and it resulted in her calling law enforcement. Patient states he wanted them to bring his mother to the ER but he was brought instead. Patient changed the subject when asked about his substance use and attempted to focus on his mother calling law enforcement. After he was redirected, he minimizes his substance use and stated his last time of use was approximately a week ago. He shared he uses, cannabis and methamphetamine. The UDS hadn't resulted by the time this consult was completed. During the interview, the patient was calm, cooperative and pleasant. He was able to provide appropriate answers to the questions. Throughout the interview he denied SI/HI and AV/H.    Patient Reported Information How did you hear about us ? Family/Friend  What Is the Reason for Your Visit/Call Today? Patient brought to the ER due to voicing SI and HI. He's not taking his medications for his mental health diagnosis.  How Long Has This Been Causing You Problems? 1 wk - 1 month  What Do You Feel Would Help You the Most Today? Treatment for Depression or other mood problem   Have You Recently Had Any Thoughts About Hurting Yourself? Yes  Are You Planning to Commit Suicide/Harm Yourself At  This time? No   Have you Recently Had Thoughts About Hurting Someone Sherral? Yes  Are You Planning to Harm Someone at This Time? No  Explanation: No data recorded  Have You Used Any Alcohol  or Drugs in the Past 24 Hours? No data recorded How Long Ago Did You Use Drugs or Alcohol ? No data recorded What Did You Use and How Much? No data recorded  Do You Currently Have a Therapist/Psychiatrist? No data recorded Name of Therapist/Psychiatrist: No data recorded  Have You Been Recently Discharged From Any Office Practice or Programs? No  Explanation of Discharge From Practice/Program: No data recorded   CCA Screening Triage Referral Assessment Type of Contact: Face-to-Face  Telemedicine Service Delivery:   Is this Initial or Reassessment?   Date Telepsych consult ordered in [facility]:    Time Telepsych consult ordered in [facility]:    Location of Assessment: [facility] ED  Provider Location: [facility] ED    Collateral Involvement: No data recorded  Does Patient Have a Court Appointed Legal Guardian? No data recorded Name and Contact of Legal Guardian: No data recorded If Minor and Not Living with Parent(s), Who has Custody? No data recorded Is CPS involved or ever been involved? Never  Is APS involved or ever been involved? Never   Patient Determined To Be At Risk for Harm To Self or Others Based on Review of Patient Reported Information or Presenting Complaint? Yes, for Self-Harm  Method: No data recorded Availability of Means: No data recorded Intent: No data recorded Notification Required: No data recorded Additional Information for Danger to Others Potential: No data  recorded Additional Comments for Danger to Others Potential: No data recorded Are There Guns or Other Weapons in Your Home? No  Types of Guns/Weapons: No data recorded Are These Weapons Safely Secured?                            No data recorded Who Could Verify You Are Able To Have These Secured: No data recorded Do You Have  any Outstanding Charges, Pending Court Dates, Parole/Probation? No data recorded Contacted To Inform of Risk of Harm To Self or Others: No data recorded  Does Patient Present under Involuntary Commitment? Yes   [place] of Residence: [place]   Patient Currently Receiving the Following Services: Not Receiving Services   Determination of Need: Emergent (2 hours)   Options For Referral: ED Visit   Disposition Recommendation per psychiatric provider: Pending Psych Consult  Kiki DOROTHA Barge MS, LCAS, [facility], [facility] Therapeutic Triage Specialist 05/03/2023 6:40 PM=

## 2023-05-03 NOTE — ED Notes (Signed)
IVC pending consult   

## 2023-05-03 NOTE — BH Assessment (Signed)
 Psych Teams is unable to complete consult at this time. Patient can not participate  in the interview.

## 2023-05-03 NOTE — ED Notes (Signed)
 IVC PENDING  CONSULT ?

## 2023-05-03 NOTE — ED Notes (Signed)
 Ivc/ psych consult pending

## 2023-05-03 NOTE — ED Provider Notes (Signed)
 Emergency Medicine Observation Re-evaluation Note  Michael Chandler is a 38 y.o. male, seen on rounds today.  Pt initially presented to the ED for complaints of Psychiatric Evaluation Currently, the patient is resting, voices no medical complaints.  Physical Exam  Pulse 100   Resp 20   SpO2 95%  Physical Exam General: Resting in no acute distress Cardiac: No cyanosis Lungs: Equal rise and fall Psych: Not agitated  ED Course / MDM  EKG:   I have reviewed the labs performed to date as well as medications administered while in observation.  Recent changes in the last 24 hours include no events overnight.  Plan  Current plan is for psychiatric disposition.    Michael Chandler J, MD 05/03/23 [phone]

## 2023-05-03 NOTE — ED Notes (Signed)
 Pt asleep at this time, snack and drink left at bedside

## 2023-05-03 NOTE — ED Notes (Signed)
 TTS in pt room with telepsych computer at this time.

## 2023-05-03 NOTE — ED Notes (Signed)
 Pts breakfast tray was left at bedside.

## 2023-05-03 NOTE — Consult Note (Signed)
 [facility] Consult Note  Patient Name: Michael Chandler MRN: [id number] DOB: 21-Aug-1985 DATE OF Consult: 05/03/2023  PRIMARY PSYCHIATRIC DIAGNOSES  - Schizophrenia  - Post-Traumatic Stress Disorder  - Depression  - Substance Use Disorder   RECOMMENDATIONS  Admit to inpatient psych for safety and stabilization. Medication recommendations: Start Zyprexa  5mg  PO QHS for mood stabilization. Non-Medication/therapeutic recommendations: Refer to outpatient psychiatric provider for medication management, therapy, and substance abuse treatment upon discharge from inpatient psych admission.   Thank you for involving us  in the care of this patient. If you have any additional questions or concerns, please call [phone] and ask for me or the provider on-call.  TELEPSYCHIATRY ATTESTATION & CONSENT  As the provider for this telehealth consult, I attest that I verified the patient's identity using two separate identifiers, introduced myself to the patient, provided my credentials, disclosed my location, and performed this encountpAer via a HIPAA-compliant, real-time, face-to-face, two-way, interactive audio and video platform and with the full consent and agreement of the patient (or guardian as applicable.)  Patient physical location: [place]. Telehealth provider physical location: home office in state of [place].   Video start time: 2123 [facility] Time) Video end time: 2145 (Central Time)  IDENTIFYING DATA  Michael Chandler is a 39 y.o. year-old male for whom a psychiatric consultation has been ordered by the primary provider. The patient was identified using two separate identifiers.  CHIEF COMPLAINT/REASON FOR CONSULT  - Behavioral health issues - Feeling misunderstood and not listened to - Desire to leave current living situation  HISTORY OF PRESENT ILLNESS (HPI)  The patient, a 38 year old male, presented with a complex history of behavioral health issues, including schizophrenia, PTSD, and  depression. It was noted that he resides with his mother, who has expressed a desire for him to leave the household. The patient reports that he is currently on a waiting list for housing, ranked 23rd, which adds to his stress and uncertainty about his living situation. During the session, he expressed feelings of frustration and anger, particularly towards the hospital staff, as he felt unheard and misunderstood. This anger had previously escalated to a point where medication was administered in the ER to manage his agitation.  He reported no current suicidal ideations or thoughts of harming others. However, there is a history of substance use, including recent methamphetamine use two days prior to the session, and occasional alcohol  and marijuana consumption. Despite these challenges, he denied experiencing auditory hallucinations or paranoia, although he mentioned a general sense of being disliked by others, which he attributed to his demeanor.  Regarding medication, the patient is unable to recall the medication that he is prescribed in the past. Though patient is currently denying suicidal and homicidal ideations, he presents as disorganized and paranoid and would benefit from inpatient admission.  PAST PSYCHIATRIC HISTORY   Diagnosed with schizophrenia, PTSD, and depression  PAST MEDICAL HISTORY  Past Medical History:  Diagnosis Date   HIV (human immunodeficiency virus infection) (HCC)    Schizophrenia (HCC)    per IVC paperwork, pt states does not have this dx     HOME MEDICATIONS  Facility Ordered Medications  Medication   [COMPLETED] amoxicillin -clavulanate (AUGMENTIN ) 875-125 MG per tablet 1 tablet   [COMPLETED] haloperidol  lactate (HALDOL ) injection 5 mg   [COMPLETED] LORazepam  (ATIVAN ) injection 2 mg   [COMPLETED] diphenhydrAMINE  (BENADRYL ) injection 50 mg   amoxicillin -clavulanate (AUGMENTIN ) 875-125 MG per tablet 1 tablet   PTA Medications  Medication Sig   ethambutol   (MYAMBUTOL ) 400 MG tablet Take  3 tablets (1,200 mg total) by mouth daily. (Patient not taking: Reported on 05/03/2023)   rifabutin  (MYCOBUTIN ) 150 MG capsule Take 2 capsules (300 mg total) by mouth daily. (Patient not taking: Reported on 05/03/2023)   naproxen  (NAPROSYN ) 250 MG tablet Take 1 tablet (250 mg total) by mouth 2 (two) times daily with a meal. (Patient not taking: Reported on 05/03/2023)   chlorhexidine (PERIDEX) 0.12 % solution SMARTSIG:By Mouth (Patient not taking: Reported on 07/25/2022)   dolutegravir  (TIVICAY ) 50 MG tablet Take 1 tablet (50 mg total) by mouth daily. (Patient not taking: Reported on 05/03/2023)   emtricitabine -tenofovir  AF (DESCOVY ) 200-25 MG tablet Take 1 tablet by mouth daily. (Patient not taking: Reported on 05/03/2023)     ALLERGIES  Allergies  Allergen Reactions   Bactrim [Sulfamethoxazole-Trimethoprim] Anaphylaxis   Shellfish Allergy Hives    SOCIAL & SUBSTANCE USE HISTORY  Social History   Socioeconomic History   Marital status: Single    Spouse name: Not on file   Number of children: Not on file   Years of education: Not on file   Highest education level: Not on file  Occupational History   Not on file  Tobacco Use   Smoking status: Former    Types: Cigarettes   Smokeless tobacco: Never   Tobacco comments:    Patient denies using tobacco products (03/13/22)  Substance and Sexual Activity   Alcohol  use: Yes    Comment: occasional   Drug use: Yes    Types: Methamphetamines, Marijuana, Cocaine    Comment: states occasional marijuana and meth use   Sexual activity: Not Currently    Comment: condoms accepted  Other Topics Concern   Not on file  Social History Narrative   Not on file   Social Drivers of Health   Financial Resource Strain: Low Risk  (12/29/2022)   Received from [employer]    Financial Concerns: none of these  Food Insecurity: Low Risk  (12/29/2022)   Received from [employer] Insecurity    Within the past 12 months, you worried that your food would run out before you got money to buy more.: Never True    Within the past 12 months, the food you bought just didn't last and you didn't have money to get more.: Never True  Transportation Needs: High Risk (12/29/2022)   Received from [employer] Needs    In the past 12 months, has lack of transportation kept you from medical appointments, meetings, work or from getting things needed for daily living?: Yes  Physical Activity: Not on file  Stress: Not on file  Social Connections: High Risk (02/14/2022)   Received from [facility], [facility]   Social Connections    How often do you see or talk to people that you care about and feel close to? (For example: talking to friends on the phone, visiting friends or family, going to church or club meetings): Less than once a week   Social History   Tobacco Use  Smoking Status Former   Types: Cigarettes  Smokeless Tobacco Never  Tobacco Comments   Patient denies using tobacco products (03/13/22)   Social History   Substance and Sexual Activity  Alcohol  Use Yes   Comment: occasional   Social History   Substance and Sexual Activity  Drug Use Yes   Types: Methamphetamines, Marijuana, Cocaine   Comment: states occasional marijuana and meth use  Additional pertinent information .  FAMILY HISTORY  History reviewed. No pertinent family history. Family Psychiatric History (if known):  unknown  MENTAL STATUS EXAM (MSE)  Mental Status Exam: General Appearance: Disheveled  Orientation:  Full (Time, Place, and Person)  Memory:  Immediate;   Fair Recent;   Fair Remote;   Fair  Concentration:  Concentration: Poor and Attention Span: Poor  Recall:  Fair  Attention  Poor  Eye Contact:  Poor  Speech:   incoherent  Language:  Fair  Volume:  Normal  Mood: The mood was described as  angry by the patient  Affect:  Congruent  Thought Process:  Disorganized  Thought Content:  Paranoid Ideation  Suicidal Thoughts:  No  Homicidal Thoughts:  No  Judgement:  Poor  Insight:  Lacking  Psychomotor Activity:  Negative  Akathisia:  Negative  Fund of Knowledge:  Poor    Assets:  Desire for Improvement Housing  Cognition:  Impaired,  Moderate  ADL's:  Impaired  AIMS (if indicated):       VITALS  Blood pressure 139/87, pulse 96, temperature 97.9 F (36.6 C), temperature source Oral, resp. rate 16, SpO2 99%.  LABS  Admission on 05/02/2023  Component Date Value Ref Range Status   Sodium 05/02/2023 138  135 - 145 mmol/L Final   Potassium 05/02/2023 3.3 (L)  3.5 - 5.1 mmol/L Final   Chloride 05/02/2023 108  98 - 111 mmol/L Final   CO2 05/02/2023 20 (L)  22 - 32 mmol/L Final   Glucose, Bld 05/02/2023 89  70 - 99 mg/dL Final   Glucose reference range applies only to samples taken after fasting for at least 8 hours.   BUN 05/02/2023 14  6 - 20 mg/dL Final   Creatinine, Ser 05/02/2023 1.04  0.61 - 1.24 mg/dL Final   Calcium 98/91/7974 7.8 (L)  8.9 - 10.3 mg/dL Final   Total Protein 98/91/7974 6.7  6.5 - 8.1 g/dL Final   Albumin 98/91/7974 3.2 (L)  3.5 - 5.0 g/dL Final   AST 98/91/7974 31  15 - 41 U/L Final   ALT 05/02/2023 21  0 - 44 U/L Final   Alkaline Phosphatase 05/02/2023 67  38 - 126 U/L Final   Total Bilirubin 05/02/2023 0.5  0.0 - 1.2 mg/dL Final   GFR, Estimated 05/02/2023 >60  >60 mL/min Final   Comment: (NOTE) Calculated using the CKD-EPI Creatinine Equation (2021)    Anion gap 05/02/2023 10  5 - 15 Final   Performed at [facility], [address] [place], [place] [address]   Alcohol , Ethyl (B) 05/02/2023 <10  <10 mg/dL Final   Comment: (NOTE) Lowest detectable limit for serum alcohol  is 10 mg/dL.  For medical purposes only. Performed at [facility], [address] [place], [place] [address]    Salicylate Lvl 05/02/2023 <7.0 (L)   7.0 - 30.0 mg/dL Final   Performed at [facility], [address] [place], [place] [address]   Acetaminophen  (Tylenol ), Serum 05/02/2023 <10 (L)  10 - 30 ug/mL Final   Comment: (NOTE) Therapeutic concentrations vary significantly. A range of 10-30 ug/mL  may be an effective concentration for many patients. However, some  are best treated at concentrations outside of this range. Acetaminophen  concentrations >150 ug/mL at 4 hours after ingestion  and >50 ug/mL at 12 hours after ingestion are often associated with  toxic reactions.  Performed at [facility], [address], [place], [place] [address]    WBC  05/02/2023 4.1  4.0 - 10.5 K/uL Final   RBC 05/02/2023 3.52 (L)  4.22 - 5.81 MIL/uL Final   Hemoglobin 05/02/2023 9.8 (L)  13.0 - 17.0 g/dL Final   HCT 98/91/7974 29.8 (L)  39.0 - 52.0 % Final   MCV 05/02/2023 84.7  80.0 - 100.0 fL Final   MCH 05/02/2023 27.8  26.0 - 34.0 pg Final   MCHC 05/02/2023 32.9  30.0 - 36.0 g/dL Final   RDW 98/91/7974 14.2  11.5 - 15.5 % Final   Platelets 05/02/2023 223  150 - 400 K/uL Final   nRBC 05/02/2023 0.0  0.0 - 0.2 % Final   Performed at [facility], [address] [place], [place] [address]    PSYCHIATRIC REVIEW OF SYSTEMS (ROS)  ROS: Notable for the following relevant positive findings: ROS  Additional findings:      Musculoskeletal: Impaired      Gait & Station: Laying/Sitting      Pain Screening: Present - mild to moderate      Nutrition & Dental Concerns: Decrease in food intake and/or loss of appetite  RISK FORMULATION/ASSESSMENT  Is the patient experiencing any suicidal or homicidal ideations: No       Explain if yes:  Protective factors considered for safety management: access to appropriate clinical intervention  Risk factors/concerns considered for safety management:  Depression Substance abuse/dependence Impulsivity Aggression Male gender Unmarried  Is there a safety management plan with  the patient and treatment team to minimize risk factors and promote protective factors: Yes           Explain: admit to psych Is crisis care placement or psychiatric hospitalization recommended: Yes     Based on my current evaluation and risk assessment, patient is determined at this time to be at:  Moderate Risk  *RISK ASSESSMENT Risk assessment is a dynamic process; it is possible that this patient's condition, and risk level, may change. This should be re-evaluated and managed over time as appropriate. Please re-consult psychiatric consult services if additional assistance is needed in terms of risk assessment and management. If your team decides to discharge this patient, please advise the patient how to best access emergency psychiatric services, or to call 911, if their condition worsens or they feel unsafe in any way.  The patient exhibits signs of distress and frustration, feeling unheard and misunderstood by those around him. He denies experiencing hallucinations or delusions at present, but his demeanor suggests underlying anxiety and agitation. The patient has not articulated specific coping strategies and seems to struggle with interpersonal relationships, feeling disliked by others. His engagement in therapy or past therapeutic experiences was not discussed, leaving a gap in understanding his psychological resilience and coping mechanisms   Saban Heinlen Velna, NP Telepsychiatry Consult Services

## 2023-05-03 NOTE — ED Notes (Signed)
 This RN offered patient scheduled antibiotic ordered by MD- patient states "I don't need antibiotics, thank you though".

## 2023-05-04 ENCOUNTER — Other Ambulatory Visit: Payer: Self-pay

## 2023-05-04 ENCOUNTER — Encounter: Payer: Self-pay | Admitting: Psychiatry

## 2023-05-04 ENCOUNTER — Inpatient Hospital Stay
Admission: AD | Admit: 2023-05-04 | Discharge: 2023-05-08 | DRG: 885 | Disposition: A | Discharge status: Home / Self Care | Payer: Medicare Other | Source: Intra-hospital | Attending: Psychiatry | Admitting: Psychiatry

## 2023-05-04 DIAGNOSIS — R4586 Emotional lability: Secondary | ICD-10-CM | POA: Diagnosis present

## 2023-05-04 DIAGNOSIS — F419 Anxiety disorder, unspecified: Secondary | ICD-10-CM | POA: Diagnosis present

## 2023-05-04 DIAGNOSIS — Z21 Asymptomatic human immunodeficiency virus [HIV] infection status: Secondary | ICD-10-CM | POA: Diagnosis present

## 2023-05-04 DIAGNOSIS — Z882 Allergy status to sulfonamides status: Secondary | ICD-10-CM

## 2023-05-04 DIAGNOSIS — F1510 Other stimulant abuse, uncomplicated: Secondary | ICD-10-CM | POA: Diagnosis present

## 2023-05-04 DIAGNOSIS — R45851 Suicidal ideations: Secondary | ICD-10-CM | POA: Diagnosis present

## 2023-05-04 DIAGNOSIS — F203 Undifferentiated schizophrenia: Principal | ICD-10-CM | POA: Diagnosis present

## 2023-05-04 DIAGNOSIS — F1590 Other stimulant use, unspecified, uncomplicated: Secondary | ICD-10-CM | POA: Diagnosis present

## 2023-05-04 DIAGNOSIS — G4700 Insomnia, unspecified: Secondary | ICD-10-CM | POA: Diagnosis present

## 2023-05-04 DIAGNOSIS — Z91013 Allergy to seafood: Secondary | ICD-10-CM | POA: Diagnosis not present

## 2023-05-04 DIAGNOSIS — F1290 Cannabis use, unspecified, uncomplicated: Secondary | ICD-10-CM | POA: Diagnosis present

## 2023-05-04 DIAGNOSIS — Z91148 Patient's other noncompliance with medication regimen for other reason: Secondary | ICD-10-CM | POA: Diagnosis not present

## 2023-05-04 DIAGNOSIS — Z87891 Personal history of nicotine dependence: Secondary | ICD-10-CM

## 2023-05-04 LAB — URINE DRUG SCREEN, QUALITATIVE (ARMC ONLY)
Amphetamines, Ur Screen: POSITIVE — AB
Barbiturates, Ur Screen: NOT DETECTED
Benzodiazepine, Ur Scrn: NOT DETECTED
Cannabinoid 50 Ng, Ur ~~LOC~~: NOT DETECTED
Cocaine Metabolite,Ur ~~LOC~~: NOT DETECTED
MDMA (Ecstasy)Ur Screen: NOT DETECTED
Methadone Scn, Ur: NOT DETECTED
Opiate, Ur Screen: NOT DETECTED
Phencyclidine (PCP) Ur S: NOT DETECTED
Tricyclic, Ur Screen: NOT DETECTED

## 2023-05-04 MED ORDER — ALUM & MAG HYDROXIDE-SIMETH 200-200-20 MG/5ML PO SUSP: 30.0000 mL | ORAL | Status: DC | PRN

## 2023-05-04 MED ORDER — AMOXICILLIN-POT CLAVULANATE 875-125 MG PO TABS
1.0000 | ORAL_TABLET | Freq: Two times a day (BID) | ORAL | Status: AC
  Administered 2023-05-05 – 2023-05-07 (×3): 1 via ORAL
  Filled 2023-05-04 (×6): qty 1

## 2023-05-04 MED ORDER — HALOPERIDOL LACTATE 5 MG/ML IJ SOLN
5.0000 mg | Freq: Three times a day (TID) | INTRAMUSCULAR | Status: DC | PRN
  Filled 2023-05-04: qty 1

## 2023-05-04 MED ORDER — DOLUTEGRAVIR SODIUM 50 MG PO TABS
50.0000 mg | ORAL_TABLET | Freq: Every day | ORAL | Status: DC
  Administered 2023-05-05 – 2023-05-08 (×4): 50 mg via ORAL
  Filled 2023-05-04 (×5): qty 1

## 2023-05-04 MED ORDER — DIPHENHYDRAMINE HCL 25 MG PO CAPS
50.0000 mg | ORAL_CAPSULE | Freq: Three times a day (TID) | ORAL | Status: DC | PRN
  Administered 2023-05-04 – 2023-05-07 (×2): 50 mg via ORAL
  Filled 2023-05-04 (×2): qty 2

## 2023-05-04 MED ORDER — TRAZODONE HCL 50 MG PO TABS
50.0000 mg | ORAL_TABLET | Freq: Every evening | ORAL | Status: DC | PRN
  Administered 2023-05-07: 50 mg via ORAL
  Filled 2023-05-04 (×2): qty 1

## 2023-05-04 MED ORDER — BENZTROPINE MESYLATE 1 MG PO TABS
1.0000 mg | ORAL_TABLET | Freq: Every day | ORAL | Status: DC
  Administered 2023-05-04 – 2023-05-08 (×5): 1 mg via ORAL
  Filled 2023-05-04 (×5): qty 1

## 2023-05-04 MED ORDER — LORAZEPAM 2 MG/ML IJ SOLN
2.0000 mg | Freq: Three times a day (TID) | INTRAMUSCULAR | Status: DC | PRN
  Filled 2023-05-04: qty 1

## 2023-05-04 MED ORDER — HALOPERIDOL 1 MG PO TABS
2.0000 mg | ORAL_TABLET | Freq: Two times a day (BID) | ORAL | Status: DC
  Administered 2023-05-04 – 2023-05-08 (×9): 2 mg via ORAL
  Filled 2023-05-04 (×9): qty 2

## 2023-05-04 MED ORDER — DIPHENHYDRAMINE HCL 50 MG/ML IJ SOLN: 50.0000 mg | Freq: Three times a day (TID) | INTRAMUSCULAR | Status: DC | PRN

## 2023-05-04 MED ORDER — MAGNESIUM HYDROXIDE 400 MG/5ML PO SUSP: 30.0000 mL | Freq: Every day | ORAL | Status: DC | PRN

## 2023-05-04 MED ORDER — HALOPERIDOL 5 MG PO TABS
5.0000 mg | ORAL_TABLET | Freq: Three times a day (TID) | ORAL | Status: DC | PRN
  Administered 2023-05-07: 5 mg via ORAL
  Filled 2023-05-04: qty 1

## 2023-05-04 MED ORDER — GABAPENTIN 300 MG PO CAPS
300.0000 mg | ORAL_CAPSULE | Freq: Three times a day (TID) | ORAL | Status: DC
  Administered 2023-05-04 – 2023-05-08 (×13): 300 mg via ORAL
  Filled 2023-05-04 (×13): qty 1

## 2023-05-04 MED ORDER — LORAZEPAM 2 MG/ML IJ SOLN: 2.0000 mg | Freq: Three times a day (TID) | INTRAMUSCULAR | Status: DC | PRN

## 2023-05-04 MED ORDER — HYDROXYZINE HCL 25 MG PO TABS: 25.0000 mg | ORAL_TABLET | Freq: Three times a day (TID) | ORAL | Status: DC | PRN

## 2023-05-04 MED ORDER — LORAZEPAM 2 MG PO TABS
2.0000 mg | ORAL_TABLET | Freq: Once | ORAL | Status: AC
  Administered 2023-05-04: 2 mg via ORAL
  Filled 2023-05-04: qty 1

## 2023-05-04 MED ORDER — DIPHENHYDRAMINE HCL 50 MG/ML IJ SOLN
50.0000 mg | Freq: Three times a day (TID) | INTRAMUSCULAR | Status: DC | PRN
  Filled 2023-05-04: qty 1

## 2023-05-04 MED ORDER — MELATONIN 5 MG PO TABS
5.0000 mg | ORAL_TABLET | Freq: Every day | ORAL | Status: DC
  Administered 2023-05-07: 5 mg via ORAL
  Filled 2023-05-04 (×2): qty 1

## 2023-05-04 MED ORDER — ACETAMINOPHEN 325 MG PO TABS
650.0000 mg | ORAL_TABLET | Freq: Four times a day (QID) | ORAL | Status: DC | PRN
  Administered 2023-05-08 (×2): 650 mg via ORAL
  Filled 2023-05-04 (×2): qty 2

## 2023-05-04 MED ORDER — HALOPERIDOL LACTATE 5 MG/ML IJ SOLN: 10.0000 mg | Freq: Three times a day (TID) | INTRAMUSCULAR | Status: DC | PRN

## 2023-05-04 NOTE — Plan of Care (Signed)
 New Admission Problem: Activity: Goal: Interest or engagement in activities will improve Outcome: Not Progressing Goal: Sleeping patterns will improve Outcome: Not Progressing   Problem: Education: Goal: Knowledge of [place] [facility] Education information/materials will improve Outcome: Not Progressing Goal: Emotional status will improve Outcome: Not Progressing Goal: Mental status will improve Outcome: Not Progressing

## 2023-05-04 NOTE — Group Note (Signed)
 Recreation Therapy Group Note   Group Topic:Leisure Education  Group Date: 05/04/2023 Start Time: 1000 End Time: 1100 Facilitators: Celestia Jeoffrey BRAVO, LRT, CTRS Location:  Craft Room  Group Description: Leisure. Patients were given the option to choose from singing karaoke, coloring mandalas, using oil pastels, journaling, or playing with play-doh. LRT and pts discussed the meaning of leisure, the importance of participating in leisure during their free time/when they're outside of the hospital, as well as how our leisure interests can also serve as coping skills.   Goal Area(s) Addressed:  Patient will identify a current leisure interest.  Patient will learn the definition of "leisure". Patient will practice making a positive decision. Patient will have the opportunity to try a new leisure activity. Patient will communicate with peers and LRT.    Affect/Mood: N/A   Participation Level: Did not attend    Clinical Observations/Individualized Feedback: Patient did not attend group.   Plan: Continue to engage patient in RT group sessions 2-3x/week.   Jeoffrey BRAVO Celestia, LRT, CTRS 05/04/2023 12:13 PM

## 2023-05-04 NOTE — Group Note (Signed)
 Date:  05/04/2023 Time:  10:33 AM  Group Topic/Focus:  Goals Group:   The focus of this group is to help patients establish daily goals to achieve during treatment and discuss how the patient can incorporate goal setting into their daily lives to aide in recovery.    Participation Level:  Did Not Attend   Deitra Clap [facility] 05/04/2023, 10:33 AM

## 2023-05-04 NOTE — BHH Counselor (Signed)
 CSW attempted to meet with pt for completion of the PSA. Pt was sleeping soundly and did not respond to knock of his name being called. CSW will attempt completion of assessment at a later time.   Vilma Meckel. Algis Greenhouse, MSW, LCSW, LCAS 05/04/2023 10:51 AM

## 2023-05-04 NOTE — BH IP Treatment Plan (Signed)
 Interdisciplinary Treatment and Diagnostic Plan Update  05/04/2023 Time of Session: 09:29 Michael Chandler MRN: [id number]  Principal Diagnosis: Suicidal ideation  Secondary Diagnoses: Principal Problem:   Suicidal ideation   Current Medications:  Current Facility-Administered Medications  Medication Dose Route Frequency Provider Last Rate Last Admin   acetaminophen  (TYLENOL ) tablet 650 mg  650 mg Oral Q6H PRN McLauchlin, Angela, NP       alum & mag hydroxide-simeth (MAALOX/MYLANTA) 200-200-20 MG/5ML suspension 30 mL  30 mL Oral Q4H PRN McLauchlin, Angela, NP       haloperidol  (HALDOL ) tablet 5 mg  5 mg Oral TID PRN McLauchlin, Angela, NP       And   diphenhydrAMINE  (BENADRYL ) capsule 50 mg  50 mg Oral TID PRN McLauchlin, Angela, NP       haloperidol  lactate (HALDOL ) injection 5 mg  5 mg Intramuscular TID PRN McLauchlin, Angela, NP       And   diphenhydrAMINE  (BENADRYL ) injection 50 mg  50 mg Intramuscular TID PRN McLauchlin, Angela, NP       And   LORazepam  (ATIVAN ) injection 2 mg  2 mg Intramuscular TID PRN McLauchlin, Angela, NP       haloperidol  lactate (HALDOL ) injection 10 mg  10 mg Intramuscular TID PRN McLauchlin, Angela, NP       And   diphenhydrAMINE  (BENADRYL ) injection 50 mg  50 mg Intramuscular TID PRN McLauchlin, Angela, NP       And   LORazepam  (ATIVAN ) injection 2 mg  2 mg Intramuscular TID PRN McLauchlin, Angela, NP       hydrOXYzine  (ATARAX ) tablet 25 mg  25 mg Oral TID PRN McLauchlin, Angela, NP       magnesium  hydroxide (MILK OF MAGNESIA) suspension 30 mL  30 mL Oral Daily PRN McLauchlin, Angela, NP       traZODone  (DESYREL ) tablet 50 mg  50 mg Oral QHS PRN McLauchlin, Angela, NP       PTA Medications: Medications Prior to Admission  Medication Sig Dispense Refill Last Dose/Taking   amoxicillin -clavulanate (AUGMENTIN ) 875-125 MG tablet Take 1 tablet by mouth 2 (two) times daily for 10 days. (Patient not taking: Reported on 05/03/2023) 20 tablet 0    benztropine   (COGENTIN ) 1 MG tablet Take 1 tablet (1 mg total) by mouth daily. 30 tablet 0    chlorhexidine (PERIDEX) 0.12 % solution SMARTSIG:By Mouth (Patient not taking: Reported on 07/25/2022)      dolutegravir  (TIVICAY ) 50 MG tablet Take 1 tablet (50 mg total) by mouth daily. (Patient not taking: Reported on 05/03/2023) 30 tablet 3    emtricitabine -tenofovir  AF (DESCOVY ) 200-25 MG tablet Take 1 tablet by mouth daily. (Patient not taking: Reported on 05/03/2023) 30 tablet 3    ethambutol  (MYAMBUTOL ) 400 MG tablet Take 3 tablets (1,200 mg total) by mouth daily. (Patient not taking: Reported on 05/03/2023) 90 tablet 3    gabapentin  (NEURONTIN ) 300 MG capsule Take 1 capsule (300 mg total) by mouth 3 (three) times daily. (Patient not taking: Reported on 07/25/2022) 90 capsule 0    haloperidol  (HALDOL ) 5 MG tablet Take 1 tablet (5 mg total) by mouth 2 (two) times daily. (Patient not taking: Reported on 07/25/2022) 60 tablet 0    naproxen  (NAPROSYN ) 250 MG tablet Take 1 tablet (250 mg total) by mouth 2 (two) times daily with a meal. (Patient not taking: Reported on 05/03/2023) 60 tablet 0    rifabutin  (MYCOBUTIN ) 150 MG capsule Take 2 capsules (300 mg total) by mouth daily. (Patient not  taking: Reported on 05/03/2023) 60 capsule 3    traZODone  (DESYREL ) 150 MG tablet Take 1 tablet (150 mg total) by mouth at bedtime. 30 tablet 0     Patient Stressors: Traumatic event    Patient Strengths: Motivation for treatment/growth   Treatment Modalities: Medication Management, Group therapy, Case management,  1 to 1 session with clinician, Psychoeducation, Recreational therapy.   Physician Treatment Plan for Primary Diagnosis: Suicidal ideation Long Term Goal(s):     Short Term Goals:    Medication Management: Evaluate patient's response, side effects, and tolerance of medication regimen.  Therapeutic Interventions: 1 to 1 sessions, Unit Group sessions and Medication administration.  Evaluation of Outcomes: Not Met  Physician  Treatment Plan for Secondary Diagnosis: Principal Problem:   Suicidal ideation  Long Term Goal(s):     Short Term Goals:       Medication Management: Evaluate patient's response, side effects, and tolerance of medication regimen.  Therapeutic Interventions: 1 to 1 sessions, Unit Group sessions and Medication administration.  Evaluation of Outcomes: Not Met   RN Treatment Plan for Primary Diagnosis: Suicidal ideation Long Term Goal(s): Knowledge of disease and therapeutic regimen to maintain health will improve  Short Term Goals: Ability to remain free from injury will improve, Ability to verbalize frustration and anger appropriately will improve, Ability to demonstrate self-control, Ability to participate in decision making will improve, Ability to verbalize feelings will improve, Ability to disclose and discuss suicidal ideas, Ability to identify and develop effective coping behaviors will improve, and Compliance with prescribed medications will improve  Medication Management: RN will administer medications as ordered by provider, will assess and evaluate patient's response and provide education to patient for prescribed medication. RN will report any adverse and/or side effects to prescribing provider.  Therapeutic Interventions: 1 on 1 counseling sessions, Psychoeducation, Medication administration, Evaluate responses to treatment, Monitor vital signs and CBGs as ordered, Perform/monitor CIWA, COWS, AIMS and Fall Risk screenings as ordered, Perform wound care treatments as ordered.  Evaluation of Outcomes: Not Met   LCSW Treatment Plan for Primary Diagnosis: Suicidal ideation Long Term Goal(s): Safe transition to appropriate next level of care at discharge, Engage patient in therapeutic group addressing interpersonal concerns.  Short Term Goals: Engage patient in aftercare planning with referrals and resources, Increase social support, Increase ability to appropriately verbalize  feelings, Increase emotional regulation, Facilitate acceptance of mental health diagnosis and concerns, Facilitate patient progression through stages of change regarding substance use diagnoses and concerns, Identify triggers associated with mental health/substance abuse issues, and Increase skills for wellness and recovery  Therapeutic Interventions: Assess for all discharge needs, 1 to 1 time with Social worker, Explore available resources and support systems, Assess for adequacy in community support network, Educate family and significant other(s) on suicide prevention, Complete Psychosocial Assessment, Interpersonal group therapy.  Evaluation of Outcomes: Not Met   Progress in Treatment: Attending groups: No. Participating in groups: No. Taking medication as prescribed: Yes. Toleration medication: Yes. Family/Significant other contact made: No, will contact:  when given permission.  Patient understands diagnosis: Yes. Discussing patient identified problems/goals with staff: Yes. Medical problems stabilized or resolved: Yes. Denies suicidal/homicidal ideation: Yes. Issues/concerns per patient self-inventory: No. Other: none.  New problem(s) identified: No, Describe:  none identified.  New Short Term/Long Term Goal(s): detox, elimination of symptoms of psychosis, medication management for mood stabilization; elimination of SI thoughts; development of comprehensive mental wellness/sobriety plan.  Patient Goals:  For everybody to be ok.  Discharge Plan or Barriers: CSW will  assist pt with development of an appropriate aftercare/discharge plan.   Reason for Continuation of Hospitalization: Hallucinations Medication stabilization  Estimated Length of Stay: 1-7 days  Last 3 [place] Suicide Severity Risk Score: Flowsheet Row Admission (Current) from 05/04/2023 in [facility] INPATIENT BEHAVIORAL MEDICINE Most recent reading at 05/04/2023  2:00 AM ED from 05/02/2023 in [facility] at [facility] Most recent reading at 05/03/2023  6:34 PM ED from 05/02/2023 in [facility] Emergency Department at [facility] Most recent reading at 05/02/2023  9:11 AM  C-SSRS RISK CATEGORY No Risk No Risk No Risk       Last PHQ 2/9 Scores:    04/20/2022    9:53 AM 03/13/2022   10:27 AM 03/13/2022   10:19 AM  Depression screen PHQ 2/9  Decreased Interest 0 0 0  Down, Depressed, Hopeless 0 0 0  PHQ - 2 Score 0 0 0    Scribe for Treatment Team: Nadara JONELLE Fam, LCSW 05/04/2023 9:56 AM

## 2023-05-04 NOTE — ED Provider Notes (Signed)
 Emergency Medicine Observation Re-evaluation Note  Michael Chandler is a 38 y.o. male, seen on rounds today.  Pt initially presented to the ED for complaints of Psychiatric Evaluation  Currently, the patient is is no acute distress. Denies any concerns at this time.  Physical Exam  Blood pressure 139/87, pulse 96, temperature 97.9 F (36.6 C), temperature source Oral, resp. rate 16, SpO2 99%.  Physical Exam: General: No apparent distress      ED Course / MDM     I have reviewed the labs performed to date as well as medications administered while in observation.  Recent changes in the last 24 hours include: No acute events overnight.  Patient was accepted to [facility] behavioral health unit  Plan   Current plan: Patient awaiting psychiatric disposition.  Accepted to behavioral health unit Patient is under full IVC at this time.    Suzanne Kirsch, MD 05/04/23 [phone]

## 2023-05-04 NOTE — BH Assessment (Signed)
.  Admission Note:  38 yr male who presents IVC in no acute distress. Pt appears flat and depressed. Pt was calm and cooperative with admission process. Pt contracts for safety upon admission. Pt denies AVH. Skin was assessed. PT searched and no contraband found, POC and unit policies explained and understanding verbalized. Consents obtained. Food and fluids offered, and fluids accepted. Pt had no additional questions or concerns.

## 2023-05-04 NOTE — Plan of Care (Signed)
 Patient stayed in bed most of the shift. Patient disorganized and got agitated this afternoon. Patient talking loud in his room. Patient willingly took medications to make him relax.Support and encouragement given. Patient denies SI,HI and AVH. Patient calm down and in bed at this time.

## 2023-05-04 NOTE — Tx Team (Signed)
 Initial Treatment Plan 05/04/2023 2:45 AM Michael Chandler FMW:[id number]    PATIENT STRESSORS: Traumatic event    PATIENT STRENGTHS: Motivation for treatment/growth    PATIENT IDENTIFIED PROBLEMS: Substance Use Disorder   Depression                    DISCHARGE CRITERIA:  Improved stabilization in mood, thinking, and/or behavior Motivation to continue treatment in a less acute level of care Verbal commitment to aftercare and medication compliance  PRELIMINARY DISCHARGE PLAN: Attend aftercare/continuing care group Return to previous living arrangement  PATIENT/FAMILY INVOLVEMENT: This treatment plan has been presented to and reviewed with the patient, Michael Chandler. The patient and family have been given the opportunity to ask questions and make suggestions.  Tonna DELENA Home, RN 05/04/2023, 2:45 AM

## 2023-05-04 NOTE — BH Assessment (Signed)
 Patient is to be admitted to [facility] by Psychiatric Nurse Practitioner  Sherifat, F .  Attending Physician will be Dr. Cam.   Patient has been assigned to room 319, by [facility] Charge Nurse Raycha.   Intake Paper Work has been signed and placed on patient chart.  ER staff is aware of the admission: Rhashenna, ER Secretary   Dr. Suzanne, ER MD  Caron, Patient's Nurse  Verneita, Patient Access.  Pt can arrive 05/04/23 ASAP.

## 2023-05-04 NOTE — H&P (Signed)
 Psychiatric Admission Assessment Adult  Patient Identification: Michael Chandler MRN:  [id number] Date of Evaluation:  05/04/2023 Chief Complaint:  Suicidal ideation [R45.851] Principal Diagnosis: Schizophrenia, undifferentiated (HCC) Diagnosis:  Principal Problem:   Schizophrenia, undifferentiated (HCC) Active Problems:   Methamphetamine abuse (HCC)  History of Present Illness: 38 year old African American male who presented to the emergency department under involuntary commitment (IVC) initiated by his mother, Zackary Mckeone [phone]). Per the IVC documentation, the patient has a history of schizophrenia and bipolar disorder, with recent threats to harm both himself and his mother. His mother reported that the patient has been extremely aggressive, non-compliant with prescribed medications, and has been self-medicating with methamphetamine.The patient acknowledges an argument with his mother, which he states led to her contacting law enforcement. He denies making any threats and claims he intended for his mother to be brought to the ER rather than himself. He minimized his substance use, reporting his last use of methamphetamine as approximately one week ago and admitting to occasional cannabis use. However, he avoided detailed discussions about his substance use and redirected conversations back to his frustration with the IVC and his mother's involvement. A urine drug screen (UDS) is pending to confirm recent substance use.During the ER stay, the patient displayed agitation that required medication to manage but is now calm and cooperative. He denies suicidal ideation (SI), homicidal ideation (HI), and auditory or visual hallucinations (AVH) during the interview. Despite these denials, he demonstrates limited insight, impaired judgment, and disorganized thought patterns, consistent with his psychiatric history. He also expressed feelings of frustration and anger toward hospital staff and his  mother, citing ongoing stress about his unstable living situation.  The patient is unable to recall the medications he was prescribed previously and admits to long-standing medication non-compliance. Associated Signs/Symptoms: Depression Symptoms:  insomnia, suicidal thoughts without plan, anxiety, (Hypo) Manic Symptoms:  Impulsivity, Anxiety Symptoms:   NONE NOTED Psychotic Symptoms:  Hallucinations: Auditory Paranoia, PTSD Symptoms: Negative Total Time spent with patient: 2.5 hours  Past Psychiatric History: schizophrenia   Is the patient at risk to self? Yes.    Has the patient been a risk to self in the past 6 months? Yes.    Has the patient been a risk to self within the distant past? Yes.    Is the patient a risk to others? Yes.    Has the patient been a risk to others in the past 6 months? Yes.    Has the patient been a risk to others within the distant past? Yes.     [place]ia Scale:  Flowsheet Row Admission (Current) from 05/04/2023 in [facility] INPATIENT BEHAVIORAL MEDICINE Most recent reading at 05/04/2023  2:00 AM ED from 05/02/2023 in [facility] Emergency Department at [facility] Most recent reading at 05/03/2023  6:34 PM ED from 05/02/2023 in [facility] Emergency Department at [facility] Most recent reading at 05/02/2023  9:11 AM  C-SSRS RISK CATEGORY No Risk No Risk No Risk        Prior Inpatient Therapy: Yes.   If yes, describe Months ago  Prior Outpatient Therapy: Yes.   If yes, describe has not attended   Alcohol  Screening: 1. How often do you have a drink containing alcohol ?: 2 to 4 times a month 2. How many drinks containing alcohol  do you have on a typical day when you are drinking?: 1 or 2 3. How often do you have six or more drinks on one occasion?: Less than monthly AUDIT-C Score: 3 4. How often  during the last year have you found that you were not able to stop drinking once you had started?: Never 5. How often during the last year have you failed to  do what was normally expected from you because of drinking?: Never 6. How often during the last year have you needed a first drink in the morning to get yourself going after a heavy drinking session?: Never 7. How often during the last year have you had a feeling of guilt of remorse after drinking?: Never 8. How often during the last year have you been unable to remember what happened the night before because you had been drinking?: Never 9. Have you or someone else been injured as a result of your drinking?: No 10. Has a relative or friend or a doctor or another health worker been concerned about your drinking or suggested you cut down?: No Alcohol  Use Disorder Identification Test Final Score (AUDIT): 3 Alcohol  Brief Interventions/Follow-up: Alcohol  education/Brief advice Substance Abuse History in the last 12 months:  Yes.   Consequences of Substance Abuse: Negative Previous Psychotropic Medications: Yes  Psychological Evaluations: No  Past Medical History:  Past Medical History:  Diagnosis Date   HIV (human immunodeficiency virus infection) (HCC)    Schizophrenia (HCC)    per IVC paperwork, pt states does not have this dx    Past Surgical History:  Procedure Laterality Date   IR US  GUIDE BX ASP/DRAIN  03/21/2022   Family History: History reviewed. No pertinent family history. Family Psychiatric  History: none reported Tobacco Screening:  Social History   Tobacco Use  Smoking Status Former   Types: Cigarettes  Smokeless Tobacco Never  Tobacco Comments   Patient denies using tobacco products (03/13/22)    BH Tobacco Counseling     Are you interested in Tobacco Cessation Medications?  N/A, patient does not use tobacco products Counseled patient on smoking cessation:  N/A, patient does not use tobacco products Reason Tobacco Screening Not Completed: Patient Refused Screening       Social History:  Social History   Substance and Sexual Activity  Alcohol  Use Yes    Comment: occasional     Social History   Substance and Sexual Activity  Drug Use Yes   Types: Methamphetamines, Marijuana, Cocaine   Comment: states occasional marijuana and meth use    Additional Social History:                           Allergies:   Allergies  Allergen Reactions   Bactrim [Sulfamethoxazole-Trimethoprim] Anaphylaxis   Shellfish Allergy Hives   Lab Results:  Results for orders placed or performed during the hospital encounter of 05/04/23 (from the past 48 hours)  Urine Drug Screen, Qualitative ([facility] only)     Status: Abnormal   Collection Time: 05/04/23  5:00 PM  Result Value Ref Range   Tricyclic, Ur Screen NONE DETECTED NONE DETECTED   Amphetamines, Ur Screen POSITIVE (A) NONE DETECTED   MDMA (Ecstasy)Ur Screen NONE DETECTED NONE DETECTED   Cocaine Metabolite,Ur [place] NONE DETECTED NONE DETECTED   Opiate, Ur Screen NONE DETECTED NONE DETECTED   Phencyclidine (PCP) Ur S NONE DETECTED NONE DETECTED   Cannabinoid 50 Ng, Ur [place] NONE DETECTED NONE DETECTED   Barbiturates, Ur Screen NONE DETECTED NONE DETECTED   Benzodiazepine, Ur Scrn NONE DETECTED NONE DETECTED   Methadone Scn, Ur NONE DETECTED NONE DETECTED    Comment: (NOTE) Tricyclics + metabolites, urine  Cutoff 1000 ng/mL Amphetamines + metabolites, urine  Cutoff 1000 ng/mL MDMA (Ecstasy), urine              Cutoff 500 ng/mL Cocaine Metabolite, urine          Cutoff 300 ng/mL Opiate + metabolites, urine        Cutoff 300 ng/mL Phencyclidine (PCP), urine         Cutoff 25 ng/mL Cannabinoid, urine                 Cutoff 50 ng/mL Barbiturates + metabolites, urine  Cutoff 200 ng/mL Benzodiazepine, urine              Cutoff 200 ng/mL Methadone, urine                   Cutoff 300 ng/mL  The urine drug screen provides only a preliminary, unconfirmed analytical test result and should not be used for non-medical purposes. Clinical consideration and professional judgment should be applied to any  positive drug screen result due to possible interfering substances. A more specific alternate chemical method must be used in order to obtain a confirmed analytical result. Gas chromatography / mass spectrometry (GC/MS) is the preferred confirm atory method. Performed at [facility], [address] [place], [place] [address]     Blood Alcohol  level:  Lab Results  Component Value Date   [facility] <10 05/02/2023   ETH <10 07/03/2022    Metabolic Disorder Labs:  Lab Results  Component Value Date   HGBA1C 5.7 (H) 03/04/2022   MPG 116.89 03/04/2022   MPG 131.24 12/18/2021   No results found for: PROLACTIN Lab Results  Component Value Date   CHOL 164 03/13/2022   TRIG 172 (H) 03/13/2022   HDL 45 03/13/2022   CHOLHDL 3.6 03/13/2022   VLDL 50 (H) 03/04/2022   LDLCALC 92 03/13/2022   LDLCALC 89 03/04/2022    Current Medications: Current Facility-Administered Medications  Medication Dose Route Frequency Provider Last Rate Last Admin   acetaminophen  (TYLENOL ) tablet 650 mg  650 mg Oral Q6H PRN McLauchlin, Jon, NP       alum & mag hydroxide-simeth (MAALOX/MYLANTA) 200-200-20 MG/5ML suspension 30 mL  30 mL Oral Q4H PRN McLauchlin, Angela, NP       amoxicillin -clavulanate (AUGMENTIN ) 875-125 MG per tablet 1 tablet  1 tablet Oral Q12H Nicholaus Brad RAMAN, NP       benztropine  (COGENTIN ) tablet 1 mg  1 mg Oral Daily Nicholaus Brad RAMAN, NP   1 mg at 05/04/23 1613   haloperidol  (HALDOL ) tablet 5 mg  5 mg Oral TID PRN McLauchlin, Angela, NP       And   diphenhydrAMINE  (BENADRYL ) capsule 50 mg  50 mg Oral TID PRN McLauchlin, Angela, NP   50 mg at 05/04/23 1701   haloperidol  lactate (HALDOL ) injection 5 mg  5 mg Intramuscular TID PRN McLauchlin, Jon, NP       And   diphenhydrAMINE  (BENADRYL ) injection 50 mg  50 mg Intramuscular TID PRN McLauchlin, Jon, NP       And   LORazepam  (ATIVAN ) injection 2 mg  2 mg Intramuscular TID PRN McLauchlin, Jon, NP       haloperidol  lactate  (HALDOL ) injection 10 mg  10 mg Intramuscular TID PRN McLauchlin, Angela, NP       And   diphenhydrAMINE  (BENADRYL ) injection 50 mg  50 mg Intramuscular TID PRN McLauchlin, Angela, NP       And  LORazepam  (ATIVAN ) injection 2 mg  2 mg Intramuscular TID PRN McLauchlin, Angela, NP       dolutegravir  (TIVICAY ) tablet 50 mg  50 mg Oral Daily Nicholaus Brad RAMAN, NP       gabapentin  (NEURONTIN ) capsule 300 mg  300 mg Oral TID Nicholaus Brad RAMAN, NP   300 mg at 05/04/23 1613   haloperidol  (HALDOL ) tablet 2 mg  2 mg Oral BID Jacobo Moncrief S, NP   2 mg at 05/04/23 1613   hydrOXYzine  (ATARAX ) tablet 25 mg  25 mg Oral TID PRN McLauchlin, Angela, NP       magnesium  hydroxide (MILK OF MAGNESIA) suspension 30 mL  30 mL Oral Daily PRN McLauchlin, Angela, NP       melatonin tablet 5 mg  5 mg Oral QHS Nicholaus Brad RAMAN, NP       traZODone  (DESYREL ) tablet 50 mg  50 mg Oral QHS PRN McLauchlin, Angela, NP       PTA Medications: Medications Prior to Admission  Medication Sig Dispense Refill Last Dose/Taking   amoxicillin -clavulanate (AUGMENTIN ) 875-125 MG tablet Take 1 tablet by mouth 2 (two) times daily for 10 days. (Patient not taking: Reported on 05/03/2023) 20 tablet 0    benztropine  (COGENTIN ) 1 MG tablet Take 1 tablet (1 mg total) by mouth daily. 30 tablet 0    chlorhexidine (PERIDEX) 0.12 % solution SMARTSIG:By Mouth (Patient not taking: Reported on 07/25/2022)      dolutegravir  (TIVICAY ) 50 MG tablet Take 1 tablet (50 mg total) by mouth daily. (Patient not taking: Reported on 05/03/2023) 30 tablet 3    emtricitabine -tenofovir  AF (DESCOVY ) 200-25 MG tablet Take 1 tablet by mouth daily. (Patient not taking: Reported on 05/03/2023) 30 tablet 3    ethambutol  (MYAMBUTOL ) 400 MG tablet Take 3 tablets (1,200 mg total) by mouth daily. (Patient not taking: Reported on 05/03/2023) 90 tablet 3    gabapentin  (NEURONTIN ) 300 MG capsule Take 1 capsule (300 mg total) by mouth 3 (three) times daily. (Patient not taking: Reported on 07/25/2022) 90  capsule 0    haloperidol  (HALDOL ) 5 MG tablet Take 1 tablet (5 mg total) by mouth 2 (two) times daily. (Patient not taking: Reported on 07/25/2022) 60 tablet 0    naproxen  (NAPROSYN ) 250 MG tablet Take 1 tablet (250 mg total) by mouth 2 (two) times daily with a meal. (Patient not taking: Reported on 05/03/2023) 60 tablet 0    rifabutin  (MYCOBUTIN ) 150 MG capsule Take 2 capsules (300 mg total) by mouth daily. (Patient not taking: Reported on 05/03/2023) 60 capsule 3    traZODone  (DESYREL ) 150 MG tablet Take 1 tablet (150 mg total) by mouth at bedtime. 30 tablet 0     Musculoskeletal: Strength & Muscle Tone: within normal limits Gait & Station: normal Patient leans: N/A            Psychiatric Specialty Exam:  Presentation  General Appearance:  Disheveled  Eye Contact: Minimal  Speech: Pressured  Speech Volume: Normal  Handedness: Right   Mood and Affect  Mood: Irritable; Anxious  Affect: Flat   Thought Process  Thought Processes: Disorganized; Linear (Linear but occasionally tangential when redirected to substance use or reasons for IVC.)  Duration of Psychotic Symptoms: 6 months Past Diagnosis of Schizophrenia or Psychoactive disorder: yes Descriptions of Associations:Loose  Orientation:Partial  Thought Content:Scattered  Hallucinations:Hallucinations: Auditory Description of Auditory Hallucinations: occasiona mumbling  Ideas of Reference:Paranoia  Suicidal Thoughts:Suicidal Thoughts: No  Homicidal Thoughts: none  Sensorium  Memory: Immediate Fair;  Remote Fair  Judgment: Impaired (Minimizes substance use and its impact on psychiatric symptoms.)  Insight: Lacking (patient minimizes substance use and the severity of his situation. Does not acknowledge the role of his behavior in the IVC.)   Executive Functions  Concentration: Poor  Attention Span: Poor  Recall: [employer] of Knowledge: Fair  Language: Fair   Psychomotor  Activity  Psychomotor Activity: Psychomotor Activity: Normal   Assets  Assets: Housing; Communication Skills   Sleep  Sleep: Sleep: Fair Number of Hours of Sleep: 5    Physical Exam: Physical Exam Vitals and nursing note reviewed.  Constitutional:      Appearance: Normal appearance.  HENT:     Head: Normocephalic and atraumatic.     Nose: Nose normal.  Pulmonary:     Effort: Pulmonary effort is normal.  Musculoskeletal:        General: Normal range of motion.     Cervical back: Normal range of motion.  Neurological:     General: No focal deficit present.     Mental Status: He is alert. Mental status is at baseline.  Psychiatric:        Attention and Perception: He is inattentive. He perceives auditory hallucinations.        Mood and Affect: Mood is anxious. Affect is blunt and flat.        Speech: Speech is rapid and pressured.        Behavior: Behavior is agitated and withdrawn. Behavior is cooperative.        Thought Content: Thought content is paranoid.        Cognition and Memory: Cognition is impaired. He exhibits impaired remote memory.        Judgment: Judgment is impulsive.    ROS Blood pressure 123/83, pulse 97, temperature 98.1 F (36.7 C), height 6' 3 (1.905 m), weight 97.5 kg, SpO2 98%. Body mass index is 26.87 kg/m.  Treatment Plan Summary: Daily contact with patient to assess and evaluate symptoms and progress in treatment and Medication management Amoxicillin -Clavulanate (Augmentin ) 875-125 mg x6 days for oral infection Benztropine  (Cogentin ) 1 mg  Management of extrapyramidal symptoms (EPS) often related to antipsychotic use. Dolutegravir  (Tivicay ) 50 mg Antiretroviral therapy for HIV Emtricitabine -Tenofovir  AF (Descovy ) 200-25 mg Antiretroviral therapy for HIV or pre-exposure prophylaxis (PrEP). Gabapentin  (Neurontin ) 300 mg mood stabilization. Haloperidol  (Haldol ) 5 mg for schizophrenia Trazodone  (Desyrel ) 150 mg  for insomnia  Lipids Panel   Assess for dyslipidemia, especially if the patient is on antiretroviral therapy (ART), which can affect lipid levels. RPR (Rapid Plasma Reagin confirm diagnosis with specific treponemal tests :CD4 Count  Assess immune function in patients with HIV. Urine Drug Screen (UDS) Identify recent substance use (e.g., methamphetamine, cannabis, other substances) that may contribute to psychiatric symptoms or non-compliance. Continue regular monitoring for psychosis, mood instability, anxiety, pain, and insomnia. Evaluate for any new or worsening symptoms. Observation Level/Precautions:  Continuous Observation 15 minute checks Seizure  Laboratory:  GGT UDS  Psychotherapy:    Medications:    Consultations:    Discharge Concerns:    Estimated LOS:  Other:     Physician Treatment Plan for Primary Diagnosis: Schizophrenia, undifferentiated (HCC) Long Term Goal(s): Improvement in symptoms so as ready for discharge  Short Term Goals: Ability to identify changes in lifestyle to reduce recurrence of condition will improve, Ability to verbalize feelings will improve, Ability to disclose and discuss suicidal ideas, Ability to demonstrate self-control will improve, Ability to identify and develop effective coping behaviors will improve,  Ability to maintain clinical measurements within normal limits will improve, Compliance with prescribed medications will improve, and Ability to identify triggers associated with substance abuse/mental health issues will improve  Physician Treatment Plan for Secondary Diagnosis: Principal Problem:   Schizophrenia, undifferentiated (HCC) Active Problems:   Methamphetamine abuse (HCC)  Long Term Goal(s): Improvement in symptoms so as ready for discharge  Short Term Goals: Ability to identify changes in lifestyle to reduce recurrence of condition will improve, Ability to verbalize feelings will improve, Ability to disclose and discuss suicidal ideas, Ability to demonstrate  self-control will improve, Ability to identify and develop effective coping behaviors will improve, Ability to maintain clinical measurements within normal limits will improve, Compliance with prescribed medications will improve, and Ability to identify triggers associated with substance abuse/mental health issues will improve  I certify that inpatient services furnished can reasonably be expected to improve the patient's condition.    Brad GORMAN Moats, NP 1/10/20256:35 PM

## 2023-05-04 NOTE — BHH Suicide Risk Assessment (Signed)
 [facility] Admission Suicide Risk Assessment   Nursing information obtained from:    Demographic factors:  Male Current Mental Status:  NA Loss Factors:  NA Historical Factors:  NA Risk Reduction Factors:  Positive coping skills or problem solving skills  Total Time spent with patient: 2 hours Principal Problem: Schizophrenia, undifferentiated (HCC) Diagnosis:  Principal Problem:   Schizophrenia, undifferentiated (HCC) Active Problems:   Methamphetamine abuse (HCC)  Subjective Data: 38 year old African American male who presented to the ER under involuntary commitment (IVC) initiated by his mother.  The IVC reports a history of schizophrenia and bipolar disorder and describes threats to harm himself and his mother on 05/02/2023, alongside extreme aggression and medication non-compliance. Allegations of methamphetamine use to self-medicate are also noted.The patient states the IVC followed an argument with his mother, during which law enforcement was called. He denies the reported threats of harm and instead claims he intended for his mother to be brought to the ER. The patient minimizes his substance use, reporting last use of methamphetamine approximately a week ago and admitting occasional cannabis use. He avoids discussing the extent of his substance use further, redirecting the conversation to his frustration with the circumstances of his admission.During the ER stay, the patient required medication to manage agitation but currently denies suicidal ideation (SI), homicidal ideation (HI), and auditory or visual hallucinations (AVH). Despite this, he presents with disorganized thoughts, paranoia, and limited insight into his condition. He acknowledges frustration with hospital staff and feelings of being misunderstood, attributing others' negative perceptions of him to his demeanor. He also reports ongoing stress related to housing instability.The patient is unable to recall the medications he was  prescribed previously. He reports a history of non-compliance with prescribed medications and self-medicating with substances.Schizophrenia and Bipolar Disorder: Documented diagnosis with recent decompensation. Methamphetamine (most recent use ~1 week ago), cannabis, and possible other substances.Prior threats to harm self and others. Unstable housing, poor medication adherence, and unresolved conflict with his mother. Continued Clinical Symptoms:  Alcohol  Use Disorder Identification Test Final Score (AUDIT): 3 The Alcohol  Use Disorders Identification Test, Guidelines for Use in Primary Care, Second Edition.  World [employer] Writer [facility]). Score between 0-7:  no or low risk or alcohol  related problems. Score between 8-15:  moderate risk of alcohol  related problems. Score between 16-19:  high risk of alcohol  related problems. Score 20 or above:  warrants further diagnostic evaluation for alcohol  dependence and treatment.   CLINICAL FACTORS:   Alcohol /Substance Abuse/Dependencies Schizophrenia:   Less than 57 years old Paranoid or undifferentiated type More than one psychiatric diagnosis Medical Diagnoses and Treatments/Surgeries   Musculoskeletal: Strength & Muscle Tone: within normal limits Gait & Station: normal Patient leans: N/A  Psychiatric Specialty Exam:  Presentation  General Appearance:  Disheveled  Eye Contact: Minimal  Speech: Pressured  Speech Volume: Normal  Handedness: Right   Mood and Affect  Mood: Irritable; Anxious  Affect: Flat   Thought Process  Thought Processes: Disorganized; Linear (Linear but occasionally tangential when redirected to substance use or reasons for IVC.)  Descriptions of Associations:Loose  Orientation:Partial  Thought Content:Scattered  History of Schizophrenia/Schizoaffective disorder: yes Duration of Psychotic Symptoms: 4 months Hallucinations:Hallucinations: Auditory Description of Auditory Hallucinations:  occasiona mumbling  Ideas of Reference:Paranoia  Suicidal Thoughts:Suicidal Thoughts: No  Homicidal Thoughts:No data recorded  Sensorium  Memory: Immediate Fair; Remote Fair  Judgment: Impaired (Minimizes substance use and its impact on psychiatric symptoms.)  Insight: Lacking (patient minimizes substance use and the severity of his situation. Does not acknowledge  the role of his behavior in the IVC.)   Executive Functions  Concentration: Poor  Attention Span: Poor  Recall: [employer] of Knowledge: Fair  Language: Fair   Psychomotor Activity  Psychomotor Activity:Psychomotor Activity: Normal   Assets  Assets: Housing; Communication Skills   Sleep  Sleep:Sleep: Fair Number of Hours of Sleep: 5    Physical Exam: Physical Exam Vitals and nursing note reviewed.  Constitutional:      Appearance: Normal appearance.  HENT:     Head: Normocephalic and atraumatic.     Nose: Nose normal.  Pulmonary:     Effort: Pulmonary effort is normal.  Musculoskeletal:        General: Normal range of motion.     Cervical back: Normal range of motion.  Neurological:     General: No focal deficit present.     Mental Status: He is alert. Mental status is at baseline.  Psychiatric:        Attention and Perception: Perception normal. He is inattentive.        Mood and Affect: Mood is anxious. Affect is blunt and flat.        Speech: Speech is rapid and pressured.        Behavior: Behavior is agitated. Behavior is cooperative.        Thought Content: Thought content is paranoid.        Cognition and Memory: Cognition is impaired. He exhibits impaired recent memory.        Judgment: Judgment is impulsive.    Review of Systems  Psychiatric/Behavioral:  Positive for substance abuse. The patient is nervous/anxious.   All other systems reviewed and are negative.  Blood pressure 123/83, pulse 97, temperature 98.1 F (36.7 C), height 6' 3 (1.905 m), weight 97.5 kg,  SpO2 98%. Body mass index is 26.87 kg/m.   COGNITIVE FEATURES THAT CONTRIBUTE TO RISK:  None    SUICIDE RISK:   Minimal: No identifiable suicidal ideation.  Patients presenting with no risk factors but with morbid ruminations; may be classified as minimal risk based on the severity of the depressive symptoms  PLAN OF CARE:  Amoxicillin -Clavulanate (Augmentin ) 875-125 mg x6 days for oral infection Benztropine  (Cogentin ) 1 mg  Management of extrapyramidal symptoms (EPS) often related to antipsychotic use. Dolutegravir  (Tivicay ) 50 mg Antiretroviral therapy for HIV Emtricitabine -Tenofovir  AF (Descovy ) 200-25 mg Antiretroviral therapy for HIV or pre-exposure prophylaxis (PrEP). Gabapentin  (Neurontin ) 300 mg mood stabilization. Haloperidol  (Haldol ) 5 mg for schizophrenia Trazodone  (Desyrel ) 150 mg  for insomnia  Lipids Panel  Assess for dyslipidemia, especially if the patient is on antiretroviral therapy (ART), which can affect lipid levels. RPR (Rapid Plasma Reagin confirm diagnosis with specific treponemal tests :CD4 Count  Assess immune function in patients with HIV. Urine Drug Screen (UDS) Identify recent substance use (e.g., methamphetamine, cannabis, other substances) that may contribute to psychiatric symptoms or non-compliance. Continue regular monitoring for psychosis, mood instability, anxiety, pain, and insomnia. Evaluate for any new or worsening symptoms. I certify that inpatient services furnished can reasonably be expected to improve the patient's condition.   Brad GORMAN Moats, NP 05/04/2023, 6:19 PM

## 2023-05-05 DIAGNOSIS — F203 Undifferentiated schizophrenia: Secondary | ICD-10-CM | POA: Diagnosis not present

## 2023-05-05 LAB — CULTURE, BLOOD (ROUTINE X 2)
Culture: NO GROWTH
Culture: NO GROWTH

## 2023-05-05 LAB — LIPID PANEL
Cholesterol: 139 mg/dL (ref 0–200)
HDL: 40 mg/dL — ABNORMAL LOW
LDL Cholesterol: 77 mg/dL (ref 0–99)
Total CHOL/HDL Ratio: 3.5 ratio
Triglycerides: 108 mg/dL
VLDL: 22 mg/dL (ref 0–40)

## 2023-05-05 LAB — HEMOGLOBIN A1C
Hgb A1c MFr Bld: 6.4 % — ABNORMAL HIGH (ref 4.8–5.6)
Mean Plasma Glucose: 136.98 mg/dL

## 2023-05-05 MED ORDER — DIPHENHYDRAMINE HCL 25 MG PO CAPS
25.0000 mg | ORAL_CAPSULE | Freq: Once | ORAL | Status: AC
  Administered 2023-05-05: 25 mg via ORAL
  Filled 2023-05-05: qty 1

## 2023-05-05 MED ORDER — HALOPERIDOL LACTATE 5 MG/ML IJ SOLN
2.0000 mg | Freq: Once | INTRAMUSCULAR | Status: DC
Start: 2023-05-05 — End: 2023-05-07

## 2023-05-05 MED ORDER — LORAZEPAM 2 MG/ML IJ SOLN
1.0000 mg | Freq: Once | INTRAMUSCULAR | Status: AC
  Administered 2023-05-05: 1 mg via INTRAMUSCULAR

## 2023-05-05 MED ORDER — EMTRICITABINE-TENOFOVIR AF 200-25 MG PO TABS
1.0000 | ORAL_TABLET | Freq: Every day | ORAL | Status: DC
  Administered 2023-05-05 – 2023-05-08 (×4): 1 via ORAL
  Filled 2023-05-05 (×4): qty 1

## 2023-05-05 MED ORDER — DIPHENHYDRAMINE HCL 50 MG/ML IJ SOLN: 25.0000 mg | Freq: Once | INTRAMUSCULAR | Status: AC

## 2023-05-05 MED ORDER — HALOPERIDOL LACTATE 5 MG/ML IJ SOLN
2.0000 mg | Freq: Once | INTRAMUSCULAR | Status: AC
  Administered 2023-05-05: 2 mg via INTRAMUSCULAR

## 2023-05-05 NOTE — Group Note (Signed)
 Date:  05/05/2023 Time:  3:25 AM  Group Topic/Focus:  Goals Group:   The focus of this group is to help patients establish daily goals to achieve during treatment and discuss how the patient can incorporate goal setting into their daily lives to aide in recovery.    Participation Level:  Did Not Attend  Michael Chandler 05/05/2023, 3:25 AM

## 2023-05-05 NOTE — Plan of Care (Signed)
  Problem: Education: Goal: Emotional status will improve Outcome: Progressing   Problem: Education: Goal: Knowledge of [facility] Education information/materials will improve Outcome: Progressing

## 2023-05-05 NOTE — Progress Notes (Signed)
   05/04/23 2000  Psych Admission Type (Psych Patients Only)  Admission Status Involuntary  Psychosocial Assessment  Patient Complaints None  Eye Contact Brief  Facial Expression Flat  Affect Sad  Speech Soft  Interaction Forwards little  Motor Activity Slow  Appearance/Hygiene Body odor;Disheveled  Behavior Characteristics Cooperative  Mood Depressed  Aggressive Behavior  Effect No apparent injury  Thought Process  Coherency WDL  Content Blaming others  Delusions None reported or observed  Perception WDL  Hallucination None reported or observed  Judgment Impaired  Confusion WDL  Danger to Self  Current suicidal ideation? Denies  Danger to Others  Danger to Others None reported or observed   Patient alert and oriented x 4, thoughts are organized and coherent, affect is congruent with mood. 15 minutes safety checks maintained.

## 2023-05-05 NOTE — Progress Notes (Signed)
 Throughout the day pt was agitated, defiant and verbally aggressive at times creating the need for IM medication which was taken willingly.  Pt with disorganized thought, suspicious of some medications but did ultimately take medications as ordered.  Pt did not become physically aggressive at any time.  After IM medication pt became more compliant and calm although still irritable at times.  Pt was continuously monitored for safety throughout the shift.  No SI or HI or AVH reported.    05/05/23 1600  Psych Admission Type (Psych Patients Only)  Admission Status Involuntary  Psychosocial Assessment  Patient Complaints None  Eye Contact Brief  Facial Expression Anxious  Affect Anxious  Speech Loud  Interaction Isolative  Motor Activity Slow  Appearance/Hygiene Disheveled  Behavior Characteristics Irritable  Mood Depressed  Thought Process  Coherency Disorganized  Content Blaming others  Delusions Paranoid  Perception WDL  Hallucination None reported or observed  Judgment Impaired  Confusion WDL  Danger to Self  Current suicidal ideation? Denies  Danger to Others  Danger to Others Reported or observed  Danger to Others Abnormal  Harmful Behavior to others No threats or harm toward other people  Destructive Behavior No threats or harm toward property

## 2023-05-05 NOTE — Plan of Care (Signed)
  Problem: Safety: Goal: Periods of time without injury will increase Outcome: Progressing   Problem: Health Behavior/Discharge Planning: Goal: Compliance with treatment plan for underlying cause of condition will improve Outcome: Progressing

## 2023-05-05 NOTE — Progress Notes (Signed)
 [facility] MD Progress Note  05/05/2023 11:49 AM Michael Chandler  MRN:  [id number] Subjective:  38 year old African American male with a history of schizophrenia and known nonadherence to prescribed medications.states, I need to be in the hospital. No one is drawing my blood. I can't walk or stand.Patient demonstrates increasing agitation and appears confused about being in the hospital, despite redirection.ports inability to walk or stand, but ambulated with a steady gait. Engaged in disruptive behavior, such as entering other patients' rooms, despite attempts to redirect. Principal Problem: Schizophrenia, undifferentiated (HCC) Diagnosis: Principal Problem:   Schizophrenia, undifferentiated (HCC) Active Problems:   Methamphetamine abuse (HCC)  Total Time spent with patient: 2 hours  Past Psychiatric History: see below  Past Medical History:  Past Medical History:  Diagnosis Date   HIV (human immunodeficiency virus infection) (HCC)    Schizophrenia (HCC)    per IVC paperwork, pt states does not have this dx    Past Surgical History:  Procedure Laterality Date   IR US  GUIDE BX ASP/DRAIN  03/21/2022   Family History: History reviewed. No pertinent family history. Family Psychiatric  History: none reported Social History:  Social History   Substance and Sexual Activity  Alcohol  Use Yes   Comment: occasional     Social History   Substance and Sexual Activity  Drug Use Yes   Types: Methamphetamines, Marijuana, Cocaine   Comment: states occasional marijuana and meth use    Social History   Socioeconomic History   Marital status: Single    Spouse name: Not on file   Number of children: Not on file   Years of education: Not on file   Highest education level: Not on file  Occupational History   Not on file  Tobacco Use   Smoking status: Former    Types: Cigarettes   Smokeless tobacco: Never   Tobacco comments:    Patient denies using tobacco products (03/13/22)  Substance and  Sexual Activity   Alcohol  use: Yes    Comment: occasional   Drug use: Yes    Types: Methamphetamines, Marijuana, Cocaine    Comment: states occasional marijuana and meth use   Sexual activity: Not Currently    Comment: condoms accepted  Other Topics Concern   Not on file  Social History Narrative   Not on file   Social Drivers of Health   Financial Resource Strain: Low Risk  (12/29/2022)   Received from [employer]    Financial Concerns: none of these  Food Insecurity: No Food Insecurity (05/04/2023)   Hunger Vital Sign    Worried About Running Out of Food in the Last Year: Never true    Ran Out of Food in the Last Year: Never true  Transportation Needs: No Transportation Needs (05/04/2023)   PRAPARE - Administrator, Civil Service (Medical): No    Lack of Transportation (Non-Medical): No  Physical Activity: Not on file  Stress: Not on file  Social Connections: High Risk (02/14/2022)   [facility] from [facility]   Social Connections    How often do you see or talk to people that you care about and feel close to? (For example: talking to friends on the phone, visiting friends or family, going to church or club meetings): Less than once a week   Additional Social History:  Sleep: Good  Appetite:  Good  Current Medications: Current Facility-Administered Medications  Medication Dose Route Frequency Provider Last Rate Last Admin   acetaminophen  (TYLENOL ) tablet 650 mg  650 mg Oral Q6H PRN McLauchlin, Angela, NP       alum & mag hydroxide-simeth (MAALOX/MYLANTA) 200-200-20 MG/5ML suspension 30 mL  30 mL Oral Q4H PRN McLauchlin, Angela, NP       amoxicillin -clavulanate (AUGMENTIN ) 875-125 MG per tablet 1 tablet  1 tablet Oral Q12H Nicholaus Brad RAMAN, NP   1 tablet at 05/05/23 9049   benztropine  (COGENTIN ) tablet 1 mg  1 mg Oral Daily Pearlean Sabina S, NP   1 mg at  05/05/23 9049   diphenhydrAMINE  (BENADRYL ) capsule 25 mg  25 mg Oral Once Margerite Impastato S, NP       Or   diphenhydrAMINE  (BENADRYL ) injection 25 mg  25 mg Intramuscular Once Kage Willmann S, NP       haloperidol  (HALDOL ) tablet 5 mg  5 mg Oral TID PRN McLauchlin, Angela, NP       And   diphenhydrAMINE  (BENADRYL ) capsule 50 mg  50 mg Oral TID PRN McLauchlin, Angela, NP   50 mg at 05/04/23 1701   haloperidol  lactate (HALDOL ) injection 5 mg  5 mg Intramuscular TID PRN McLauchlin, Angela, NP       And   diphenhydrAMINE  (BENADRYL ) injection 50 mg  50 mg Intramuscular TID PRN McLauchlin, Jon, NP       And   LORazepam  (ATIVAN ) injection 2 mg  2 mg Intramuscular TID PRN McLauchlin, Angela, NP       haloperidol  lactate (HALDOL ) injection 10 mg  10 mg Intramuscular TID PRN McLauchlin, Angela, NP       And   diphenhydrAMINE  (BENADRYL ) injection 50 mg  50 mg Intramuscular TID PRN McLauchlin, Angela, NP       And   LORazepam  (ATIVAN ) injection 2 mg  2 mg Intramuscular TID PRN McLauchlin, Angela, NP       dolutegravir  (TIVICAY ) tablet 50 mg  50 mg Oral Daily Dontrez Pettis S, NP   50 mg at 05/05/23 9049   emtricitabine -tenofovir  AF (DESCOVY ) 200-25 MG per tablet 1 tablet  1 tablet Oral Daily Nicholaus Brad RAMAN, NP       gabapentin  (NEURONTIN ) capsule 300 mg  300 mg Oral TID Nicholaus Brad RAMAN, NP   300 mg at 05/05/23 9049   haloperidol  (HALDOL ) tablet 2 mg  2 mg Oral BID Ocie Stanzione S, NP   2 mg at 05/05/23 9049   haloperidol  lactate (HALDOL ) injection 2 mg  2 mg Intramuscular Once Peja Allender S, NP       haloperidol  lactate (HALDOL ) injection 2 mg  2 mg Intramuscular Once Vignesh Willert S, NP       hydrOXYzine  (ATARAX ) tablet 25 mg  25 mg Oral TID PRN McLauchlin, Angela, NP       LORazepam  (ATIVAN ) injection 1 mg  1 mg Intramuscular Once Lizzy Hamre S, NP       magnesium  hydroxide (MILK OF MAGNESIA) suspension 30 mL  30 mL Oral Daily PRN McLauchlin, Angela, NP       melatonin tablet 5 mg  5 mg Oral QHS Loredana Medellin S, NP        traZODone  (DESYREL ) tablet 50 mg  50 mg Oral QHS PRN McLauchlin, Angela, NP        Lab Results:  Results for orders placed or performed during the hospital encounter of 05/04/23 (from the past 48  hours)  Urine Drug Screen, Qualitative ([facility] only)     Status: Abnormal   Collection Time: 05/04/23  5:00 PM  Result Value Ref Range   Tricyclic, Ur Screen NONE DETECTED NONE DETECTED   Amphetamines, Ur Screen POSITIVE (A) NONE DETECTED   MDMA (Ecstasy)Ur Screen NONE DETECTED NONE DETECTED   Cocaine Metabolite,Ur [place] NONE DETECTED NONE DETECTED   Opiate, Ur Screen NONE DETECTED NONE DETECTED   Phencyclidine (PCP) Ur S NONE DETECTED NONE DETECTED   Cannabinoid 50 Ng, Ur [place] NONE DETECTED NONE DETECTED   Barbiturates, Ur Screen NONE DETECTED NONE DETECTED   Benzodiazepine, Ur Scrn NONE DETECTED NONE DETECTED   Methadone Scn, Ur NONE DETECTED NONE DETECTED    Comment: (NOTE) Tricyclics + metabolites, urine    Cutoff 1000 ng/mL Amphetamines + metabolites, urine  Cutoff 1000 ng/mL MDMA (Ecstasy), urine              Cutoff 500 ng/mL Cocaine Metabolite, urine          Cutoff 300 ng/mL Opiate + metabolites, urine        Cutoff 300 ng/mL Phencyclidine (PCP), urine         Cutoff 25 ng/mL Cannabinoid, urine                 Cutoff 50 ng/mL Barbiturates + metabolites, urine  Cutoff 200 ng/mL Benzodiazepine, urine              Cutoff 200 ng/mL Methadone, urine                   Cutoff 300 ng/mL  The urine drug screen provides only a preliminary, unconfirmed analytical test result and should not be used for non-medical purposes. Clinical consideration and professional judgment should be applied to any positive drug screen result due to possible interfering substances. A more specific alternate chemical method must be used in order to obtain a confirmed analytical result. Gas chromatography / mass spectrometry (GC/MS) is the preferred confirm atory method. Performed at [facility], [address] [place], [place] [address]     Blood Alcohol  level:  Lab Results  Component Value Date   [facility] <10 05/02/2023   ETH <10 07/03/2022    Metabolic Disorder Labs: Lab Results  Component Value Date   HGBA1C 5.7 (H) 03/04/2022   MPG 116.89 03/04/2022   MPG 131.24 12/18/2021   No results found for: PROLACTIN Lab Results  Component Value Date   CHOL 164 03/13/2022   TRIG 172 (H) 03/13/2022   HDL 45 03/13/2022   CHOLHDL 3.6 03/13/2022   VLDL 50 (H) 03/04/2022   LDLCALC 92 03/13/2022   LDLCALC 89 03/04/2022    Physical Findings: AIMS:  , ,  ,  ,    CIWA:    COWS:     Musculoskeletal: Strength & Muscle Tone: within normal limits Gait & Station: normal Patient leans: N/A  Psychiatric Specialty Exam:  Presentation  General Appearance:  Fairly Groomed; Disheveled  Eye Contact: Minimal  Speech: Pressured; Blocked  Speech Volume: Increased  Handedness: Right   Mood and Affect  Mood: Anxious; Irritable (Agitated)  Affect: Inappropriate; Constricted   Thought Process  Thought Processes: Disorganized (Increasingly disruptive: Entering other patient rooms and calling 911 from the nursing station.)  Descriptions of Associations:Tangential  Orientation:Partial (Paranoid ideation, focused on perceived lack of care and need for hospitalization.)  Thought Content:Scattered (Difficulty maintaining focus during interactions.)  History of Schizophrenia/Schizoaffective disorder: yes Duration of Psychotic Symptoms: 6 months  Hallucinations:Hallucinations: None Description of Auditory Hallucinations: denies  Ideas of Reference:Paranoia  Suicidal Thoughts:Suicidal Thoughts: No  Homicidal Thoughts:Homicidal Thoughts: No   Sensorium  Memory: Immediate Poor; Remote Poor  Judgment: Impaired (Inappropriate use of emergency services (calling 911 while already in the hospital).)  Insight: Poor (: Limited understanding of his current  hospitalization and the role of staff interventions.)   Executive Functions  Concentration: Poor  Attention Span: Poor  Recall: Poor  Fund of Knowledge: Poor  Language: Fair   Psychomotor Activity  Psychomotor Activity: Psychomotor Activity: Normal (Ambulates independently with a steady gait despite verbal complaints of being unable to walk or stand.)   Assets  Assets: Social Support   Sleep  Sleep: Sleep: Good Number of Hours of Sleep: 6    Physical Exam: Physical Exam Vitals and nursing note reviewed.  Constitutional:      Appearance: Normal appearance.  HENT:     Head: Normocephalic and atraumatic.     Nose: Nose normal.  Pulmonary:     Effort: Pulmonary effort is normal.  Musculoskeletal:        General: Normal range of motion.     Cervical back: Normal range of motion.  Neurological:     General: No focal deficit present.     Mental Status: He is alert. Mental status is at baseline. He is disoriented.  Psychiatric:        Attention and Perception: He is inattentive.        Mood and Affect: Mood is anxious. Affect is labile, blunt, tearful and inappropriate.        Speech: Speech is rapid and pressured.        Behavior: Behavior is agitated. Behavior is cooperative.        Thought Content: Thought content is paranoid.        Cognition and Memory: Cognition is impaired. He exhibits impaired recent memory.        Judgment: Judgment is impulsive.    Review of Systems  Psychiatric/Behavioral:  Positive for substance abuse. The patient is nervous/anxious.   All other systems reviewed and are negative.  Blood pressure 123/83, pulse 97, temperature 98.1 F (36.7 C), height 6' 3 (1.905 m), weight 97.5 kg, SpO2 98%. Body mass index is 26.87 kg/m.   Treatment Plan Summary: Daily contact with patient to assess and evaluate symptoms and progress in treatment and Medication management Administered medications to manage acute agitation and  paranoia: Haloperidol  (Haldol ) 2 mg IM Lorazepam  (Ativan ) 1 mg IM Diphenhydramine  (Benadryl ) 25 mg IM Move the patient to a low-stimulation environment to reduce external triggers. Redirect patient when entering other rooms or engaging in disruptive behaviors. Set clear and consistent boundaries to ensure patient and staff safety. Brad GORMAN Moats, NP 05/05/2023, 11:49 AM

## 2023-05-06 DIAGNOSIS — F203 Undifferentiated schizophrenia: Secondary | ICD-10-CM | POA: Diagnosis not present

## 2023-05-06 LAB — RPR
RPR Ser Ql: REACTIVE — AB
RPR Titer: 1:2 {titer}

## 2023-05-06 MED ORDER — HALOPERIDOL LACTATE 5 MG/ML IJ SOLN
2.0000 mg | Freq: Once | INTRAMUSCULAR | Status: AC
  Administered 2023-05-06: 2 mg via INTRAMUSCULAR
  Filled 2023-05-06: qty 1

## 2023-05-06 MED ORDER — LORAZEPAM 2 MG/ML IJ SOLN
1.0000 mg | Freq: Once | INTRAMUSCULAR | Status: AC
  Administered 2023-05-06: 1 mg via INTRAMUSCULAR
  Filled 2023-05-06: qty 1

## 2023-05-06 MED ORDER — DIPHENHYDRAMINE HCL 50 MG/ML IJ SOLN
25.0000 mg | Freq: Once | INTRAMUSCULAR | Status: AC
  Administered 2023-05-06: 25 mg via INTRAMUSCULAR
  Filled 2023-05-06: qty 1

## 2023-05-06 NOTE — Progress Notes (Signed)
 [facility] MD Progress Note  05/06/2023 4:26 PM Marcia Lepera  MRN:  [id number] Subjective:  38-Year-Old African American Male  states, I am not doing well and requested morning medications to be delivered to his room. He later came to the nurse's station, stating, Call my mom to pick me up. The patient denies suicidal ideation (SI), homicidal ideation (HI), and auditory/visual hallucinations (AVH).The patient presents with acute anxiety and agitation, resulting in pacing and verbal aggression. He was receptive to redirection and IM medication, which stabilized his behavior. Denial of SI, HI, and AVH suggests the distress is situational rather than psychotic. RPR reactive result noted, requiring further evaluation and management. Principal Problem: Schizophrenia, undifferentiated (HCC) Diagnosis: Principal Problem:   Schizophrenia, undifferentiated (HCC) Active Problems:   Methamphetamine abuse (HCC)  Total Time spent with patient: 1.5 hours  Past Psychiatric History: see below  Past Medical History:  Past Medical History:  Diagnosis Date   HIV (human immunodeficiency virus infection) (HCC)    Schizophrenia (HCC)    per IVC paperwork, pt states does not have this dx    Past Surgical History:  Procedure Laterality Date   IR US  GUIDE BX ASP/DRAIN  03/21/2022   Family History: History reviewed. No pertinent family history. Family Psychiatric  History: none reported Social History:  Social History   Substance and Sexual Activity  Alcohol  Use Yes   Comment: occasional     Social History   Substance and Sexual Activity  Drug Use Yes   Types: Methamphetamines, Marijuana, Cocaine   Comment: states occasional marijuana and meth use    Social History   Socioeconomic History   Marital status: Single    Spouse name: Not on file   Number of children: Not on file   Years of education: Not on file   Highest education level: Not on file  Occupational History   Not on file  Tobacco Use    Smoking status: Former    Types: Cigarettes   Smokeless tobacco: Never   Tobacco comments:    Patient denies using tobacco products (03/13/22)  Substance and Sexual Activity   Alcohol  use: Yes    Comment: occasional   Drug use: Yes    Types: Methamphetamines, Marijuana, Cocaine    Comment: states occasional marijuana and meth use   Sexual activity: Not Currently    Comment: condoms accepted  Other Topics Concern   Not on file  Social History Narrative   Not on file   Social Drivers of Health   Financial Resource Strain: Low Risk  (12/29/2022)   Received from [employer]    Financial Concerns: none of these  Food Insecurity: No Food Insecurity (05/04/2023)   Hunger Vital Sign    Worried About Running Out of Food in the Last Year: Never true    Ran Out of Food in the Last Year: Never true  Transportation Needs: No Transportation Needs (05/04/2023)   PRAPARE - Administrator, Civil Service (Medical): No    Lack of Transportation (Non-Medical): No  Physical Activity: Not on file  Stress: Not on file  Social Connections: High Risk (02/14/2022)   Received from [facility]   Social Connections    How often do you see or talk to people that you care about and feel close to? (For example: talking to friends on the phone, visiting friends or family, going to church or club meetings): Less than once a week  Additional Social History:                         Sleep: Good  Appetite:  Good  Current Medications: Current Facility-Administered Medications  Medication Dose Route Frequency Provider Last Rate Last Admin   acetaminophen  (TYLENOL ) tablet 650 mg  650 mg Oral Q6H PRN McLauchlin, Angela, NP       alum & mag hydroxide-simeth (MAALOX/MYLANTA) 200-200-20 MG/5ML suspension 30 mL  30 mL Oral Q4H PRN McLauchlin, Angela, NP       amoxicillin -clavulanate (AUGMENTIN ) 875-125 MG  per tablet 1 tablet  1 tablet Oral Q12H Nicholaus Brad RAMAN, NP   1 tablet at 05/06/23 9157   benztropine  (COGENTIN ) tablet 1 mg  1 mg Oral Daily Emery Binz S, NP   1 mg at 05/06/23 9156   haloperidol  (HALDOL ) tablet 5 mg  5 mg Oral TID PRN McLauchlin, Angela, NP       And   diphenhydrAMINE  (BENADRYL ) capsule 50 mg  50 mg Oral TID PRN McLauchlin, Angela, NP   50 mg at 05/04/23 1701   haloperidol  lactate (HALDOL ) injection 5 mg  5 mg Intramuscular TID PRN McLauchlin, Angela, NP       And   diphenhydrAMINE  (BENADRYL ) injection 50 mg  50 mg Intramuscular TID PRN McLauchlin, Angela, NP       And   LORazepam  (ATIVAN ) injection 2 mg  2 mg Intramuscular TID PRN McLauchlin, Angela, NP       haloperidol  lactate (HALDOL ) injection 10 mg  10 mg Intramuscular TID PRN McLauchlin, Jon, NP       And   diphenhydrAMINE  (BENADRYL ) injection 50 mg  50 mg Intramuscular TID PRN McLauchlin, Angela, NP       And   LORazepam  (ATIVAN ) injection 2 mg  2 mg Intramuscular TID PRN McLauchlin, Angela, NP       dolutegravir  (TIVICAY ) tablet 50 mg  50 mg Oral Daily Nicholaus Brad RAMAN, NP   50 mg at 05/06/23 9156   emtricitabine -tenofovir  AF (DESCOVY ) 200-25 MG per tablet 1 tablet  1 tablet Oral Daily Nicholaus Brad RAMAN, NP   1 tablet at 05/06/23 9157   gabapentin  (NEURONTIN ) capsule 300 mg  300 mg Oral TID Nicholaus Brad RAMAN, NP   300 mg at 05/06/23 1121   haloperidol  (HALDOL ) tablet 2 mg  2 mg Oral BID Nicholaus Brad RAMAN, NP   2 mg at 05/06/23 9157   haloperidol  lactate (HALDOL ) injection 2 mg  2 mg Intramuscular Once Zaiyden Strozier S, NP       hydrOXYzine  (ATARAX ) tablet 25 mg  25 mg Oral TID PRN McLauchlin, Angela, NP       magnesium  hydroxide (MILK OF MAGNESIA) suspension 30 mL  30 mL Oral Daily PRN McLauchlin, Angela, NP       melatonin tablet 5 mg  5 mg Oral QHS Nicholaus Brad RAMAN, NP       traZODone  (DESYREL ) tablet 50 mg  50 mg Oral QHS PRN McLauchlin, Angela, NP        Lab Results:  Results for orders placed or performed during the hospital  encounter of 05/04/23 (from the past 48 hours)  Urine Drug Screen, Qualitative ([facility] only)     Status: Abnormal   Collection Time: 05/04/23  5:00 PM  Result Value Ref Range   Tricyclic, Ur Screen NONE DETECTED NONE DETECTED   Amphetamines, Ur Screen POSITIVE (A) NONE DETECTED   MDMA (Ecstasy)Ur Screen NONE DETECTED NONE DETECTED  Cocaine Metabolite,Ur [place] NONE DETECTED NONE DETECTED   Opiate, Ur Screen NONE DETECTED NONE DETECTED   Phencyclidine (PCP) Ur S NONE DETECTED NONE DETECTED   Cannabinoid 50 Ng, Ur [place] NONE DETECTED NONE DETECTED   Barbiturates, Ur Screen NONE DETECTED NONE DETECTED   Benzodiazepine, Ur Scrn NONE DETECTED NONE DETECTED   Methadone Scn, Ur NONE DETECTED NONE DETECTED    Comment: (NOTE) Tricyclics + metabolites, urine    Cutoff 1000 ng/mL Amphetamines + metabolites, urine  Cutoff 1000 ng/mL MDMA (Ecstasy), urine              Cutoff 500 ng/mL Cocaine Metabolite, urine          Cutoff 300 ng/mL Opiate + metabolites, urine        Cutoff 300 ng/mL Phencyclidine (PCP), urine         Cutoff 25 ng/mL Cannabinoid, urine                 Cutoff 50 ng/mL Barbiturates + metabolites, urine  Cutoff 200 ng/mL Benzodiazepine, urine              Cutoff 200 ng/mL Methadone, urine                   Cutoff 300 ng/mL  The urine drug screen provides only a preliminary, unconfirmed analytical test result and should not be used for non-medical purposes. Clinical consideration and professional judgment should be applied to any positive drug screen result due to possible interfering substances. A more specific alternate chemical method must be used in order to obtain a confirmed analytical result. Gas chromatography / mass spectrometry (GC/MS) is the preferred confirm atory method. Performed at [facility], [address] [place], [place] [address]   Lipid panel     Status: Abnormal   Collection Time: 05/05/23 12:48 PM  Result Value Ref Range   Cholesterol 139 0 - 200  mg/dL   Triglycerides 891 <849 mg/dL   HDL 40 (L) >59 mg/dL   Total CHOL/HDL Ratio 3.5 RATIO   VLDL 22 0 - 40 mg/dL   LDL Cholesterol 77 0 - 99 mg/dL    Comment:        Total Cholesterol/HDL:CHD Risk Coronary Heart Disease Risk Table                     Men   Women  1/2 Average Risk   3.4   3.3  Average Risk       5.0   4.4  2 X Average Risk   9.6   7.1  3 X Average Risk  23.4   11.0        Use the calculated Patient Ratio above and the CHD Risk Table to determine the patient's CHD Risk.        ATP III CLASSIFICATION (LDL):  <100     mg/dL   Optimal  899-870  mg/dL   Near or Above                    Optimal  130-159  mg/dL   Borderline  839-810  mg/dL   High  >809     mg/dL   Very High Performed at [facility], [address] [place], [place] [address]   RPR     Status: Abnormal   Collection Time: 05/05/23 12:48 PM  Result Value Ref Range   RPR Ser Ql Reactive (A) NON REACTIVE    Comment:  SENT FOR CONFIRMATION   RPR Titer 1:2     Comment: Performed at [facility], [address], [place], [place] [address]    Blood Alcohol  level:  Lab Results  Component Value Date   [facility] <10 05/02/2023   ETH <10 07/03/2022    Metabolic Disorder Labs: Lab Results  Component Value Date   HGBA1C 6.4 (H) 05/02/2023   MPG 136.98 05/02/2023   MPG 116.89 03/04/2022   No results found for: PROLACTIN Lab Results  Component Value Date   CHOL 139 05/05/2023   TRIG 108 05/05/2023   HDL 40 (L) 05/05/2023   CHOLHDL 3.5 05/05/2023   VLDL 22 05/05/2023   LDLCALC 77 05/05/2023   LDLCALC 92 03/13/2022    Physical Findings: AIMS: Facial and Oral Movements Muscles of Facial Expression: None Lips and Perioral Area: None Jaw: None Tongue: None,Extremity Movements Upper (arms, wrists, hands, fingers): None Lower (legs, knees, ankles, toes): None, Trunk Movements Neck, shoulders, hips: None, Global Judgements Severity of abnormal movements overall :  None Incapacitation due to abnormal movements: None Patient's awareness of abnormal movements: No Awareness, Dental Status Current problems with teeth and/or dentures?: No Does patient usually wear dentures?: No Edentia?: No  CIWA:    COWS:     Musculoskeletal: Strength & Muscle Tone: within normal limits Gait & Station: normal Patient leans: N/A  Psychiatric Specialty Exam:  Presentation  General Appearance:  Disheveled; Appropriate for Environment  Eye Contact: Minimal  Speech: Pressured  Speech Volume: Increased  Handedness: Right   Mood and Affect  Mood: Anxious; Irritable  Affect: Congruent (Agitated, pacing, and verbally aggressive before redirection.)   Thought Process  Thought Processes: Linear (preoccupied with distress.)  Descriptions of Associations:Loose  Orientation:Full (Time, Place and Person)  Thought Content:Delusions  History of Schizophrenia/Schizoaffective disorder:Yes  Duration of Psychotic Symptoms:Greater than six months  Hallucinations:Hallucinations: None Description of Auditory Hallucinations: denies  Ideas of Reference:Percusatory  Suicidal Thoughts:Suicidal Thoughts: No  Homicidal Thoughts:Homicidal Thoughts: No   Sensorium  Memory: Immediate Fair; Remote Fair  Judgment: Impaired (during agitation; improved after intervention.)  Insight: Lacking (receptive to redirection and IM medication.)   Executive Functions  Concentration: Poor  Attention Span: Poor  Recall: [employer] of Knowledge: Fair  Language: Good   Psychomotor Activity  Psychomotor Activity: Psychomotor Activity: Normal   Assets  Assets: Communication Skills; Social Support; Health And Safety Inspector   Sleep  Sleep: Sleep: Good Number of Hours of Sleep: 7    Physical Exam: Physical Exam ROS Blood pressure 127/88, pulse (!) 103, temperature 98 F (36.7 C), height 6' 3 (1.905 m), weight 97.5 kg, SpO2 97%. Body  mass index is 26.87 kg/m.   Treatment Plan Summary: Daily contact with patient to assess and evaluate symptoms and progress in treatment and Medication management Administered medications to manage acute agitation and paranoia: Haloperidol  (Haldol ) 2 mg IM Lorazepam  (Ativan ) 1 mg IM Diphenhydramine  (Benadryl ) 25 mg IM Move the patient to a low-stimulation environment to reduce external triggers. Redirect patient when entering other rooms or engaging in disruptive behaviors. Set clear and consistent boundaries to ensure patient and staff safety. Brad GORMAN Moats, NP 05/06/2023, 4:26 PM

## 2023-05-06 NOTE — Group Note (Signed)
 Date:  05/06/2023 Time:  9:50 PM  Group Topic/Focus:  Making Healthy Choices:   The focus of this group is to help patients identify negative/unhealthy choices they were using prior to admission and identify positive/healthier coping strategies to replace them upon discharge.    Participation Level:  Did Not Attend   Dwayne Lars 05/06/2023, 9:50 PM

## 2023-05-06 NOTE — BHH Counselor (Signed)
 The LCSWA attempted to complete the PSA with the patient but the patient kept falling asleep during the assessment. The patient asked if the assessment could be completed at a different time.     Michael Chandler, MSW, [employer]

## 2023-05-06 NOTE — Group Note (Unsigned)
 Date:  05/06/2023 Time:  9:06 PM  Group Topic/Focus:  Making Healthy Choices:   The focus of this group is to help patients identify negative/unhealthy choices they were using prior to admission and identify positive/healthier coping strategies to replace them upon discharge.     Participation Level:  {BHH PARTICIPATION OZCZO:77735}  Participation Quality:  {BHH PARTICIPATION QUALITY:22265}  Affect:  {BHH AFFECT:22266}  Cognitive:  {BHH COGNITIVE:22267}  Insight: {BHH Insight2:20797}  Engagement in Group:  {BHH ENGAGEMENT IN HMNLE:77731}  Modes of Intervention:  {BHH MODES OF INTERVENTION:22269}  Additional Comments:  ***  Michael Chandler 05/06/2023, 9:06 PM

## 2023-05-06 NOTE — Plan of Care (Signed)
 Alert and oriented X4. Aggressively knocking on nurses station door. Redirected, ambulated back to room, and after report observed patient in room with eyes closed. Did not take scheduled medications due to sleeping; not awaken to avoid aggressive behaviors presented earlier throughout the shift. Safety checks performed every 15 mins. Care, comfort, and safety maintained/ongoing.   Problem: Education: Goal: Knowledge of  General Education information/materials will improve Outcome: Not Progressing Goal: Emotional status will improve Outcome: Not Progressing Goal: Mental status will improve Outcome: Not Progressing Goal: Verbalization of understanding the information provided will improve Outcome: Not Progressing   Problem: Activity: Goal: Interest or engagement in activities will improve Outcome: Not Progressing Goal: Sleeping patterns will improve Outcome: Not Progressing   Problem: Coping: Goal: Ability to verbalize frustrations and anger appropriately will improve Outcome: Not Progressing Goal: Ability to demonstrate self-control will improve Outcome: Not Progressing   Problem: Health Behavior/Discharge Planning: Goal: Identification of resources available to assist in meeting health care needs will improve Outcome: Not Progressing Goal: Compliance with treatment plan for underlying cause of condition will improve Outcome: Not Progressing   Problem: Physical Regulation: Goal: Ability to maintain clinical measurements within normal limits will improve Outcome: Not Progressing   Problem: Safety: Goal: Periods of time without injury will increase Outcome: Not Progressing

## 2023-05-06 NOTE — Plan of Care (Signed)
 Patient asked for morning medicines to his room states  I am not doing well. Patient came to nurses station states  call my mom to pick me up. Patient in the milieu and started verbally aggressive. Redirected patient back to his room. Receptive with IM medications. Patient denies SI,HI and AVH. Compliant with medications. Support and encouragement given.

## 2023-05-07 DIAGNOSIS — F203 Undifferentiated schizophrenia: Secondary | ICD-10-CM | POA: Diagnosis not present

## 2023-05-07 LAB — HELPER T-LYMPH-CD4 (ARMC ONLY)
% CD 4 Pos. Lymph.: 6.2 % — ABNORMAL LOW (ref 30.8–58.5)
Absolute CD 4 Helper: 43 /uL — ABNORMAL LOW (ref 359–1519)
Basophils Absolute: 0 10*3/uL (ref 0.0–0.2)
Basos: 1 %
EOS (ABSOLUTE): 0.2 10*3/uL (ref 0.0–0.4)
Eos: 4 %
Hematocrit: 36.3 % — ABNORMAL LOW (ref 37.5–51.0)
Hemoglobin: 11.6 g/dL — ABNORMAL LOW (ref 13.0–17.7)
Immature Grans (Abs): 0 10*3/uL (ref 0.0–0.1)
Immature Granulocytes: 0 %
Lymphocytes Absolute: 0.7 10*3/uL (ref 0.7–3.1)
Lymphs: 17 %
MCH: 26.8 pg (ref 26.6–33.0)
MCHC: 32 g/dL (ref 31.5–35.7)
MCV: 84 fL (ref 79–97)
Monocytes Absolute: 0.8 10*3/uL (ref 0.1–0.9)
Monocytes: 20 %
Neutrophils Absolute: 2.4 10*3/uL (ref 1.4–7.0)
Neutrophils: 58 %
Platelets: 282 10*3/uL (ref 150–450)
RBC: 4.33 x10E6/uL (ref 4.14–5.80)
RDW: 13.2 % (ref 11.6–15.4)
WBC: 4.1 10*3/uL (ref 3.4–10.8)

## 2023-05-07 MED ORDER — ENSURE ENLIVE PO LIQD
237.0000 mL | Freq: Two times a day (BID) | ORAL | Status: DC
  Administered 2023-05-07 – 2023-05-08 (×3): 237 mL via ORAL

## 2023-05-07 MED ORDER — ARIPIPRAZOLE ER 400 MG IM SRER
300.0000 mg | INTRAMUSCULAR | Status: DC
  Administered 2023-05-07: 300 mg via INTRAMUSCULAR
  Filled 2023-05-07: qty 2

## 2023-05-07 NOTE — BHH Counselor (Signed)
 CSW spoke with patient about short term inpatient treatment.   Patient explained that he is willing to give it a try.   CSW to touch base with [facility] on patient's behalf.    CSW team to continue to assess.    Tiberius Loftus, MSW, LCSWA 05/07/2023 1:14 PM
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
## 2023-05-07 NOTE — Group Note (Signed)
 Recreation Therapy Group Note   Group Topic:Emotion Expression  Group Date: 05/07/2023 Start Time: 1530 End Time: 1615 Facilitators: Celestia Jeoffrey FORBES ARTICE, CTRS Location:  Craft Room  Group Description: Painting a Peaceful Place. Patients and LRT discuss what it means to be "at peace", what it feels like physically and mentally. Pts are given a canvas and watercolor paint to use and encouraged to draw their idea of a peaceful place. Pts and LRT discuss how they use this in their daily life post discharge. Pts are encouraged to take their canvas home with them as a reminder to find their peaceful place whenever they are feeling depressed, anxious, etc.    Goal Area(s) Addressed:  Patient will identify what it means to experience a "peaceful" emotion. Patient will identify a new coping skill.  Patient will express their emotions through art. Patients will increase communication by talking with LRT and peers while in group.   Affect/Mood: N/A   Participation Level: Did not attend    Clinical Observations/Individualized Feedback: Patient did not attend group.   Plan: Continue to engage patient in RT group sessions 2-3x/week.   Jeoffrey FORBES Celestia, LRT, CTRS 05/07/2023 5:54 PM

## 2023-05-07 NOTE — Group Note (Signed)
 Date:  05/07/2023 Time:  11:04 AM  Group Topic/Focus:  Dimensions of Wellness:   The focus of this group is to introduce the topic of wellness and discuss the role each dimension of wellness plays in total health. Identifying Needs:   The focus of this group is to help patients identify their personal needs that have been historically problematic and identify healthy behaviors to address their needs.    Participation Level:  Did Not Attend   Michael Chandler 05/07/2023, 11:04 AM

## 2023-05-07 NOTE — BHH Suicide Risk Assessment (Signed)
 BHH INPATIENT:  Family/Significant Other Suicide Prevention Education  Suicide Prevention Education:  Education Completed; Marlee Mace, [phone]. father has been identified by the patient as the family member/significant other with whom the patient will be residing, and identified as the person(s) who will aid the patient in the event of a mental health crisis (suicidal ideations/suicide attempt).  With written consent from the patient, the family member/significant other has been provided the following suicide prevention education, prior to the and/or following the discharge of the patient.  The suicide prevention education provided includes the following: Suicide risk factors Suicide prevention and interventions National Suicide Hotline telephone number [facility] assessment telephone number [facility] Emergency Assistance 911 [facility] and/or Residential Mobile Crisis Unit telephone number  Request made of family/significant other to: Remove weapons (e.g., guns, rifles, knives), all items previously/currently identified as safety concern.   Remove drugs/medications (over-the-counter, prescriptions, illicit drugs), all items previously/currently identified as a safety concern.  The family member/significant other verbalizes understanding of the suicide prevention education information provided.  The family member/significant other agrees to remove the items of safety concern listed above.  Michael Chandler 05/07/2023, 2:33 PM

## 2023-05-07 NOTE — Progress Notes (Signed)
   05/06/23 2000  Psych Admission Type (Psych Patients Only)  Admission Status Involuntary  Psychosocial Assessment  Patient Complaints None  Eye Contact Brief  Facial Expression Angry  Affect Blunted;Appropriate to circumstance  Speech Logical/coherent  Interaction Assertive  Motor Activity Slow  Appearance/Hygiene Disheveled;Body odor  Behavior Characteristics Cooperative  Mood Pleasant  Thought Process  Coherency Disorganized  Content WDL  Delusions None reported or observed  Perception WDL  Hallucination None reported or observed  Judgment Impaired  Confusion WDL  Danger to Self  Current suicidal ideation? Denies  Danger to Others  Danger to Others None reported or observed  Danger to Others Abnormal  Harmful Behavior to others No threats or harm toward other people  Destructive Behavior No threats or harm toward property   Patient alert and oriented x 4, he is demanding, intrusive and verbally aggressive to staff. Patient is not receptive when redirected he uses profanities and bang the door, his thoughts are disorganized , no insight into his mental health illness, he is fixated on waking up multiple times at night to request for snack. 15 minutes safety checks maintained will continue to monitor.

## 2023-05-07 NOTE — Group Note (Signed)
 Recreation Therapy Group Note   Group Topic:Healthy Support Systems  Group Date: 05/07/2023 Start Time: 1010 End Time: 1110 Facilitators: Celestia Jeoffrey BRAVO, LRT, CTRS Location:  Craft Room  Group Description: Straw Bridge. In groups or individually, patients were given 10 plastic drinking straws and an equal length of masking tape. Using the materials provided, patients were instructed to build a free-standing bridge-like structure to suspend an everyday item (ex: deck of cards) off the floor or table surface. All materials were required to be used in secondary school teacher. LRT facilitated post-activity discussion reviewing the importance of having strong and healthy support systems in our lives. LRT discussed how the people in our lives serve as the tape and the deck of cards we placed on top of our straw structure are the stressors we face in daily life. LRT and pts discussed what happens in our life when things get too heavy for us , and we don't have strong supports outside of the hospital. Pt shared 2 of their healthy supports in their life aloud in the group.   Goal Area(s) Addressed:  Patient will identify 2 healthy supports in their life. Patient will identify skills to successfully complete activity. Patient will identify correlation of this activity to life post-discharge.  Patient will build on frustration tolerance skills. Patient will increase team building and communication skills   Affect/Mood: N/A   Participation Level: Did not attend    Clinical Observations/Individualized Feedback: Patient did not attend group.   Plan: Continue to engage patient in RT group sessions 2-3x/week.   Jeoffrey BRAVO Celestia, LRT, CTRS 05/07/2023 12:17 PM
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
## 2023-05-07 NOTE — Progress Notes (Signed)
 [facility] MD Progress Note  05/07/2023 11:54 AM Michael Chandler  MRN:  [id number] Subjective:  38 year old African American male with a diagnosis of schizophrenia, has a history of medication non-adherence and substance use to cope. The patient's mother reports he frequently refuses medication, resulting in severe episodes. He is currently prescribed Haldol  2 mg twice daily but has not been compliant with this regimen. The patient has expressed a willingness to start a long-acting injectable (LAI) for long-term treatment. His mother is pursuing guardianship to assist with his care but is concerned about his noncompliance with the comprehensive assessment for guardianship, as he has refused this process in the past.The patient has a chronic mental illness (schizophrenia) complicated by medication non-adherence and substance use. His behavior poses significant safety risks to his family, particularly his mother, who has limited mobility. The patient has shown some insight by agreeing to start a long-acting injectable (LAI) medication. However, his ongoing refusal to comply with oral medications and his history of refusing necessary assessments, such as guardianship, highlight the need for structured support. His mother and father are advocating for short-term inpatient treatment to stabilize his condition.   Principal Problem: Schizophrenia, undifferentiated (HCC) Diagnosis: Principal Problem:   Schizophrenia, undifferentiated (HCC) Active Problems:   Methamphetamine abuse (HCC)  Total Time spent with patient: 2 hours  Past Psychiatric History: see below  Past Medical History:  Past Medical History:  Diagnosis Date   HIV (human immunodeficiency virus infection) (HCC)    Schizophrenia (HCC)    per IVC paperwork, pt states does not have this dx    Past Surgical History:  Procedure Laterality Date   IR US  GUIDE BX ASP/DRAIN  03/21/2022   Family History: History reviewed. No pertinent family  history. Family Psychiatric  History: none reported Social History:  Social History   Substance and Sexual Activity  Alcohol  Use Yes   Comment: occasional     Social History   Substance and Sexual Activity  Drug Use Yes   Types: Methamphetamines, Marijuana, Cocaine   Comment: states occasional marijuana and meth use    Social History   Socioeconomic History   Marital status: Single    Spouse name: Not on file   Number of children: Not on file   Years of education: Not on file   Highest education level: Not on file  Occupational History   Not on file  Tobacco Use   Smoking status: Former    Types: Cigarettes   Smokeless tobacco: Never   Tobacco comments:    Patient denies using tobacco products (03/13/22)  Substance and Sexual Activity   Alcohol  use: Yes    Comment: occasional   Drug use: Yes    Types: Methamphetamines, Marijuana, Cocaine    Comment: states occasional marijuana and meth use   Sexual activity: Not Currently    Comment: condoms accepted  Other Topics Concern   Not on file  Social History Narrative   Not on file   Social Drivers of Health   Financial Resource Strain: Low Risk  (12/29/2022)   Received from [employer] [facility]    Financial Concerns: none of these  Food Insecurity: No Food Insecurity (05/04/2023)   Hunger Vital Sign    Worried About Running Out of Food in the Last Year: Never true    Ran Out of Food in the Last Year: Never true  Transportation Needs: No Transportation Needs (05/04/2023)   PRAPARE - Administrator, Civil Service (Medical):  No    Lack of Transportation (Non-Medical): No  Physical Activity: Not on file  Stress: Not on file  Social Connections: High Risk (02/14/2022)   [facility]   Social Connections    How often do you see or talk to people that you care about and feel close to? (For example: talking to friends on  the phone, visiting friends or family, going to church or club meetings): Less than once a week   Additional Social History:                         Sleep: Fair  Appetite:  Good  Current Medications: Current Facility-Administered Medications  Medication Dose Route Frequency Provider Last Rate Last Admin   acetaminophen  (TYLENOL ) tablet 650 mg  650 mg Oral Q6H PRN McLauchlin, Angela, NP       alum & mag hydroxide-simeth (MAALOX/MYLANTA) 200-200-20 MG/5ML suspension 30 mL  30 mL Oral Q4H PRN McLauchlin, Angela, NP       amoxicillin -clavulanate (AUGMENTIN ) 875-125 MG per tablet 1 tablet  1 tablet Oral Q12H Nicholaus Brad RAMAN, NP   1 tablet at 05/07/23 0813   benztropine  (COGENTIN ) tablet 1 mg  1 mg Oral Daily Iyania Denne S, NP   1 mg at 05/07/23 9185   haloperidol  (HALDOL ) tablet 5 mg  5 mg Oral TID PRN McLauchlin, Angela, NP   5 mg at 05/07/23 1026   And   diphenhydrAMINE  (BENADRYL ) capsule 50 mg  50 mg Oral TID PRN McLauchlin, Angela, NP   50 mg at 05/07/23 1026   haloperidol  lactate (HALDOL ) injection 5 mg  5 mg Intramuscular TID PRN McLauchlin, Angela, NP       And   diphenhydrAMINE  (BENADRYL ) injection 50 mg  50 mg Intramuscular TID PRN McLauchlin, Jon, NP       And   LORazepam  (ATIVAN ) injection 2 mg  2 mg Intramuscular TID PRN McLauchlin, Angela, NP       haloperidol  lactate (HALDOL ) injection 10 mg  10 mg Intramuscular TID PRN McLauchlin, Angela, NP       And   diphenhydrAMINE  (BENADRYL ) injection 50 mg  50 mg Intramuscular TID PRN McLauchlin, Angela, NP       And   LORazepam  (ATIVAN ) injection 2 mg  2 mg Intramuscular TID PRN McLauchlin, Angela, NP       dolutegravir  (TIVICAY ) tablet 50 mg  50 mg Oral Daily Nicholaus Brad RAMAN, NP   50 mg at 05/07/23 0813   emtricitabine -tenofovir  AF (DESCOVY ) 200-25 MG per tablet 1 tablet  1 tablet Oral Daily Nicholaus Brad RAMAN, NP   1 tablet at 05/07/23 0813   gabapentin  (NEURONTIN ) capsule 300 mg  300 mg Oral TID Lory Nowaczyk S, NP   300 mg at  05/07/23 0813   haloperidol  (HALDOL ) tablet 2 mg  2 mg Oral BID Jordyn Hofacker S, NP   2 mg at 05/07/23 9186   hydrOXYzine  (ATARAX ) tablet 25 mg  25 mg Oral TID PRN McLauchlin, Angela, NP       magnesium  hydroxide (MILK OF MAGNESIA) suspension 30 mL  30 mL Oral Daily PRN McLauchlin, Angela, NP       melatonin tablet 5 mg  5 mg Oral QHS Nicholaus Brad RAMAN, NP       traZODone  (DESYREL ) tablet 50 mg  50 mg Oral QHS PRN McLauchlin, Jon, NP        Lab Results:  Results for orders  placed or performed during the hospital encounter of 05/04/23 (from the past 48 hours)  Lipid panel     Status: Abnormal   Collection Time: 05/05/23 12:48 PM  Result Value Ref Range   Cholesterol 139 0 - 200 mg/dL   Triglycerides 891 <849 mg/dL   HDL 40 (L) >59 mg/dL   Total CHOL/HDL Ratio 3.5 RATIO   VLDL 22 0 - 40 mg/dL   LDL Cholesterol 77 0 - 99 mg/dL    Comment:        Total Cholesterol/HDL:CHD Risk Coronary Heart Disease Risk Table                     Men   Women  1/2 Average Risk   3.4   3.3  Average Risk       5.0   4.4  2 X Average Risk   9.6   7.1  3 X Average Risk  23.4   11.0        Use the calculated Patient Ratio above and the CHD Risk Table to determine the patient's CHD Risk.        ATP III CLASSIFICATION (LDL):  <100     mg/dL   Optimal  899-870  mg/dL   Near or Above                    Optimal  130-159  mg/dL   Borderline  839-810  mg/dL   High  >809     mg/dL   Very High Performed at [facility], [address] [place], [place] [address]   RPR     Status: Abnormal   Collection Time: 05/05/23 12:48 PM  Result Value Ref Range   RPR Ser Ql Reactive (A) NON REACTIVE    Comment: SENT FOR CONFIRMATION   RPR Titer 1:2     Comment: Performed at [facility], [address], [place], [place] [address]    Blood Alcohol  level:  Lab Results  Component Value Date   [facility] <10 05/02/2023   ETH <10 07/03/2022    Metabolic Disorder Labs: Lab Results  Component Value Date    HGBA1C 6.4 (H) 05/02/2023   MPG 136.98 05/02/2023   MPG 116.89 03/04/2022   No results found for: PROLACTIN Lab Results  Component Value Date   CHOL 139 05/05/2023   TRIG 108 05/05/2023   HDL 40 (L) 05/05/2023   CHOLHDL 3.5 05/05/2023   VLDL 22 05/05/2023   LDLCALC 77 05/05/2023   LDLCALC 92 03/13/2022    Physical Findings: AIMS: Facial and Oral Movements Muscles of Facial Expression: None Lips and Perioral Area: None Jaw: None Tongue: None,Extremity Movements Upper (arms, wrists, hands, fingers): None Lower (legs, knees, ankles, toes): None, Trunk Movements Neck, shoulders, hips: None, Global Judgements Severity of abnormal movements overall : None Incapacitation due to abnormal movements: None Patient's awareness of abnormal movements: No Awareness, Dental Status Current problems with teeth and/or dentures?: No Does patient usually wear dentures?: No Edentia?: No  CIWA:    COWS:     Musculoskeletal: Strength & Muscle Tone: within normal limits Gait & Station: normal Patient leans: N/A  Psychiatric Specialty Exam:  Presentation  General Appearance:  Disheveled; Appropriate for Environment  Eye Contact: Minimal  Speech: Pressured  Speech Volume: Increased  Handedness: Right   Mood and Affect  Mood: Anxious; Irritable  Affect: Congruent (Agitated, pacing, and verbally aggressive before redirection.)   Thought Process  Thought Processes:  Linear (preoccupied with distress.)  Descriptions of Associations:Loose  Orientation:Full (Time, Place and Person)  Thought Content:Delusions  History of Schizophrenia/Schizoaffective disorder:Yes  Duration of Psychotic Symptoms:Greater than six months  Hallucinations:Hallucinations: None Description of Auditory Hallucinations: denies  Ideas of Reference:Percusatory  Suicidal Thoughts:Suicidal Thoughts: No  Homicidal Thoughts:Homicidal Thoughts: No   Sensorium  Memory: Immediate Fair; Remote  Fair  Judgment: Impaired (during agitation; improved after intervention.)  Insight: Lacking (receptive to redirection and IM medication.)   Executive Functions  Concentration: Poor  Attention Span: Poor  Recall: [employer] of Knowledge: Fair  Language: Good   Psychomotor Activity  Psychomotor Activity: Psychomotor Activity: Normal   Assets  Assets: Communication Skills; Social Support; Health And Safety Inspector   Sleep  Sleep: Sleep: Good Number of Hours of Sleep: 7    Physical Exam: Physical Exam Vitals and nursing note reviewed.  HENT:     Head: Normocephalic and atraumatic.     Nose: Nose normal.  Pulmonary:     Effort: Pulmonary effort is normal.  Musculoskeletal:        General: Normal range of motion.     Cervical back: Normal range of motion.  Neurological:     General: No focal deficit present.     Mental Status: He is alert. Mental status is at baseline.  Psychiatric:        Attention and Perception: Attention and perception normal.        Mood and Affect: Mood is anxious. Affect is labile and blunt.        Speech: Speech is rapid and pressured.        Behavior: Behavior is hyperactive. Behavior is cooperative.        Thought Content: Thought content is delusional.        Cognition and Memory: Cognition is impaired. He exhibits impaired recent memory.        Judgment: Judgment is impulsive and inappropriate.    Review of Systems  Psychiatric/Behavioral:  The patient is nervous/anxious and has insomnia.   All other systems reviewed and are negative.  Blood pressure (!) 95/49, pulse 77, temperature 98.2 F (36.8 C), resp. rate 18, height 6' 3 (1.905 m), weight 97.5 kg, SpO2 97%. Body mass index is 26.87 kg/m.   Treatment Plan Summary: Daily contact with patient to assess and evaluate symptoms and progress in treatment and Medication management Amoxicillin -Clavulanate (Augmentin ) 875-125 mg x6 days for oral  infection Benztropine  (Cogentin ) 1 mg  Management of extrapyramidal symptoms (EPS) often related to antipsychotic use. Dolutegravir  (Tivicay ) 50 mg Antiretroviral therapy for HIV Emtricitabine -Tenofovir  AF (Descovy ) 200-25 mg Antiretroviral therapy for HIV or pre-exposure prophylaxis (PrEP). Gabapentin  (Neurontin ) 300 mg mood stabilization. Haloperidol  (Haldol ) 5 mg for schizophrenia Trazodone  (Desyrel ) 150 mg  for insomnia  Continue regular monitoring for psychosis, mood instability, anxiety, pain, and insomnia. Evaluate for any new or worsening symptom Brad GORMAN Moats, NP 05/07/2023, 11:54 AM

## 2023-05-07 NOTE — Group Note (Signed)
 [facility] LCSW Group Therapy Note    Group Date: 05/07/2023 Start Time: 1330 End Time: 1430  Type of Therapy and Topic:  Group Therapy:  Overcoming Obstacles  Participation Level:  BHH PARTICIPATION LEVEL: Did Not Attend  Mood:  Description of Group:   In this group patients will be encouraged to explore what they see as obstacles to their own wellness and recovery. They will be guided to discuss their thoughts, feelings, and behaviors related to these obstacles. The group will process together ways to cope with barriers, with attention given to specific choices patients can make. Each patient will be challenged to identify changes they are motivated to make in order to overcome their obstacles. This group will be process-oriented, with patients participating in exploration of their own experiences as well as giving and receiving support and challenge from other group members.  Therapeutic Goals: 1. Patient will identify personal and current obstacles as they relate to admission. 2. Patient will identify barriers that currently interfere with their wellness or overcoming obstacles.  3. Patient will identify feelings, thought process and behaviors related to these barriers. 4. Patient will identify two changes they are willing to make to overcome these obstacles:    Summary of Patient Progress Patient did not attend group.   Therapeutic Modalities:   Cognitive Behavioral Therapy Solution Focused Therapy Motivational Interviewing Relapse Prevention Therapy   Sherryle JINNY Margo, LCSW

## 2023-05-07 NOTE — Plan of Care (Signed)
  Problem: Education: Goal: Mental status will improve Outcome: Progressing   Problem: Education: Goal: Emotional status will improve Outcome: Progressing   Problem: Education: Goal: Verbalization of understanding the information provided will improve Outcome: Progressing   Problem: Education: Goal: Mental status will improve Outcome: Progressing   Problem: Education: Goal: Verbalization of understanding the information provided will improve Outcome: Progressing

## 2023-05-07 NOTE — Plan of Care (Signed)
 After talking to his mom patient came to staff asking for  Benadryl  to relax. Benadryl  given with Haldol . No physical or verbally aggressive behaviors noted. Patient receptive with redirection. Denies SI,HI and AVH. Appetite and energy level good. Support and encouragement given.

## 2023-05-07 NOTE — Plan of Care (Signed)
  Problem: Education: Goal: Mental status will improve Outcome: Progressing   Problem: Coping: Goal: Ability to verbalize frustrations and anger appropriately will improve Outcome: Progressing Goal: Ability to demonstrate self-control will improve Outcome: Progressing

## 2023-05-07 NOTE — B[facility] Counselor (Signed)
 CSW provided patient with requested resources for housing.   CSW team to follow up. Patient expressed no other needs at this time.   Reymundo Poll, MSW, LCSWA 05/07/2023 11:31 AM

## 2023-05-07 NOTE — BHH Counselor (Signed)
 CSW reached Marlon at [phone].  Father shared that he would like patient to go to short term inpatient substance treatment.   Father added that the patient is usually not receptive when he doesn't get told what he wants.  Father is working on a way to approach patient with a conversation about treatment so that he doesn't feel attacked.   Father is planning to have a conversation with patient to present him with a plan. Father will call patient in a few hours.   CSW team to continue to assess.   Francyne Arreaga, MSW, LCSWA 05/07/2023 11:55 AM

## 2023-05-08 ENCOUNTER — Other Ambulatory Visit: Payer: Self-pay

## 2023-05-08 MED ORDER — TRAZODONE HCL 50 MG PO TABS
50.0000 mg | ORAL_TABLET | Freq: Every evening | ORAL | 0 refills | Status: AC | PRN
  Filled 2023-05-08: qty 30, 30d supply, fill #0

## 2023-05-08 MED ORDER — DESCOVY 200-25 MG PO TABS
1.0000 | ORAL_TABLET | Freq: Every day | ORAL | 0 refills | Status: AC
  Filled 2023-05-08: qty 30, 30d supply, fill #0

## 2023-05-08 MED ORDER — RIFABUTIN 150 MG PO CAPS
300.0000 mg | ORAL_CAPSULE | Freq: Every day | ORAL | 3 refills | Status: AC
  Filled 2023-05-08: qty 60, 30d supply, fill #0

## 2023-05-08 MED ORDER — ETHAMBUTOL HCL 400 MG PO TABS
1200.0000 mg | ORAL_TABLET | Freq: Every day | ORAL | 0 refills | Status: AC
  Filled 2023-05-08 (×3): qty 90, 30d supply, fill #0

## 2023-05-08 MED ORDER — HALOPERIDOL 2 MG PO TABS
2.0000 mg | ORAL_TABLET | Freq: Two times a day (BID) | ORAL | 0 refills | Status: AC
  Filled 2023-05-08: qty 60, 30d supply, fill #0

## 2023-05-08 MED ORDER — MELATONIN 5 MG PO TABS
5.0000 mg | ORAL_TABLET | Freq: Every day | ORAL | 0 refills | Status: AC
  Filled 2023-05-08: qty 30, 30d supply, fill #0

## 2023-05-08 MED ORDER — TIVICAY 50 MG PO TABS
50.0000 mg | ORAL_TABLET | Freq: Every day | ORAL | 0 refills | Status: AC
  Filled 2023-05-08: qty 30, 30d supply, fill #0

## 2023-05-08 MED ORDER — GABAPENTIN 300 MG PO CAPS
300.0000 mg | ORAL_CAPSULE | Freq: Three times a day (TID) | ORAL | 0 refills | Status: AC
  Filled 2023-05-08: qty 90, 30d supply, fill #0

## 2023-05-08 MED ORDER — BENZTROPINE MESYLATE 1 MG PO TABS
1.0000 mg | ORAL_TABLET | Freq: Every day | ORAL | 0 refills | Status: AC
  Filled 2023-05-08: qty 30, 30d supply, fill #0

## 2023-05-08 NOTE — Plan of Care (Signed)

## 2023-05-08 NOTE — Group Note (Signed)
 Date:  05/08/2023 Time:  10:14 AM  Group Topic/Focus:  Goals Group:   The focus of this group is to help patients establish daily goals to achieve during treatment and discuss how the patient can incorporate goal setting into their daily lives to aide in recovery.    Participation Level:  Active  Participation Quality:  Appropriate  Affect:  Appropriate  Cognitive:  Appropriate  Insight: Appropriate  Engagement in Group:  Engaged  Modes of Intervention:  Discussion, Education, and Support  Additional Comments:    Michael Chandler 05/08/2023, 10:14 AM

## 2023-05-08 NOTE — Progress Notes (Signed)
 Suicide Risk Assessment  Discharge Assessment    [facility] Discharge Suicide Risk Assessment   Principal Problem: Schizophrenia, undifferentiated (HCC) Discharge Diagnoses: Principal Problem:   Schizophrenia, undifferentiated (HCC) Active Problems:   Methamphetamine abuse (HCC)   Total Time spent with patient: 45 minutes  Musculoskeletal: Strength & Muscle Tone: within normal limits Gait & Station: normal Patient leans: N/A  Psychiatric Specialty Exam  Presentation  General Appearance:  Disheveled  Eye Contact: Good  Speech: Pressured (perseverative at times.)  Speech Volume: Normal  Handedness: Right   Mood and Affect  Mood: Anxious (Cooperative but occasionally agitated during the interaction. No observed psychomotor agitation or retardation.)  Duration of Depression Symptoms: No data recorded Affect: Labile (frustrated and misunderstood.)   Thought Process  Thought Processes: Disorganized  Descriptions of Associations:Tangential  Orientation:Full (Time, Place and Person)  Thought Content:Rumination; Delusions (with occasional derailment.)  History of Schizophrenia/Schizoaffective disorder:Yes  Duration of Psychotic Symptoms:Greater than six months  Hallucinations:Hallucinations: None Description of Auditory Hallucinations: denies  Ideas of Reference:Delusions (demands for his sister to leave her husband)  Suicidal Thoughts:Suicidal Thoughts: No  Homicidal Thoughts:Homicidal Thoughts: No   Sensorium  Memory: Immediate Fair; Remote Fair  Judgment: Impaired (ongoing refusal of medication and inappropriate demands on family members suggest significant deficits.)  Insight: Poor (demonstrates poor understanding of the need for medication adherence and the impact of his behavior on family safety)   Executive Functions  Concentration: Poor  Attention Span: Poor  Recall: Poor  Fund of  Knowledge: Fair  Language: Good   Psychomotor Activity  Psychomotor Activity: Psychomotor Activity: Normal   Assets  Assets: Communication Skills; Social Support; Health And Safety Inspector   Sleep  Sleep: Sleep: Fair Number of Hours of Sleep: 6   Physical Exam: Physical Exam see discharge summary ROS see discharge summary Blood pressure 123/78, pulse 100, temperature 98.2 F (36.8 C), resp. rate 16, height 6' 3 (1.905 m), weight 97.5 kg, SpO2 99%. Body mass index is 26.87 kg/m.  Mental Status Per Nursing Assessment::   On Admission:  NA  Demographic Factors:  Male and Unemployed  Loss Factors: NA  Historical Factors: Family history of mental illness or substance abuse and Impulsivity  Risk Reduction Factors:   Living with another person, especially a relative and Positive social support  Continued Clinical Symptoms:  Schizophrenia:   Less than 15 years old More than one psychiatric diagnosis Unstable or Poor Therapeutic Relationship Previous Psychiatric Diagnoses and Treatments  Cognitive Features That Contribute To Risk:  None    Suicide Risk:  Minimal: No identifiable suicidal ideation.  Patients presenting with no risk factors but with morbid ruminations; may be classified as minimal risk based on the severity of the depressive symptoms   Follow-up Information     [facility], [facility]. Go to.   Why: In person appointment 05/21/23 at 9 AM. Contact information: [address] [place] [address] [phone]                 Plan Of Care/Follow-up recommendations:  Patient is stable for discharge  Corleen Otwell E Khadija Thier, NP 05/08/2023, 4:55 PM

## 2023-05-08 NOTE — Group Note (Signed)
 Date:  05/08/2023 Time:  12:54 AM  Group Topic/Focus:  Wrap-Up Group:   The focus of this group is to help patients review their daily goal of treatment and discuss progress on daily workbooks.    Participation Level:  Minimal  Participation Quality:  Appropriate  Affect:  Appropriate  Cognitive:  Appropriate  Insight: Good  Engagement in Group:  Engaged  Modes of Intervention:  Discussion  Additional Comments:    Tommas CHRISTELLA Bunker 05/08/2023, 12:54 AM

## 2023-05-08 NOTE — Progress Notes (Signed)
 Patient approaches nurses station states he thinks his shoulder is broken. States the pain is to much.

## 2023-05-08 NOTE — Progress Notes (Signed)
   05/08/23 1400  Spiritual Encounters  Type of Visit Initial  Care provided to: Patient  Conversation partners present during encounter Social worker/Care management/TOC  Referral source Social worker/Care management/TOC  Reason for visit Routine spiritual support  OnCall Visit No   Social Worker asked chaplain to follow up with patient. Chaplain met with patient in the hallway to see how he was. Patient shared with chaplain that he was having a good day and feeling pretty good. Patient told me that he was about to make a phone call but could talk later. Chaplain will follow up with patient.

## 2023-05-08 NOTE — Progress Notes (Signed)
  [facility] Adult Case Management Discharge Plan :  Will you be returning to the same living situation after discharge:  Yes,  Patient to return home.  At discharge, do you have transportation home?: Yes,  CSW to arrange transportation prior to discharge.  Do you have the ability to pay for your medications: Yes, MEDICARE / MEDICARE PART A AND B   Release of information consent forms completed and in the chart;  Patient's signature needed at discharge.  Patient to Follow up at:  Follow-up Information     [facility], [facility]. Go to.   Why: In person appointment 05/21/23 at 9 AM. Contact information: [address] [place] [address] [phone]                 Next level of care provider has access to [facility] Link:no  Safety Planning and Suicide Prevention discussed: Yes,  SPE conducted with patient's father. SPE material given at discharge.      Has patient been referred to the Quitline?: Patient refused referral for treatment  Patient has been referred for addiction treatment: Yes, the patient will follow up with an outpatient provider for substance use disorder. Therapist: appointment made  Michael CHRISTELLA Kerns, LCSW 05/08/2023, 2:51 PM

## 2023-05-08 NOTE — Group Note (Signed)
 Recreation Therapy Group Note   Group Topic:Health and Wellness  Group Date: 05/08/2023 Start Time: 1530 End Time: 1600 Facilitators: Celestia Jeoffrey BRAVO, LRT, CTRS Location:  Dayroom  Group Description: Seated Exercise. LRT discussed the mental and physical benefits of exercise. LRT and group discussed how physical activity can be used as a coping skill. Pt's and LRT followed along to an exercise video on the TV screen that provided a visual representation and audio description of every exercise performed. Pt's encouraged to listen to their bodies and stop at any time if they experience feelings of discomfort or pain. Pts were encouraged to drink water and stay hydrated.   Goal Area(s) Addressed: Patient will learn benefits of physical activity. Patient will identify exercise as a coping skill.  Patient will follow multistep directions. Patient will try a new leisure interest.    Affect/Mood: N/A   Participation Level: Did not attend    Clinical Observations/Individualized Feedback: Patient did not attend group.   Plan: Continue to engage patient in RT group sessions 2-3x/week.   Jeoffrey BRAVO Celestia, LRT, CTRS 05/08/2023 5:34 PM

## 2023-05-08 NOTE — Progress Notes (Signed)
 Patient voiced no concerns or complaints. Denies SI, HI, AVH. Pleasant and cooperative with care. Visible in milieu. Encouragement and support provided. Safety checks maintained. Medications given as prescribed. Pt receptive and remains safe on unit with q 15 min checlks

## 2023-05-08 NOTE — Progress Notes (Signed)
 Patient ID: Michael Chandler, male   DOB: January 16, 1986, 38 y.o.   MRN: [id number]  Discharge Note:  Patient denies SI/HI/AVH at this time. Discharge instructions, AVS, medication supply, and transition record gone over with patient. Patient given a copy of his Suicide Safety Plan. Patient agrees to comply with medication management, follow-up visit, and outpatient therapy. Patient belongings returned to patient. Patient questions and concerns addressed and answered. Patient ambulatory off unit. Patient discharged to home via [employer] services.

## 2023-05-08 NOTE — [facility] Counselor (Addendum)
 CSW touched base with patient's mother Lonell at [phone] and patient's father Marlee Mace at [phone]  to engage in safe discharge planning.  Mother is in agreement with patient returning home but both parents want patient to seek inpatient treatment.   CSW spoke with the patient and family of outpatient services since the patient declined inpatient. All in agreement. Patient scheduled with [facility].   Substance use resources sent to the patient's father via email at his request.   This has been communicated to NP and CSW team.   CSW team to continue to assess.    Theressa Piedra, MSW, LCSWA 05/08/2023 2:20 PM

## 2023-05-08 NOTE — Group Note (Signed)
 Recreation Therapy Group Note   Group Topic:Relaxation  Group Date: 05/08/2023 Start Time: 1000 End Time: 1050 Facilitators: Celestia Jeoffrey BRAVO, LRT, CTRS Location:  Craft Room  Group Description: PMR (Progressive Muscle Relaxation). LRT asks patients their current level of stress/anxiety from 1-10, with 10 being the highest. LRT educates patients on what PMR is and the benefits that come from it. Patients are asked to sit with their feet flat on the floor while sitting up and all the way back in their chair, if possible. LRT and pts follow a prompt through a speaker that requires you to tense and release different muscles in their body and focus on their breathing. During session, lights are off and soft music is being played. Pts are given a stress ball to use if needed. At the end of the prompt, LRT asks patients to rank their current levels of stress/anxiety from 1-10, 10 being the highest. LRT provides patients with an education handout on PMR.   Goal Area(s) Addressed:  Patients will be able to describe progressive muscle relaxation.  Patient will practice using relaxation technique. Patient will identify a new coping skill.  Patient will follow multistep directions to reduce anxiety and stress.   Affect/Mood: N/A   Participation Level: Did not attend    Clinical Observations/Individualized Feedback: Patient did not attend group.   Plan: Continue to engage patient in RT group sessions 2-3x/week.   Jeoffrey BRAVO Celestia, LRT, CTRS 05/08/2023 11:44 AM

## 2023-05-08 NOTE — Progress Notes (Signed)
   05/08/23 1300  Psych Admission Type (Psych Patients Only)  Admission Status Involuntary  Psychosocial Assessment  Patient Complaints None  Eye Contact Fair  Facial Expression Pained (complaints of shoulder pain)  Affect Appropriate to circumstance  Speech Logical/coherent  Interaction Assertive  Motor Activity Slow  Appearance/Hygiene Disheveled;In scrubs  Behavior Characteristics Cooperative;Appropriate to situation  Mood Pleasant (patient states I'm good, just ready to go home.)  Aggressive Behavior  Effect No apparent injury  Thought Process  Coherency WDL  Content WDL  Delusions None reported or observed  Perception WDL  Hallucination None reported or observed  Judgment WDL  Confusion None  Danger to Self  Current suicidal ideation? Denies  Agreement Not to Harm Self Yes  Description of Agreement Verbal  Danger to Others  Danger to Others None reported or observed  Danger to Others Abnormal  Harmful Behavior to others No threats or harm toward other people  Destructive Behavior No threats or harm toward property

## 2023-05-08 NOTE — Group Note (Signed)
 LCSW Group Therapy Note  Group Date: 05/08/2023 Start Time: 1300 End Time: 1405   Type of Therapy and Topic:  Group Therapy - Healthy vs Unhealthy Coping Skills  Participation Level:  Active   Description of Group The focus of this group was to determine what unhealthy coping techniques typically are used by group members and what healthy coping techniques would be helpful in coping with various problems. Patients were guided in becoming aware of the differences between healthy and unhealthy coping techniques. Patients were asked to identify 2-3 healthy coping skills they would like to learn to use more effectively.  Therapeutic Goals Patients learned that coping is what human beings do all day long to deal with various situations in their lives Patients defined and discussed healthy vs unhealthy coping techniques Patients identified their preferred coping techniques and identified whether these were healthy or unhealthy Patients determined 2-3 healthy coping skills they would like to become more familiar with and use more often. Patients provided support and ideas to each other   Summary of Patient Progress:  During group, Patient expressed willingly. Patient proved open to input from peers and feedback from CSW. Patient demonstrated proficient insight into the subject matter, was respectful of peers, and participated throughout the entire session.   Therapeutic Modalities Cognitive Behavioral Therapy Motivational Interviewing  Tyshon Fanning M Shalay Carder, LCSWA 05/08/2023  3:05 PM

## 2023-05-08 NOTE — [facility] Suicide Risk Assessment (Addendum)
 CSW made phone call to patient's sister Soham Hollett at [phone]  with patient's consent to discuss discharge plan.   Sister did not answer.    CSW left HIPAA compliant voicemail.       Reymundo Poll, MSW, LCSWA 05/08/2023 1:57 PM

## 2023-05-11 LAB — T.PALLIDUM AB, TOTAL: T Pallidum Abs: REACTIVE — AB

## 2023-05-13 NOTE — Discharge Summary (Signed)
 Physician Discharge Summary Note  Patient:  Michael Chandler is an 38 y.o., male MRN:  [id number] DOB:  January 16, 1986 Patient phone:  [phone] (home)  Patient address:   [address] [place] [address],  Total Time spent with patient: 1 hour  Date of Admission:  05/04/2023 Date of Discharge: 05/08/2023  Reason for Admission:   Principal Problem: Schizophrenia, undifferentiated (HCC) Discharge Diagnoses: Principal Problem:   Schizophrenia, undifferentiated (HCC) Active Problems:   Methamphetamine abuse (HCC)   Past Psychiatric History:  see h&p  Past Medical History:  Past Medical History:  Diagnosis Date   HIV (human immunodeficiency virus infection) (HCC)    Schizophrenia (HCC)    per IVC paperwork, pt states does not have this dx    Past Surgical History:  Procedure Laterality Date   IR US  GUIDE BX ASP/DRAIN  03/21/2022   Family History: History reviewed. No pertinent family history. Family Psychiatric  History:  see h&p Social History:  Social History   Substance and Sexual Activity  Alcohol  Use Yes   Comment: occasional     Social History   Substance and Sexual Activity  Drug Use Yes   Types: Methamphetamines, Marijuana, Cocaine   Comment: states occasional marijuana and meth use    Social History   Socioeconomic History   Marital status: Single    Spouse name: Not on file   Number of children: Not on file   Years of education: Not on file   Highest education level: Not on file  Occupational History   Not on file  Tobacco Use   Smoking status: Former    Types: Cigarettes   Smokeless tobacco: Never   Tobacco comments:    Patient denies using tobacco products (03/13/22)  Substance and Sexual Activity   Alcohol  use: Yes    Comment: occasional   Drug use: Yes    Types: Methamphetamines, Marijuana, Cocaine    Comment: states occasional marijuana and meth use   Sexual activity: Not Currently    Comment: condoms accepted  Other Topics Concern    Not on file  Social History Narrative   Not on file   Social Drivers of Health   Financial Resource Strain: Low Risk  (12/29/2022)   Received from [facility]    Financial Concerns: none of these  Food Insecurity: No Food Insecurity (05/04/2023)   Hunger Vital Sign    Worried About Running Out of Food in the Last Year: Never true    Ran Out of Food in the Last Year: Never true  Transportation Needs: No Transportation Needs (05/04/2023)   PRAPARE - Administrator, Civil Service (Medical): No    Lack of Transportation (Non-Medical): No  Physical Activity: Not on file  Stress: Not on file  Social Connections: High Risk (02/14/2022)   Received from [facility], [facility]   Social Connections    How often do you see or talk to people that you care about and feel close to? (For example: talking to friends on the phone, visiting friends or family, going to church or club meetings): Less than once a week    Hospital Course:  38 year old African American male who presented to the emergency department under involuntary commitment (IVC) initiated by his mother, Motty Borin [phone]). Per the IVC documentation, the patient has a history of schizophrenia and bipolar disorder, with recent threats to harm both himself and his mother. His mother reported that the patient has  been extremely aggressive, non-compliant with prescribed medications, and has been self-medicating with methamphetamine.The patient acknowledges an argument with his mother, which he states led to her contacting law enforcement. Upon admission, he demonstrates limited insight, impaired judgment, and disorganized thought patterns, consistent with his psychiatric history. He also expressed feelings of frustration and anger toward hospital staff and his mother, citing ongoing stress about his unstable living situation.   During hospitalization, the  patient was placed onQ 15 minute  monitoring and received treatment including medication management, group therapy, and recreation therapy. The patient intermittently participated in unit programming and was compliant with treatment.  Pharmacologic management included resuming Haldol  and initiation of Abilify  Maintena LAI. The patient showed positive response to medications, reduced hallucinations, improved thought process. Side effects observed included: none.  Throughout the hospital course, the patient's psychotic symptoms, mood lability, insight, and judgement improved. The patient demonstrated progress in coping skills, social interactions, and adherence with treatment recommendations.  By discharge, the patient exhibited stable mood, improved thought processes, and resolution of acute symptoms, and was deemed appropriate for discharge to home with his sister. The patient was educated on medication adherence, relapse prevention, crisis management and participated in safety planning.  Physical Findings: AIMS: Facial and Oral Movements Muscles of Facial Expression: None Lips and Perioral Area: None Jaw: None Tongue: None,Extremity Movements Upper (arms, wrists, hands, fingers): None Lower (legs, knees, ankles, toes): None, Trunk Movements Neck, shoulders, hips: None, Global Judgements Severity of abnormal movements overall : None Incapacitation due to abnormal movements: None Patient's awareness of abnormal movements: No Awareness, Dental Status Current problems with teeth and/or dentures?: No Does patient usually wear dentures?: No Edentia?: No  CIWA:    COWS:     Musculoskeletal: Strength & Muscle Tone: within normal limits Gait & Station: normal Patient leans: N/A   Psychiatric Specialty Exam:  Presentation  General Appearance:  Appropriate for Environment  Eye Contact: Good  Speech: Normal Rate  Speech Volume: Normal  Handedness: Right   Mood and Affect   Mood: Euthymic  Affect: Constricted   Thought Process  Thought Processes: Coherent  Descriptions of Associations:Circumstantial  Orientation:Full (Time, Place and Person)  Thought Content:Logical  History of Schizophrenia/Schizoaffective disorder:Yes  Duration of Psychotic Symptoms:Greater than six months  Hallucinations:No data recorded Ideas of Reference:None  Suicidal Thoughts:No data recorded Homicidal Thoughts:No data recorded  Sensorium  Memory: Immediate Fair; Recent Fair; Remote Fair  Judgment: Fair  Insight: Fair   Art Therapist  Concentration: Fair  Attention Span: Good  Recall: Good  Fund of Knowledge: Good  Language: Good   Psychomotor Activity  Psychomotor Activity:Normal  Assets  Assets: Social support, communication skills, housing   Sleep  Sleep:Good   Physical Exam: Physical Exam Vitals and nursing note reviewed.    Review of Systems  Psychiatric/Behavioral:  Negative for depression, hallucinations and suicidal ideas. The patient is not nervous/anxious and does not have insomnia.   All other systems reviewed and are negative.  Blood pressure 123/78, pulse 100, temperature 98.2 F (36.8 C), resp. rate 16, height 6' 3 (1.905 m), weight 97.5 kg, SpO2 99%. Body mass index is 26.87 kg/m.   Social History   Tobacco Use  Smoking Status Former   Types: Cigarettes  Smokeless Tobacco Never  Tobacco Comments   Patient denies using tobacco products (03/13/22)   Tobacco Cessation:  N/A, patient does not currently use tobacco products   Blood Alcohol  level:  Lab Results  Component Value Date   ETH <10 05/02/2023  ETH <10 07/03/2022    Metabolic Disorder Labs:  Lab Results  Component Value Date   HGBA1C 6.4 (H) 05/02/2023   MPG 136.98 05/02/2023   MPG 116.89 03/04/2022   No results found for: PROLACTIN Lab Results  Component Value Date   CHOL 139 05/05/2023   TRIG 108 05/05/2023   HDL 40 (L)  05/05/2023   CHOLHDL 3.5 05/05/2023   VLDL 22 05/05/2023   LDLCALC 77 05/05/2023   LDLCALC 92 03/13/2022    See Psychiatric Specialty Exam and Suicide Risk Assessment completed by Attending Physician prior to discharge.  Discharge destination:  Home  Is patient on multiple antipsychotic therapies at discharge:  No   Has Patient had three or more failed trials of antipsychotic monotherapy by history:  No  Recommended Plan for Multiple Antipsychotic Therapies: NA  Discharge Instructions     Diet general   Complete by: As directed    Increase activity slowly   Complete by: As directed       Allergies as of 05/08/2023       Reactions   Bactrim [sulfamethoxazole-trimethoprim] Anaphylaxis   Shellfish Allergy Hives        Medication List     STOP taking these medications    amoxicillin -clavulanate 875-125 MG tablet Commonly known as: AUGMENTIN    chlorhexidine 0.12 % solution Commonly known as: PERIDEX   naproxen  250 MG tablet Commonly known as: NAPROSYN        TAKE these medications      Indication  benztropine  1 MG tablet Commonly known as: COGENTIN  Take 1 tablet (1 mg total) by mouth daily.  Indication: Extrapyramidal Reaction caused by Medications   Descovy  200-25 MG tablet Generic drug: emtricitabine -tenofovir  AF Take 1 tablet by mouth daily.  Indication: HIV Disease   ethambutol  400 MG tablet Commonly known as: MYAMBUTOL  Take 3 tablets (1,200 mg total) by mouth daily.  Indication: HIV   gabapentin  300 MG capsule Commonly known as: Neurontin  Take 1 capsule (300 mg total) by mouth 3 (three) times daily.  Indication: anxiety   haloperidol  2 MG tablet Commonly known as: HALDOL  Take 1 tablet (2 mg total) by mouth 2 (two) times daily. What changed:  medication strength how much to take  Indication: Psychosis   melatonin 5 MG Tabs Take 1 tablet (5 mg total) by mouth at bedtime.  Indication: Trouble Sleeping   rifabutin  150 MG  capsule Commonly known as: MYCOBUTIN  Take 2 capsules (300 mg total) by mouth daily.  Indication: HIV   Tivicay  50 MG tablet Generic drug: dolutegravir  Take 1 tablet (50 mg total) by mouth daily.  Indication: HIV Disease   traZODone  50 MG tablet Commonly known as: DESYREL  Take 1 tablet (50 mg total) by mouth at bedtime as needed for sleep. What changed:  medication strength how much to take when to take this reasons to take this  Indication: Trouble Sleeping        Follow-up Information     [facility]. Go to.   Why: In person appointment 05/21/23 at 9 AM. Contact information: [address] [place] [address] [phone]                 Follow-up recommendations:   - It is recommended to the patient to continue psychiatric medications as prescribed, after discharge from the hospital.   - It is recommended to the patient to follow up with your outpatient psychiatric provider and PCP. - It was discussed with the patient, the impact of  alcohol , drugs, tobacco have been there overall psychiatric and medical wellbeing, and total abstinence from substance use was recommended the patient. - Prescriptions provided or sent directly to preferred pharmacy at discharge. Patient agreeable to plan. Given opportunity to ask questions. Appears to feel comfortable with discharge.   - In the event of worsening symptoms, the patient is instructed to call the crisis hotline, 911 and or go to the nearest ED for appropriate evaluation and treatment of symptoms. To follow-up with primary care provider for other medical issues, concerns and or health care needs - Patient was discharged home with a plan to follow up as noted  Signed: Zaidy Absher FORBES OCEAN, NP

## 2023-11-15 NOTE — Consults (Signed)
 INITIAL INFECTIOUS DISEASE CONSULT NOTE   NAME: Michael Chandler MRN: [id number] DOB: 08-07-1985  CONSULT REASON: Fevers in patient with HIV, non compliant, CD4 count at 11,  REQUESTING PHYSICIAN: Aqeel, Hilbert Rivet, MD    HISTORY OF PRESENT ILLNESS   Michael Chandler is a 38 y.o. male with PMH of HIV with AIDS, CD4 count of 11, schizophrenia, OCD, methamphetamine abuse (smokes), homelessness, chronic noncompliance w/ medication regimen, chronic neck and abdominal pain in whom ID is asked to evaluate for fevers in HIV patient with AIDS (CD4 11).  Patient seen and examined at bedside. He reports he was diagnosed with HIV in 2012, has been on few regimens in the past. He was on multiple medication regimen each time and reports it is hard to remember to take it. Patient is homeless reporting financial burden to keep up with follow ups and obtaining medications. He was last on medications about a year ago; he believes he was on descovy  + something else. He presented to ED on 7/19 for acute chest pain that radiated to left arm and right shoulder along with fatigue and general malaise. Reports these symptoms have resolved. Does admit to headaches that have started after LP and diarrhea that is also improving since admission. Also admits to chronic abdominal cramping present for months along with weight loss about 30 pounds. Denies fevers, chills, vision changes, neck pain, ear pain, difficulty swallowing, rash, and dysuria, change in urinary frequency. Patient admits to IV drug use. He also reports skin wound on his left forearm proximal to his wrist area that is covered with band-aid, there is beefy red tissue, no pus drainage but patient does admit about a month back it was draining stuff but has improved significantly. Reports the wound is decreasing in size in this month.   Per chart review, patient was febrile to 103F, tachycardic at 105, with elevated RR 24 in the ED;  Work up on arrival revealed no  leukocytosis or thrombosis, Na 132, Cr stable, AST 46, lactate and procal wnl. CXR and CT chest with no acute abnormalities; CT head with no acute abnormalities but CT abdomen/pelvis showed terminal ileitis.    Past medical and surgical history reviewed and as below. No previous ID notes on our epic. It appears he has had numerous ED visits this year at different ED. Admits to cervical laminectomy years ago, no other hardware such as ICD, pacemaker, replaced valves, etc. Does admit to bactrim/sulfa allergy- reports hives. Unsure of family history. Reports no sexual activity in the recent years, no new partners.   PAST MEDICAL HISTORY   Past Medical History:  Diagnosis Date   Hepatitis    HIV (human immunodeficiency virus infection) (HCC)    Schizophrenia (HCC)    Syphilis     PAST SURGICAL HISTORY   Past Surgical History:  Procedure Laterality Date   CERVICAL LAMINECTOMY  03/11/2020   NO PAST SURGERIES      SOCIAL HISTORY   Social History   Socioeconomic History   Marital status: Single    Spouse name: None   Number of children: None   Years of education: None   Highest education level: None  Occupational History   None  Tobacco Use   Smoking status: Former    Types: Cigarettes   Smokeless tobacco: Never  Vaping Use   Vaping status: Never Used  Substance and Sexual Activity   Alcohol  use: Not Currently   Drug use: Yes    Frequency: 1.0 times per week  Types: Methamphetamines, Marijuana    Comment: last used 04/24/2020- smoked   Sexual activity: Not Currently  Other Topics Concern   None  Social History Narrative   None   Social Drivers of Health   Financial Resource Strain: Low Risk  (11/13/2023)   Overall Financial Resource Strain (CARDIA)    Difficulty of Paying Living Expenses: Not hard at all  Food Insecurity: High Risk (11/13/2023)   Food Insecurity    Worried About Programme Researcher, Broadcasting/film/video in the Last Year: Sometimes true    Ran Out of  Food in the Last Year: Sometimes true  Transportation Needs: Low Risk  (11/13/2023)   Transportation    Lack of Transportation (Medical): No    Lack of Transportation (Non-Medical): No  Housing Stability: High Risk (11/13/2023)   Housing Stability    Homeless in the Last Year: Yes    Unable to Pay for Housing in the Last Year: Yes    Number of Times Moved in the Last Year: Not on file     FAMILY HISTORY  History reviewed. No pertinent family history.  ALLERGIES   Allergies  Allergen Reactions   Almond Itching   Peanut Hives   Shellfish Containing Products Hives   Sulfa (Sulfonamide Antibiotics) Itching   Bactrim [Sulfamethoxazole-Trimethoprim] Rash    HOME MEDICATIONS   Medications Prior to Admission  Medication Sig Dispense Refill Last Dose/Taking   albuterol HFA inhaler 90 mcg/actuation Inhale 2 puffs into the lungs.      ARIPiprazole  (ABILIFY ) 10 MG tablet Take 1 tablet (10 mg total) by mouth nightly. 30 tablet 3    atovaquone  (MEPRON ) 750 mg/5 mL suspension Take 10 mLs (1,500 mg total) by mouth.      benztropine  (COGENTIN ) 1 MG tablet Take 1 tablet (1 mg total) by mouth daily.      bictegrav-emtricit-tenofov ala (BIKTARVY ) 50-200-25 mg Tab 25 kg or greater Take 1 tablet by mouth.      dapsone  25 MG tablet Take 100 mg by mouth daily.      darunavir -cobi-emtri-tenof ala (SYMTUZA ) 800-150-200-10 mg Tab Take 1 tablet by mouth daily. 90 tablet 0    dicyclomine  (BENTYL ) 20 mg tablet Take 1 tablet (20 mg total) by mouth 4 (four) times daily As needed for abdominal pain. 30 tablet 0    dolutegravir  (TIVICAY ) 50 mg Tab Take 1 tablet (50 mg total) by mouth daily. 21 tablet 0    emtricitabine -tenofovir  alafen (DESCOVY ) 200-25 mg tablet Take 1 tablet by mouth daily. 20 tablet 0    ethambutoL  (MYAMBUTOL ) 400 MG tablet Take 3 tablets (1,200 mg total) by mouth.      gabapentin  (NEURONTIN ) 100 MG capsule Take 1 capsule (100 mg total) by mouth.      hydrOXYzine   (ATARAX ) 25 MG tablet Take 1 tablet (25 mg total) by mouth every 8 (eight) hours as needed for Itching. 15 tablet 0    methocarbamoL (ROBAXIN) 500 MG tablet Take 1-2 tablets (500-1,000 mg total) by mouth.      rifabutin  (MYCOBUTIN ) 150 mg capsule Take 2 capsules (300 mg total) by mouth.      risperiDONE (RISPERDAL) 2 MG tablet Take 1 tablet (2 mg total) by mouth at bedtime. 30 tablet 0    traZODone  (DESYREL ) 150 MG tablet Take 1 tablet (150 mg total) by mouth at bedtime.       INPATIENT MEDICATIONS  benztropine , 1 mg, Oral, Daily cefTRIAXone  (ROCEPHIN ) IV, 2 g, IntraVENous, Q24H lactated ringers , 1,000 mL, IntraVENous, Once metroNIDAZOLE , 500  mg, Oral, Q8H risperiDONE, 1 mg, Oral, Nightly 0.9% sodium chloride  (NaCl), 5-10 mL, Intra-Catheter, Q12H   0.9% sodium chloride  (NaCl), Last Rate: 250 mL (11/13/23 2320)   0.9% sodium chloride  (NaCl), 250 mL, Continuous PRN  And 0.9% sodium chloride  (NaCl), 15 mL, PRN acetaminophen , 1,000 mg, Q6H PRN aspirin-acetaminophen -caffeine, 1 tablet, Q6H PRN sore throat, 1 lozenge, Q2H PRN calcium carbonate oral orderable, 500 mg, Q6H PRN dicyclomine , 20 mg, TID with meals PRN gabapentin , 100 mg, BID PRN guaiFENesin , 400 mg, Q6H PRN hydrOXYzine , 25 mg, Q8H PRN ibuprofen , 600 mg, Q6H PRN ipratropium-albuterol, 3 mL, Q6H PRN ondansetron , 4 mg, Q6H PRN polyethylene glycol powder, 17 g, Daily PRN 0.9% sodium chloride  (NaCl), 5-20 mL, PRN traZODone , 100 mg, Nightly PRN    REVIEW OF SYSTEMS  A complete review of systems was obtained, and found to be negative except for as stated in the HPI, including the following systems: constitutional, HEENT, cardiovascular, respiratory, gastrointestinal, genitourinary, hematologic, endocrine, dermatologic, and neurologic.    PHYSICAL EXAM  Temperature:  [97.3 F (36.3 C)-97.9 F (36.6 C)] 97.9 F (36.6 C) Pulse:  [64-69] 69 Resp:  [20] 20 BP: (91-114)/(64-79) 114/79 SpO2:  [100 %] 100 % (0-10) Pain  Rating: Activity:  [0] 0 (0-10) Pain Rating: Rest:  [3] 3    GENERAL: Alert, awake, in no acute distress HEENT: EOMI. No scleral icterus or injection, no oral lesions CARDIOVASCULAR: Regular rhythm, normal rate, no murmurs or extra heart sounds  PULMONARY: Clear to auscultation bilaterally, no wheezing or rhonchi. Good inspiratory effort. ABDOMEN: Soft, non-tender but admits to discomfort on palpation, non-distended.  EXTREMITIES: No edema, no cyanosis SKIN: left forearm wound proximal to wrist area, no drainage NEURO: speech and comprehension intact   LABORATORY DATA   Recent Labs    11/14/23 0615 11/15/23 0635  WBC 4.1* 3.5*  HGBAU 9.7* 9.7*  HCT 31.8* 31.5*  PLT 247 272   Recent Labs    11/12/23 1754 11/14/23 0615 11/15/23 0635  NA 132* 136 137  K 4.5 4.1 3.9  CL 102 109 107  CO2 22 19* 22  BUN 21 17 15   CREA 1.19 0.82 0.87  GLU 64* 87 109*  CA 8.4* 8.5* 8.7  TBIL 0.5  --  0.2  ALKP 84  --  74  SGOT 46*  --  24  SGPT 17  --  14  TPRO 9.0*  --  7.5  ALB 3.5  --  3.2*    Lab Results  Name Value Date/Time   Prothrombin Time (PT) 11.4 06/29/2019 0502   International Normalized Ratio (INR) 0.9 06/29/2019 0502   APTT 34.0 06/29/2019 0502   C-Reactive Protein  Date/Time Value Ref Range Status  11/12/2023 1754 37.5 (H) 0.0 - 3.2 mg/L Final  08/13/2017 0445 8.7 (H) 0.0 - 0.3 mg/dL Final   CULTURES & ANTIBIOTICS  CULTURES:  Lab Results  Component Value Date   BC No Growth at 48 hours. 11/12/2023   BC No Growth at 48 hours. 11/12/2023   Lab Results  Component Value Date   CLARI Clear 11/12/2023   COLOR Colorless 11/12/2023   RBCCF <2,000 (H) 11/12/2023   WBCCF 2 08/16/2017   SEGCF 2 08/16/2017   LYMCF 22 (L) 08/16/2017   MONCF 6 (L) 08/16/2017   CSFTP 25 11/12/2023   Lab Results  Component Value Date   CSFCU No Growth so far 11/12/2023   Lab Results  Component Value Date   AFSM No acid fast bacilli seen 11/12/2023  7/23 stool culture  pending 7/23 comprehensive stool pathogen:  Component Ref Range & Units (hover)     Adenovirus F40/41 Not Detected   Campylobacter species Detected Abnormal    Cryptosporidium species Not Detected   Entamoeba histolytica Not Detected   Shigella / Enteroinvasive E. coli Detected Abnormal    Enteroaggregative E. coli Not Detected   Enterotoxigenic E. coli Not Detected   Shiga toxin-producing E. coli Not Detected   Giardia lamblia Not Detected   Norovirus Not Detected   Rotavirus Not Detected   Salmonella species Not Detected   Vibrio species Not Detected   Yersinia enterocolitica Not Detected   C. difficile Toxin Gene(s), NAA Not Detected  Comment: Specimen tested using the [employer] platform, which detects the C. difficile toxin A and B genes.   Methodology, Molecular   This test is performed using the BioCode MDx 3000, an automated system that integrates PCR amplification, target capture, signal generation, and optical detection for multiple gastrointestinal pathogens in a single well. Appropriate positive and negative controls were run alongside the patient's sample.   Disclaimer   This test was validated and its performance determined by the [employer].  This test is approved by the U.S. Food and Drug Administration. This test is used for clinical purposes. It should not be regarded as investigational or for research. This laboratory is certified under the Clinical Laboratory Improvement Amendments of 1988 (CLIA) as qualified to perform high complexity clinical testing.  Resulting Agency [facility] <redacted file path>        Specimen Collected: 11/14/23 13:36 Last Resulted: 11/15/23 14:52   ANTIBIOTICS:  Cefepime  7/21 Ceftriaxone  7/21 - 7/24 Flagyl  7/23 - Present Vancomycin  7/21  DIAGNOSTICS   CT Chest w Contrast  Final Result    No acute abnormality to account for patient's symptoms.    ELECTRONICALLY SIGNED By: Lamar Finder   Date/Time 11/12/2023 9:20 PM    CT Abdomen Pelvis w Contrast  Final Result    Terminal ileitis. This may be of infectious or inflammatory etiology.    ELECTRONICALLY SIGNED By: Lamar Finder  Date/Time 11/12/2023 9:18 PM    CT Head wo Contrast  Final Result    No acute intracranial abnormality.      ELECTRONICALLY SIGNED By: Lamar Finder  Date/Time 11/12/2023 9:14 PM    XR Chest 1 View  Final Result  1. No acute cardiopulmonary disease.    ELECTRONICALLY SIGNED By: Cordella JINNY Drafts  Date/Time 11/12/2023 3:25 PM      ASSESSMENT   Principal Problem:   Systemic inflammatory response syndrome (HCC) Active Problems:   Fever   Generalized abdominal cramping   Neck pain, chronic   Methamphetamine abuse (CMS-HCC)   Schizophrenia (HCC)   Obsessive-compulsive disorder, unspecified   AIDS (acquired immunodeficiency syndrome), CD4 <=200 (HCC)   Noncompliance with medication regimen   Homelessness Resolved Problems:   * No resolved hospital problems. *  Patient is a 39 y.o. male with PMH of HIV with AIDS, CD4 count of 11, schizophrenia, OCD, methamphetamine abuse (smokes), homelessness, chronic noncompliance w/ medication regimen, chronic neck and abdominal pain in whom ID is asked to evaluate for fevers in HIV patient with AIDS (CD4 11).  # Sepsis on POA- resolved # Terminal Ileitis # Campylobacter and shigella positive on stool panel - reports abdominal cramping for months - fevers and tachycardia noted on admission - 7/21 CT A/P revealed terminal ileities - Agree with ceftriaxone  2gm Q24H  # HIV with AIDS -  7/21 CD4 11, 1 months ago 29, 1 yr ago 283 - CXR: No acute cardiopulmonary disease.  - 7/21 CT chest without contrast: No thoracic lymphadenopathy. The lungs are essentially clear. No pleural effusion. No acute osseous abnormality of the chest.  - 7/21 CT head: No acute intracranial abnormality.  - M/E panel negative  # Terminal ileitis - 7/21 CT A/P revealed:  Overt wall thickening involving the terminal ileum, with adjacent mild fat stranding.   Additional Diagnoses Complicating the Problem(s): # Homelessness # Noncompliance  RECOMMENDATIONS   - Patient with abdominal cramping for months along with diarrhea; stool panel positive for campylobacter and shigella. Continue ceftriaxone  2gm Q24H for now.   - Ordered QTB given fevers and reported weight loss.  - HIV drug resistance testing ordered. Patient is homeless and will benefit from state app- patient recommended to follow up with health department case worker for resources to enroll into state app.  - Started atovaquone  1500mg  once daily for PJP prophylaxis.  - CBC with diff, CMP for drug toxicity monitoring  - ID will follow  The plan of care was d/w with patient. Recommendations d/w the primary team Thank you for the consult.  I have personally provided >75 distinct minutes on the care of this patient today exclusive of any time spent on separately billable services and procedures.? This time includes preparing to see the patient, performing a medically appropriate exam, formulating plan of care, and documenting in the medical record. The following medically necessary activities further extended time: Obtaining/reviewing separately obtained history, Independently interpreting results and communicating results to the patient/family/caregiver, Communicating and/or coordinating care with other qualified health care providers , and Counseling/educating the patient, family, and/or caregiver(s) on the plan of care.   Michael Muck, DO Division of Infectious Diseases 11/15/2023  9:48 AM
